# Patient Record
Sex: Male | Born: 1975 | Race: White | Hispanic: No | Marital: Single | Smoking: Current every day smoker
Health system: Southern US, Community
[De-identification: ages and names within clinical notes are randomized; demographics above are authoritative.]

## PROBLEM LIST (undated history)

## (undated) DIAGNOSIS — R55 Syncope and collapse: Secondary | ICD-10-CM

## (undated) DIAGNOSIS — I5022 Chronic systolic (congestive) heart failure: Secondary | ICD-10-CM

## (undated) DIAGNOSIS — D649 Anemia, unspecified: Secondary | ICD-10-CM

## (undated) DIAGNOSIS — I255 Ischemic cardiomyopathy: Secondary | ICD-10-CM

## (undated) DIAGNOSIS — Z91199 Patient's noncompliance with other medical treatment and regimen due to unspecified reason: Secondary | ICD-10-CM

## (undated) DIAGNOSIS — I2510 Atherosclerotic heart disease of native coronary artery without angina pectoris: Secondary | ICD-10-CM

## (undated) DIAGNOSIS — I219 Acute myocardial infarction, unspecified: Secondary | ICD-10-CM

## (undated) DIAGNOSIS — Z720 Tobacco use: Secondary | ICD-10-CM

## (undated) DIAGNOSIS — I513 Intracardiac thrombosis, not elsewhere classified: Secondary | ICD-10-CM

## (undated) DIAGNOSIS — Z9119 Patient's noncompliance with other medical treatment and regimen: Secondary | ICD-10-CM

## (undated) DIAGNOSIS — E785 Hyperlipidemia, unspecified: Secondary | ICD-10-CM

## (undated) DIAGNOSIS — I4891 Unspecified atrial fibrillation: Secondary | ICD-10-CM

## (undated) DIAGNOSIS — K219 Gastro-esophageal reflux disease without esophagitis: Secondary | ICD-10-CM

## (undated) DIAGNOSIS — I340 Nonrheumatic mitral (valve) insufficiency: Secondary | ICD-10-CM

## (undated) DIAGNOSIS — I209 Angina pectoris, unspecified: Secondary | ICD-10-CM

## (undated) DIAGNOSIS — I639 Cerebral infarction, unspecified: Secondary | ICD-10-CM

## (undated) HISTORY — DX: Cerebral infarction, unspecified: I63.9

## (undated) HISTORY — PX: FRACTURE SURGERY: SHX138

## (undated) HISTORY — PX: OTHER SURGICAL HISTORY: SHX169

## (undated) SURGERY — Surgical Case
Anesthesia: *Unknown

---
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
## 2004-07-04 HISTORY — PX: TIBIA FRACTURE SURGERY: SHX806

## 2012-01-22 ENCOUNTER — Inpatient Hospital Stay (HOSPITAL_COMMUNITY)
Admission: EM | Admit: 2012-01-22 | Discharge: 2012-01-25 | DRG: 247 | Disposition: A | Discharge status: Home / Self Care | Payer: Medicaid Other | Attending: Cardiovascular Disease | Admitting: Cardiovascular Disease

## 2012-01-22 ENCOUNTER — Encounter (HOSPITAL_COMMUNITY): Payer: Self-pay | Admitting: *Deleted

## 2012-01-22 ENCOUNTER — Encounter (HOSPITAL_COMMUNITY)
Admission: EM | Disposition: A | Discharge status: Home / Self Care | Payer: Self-pay | Source: Home / Self Care | Attending: Cardiovascular Disease

## 2012-01-22 DIAGNOSIS — R030 Elevated blood-pressure reading, without diagnosis of hypertension: Secondary | ICD-10-CM | POA: Diagnosis present

## 2012-01-22 DIAGNOSIS — Z955 Presence of coronary angioplasty implant and graft: Secondary | ICD-10-CM

## 2012-01-22 DIAGNOSIS — E785 Hyperlipidemia, unspecified: Secondary | ICD-10-CM

## 2012-01-22 DIAGNOSIS — I2109 ST elevation (STEMI) myocardial infarction involving other coronary artery of anterior wall: Principal | ICD-10-CM

## 2012-01-22 DIAGNOSIS — I2510 Atherosclerotic heart disease of native coronary artery without angina pectoris: Secondary | ICD-10-CM

## 2012-01-22 DIAGNOSIS — Z8249 Family history of ischemic heart disease and other diseases of the circulatory system: Secondary | ICD-10-CM

## 2012-01-22 DIAGNOSIS — IMO0001 Reserved for inherently not codable concepts without codable children: Secondary | ICD-10-CM

## 2012-01-22 DIAGNOSIS — F17200 Nicotine dependence, unspecified, uncomplicated: Secondary | ICD-10-CM | POA: Diagnosis present

## 2012-01-22 DIAGNOSIS — Z720 Tobacco use: Secondary | ICD-10-CM

## 2012-01-22 HISTORY — PX: CORONARY ANGIOPLASTY WITH STENT PLACEMENT: SHX49

## 2012-01-22 HISTORY — DX: Hyperlipidemia, unspecified: E78.5

## 2012-01-22 HISTORY — PX: LEFT HEART CATHETERIZATION WITH CORONARY ANGIOGRAM: SHX5451

## 2012-01-22 HISTORY — DX: Atherosclerotic heart disease of native coronary artery without angina pectoris: I25.10

## 2012-01-22 HISTORY — PX: PERCUTANEOUS CORONARY STENT INTERVENTION (PCI-S): SHX5485

## 2012-01-22 HISTORY — DX: Tobacco use: Z72.0

## 2012-01-22 HISTORY — DX: Gastro-esophageal reflux disease without esophagitis: K21.9

## 2012-01-22 LAB — CBC
Hemoglobin: 14.6 g/dL (ref 13.0–17.0)
MCH: 34.4 pg — ABNORMAL HIGH (ref 26.0–34.0)
MCV: 96.2 fL (ref 78.0–100.0)
Platelets: 290 10*3/uL (ref 150–400)
RBC: 4.24 MIL/uL (ref 4.22–5.81)
WBC: 8.7 10*3/uL (ref 4.0–10.5)

## 2012-01-22 SURGERY — LEFT HEART CATHETERIZATION WITH CORONARY ANGIOGRAM
Anesthesia: LOCAL

## 2012-01-22 MED ORDER — HEPARIN (PORCINE) IN NACL 2-0.9 UNIT/ML-% IJ SOLN
INTRAMUSCULAR | Status: AC
  Filled 2012-01-22: qty 2000

## 2012-01-22 MED ORDER — FENTANYL CITRATE 0.05 MG/ML IJ SOLN
INTRAMUSCULAR | Status: AC
  Filled 2012-01-22: qty 2

## 2012-01-22 MED ORDER — LIDOCAINE HCL (PF) 1 % IJ SOLN
INTRAMUSCULAR | Status: AC
  Filled 2012-01-22: qty 30

## 2012-01-22 MED ORDER — VERAPAMIL HCL 2.5 MG/ML IV SOLN
INTRAVENOUS | Status: AC
  Filled 2012-01-22: qty 2

## 2012-01-22 MED ORDER — HEPARIN SODIUM (PORCINE) 1000 UNIT/ML IJ SOLN
INTRAMUSCULAR | Status: AC
  Filled 2012-01-22: qty 1

## 2012-01-22 MED ORDER — BIVALIRUDIN 250 MG IV SOLR
INTRAVENOUS | Status: AC
  Filled 2012-01-22: qty 250

## 2012-01-22 MED ORDER — MIDAZOLAM HCL 2 MG/2ML IJ SOLN
INTRAMUSCULAR | Status: AC
  Filled 2012-01-22: qty 2

## 2012-01-22 MED ORDER — TICAGRELOR 90 MG PO TABS
ORAL_TABLET | ORAL | Status: AC
  Filled 2012-01-22: qty 2

## 2012-01-22 MED ORDER — NITROGLYCERIN 0.2 MG/ML ON CALL CATH LAB
INTRAVENOUS | Status: AC
  Filled 2012-01-22: qty 1

## 2012-01-22 NOTE — ED Notes (Signed)
Pt arrived by [employer]  for code stemi. Received 4 ASA, 2 NItro, fentanyl iv pta. Pt was working outside all day, mid chest pain started at 1430, radiates down left arm and into jaw.
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
## 2012-01-22 NOTE — ED Notes (Addendum)
Taken to cath lab with RN present.

## 2012-01-23 ENCOUNTER — Encounter (HOSPITAL_COMMUNITY): Payer: Self-pay

## 2012-01-23 DIAGNOSIS — I214 Non-ST elevation (NSTEMI) myocardial infarction: Secondary | ICD-10-CM

## 2012-01-23 LAB — CARDIAC PANEL(CRET KIN+CKTOT+MB+TROPI)
CK, MB: 458.6 ng/mL (ref 0.3–4.0)
CK, MB: 258.4 ng/mL (ref 0.3–4.0)
CK, MB: 134.8 ng/mL (ref 0.3–4.0)
Total CK: 4477 U/L — ABNORMAL HIGH (ref 7–232)
Troponin I: >20 ng/mL (ref <0.30)

## 2012-01-23 LAB — CBC
MCH: 34.4 pg — ABNORMAL HIGH (ref 26.0–34.0)
MCHC: 35.5 g/dL (ref 30.0–36.0)
Platelets: 260 10*3/uL (ref 150–400)

## 2012-01-23 LAB — POCT I-STAT, CHEM 8
Hemoglobin: 13.9 g/dL (ref 13.0–17.0)
Sodium: 126 mEq/L — ABNORMAL LOW (ref 135–145)
TCO2: 16 mmol/L (ref 0–100)

## 2012-01-23 LAB — LIPID PANEL
Cholesterol: 183 mg/dL (ref 0–200)
Triglycerides: 87 mg/dL (ref <150)
VLDL: 17 mg/dL (ref 0–40)

## 2012-01-23 LAB — BASIC METABOLIC PANEL
BUN: 12 mg/dL (ref 6–23)
Calcium: 8.4 mg/dL (ref 8.4–10.5)
GFR calc Af Amer: >90 mL/min (ref >90)
GFR calc non Af Amer: >90 mL/min (ref >90)
Glucose, Bld: 122 mg/dL — ABNORMAL HIGH (ref 70–99)
Sodium: 137 mEq/L (ref 135–145)

## 2012-01-23 LAB — POCT ACTIVATED CLOTTING TIME: Activated Clotting Time: 359 seconds

## 2012-01-23 LAB — MRSA PCR SCREENING: MRSA by PCR: NEGATIVE

## 2012-01-23 MED ORDER — MORPHINE SULFATE 2 MG/ML IJ SOLN: 1.0000 mg | INTRAMUSCULAR | Status: DC | PRN

## 2012-01-23 MED ORDER — NITROGLYCERIN 0.4 MG SL SUBL: 0.4000 mg | SUBLINGUAL_TABLET | SUBLINGUAL | Status: DC | PRN

## 2012-01-23 MED ORDER — SODIUM CHLORIDE 0.9 % IV SOLN: INTRAVENOUS | Status: DC

## 2012-01-23 MED ORDER — ASPIRIN 81 MG PO CHEW
81.0000 mg | CHEWABLE_TABLET | Freq: Every day | ORAL | Status: DC
  Administered 2012-01-23 – 2012-01-25 (×3): 81 mg via ORAL
  Filled 2012-01-23 (×3): qty 1

## 2012-01-23 MED ORDER — ALPRAZOLAM 0.25 MG PO TABS: 0.2500 mg | ORAL_TABLET | Freq: Two times a day (BID) | ORAL | Status: DC | PRN

## 2012-01-23 MED ORDER — ATORVASTATIN CALCIUM 80 MG PO TABS
80.0000 mg | ORAL_TABLET | Freq: Every day | ORAL | Status: DC
  Administered 2012-01-23 – 2012-01-24 (×2): 80 mg via ORAL
  Filled 2012-01-23 (×3): qty 1

## 2012-01-23 MED ORDER — OXYCODONE-ACETAMINOPHEN 5-325 MG PO TABS: 1.0000 | ORAL_TABLET | ORAL | Status: DC | PRN

## 2012-01-23 MED ORDER — TICAGRELOR 90 MG PO TABS
90.0000 mg | ORAL_TABLET | Freq: Two times a day (BID) | ORAL | Status: DC
  Administered 2012-01-23 – 2012-01-25 (×5): 90 mg via ORAL
  Filled 2012-01-23 (×6): qty 1

## 2012-01-23 MED FILL — Dextrose Inj 5%: INTRAVENOUS | Qty: 50 | Status: AC

## 2012-01-23 NOTE — Progress Notes (Signed)
Utilization Review Completed.  Logan [name] T  01/23/2012  

## 2012-01-23 NOTE — Progress Notes (Signed)
CRITICAL VALUE ALERT  Critical value received:  Ckmb, troponin  Date of notification:  01/23/12  Time of notification:  0420  Critical value read back:yes  Nurse who received alert:  Lonny Prude   MD notified (1st page):  Dr Abelardo Diesel  Time of first page:  0430  MD notified (2nd page):  Time of second page:  Responding MD:    Time MD responded:
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
## 2012-01-23 NOTE — Progress Notes (Signed)
CARDIAC REHAB PHASE I   PRE:  Rate/Rhythm: 92 SR    BP: sitting 99/69    SaO2: 98 2L, 97 RA  MODE:  Ambulation: 190 ft   POST:  Rate/Rhythm: 102 ST    BP: sitting 101/61     SaO2: 98 RA  Pt sts he is ready to walk/get OOB. No VT in last 2 hrs, pt feels good. Vss. No c/o walking short distance. To recliner. Began ed with pt. 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 [phone]  Harriet Masson CES, ACSM

## 2012-01-24 LAB — DRUGS OF ABUSE SCREEN W/O ALC, ROUTINE URINE
Amphetamine Screen, Ur: NEGATIVE
Barbiturate Quant, Ur: NEGATIVE
Benzodiazepines.: NEGATIVE
Marijuana Metabolite: NEGATIVE
Methadone: NEGATIVE
Opiate Screen, Urine: NEGATIVE

## 2012-01-24 LAB — CARDIAC PANEL(CRET KIN+CKTOT+MB+TROPI)
CK, MB: 67.6 ng/mL (ref 0.3–4.0)
Relative Index: 6 — ABNORMAL HIGH (ref 0.0–2.5)
Total CK: 1125 U/L — ABNORMAL HIGH (ref 7–232)
Troponin I: >20 ng/mL (ref <0.30)

## 2012-01-24 MED FILL — Sodium Chloride IV Soln 0.9%: INTRAVENOUS | Qty: 1000 | Status: AC

## 2012-01-24 NOTE — Progress Notes (Signed)
CARDIAC REHAB PHASE I   PRE:  Rate/Rhythm: 76SR  BP:  Supine: 96/60  Sitting:   Standing:    SaO2: 98%RA  MODE:  Ambulation: 550 ft   POST:  Rate/Rhythem: 92SR  BP:  Supine:   Sitting: 100/60  Standing:    SaO2: 100%RA 1430-1505 Pt walked 550 ft with steady gait. Denied chest pain or dizziness. Education completed. Permission given to refer to [facility] Phase 2. Reenforced smoking cessation. Discussed watching sodium with low EF.  Duanne Limerick

## 2012-01-24 NOTE — Care Management Note (Signed)
    Page 1 of 1   01/24/2012     3:39:47 PM   CARE MANAGEMENT NOTE 01/24/2012  Patient:  Logan [name], Logan [name]   Account Number:  [id number]  Date Initiated:  01/24/2012  Documentation initiated by:  Alvira Philips Assessment:   36 yr-old male adm with dx of STEMI; lives alone, independent PTA     Action/Plan:   Anticipated DC Date:  01/25/2012   Anticipated DC Plan:  HOME/SELF CARE      DC Planning Services  CM consult  Medication Assistance  PCP issues      Choice offered to / List presented to:             Status of service:  Completed, signed off Medicare Important Message given?   (If response is "NO", the following Medicare IM given date fields will be blank) Date Medicare IM given:   Date Additional Medicare IM given:    Discharge Disposition:  HOME/SELF CARE  Per UR Regulation:  Reviewed for med. necessity/level of care/duration of stay  If discussed at Long Length of Stay Meetings, dates discussed:    Comments:  No PCP  01-24-12 1534 Tomi Bamberger, RN,BSN [phone] CM did call [facility] and medication is available . CM placed [place] and [place] form in chart. MD please fill out. No further services from CM at this time.  01/24/12 0920 Henrietta Mayo RN MSN CCM Pt is self-pay and is to d/c on Brilinta.  Provided card for free 30-day supply of Brilinta and application for pharmaceutical patient assistance program.  Pt will establish @ [facility] Dept for primary care - provided address and telephone number as well as number for assistance with medications.
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
## 2012-01-25 ENCOUNTER — Encounter (HOSPITAL_COMMUNITY): Payer: Self-pay | Admitting: Physician Assistant

## 2012-01-25 DIAGNOSIS — I2510 Atherosclerotic heart disease of native coronary artery without angina pectoris: Secondary | ICD-10-CM

## 2012-01-25 DIAGNOSIS — E785 Hyperlipidemia, unspecified: Secondary | ICD-10-CM

## 2012-01-25 MED ORDER — CARVEDILOL 3.125 MG PO TABS: 3.1250 mg | ORAL_TABLET | Freq: Two times a day (BID) | ORAL | Status: DC

## 2012-01-25 MED ORDER — ASPIRIN 81 MG PO CHEW: 81.0000 mg | CHEWABLE_TABLET | Freq: Every day | ORAL | Status: DC

## 2012-01-25 MED ORDER — TICAGRELOR 90 MG PO TABS: 90.0000 mg | ORAL_TABLET | Freq: Two times a day (BID) | ORAL | Status: DC

## 2012-01-25 MED ORDER — CARVEDILOL 3.125 MG PO TABS
3.1250 mg | ORAL_TABLET | Freq: Two times a day (BID) | ORAL | Status: DC
  Filled 2012-01-25 (×2): qty 1

## 2012-01-25 MED ORDER — NITROGLYCERIN 0.4 MG SL SUBL: 0.4000 mg | SUBLINGUAL_TABLET | SUBLINGUAL | Status: DC | PRN

## 2012-01-25 MED ORDER — ATORVASTATIN CALCIUM 80 MG PO TABS: 80.0000 mg | ORAL_TABLET | Freq: Every day | ORAL | Status: DC
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
## 2012-02-03 ENCOUNTER — Encounter: Payer: Self-pay | Admitting: Nurse Practitioner

## 2012-02-06 ENCOUNTER — Telehealth: Payer: Self-pay | Admitting: Nurse Practitioner

## 2012-02-06 NOTE — Telephone Encounter (Signed)
I received a phone call from Dr. Verdell Carmine in [place], [place] (cardiology). He called to give an update on the referenced patient. He was in [place] visiting friends and had recurrent chest pain. He thrombosed his stent that was just placed. EF is now down to 10%. He said he was taking his Brilinta but this may be questionable. He is still smoking. BP still too soft for ACE. Will see him on Friday as planned this week. Will need to give consideration for ICD 3 months post MI.

## 2012-02-10 ENCOUNTER — Encounter: Payer: Self-pay | Admitting: Nurse Practitioner

## 2012-02-15 ENCOUNTER — Encounter: Payer: Self-pay | Admitting: Nurse Practitioner

## 2012-02-15 ENCOUNTER — Ambulatory Visit (INDEPENDENT_AMBULATORY_CARE_PROVIDER_SITE_OTHER): Payer: Self-pay | Admitting: Nurse Practitioner

## 2012-02-15 VITALS — BP 96/70 | HR 77 | Ht 68.0 in | Wt 155.8 lb

## 2012-02-15 DIAGNOSIS — E785 Hyperlipidemia, unspecified: Secondary | ICD-10-CM

## 2012-02-15 DIAGNOSIS — F17200 Nicotine dependence, unspecified, uncomplicated: Secondary | ICD-10-CM

## 2012-02-15 DIAGNOSIS — Z720 Tobacco use: Secondary | ICD-10-CM

## 2012-02-15 DIAGNOSIS — I2510 Atherosclerotic heart disease of native coronary artery without angina pectoris: Secondary | ICD-10-CM

## 2012-02-15 DIAGNOSIS — IMO0001 Reserved for inherently not codable concepts without codable children: Secondary | ICD-10-CM

## 2012-02-15 DIAGNOSIS — R030 Elevated blood-pressure reading, without diagnosis of hypertension: Secondary | ICD-10-CM

## 2012-02-15 MED ORDER — PRAVASTATIN SODIUM 40 MG PO TABS: 40.0000 mg | ORAL_TABLET | Freq: Every evening | ORAL | Status: DC

## 2012-02-15 NOTE — Assessment & Plan Note (Addendum)
His BP is still too soft. Will just continue the low dose beta blocker. Hope to add ACE in the future. Repeat BP by me was only 90/70.

## 2012-02-15 NOTE — Assessment & Plan Note (Signed)
Cessation is strongly encouraged 

## 2012-02-15 NOTE — Assessment & Plan Note (Signed)
Patient has had recent STEMI with reinfarction due to thrombosis of his stent. EF is now down to 10%. He is still smoking. He is strongly encouraged to stop. We have discussed the significance of this diagnosis. May need ICD once we are 3 months out from his event. Samples of Brilinta are given today. I have switched him over to Pravachol 40 mg. He should be able to afford, especially since he is able to buy cigarettes. We will see him back later this month as planned with Dr. Elease Hashimoto. Patient is agreeable to this plan and will call if any problems develop in the interim.

## 2012-02-15 NOTE — Assessment & Plan Note (Signed)
I have switched him to Pravachol.

## 2012-02-15 NOTE — Patient Instructions (Addendum)
Start Pravachol 40 mg each night for your cholesterol  Stay on your other medicines  We will see you back on the 26th of August with Dr. Elease Hashimoto  Minimize your salt  We will consider a sleep study  Don't smoke  Call the [facility] Care office at [phone] if you have any questions, problems or concerns.
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
## 2012-02-27 ENCOUNTER — Encounter: Payer: Self-pay | Admitting: Cardiovascular Disease

## 2012-04-05 ENCOUNTER — Encounter: Payer: Self-pay | Admitting: Cardiovascular Disease

## 2012-04-05 ENCOUNTER — Ambulatory Visit (INDEPENDENT_AMBULATORY_CARE_PROVIDER_SITE_OTHER): Payer: Self-pay | Admitting: Cardiovascular Disease

## 2012-04-05 VITALS — BP 102/72 | HR 60 | Ht 68.0 in | Wt 152.4 lb

## 2012-04-05 DIAGNOSIS — I2510 Atherosclerotic heart disease of native coronary artery without angina pectoris: Secondary | ICD-10-CM

## 2012-04-05 DIAGNOSIS — I509 Heart failure, unspecified: Secondary | ICD-10-CM

## 2012-04-05 DIAGNOSIS — I5022 Chronic systolic (congestive) heart failure: Secondary | ICD-10-CM

## 2012-04-05 DIAGNOSIS — I2109 ST elevation (STEMI) myocardial infarction involving other coronary artery of anterior wall: Secondary | ICD-10-CM

## 2012-04-05 NOTE — Assessment & Plan Note (Signed)
Episodes of followup. He presented in July with an acute anterior wall myocardial infarction and was found to also have had an old inferior wall myocardial infarction. He had a stent placed at that time. Unfortunately he had subacute thrombosis and a second myocardial infarction while he was visiting in [place]. He had reexpansion of the stent. His ejection fraction at that time was 10-15%.  He's had a lot of symptoms of congestive heart failure since that time. He has significant shortness breath when he is outlined above along.  He's not been taking his Pravachol because he has not been to the doctor.  Quaveon has significant congestive heart failure and will likely need the resources available in the Advanced Heart Failure clinic. We will refer him to Dr. Gala Romney.  We'll get an echocardiogram the next several days.
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
## 2012-04-05 NOTE — Patient Instructions (Signed)
Your physician has requested that you have an echocardiogram. Echocardiography is a painless test that uses sound waves to create images of your heart. It provides your doctor with information about the size and shape of your heart and how well your heart's chambers and valves are working. This procedure takes approximately one hour. There are no restrictions for this procedure.  You have been referred to Dr Teressa Lower for Heart Failure Clinic.

## 2012-04-06 DIAGNOSIS — I5022 Chronic systolic (congestive) heart failure: Secondary | ICD-10-CM | POA: Insufficient documentation

## 2012-04-06 NOTE — Assessment & Plan Note (Signed)
Logan [name] has developed chronic systolic congestive heart failure. This is due to ischemic cardiomyopathy. He has had an anterior wall myocardial infarction with stenting of his LAD. Unfortunately, he had a subacute thrombosis of his LAD stent requiring repeat PCI when he was on vacation in [place].  According to him, his ejection fraction is now 10-50%.  We'll get an echocardiogram for further evaluation of his LV function. We had a long discussion about his medications. I've stressed to him how important it is related take his medications. He has had some problems affording his medications but I did point out that he still smokes cigarettes. I specifically told him that I suspected that he could afford his medications that he stop smoking.  I think that he will need the assistance of our advanced heart failure clinic. We will refer him to the heart failure clinic.

## 2012-04-12 ENCOUNTER — Other Ambulatory Visit (HOSPITAL_COMMUNITY): Payer: Self-pay

## 2012-04-22 ENCOUNTER — Encounter (HOSPITAL_COMMUNITY): Payer: Self-pay | Admitting: Emergency Medicine

## 2012-04-22 ENCOUNTER — Emergency Department (HOSPITAL_COMMUNITY): Payer: Medicaid Other

## 2012-04-22 ENCOUNTER — Inpatient Hospital Stay (HOSPITAL_COMMUNITY)
Admission: EM | Admit: 2012-04-22 | Discharge: 2012-04-27 | DRG: 312 | Disposition: A | Discharge status: Home / Self Care | Payer: Medicaid Other | Attending: Cardiovascular Disease | Admitting: Cardiovascular Disease

## 2012-04-22 ENCOUNTER — Observation Stay (HOSPITAL_COMMUNITY)
Admission: AD | Admit: 2012-04-22 | Discharge: 2012-04-22 | Disposition: A | Discharge status: Home / Self Care | Payer: Self-pay | Source: Ambulatory Visit | Attending: Interventional Cardiology | Admitting: Interventional Cardiology

## 2012-04-22 DIAGNOSIS — I2589 Other forms of chronic ischemic heart disease: Secondary | ICD-10-CM | POA: Diagnosis present

## 2012-04-22 DIAGNOSIS — E785 Hyperlipidemia, unspecified: Secondary | ICD-10-CM | POA: Diagnosis present

## 2012-04-22 DIAGNOSIS — I255 Ischemic cardiomyopathy: Secondary | ICD-10-CM | POA: Diagnosis present

## 2012-04-22 DIAGNOSIS — I2582 Chronic total occlusion of coronary artery: Secondary | ICD-10-CM | POA: Diagnosis present

## 2012-04-22 DIAGNOSIS — Z79899 Other long term (current) drug therapy: Secondary | ICD-10-CM

## 2012-04-22 DIAGNOSIS — Z7982 Long term (current) use of aspirin: Secondary | ICD-10-CM

## 2012-04-22 DIAGNOSIS — I513 Intracardiac thrombosis, not elsewhere classified: Secondary | ICD-10-CM | POA: Diagnosis present

## 2012-04-22 DIAGNOSIS — I5022 Chronic systolic (congestive) heart failure: Secondary | ICD-10-CM

## 2012-04-22 DIAGNOSIS — Z9119 Patient's noncompliance with other medical treatment and regimen: Secondary | ICD-10-CM

## 2012-04-22 DIAGNOSIS — R55 Syncope and collapse: Secondary | ICD-10-CM

## 2012-04-22 DIAGNOSIS — I252 Old myocardial infarction: Secondary | ICD-10-CM

## 2012-04-22 DIAGNOSIS — I5189 Other ill-defined heart diseases: Secondary | ICD-10-CM | POA: Diagnosis present

## 2012-04-22 DIAGNOSIS — Z23 Encounter for immunization: Secondary | ICD-10-CM

## 2012-04-22 DIAGNOSIS — I509 Heart failure, unspecified: Secondary | ICD-10-CM | POA: Diagnosis present

## 2012-04-22 DIAGNOSIS — F17200 Nicotine dependence, unspecified, uncomplicated: Secondary | ICD-10-CM | POA: Diagnosis present

## 2012-04-22 DIAGNOSIS — I059 Rheumatic mitral valve disease, unspecified: Secondary | ICD-10-CM

## 2012-04-22 DIAGNOSIS — R079 Chest pain, unspecified: Secondary | ICD-10-CM

## 2012-04-22 DIAGNOSIS — I519 Heart disease, unspecified: Secondary | ICD-10-CM

## 2012-04-22 DIAGNOSIS — K219 Gastro-esophageal reflux disease without esophagitis: Secondary | ICD-10-CM | POA: Diagnosis present

## 2012-04-22 DIAGNOSIS — Z720 Tobacco use: Secondary | ICD-10-CM | POA: Diagnosis present

## 2012-04-22 DIAGNOSIS — Z91199 Patient's noncompliance with other medical treatment and regimen due to unspecified reason: Secondary | ICD-10-CM

## 2012-04-22 DIAGNOSIS — I2510 Atherosclerotic heart disease of native coronary artery without angina pectoris: Secondary | ICD-10-CM

## 2012-04-22 DIAGNOSIS — F1010 Alcohol abuse, uncomplicated: Secondary | ICD-10-CM | POA: Diagnosis present

## 2012-04-22 HISTORY — DX: Chronic systolic (congestive) heart failure: I50.22

## 2012-04-22 HISTORY — DX: Patient's noncompliance with other medical treatment and regimen: Z91.19

## 2012-04-22 HISTORY — DX: Syncope and collapse: R55

## 2012-04-22 HISTORY — DX: Ischemic cardiomyopathy: I25.5

## 2012-04-22 HISTORY — DX: Intracardiac thrombosis, not elsewhere classified: I51.3

## 2012-04-22 HISTORY — DX: Acute myocardial infarction, unspecified: I21.9

## 2012-04-22 HISTORY — DX: Patient's noncompliance with other medical treatment and regimen due to unspecified reason: Z91.199

## 2012-04-22 LAB — POCT I-STAT, CHEM 8
BUN: 4 mg/dL — ABNORMAL LOW (ref 6–23)
Calcium, Ion: 1.16 mmol/L (ref 1.12–1.23)
Chloride: 109 mEq/L (ref 96–112)
Creatinine, Ser: 1.1 mg/dL (ref 0.50–1.35)
Glucose, Bld: 80 mg/dL (ref 70–99)
TCO2: 18 mmol/L (ref 0–100)

## 2012-04-22 LAB — CBC WITH DIFFERENTIAL/PLATELET
HCT: 37.6 % — ABNORMAL LOW (ref 39.0–52.0)
Hemoglobin: 13.3 g/dL (ref 13.0–17.0)
Lymphs Abs: 1.9 10*3/uL (ref 0.7–4.0)
Monocytes Absolute: 0.6 10*3/uL (ref 0.1–1.0)
Monocytes Relative: 5 % (ref 3–12)
Neutro Abs: 8 10*3/uL — ABNORMAL HIGH (ref 1.7–7.7)
Neutrophils Relative %: 75 % (ref 43–77)
RBC: 4 MIL/uL — ABNORMAL LOW (ref 4.22–5.81)

## 2012-04-22 LAB — BASIC METABOLIC PANEL
BUN: 6 mg/dL (ref 6–23)
CO2: 19 mEq/L (ref 19–32)
Chloride: 106 mEq/L (ref 96–112)
Creatinine, Ser: 0.87 mg/dL (ref 0.50–1.35)
GFR calc Af Amer: >90 mL/min (ref >90)
Glucose, Bld: 81 mg/dL (ref 70–99)
Potassium: 3.7 mEq/L (ref 3.5–5.1)

## 2012-04-22 LAB — POCT I-STAT TROPONIN I: Troponin i, poc: 0.11 ng/mL (ref 0.00–0.08)

## 2012-04-22 LAB — TROPONIN I
Troponin I: <0.3 ng/mL (ref <0.30)
Troponin I: <0.3 ng/mL (ref <0.30)

## 2012-04-22 LAB — ETHANOL: Alcohol, Ethyl (B): 43 mg/dL — ABNORMAL HIGH (ref 0–11)

## 2012-04-22 MED ORDER — PNEUMOCOCCAL VAC POLYVALENT 25 MCG/0.5ML IJ INJ
0.5000 mL | INJECTION | INTRAMUSCULAR | Status: AC
  Administered 2012-04-23: 0.5 mL via INTRAMUSCULAR
  Filled 2012-04-22: qty 0.5

## 2012-04-22 MED ORDER — HEPARIN BOLUS VIA INFUSION
1500.0000 [IU] | Freq: Once | INTRAVENOUS | Status: AC
  Administered 2012-04-22: 1500 [IU] via INTRAVENOUS
  Filled 2012-04-22: qty 1500

## 2012-04-22 MED ORDER — ASPIRIN 81 MG PO CHEW: 324.0000 mg | CHEWABLE_TABLET | ORAL | Status: AC

## 2012-04-22 MED ORDER — TICAGRELOR 90 MG PO TABS
90.0000 mg | ORAL_TABLET | Freq: Two times a day (BID) | ORAL | Status: DC
  Administered 2012-04-22 – 2012-04-27 (×11): 90 mg via ORAL
  Filled 2012-04-22 (×12): qty 1

## 2012-04-22 MED ORDER — ONDANSETRON HCL 4 MG/2ML IJ SOLN: 4.0000 mg | Freq: Four times a day (QID) | INTRAMUSCULAR | Status: DC | PRN

## 2012-04-22 MED ORDER — ASPIRIN 300 MG RE SUPP
300.0000 mg | RECTAL | Status: AC
  Filled 2012-04-22: qty 1

## 2012-04-22 MED ORDER — ACETAMINOPHEN 325 MG PO TABS: 650.0000 mg | ORAL_TABLET | ORAL | Status: DC | PRN

## 2012-04-22 MED ORDER — CARVEDILOL 3.125 MG PO TABS
3.1250 mg | ORAL_TABLET | Freq: Two times a day (BID) | ORAL | Status: DC
  Administered 2012-04-22 – 2012-04-27 (×10): 3.125 mg via ORAL
  Filled 2012-04-22 (×13): qty 1

## 2012-04-22 MED ORDER — HEPARIN (PORCINE) IN NACL 100-0.45 UNIT/ML-% IJ SOLN
1550.0000 [IU]/h | INTRAMUSCULAR | Status: DC
  Administered 2012-04-22: 1050 [IU]/h via INTRAVENOUS
  Administered 2012-04-23: 1350 [IU]/h via INTRAVENOUS
  Filled 2012-04-22 (×4): qty 250

## 2012-04-22 MED ORDER — ENOXAPARIN SODIUM 40 MG/0.4ML ~~LOC~~ SOLN
40.0000 mg | SUBCUTANEOUS | Status: DC
  Administered 2012-04-22: 40 mg via SUBCUTANEOUS
  Filled 2012-04-22: qty 0.4

## 2012-04-22 MED ORDER — NITROGLYCERIN 0.4 MG SL SUBL: 0.4000 mg | SUBLINGUAL_TABLET | SUBLINGUAL | Status: DC | PRN

## 2012-04-22 MED ORDER — SIMVASTATIN 40 MG PO TABS
40.0000 mg | ORAL_TABLET | Freq: Every day | ORAL | Status: DC
  Administered 2012-04-22 – 2012-04-27 (×6): 40 mg via ORAL
  Filled 2012-04-22 (×6): qty 1

## 2012-04-22 MED ORDER — ASPIRIN EC 81 MG PO TBEC
81.0000 mg | DELAYED_RELEASE_TABLET | Freq: Every day | ORAL | Status: DC
  Filled 2012-04-22: qty 1

## 2012-04-22 NOTE — ED Notes (Signed)
PER EMS: Patient found by family in bathroom. Pt was initially unresponsive. Pt became responsive in route. Given 325 aspirin via EMS. Patient cool, pale, and diaphoretic. Pt alert but lethargic at this time. Patient rates chest pain 1/10. NAD

## 2012-04-22 NOTE — Progress Notes (Addendum)
Echo with severe LV dysfunction.  There appears to be a mural thrombus along distal inferior/posterior wall. Patient is currently on ASA and Brilinta Will begin IV heparin. Decision will need to be made for long term anticoag (patient with noncompliance history, also ETOH use) High risk. Note that EtOH level on admit was 43.  Discussed with EP  May explain event of last night.
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
## 2012-04-22 NOTE — Progress Notes (Signed)
Subjective: No CP  No SOB  No dizziness Objective: Filed Vitals:   04/22/12 0300 04/22/12 0430 04/22/12 0459 04/22/12 0541  BP: 86/56 87/54 91/60  96/60  Pulse: 73 67 69 75  Temp:   97.7 F (36.5 C) 97.5 F (36.4 C)  TempSrc:   Oral Oral  Resp: 16 17 18 18   Height:    5\' 8"  (1.727 m)  SpO2: 100% 100% 100% 100%   Weight change:  No intake or output data in the 24 hours ending 04/22/12 0755  General: Alert, awake, oriented x3, in no acute distress Neck:  JVP is normal Heart: Regular rate and rhythm, without murmurs, rubs, gallops.  Lungs: Clear to auscultation.  No rales or wheezes. Exemities:  No edema.   Neuro: Grossly intact, nonfocal.  Tele:  SR   Lab Results: Results for orders placed during the hospital encounter of 04/22/12 (from the past 24 hour(s))  ETHANOL     Status: Abnormal   Collection Time   04/22/12  3:16 AM      Component Value Range   Alcohol, Ethyl (B) 43 (*) 0 - 11 mg/dL  TROPONIN I     Status: Normal   Collection Time   04/22/12  3:16 AM      Component Value Range   Troponin I <0.30  <0.30 ng/mL  CBC WITH DIFFERENTIAL     Status: Abnormal   Collection Time   04/22/12  3:16 AM      Component Value Range   WBC 10.6 (*) 4.0 - 10.5 K/uL   RBC 4.00 (*) 4.22 - 5.81 MIL/uL   Hemoglobin 13.3  13.0 - 17.0 g/dL   HCT 16.1 (*) 09.6 - 04.5 %   MCV 94.0  78.0 - 100.0 fL   MCH 33.3  26.0 - 34.0 pg   MCHC 35.4  30.0 - 36.0 g/dL   RDW 40.9  81.1 - 91.4 %   Platelets 222  150 - 400 K/uL   Neutrophils Relative 75  43 - 77 %   Neutro Abs 8.0 (*) 1.7 - 7.7 K/uL   Lymphocytes Relative 18  12 - 46 %   Lymphs Abs 1.9  0.7 - 4.0 K/uL   Monocytes Relative 5  3 - 12 %   Monocytes Absolute 0.6  0.1 - 1.0 K/uL   Eosinophils Relative 1  0 - 5 %   Eosinophils Absolute 0.1  0.0 - 0.7 K/uL   Basophils Relative 0  0 - 1 %   Basophils Absolute 0.0  0.0 - 0.1 K/uL  BASIC METABOLIC PANEL     Status: Normal   Collection Time   04/22/12  3:16 AM      Component Value Range     Sodium 138  135 - 145 mEq/L   Potassium 3.7  3.5 - 5.1 mEq/L   Chloride 106  96 - 112 mEq/L   CO2 19  19 - 32 mEq/L   Glucose, Bld 81  70 - 99 mg/dL   BUN 6  6 - 23 mg/dL   Creatinine, Ser 7.82  0.50 - 1.35 mg/dL   Calcium 8.9  8.4 - 95.6 mg/dL   GFR calc non Af Amer >90  >90 mL/min   GFR calc Af Amer >90  >90 mL/min  POCT I-STAT TROPONIN I     Status: Abnormal   Collection Time   04/22/12  3:23 AM      Component Value Range   Troponin i, poc 0.11 (*) 0.00 -  0.08 ng/mL   Comment NOTIFIED PHYSICIAN     Comment 3           POCT I-STAT, CHEM 8     Status: Abnormal   Collection Time   04/22/12  3:25 AM      Component Value Range   Sodium 143  135 - 145 mEq/L   Potassium 3.8  3.5 - 5.1 mEq/L   Chloride 109  96 - 112 mEq/L   BUN 4 (*) 6 - 23 mg/dL   Creatinine, Ser 1.61  0.50 - 1.35 mg/dL   Glucose, Bld 80  70 - 99 mg/dL   Calcium, Ion 0.96  0.45 - 1.23 mmol/L   TCO2 18  0 - 100 mmol/L   Hemoglobin 12.9 (*) 13.0 - 17.0 g/dL   HCT 40.9 (*) 81.1 - 91.4 %  TROPONIN I     Status: Normal   Collection Time   04/22/12  6:40 AM      Component Value Range   Troponin I <0.30  <0.30 ng/mL    Studies/Results: @[id number] @  Medications: Reviewed   Patient Active Hospital Problem List: 1.  Ischemic cardiomyopathy.  Patient suffered an anterior MI on January 22, 2012 here at [facility] and undergoing PCI to the pLAD. At that time his RCA was found to also be chronically occluded with collaterals from the left circumflex. Shortly thereafter, on 02/05/2012 he suffered a subacute stent thrombosis while in [place] and underwent repeat PCI to the pLAD. His LVEF was reportedly 10-15% after this MI.  On coreg.  Not on ACE I  BP marginal.   2.  Question syncope.  Events of last evening were unwitnessed.  Patient reports feeling fine yesterday.  No dizziness.  No CP  No palpitations.  Was going to BR  No straining.  Next thing remembers was in ambulance. WIll review with EP  Keep on  tele.   3.  HL  Keep on statin.  LOS: 0 days   Dietrich Pates 04/22/2012, 7:55 AM

## 2012-04-22 NOTE — Progress Notes (Signed)
  Echocardiogram 2D Echocardiogram has been performed.  Logan [name] 04/22/2012, 2:55 PM

## 2012-04-23 DIAGNOSIS — I2510 Atherosclerotic heart disease of native coronary artery without angina pectoris: Secondary | ICD-10-CM

## 2012-04-23 DIAGNOSIS — R079 Chest pain, unspecified: Secondary | ICD-10-CM

## 2012-04-23 DIAGNOSIS — R55 Syncope and collapse: Principal | ICD-10-CM

## 2012-04-23 LAB — CBC
Hemoglobin: 12.1 g/dL — ABNORMAL LOW (ref 13.0–17.0)
MCH: 32.9 pg (ref 26.0–34.0)
MCV: 94.6 fL (ref 78.0–100.0)
RBC: 3.68 MIL/uL — ABNORMAL LOW (ref 4.22–5.81)

## 2012-04-23 LAB — HEPARIN LEVEL (UNFRACTIONATED): Heparin Unfractionated: 0.25 IU/mL — ABNORMAL LOW (ref 0.30–0.70)

## 2012-04-23 MED ORDER — PATIENT'S GUIDE TO USING COUMADIN BOOK
Freq: Once | Status: AC
  Administered 2012-04-23: 18:00:00
  Filled 2012-04-23: qty 1

## 2012-04-23 MED ORDER — HEPARIN BOLUS VIA INFUSION
2000.0000 [IU] | Freq: Once | INTRAVENOUS | Status: AC
  Administered 2012-04-23: 2000 [IU] via INTRAVENOUS
  Filled 2012-04-23: qty 2000

## 2012-04-23 MED ORDER — WARFARIN SODIUM 5 MG PO TABS
5.0000 mg | ORAL_TABLET | Freq: Every day | ORAL | Status: DC
  Administered 2012-04-23: 5 mg via ORAL
  Filled 2012-04-23 (×2): qty 1

## 2012-04-23 MED ORDER — HEPARIN (PORCINE) IN NACL 100-0.45 UNIT/ML-% IJ SOLN
2000.0000 [IU]/h | INTRAMUSCULAR | Status: DC
  Administered 2012-04-23: 1650 [IU]/h via INTRAVENOUS
  Administered 2012-04-24 – 2012-04-26 (×5): 1850 [IU]/h via INTRAVENOUS
  Administered 2012-04-27: 2000 [IU]/h via INTRAVENOUS
  Filled 2012-04-23 (×11): qty 250

## 2012-04-23 MED ORDER — WARFARIN - PHARMACIST DOSING INPATIENT
Freq: Every day | Status: DC
  Administered 2012-04-24 – 2012-04-26 (×2)

## 2012-04-23 NOTE — Progress Notes (Signed)
PROGRESS NOTE  Subjective:   Logan [name] is a 36 yo with hx of CAD, noncompliance, smoking who has had 2 Myocardial infactions this year resulting in an ischemic CM and EF of 10-15%.  He presented to the hospital after falling in the bathroom.  He had been drinking alcohol.  He has been noncompliant with his medications.  Objective:    Vital Signs:   Temp:  [97 F (36.1 C)-97.9 F (36.6 C)] 97 F (36.1 C) (10/21 0450) Pulse Rate:  [64-70] 64  (10/21 0450) Resp:  [18] 18  (10/21 0450) BP: (82-98)/(45-58) 98/58 mmHg (10/21 0450) SpO2:  [96 %-98 %] 97 % (10/21 0450) Weight:  [152 lb 6.4 oz (69.128 kg)-154 lb 1.6 oz (69.899 kg)] 154 lb 1.6 oz (69.899 kg) (10/21 0450)  Last BM Date: 04/22/12   24-hour weight change: Weight change:   Weight trends: Filed Weights   04/22/12 1430 04/23/12 0450  Weight: 152 lb 6.4 oz (69.128 kg) 154 lb 1.6 oz (69.899 kg)    Intake/Output:  10/20 0701 - 10/21 0700 In: 1231.9 [P.O.:1140; I.V.:91.9] Out: 301 [Urine:301]     Physical Exam: BP 98/58  Pulse 64  Temp 97 F (36.1 C) (Oral)  Resp 18  Ht 5\' 8"  (1.727 m)  Wt 154 lb 1.6 oz (69.899 kg)  BMI 23.43 kg/m2  SpO2 97%  General: Vital signs reviewed and noted.   Head: Normocephalic, atraumatic.  Eyes: conjunctivae/corneas clear.  EOM's intact.   Throat: normal  Neck: Supple. Normal carotids. No JVD  Lungs:  Wheezes and rhonchi that partially clear with deep breaths.  Heart: Regular rate,  With normal  S1 S2. No murmurs, gallops or rubs  Abdomen:  Soft, non-tender, non-distended with normoactive bowel sounds. No hepatomegaly. No rebound/guarding. No abdominal masses.  Extremities: Distal pedal pulses are 2+ .  No edema.    Neurologic: A&O X3, CN II - XII are grossly intact. Motor strength is 5/5 in the all 4 extremities.  Psych: Responds to questions appropriately with normal affect.    Labs: BMET:  Basename 04/22/12 0325 04/22/12 0316  NA 143 138  K 3.8 3.7  CL 109 106  CO2 -- 19    GLUCOSE 80 81  BUN 4* 6  CREATININE 1.10 0.87  CALCIUM -- 8.9  MG -- --  PHOS -- --    Liver function tests: No results found for this basename: AST:2,ALT:2,ALKPHOS:2,BILITOT:2,PROT:2,ALBUMIN:2 in the last 72 hours No results found for this basename: LIPASE:2,AMYLASE:2 in the last 72 hours  CBC:  Basename 04/23/12 0014 04/22/12 0325 04/22/12 0316  WBC 6.3 -- 10.6*  NEUTROABS -- -- 8.0*  HGB 12.1* 12.9* --  HCT 34.8* 38.0* --  MCV 94.6 -- 94.0  PLT 210 -- 222    Cardiac Enzymes:  Basename 04/22/12 1620 04/22/12 1040 04/22/12 0640 04/22/12 0316  CKTOTAL -- -- -- --  CKMB -- -- -- --  TROPONINI <0.30 <0.30 <0.30 <0.30     Tele:  Sinus brady, he had 1 asymptomatic pause.  Echo revealed a thrombus in the Lv.   Medications:    Infusions:    . heparin 1,350 Units/hr (04/23/12 0242)    Scheduled Medications:    . aspirin  324 mg Oral NOW   Or  . aspirin  300 mg Rectal NOW  . aspirin EC  81 mg Oral Daily  . carvedilol  3.125 mg Oral BID WC  . heparin  1,500 Units Intravenous Once  . heparin  2,000 Units Intravenous  Once  . pneumococcal 23 valent vaccine  0.5 mL Intramuscular Tomorrow-1000  . simvastatin  40 mg Oral q1800  . Ticagrelor  90 mg Oral BID  . DISCONTD: enoxaparin (LOVENOX) injection  40 mg Subcutaneous Q24H    Assessment/ Plan:   . 1. Ischemic cardiomyopathy:  Due to 2 MIs.  Unfortunately he does not always take his medications.  His prognosis is poor.  His BP is too low to start ACE-inhibitor at this point.   Will have him ambulate today.  I have discussed all of these issues.  I had stressed the importance of taking his medications and the risks of not taking them.    2. Possible syncope.   There has been no evidence that this was an arrythmia.  He was likely  Intoxicated  3. LV thrombus:  Currently on heparin drip.  We will need to try to maintain him on coumadin but his ETOH habits and his history of medical noncompliance are very  worrisome .   I explained all of these issues with him.  . Start coumadin today.  Disposition: ambulate, home in several days if stable  Length of Stay: 1  [name], [name]., MD, [facility] 04/23/2012, 8:27 AM Office [phone] Pager [phone]
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
## 2012-04-23 NOTE — Progress Notes (Addendum)
ANTICOAGULATION CONSULT NOTE   Pharmacy Consult for Heparin / Coumadin Indication: mural thrombus  Allergies  Allergen Reactions  . Bee Venom Anaphylaxis  . Penicillins Rash    Labs:  Basename 04/23/12 0014 04/22/12 1620 04/22/12 1040 04/22/12 0640 04/22/12 0325 04/22/12 0316  HGB 12.1* -- -- -- 12.9* --  HCT 34.8* -- -- -- 38.0* 37.6*  PLT 210 -- -- -- -- 222  APTT -- -- -- -- -- --  LABPROT -- -- -- -- -- --  INR -- -- -- -- -- --  HEPARINUNFRC 0.12* -- -- -- -- --  CREATININE -- -- -- -- 1.10 0.87  CKTOTAL -- -- -- -- -- --  CKMB -- -- -- -- -- --  TROPONINI -- <0.30 <0.30 <0.30 -- --    Estimated Creatinine Clearance: 89.8 ml/min (by C-G formula based on Cr of 1.1).  Assessment: 36 yo male with mural thrombus on ECHO for Heparin  Beginning Coumadin today.  Heparin level = 0.25  Goal of Therapy:  Heparin level 0.3-0.7 units/ml Monitor platelets by anticoagulation protocol: Yes INR = 2 to 3   Plan: 1) Increase heparin to 1550 units / hr 2) Coumadin 5 mg po daily at 1800 3) 6 hour heparin level 3) Daily INR  Thank you. Okey Regal, PharmD [phone]   04/23/2012 8:51 AM

## 2012-04-23 NOTE — Progress Notes (Signed)
Pt has 3.3 sec pause, pt asleep, asymptomatic.  Pt HR in 50's and Coreg 3.125 scheduled at 0800.  MD notified with call back.  MD states ok to give Coreg and MD aware of 3.3 sec pause.  Will con't to monitor and notify MD of any changes.

## 2012-04-23 NOTE — Progress Notes (Signed)
Utilization Review Completed.  

## 2012-04-23 NOTE — Progress Notes (Signed)
ANTICOAGULATION CONSULT NOTE   Pharmacy Consult for Heparin Indication: mural thrombus  Allergies  Allergen Reactions  . Bee Venom Anaphylaxis  . Penicillins Rash    Patient Measurements: Height: 5\' 8"  (172.7 cm) Weight: 152 lb 6.4 oz (69.128 kg) IBW/kg (Calculated) : 68.4   Vital Signs: Temp: 97.1 F (36.2 C) (10/20 2058) Temp src: Oral (10/20 2058) BP: 96/50 mmHg (10/20 2058) Pulse Rate: 70  (10/20 2058)  Labs:  Basename 04/23/12 0014 04/22/12 1620 04/22/12 1040 04/22/12 0640 04/22/12 0325 04/22/12 0316  HGB 12.1* -- -- -- 12.9* --  HCT 34.8* -- -- -- 38.0* 37.6*  PLT 210 -- -- -- -- 222  APTT -- -- -- -- -- --  LABPROT -- -- -- -- -- --  INR -- -- -- -- -- --  HEPARINUNFRC 0.12* -- -- -- -- --  CREATININE -- -- -- -- 1.10 0.87  CKTOTAL -- -- -- -- -- --  CKMB -- -- -- -- -- --  TROPONINI -- <0.30 <0.30 <0.30 -- --    Estimated Creatinine Clearance: 89.8 ml/min (by C-G formula based on Cr of 1.1).  Assessment: 36 yo male with mural thrombus on ECHO for Heparin  Goal of Therapy:  Heparin level 0.3-0.7 units/ml Monitor platelets by anticoagulation protocol: Yes   Plan:  Heparin 2000 units IV bolus, then increase heparin 1350 units.hr Follow-up am labs.  Geannie Risen, PharmD, BCPS  04/23/2012 1:29 AM

## 2012-04-23 NOTE — Progress Notes (Signed)
ANTICOAGULATION CONSULT NOTE: HEPARIN Indication: Mural thrombus  Heparin level = 0.29 on 1550 units/hr Goal heparin level = 0.3-0.7  Heparin level is slightly subtherapeutic. Will increase the rate of the heparin to 1650 units/hr to move heparin level into the therapeutic range. Continue daily AM heparin level and CBC.  Cardell Peach, PharmD

## 2012-04-24 DIAGNOSIS — I509 Heart failure, unspecified: Secondary | ICD-10-CM

## 2012-04-24 DIAGNOSIS — I5022 Chronic systolic (congestive) heart failure: Secondary | ICD-10-CM

## 2012-04-24 LAB — CBC
HCT: 38.6 % — ABNORMAL LOW (ref 39.0–52.0)
Hemoglobin: 13.6 g/dL (ref 13.0–17.0)
MCV: 94.8 fL (ref 78.0–100.0)
RDW: 12.6 % (ref 11.5–15.5)
WBC: 9.3 10*3/uL (ref 4.0–10.5)

## 2012-04-24 LAB — PROTIME-INR: INR: 0.98 (ref 0.00–1.49)

## 2012-04-24 LAB — HEPARIN LEVEL (UNFRACTIONATED): Heparin Unfractionated: 0.59 IU/mL (ref 0.30–0.70)

## 2012-04-24 MED ORDER — HEPARIN BOLUS VIA INFUSION
3000.0000 [IU] | Freq: Once | INTRAVENOUS | Status: AC
  Administered 2012-04-24: 3000 [IU] via INTRAVENOUS
  Filled 2012-04-24: qty 3000

## 2012-04-24 MED ORDER — WARFARIN SODIUM 7.5 MG PO TABS
7.5000 mg | ORAL_TABLET | Freq: Once | ORAL | Status: AC
  Administered 2012-04-24: 7.5 mg via ORAL
  Filled 2012-04-24: qty 1

## 2012-04-24 NOTE — Progress Notes (Signed)
ANTICOAGULATION CONSULT NOTE   Pharmacy Consult for Heparin / Coumadin Indication: mural thrombus  Allergies  Allergen Reactions  . Bee Venom Anaphylaxis  . Penicillins Rash    Labs:  Basename 04/24/12 0845 04/24/12 0610 04/23/12 1726 04/23/12 0835 04/23/12 0014 04/22/12 1620 04/22/12 1040 04/22/12 0640 04/22/12 0325 04/22/12 0316  HGB -- 13.6 -- -- 12.1* -- -- -- -- --  HCT -- 38.6* -- -- 34.8* -- -- -- 38.0* --  PLT -- 218 -- -- 210 -- -- -- -- 222  APTT -- -- -- -- -- -- -- -- -- --  LABPROT -- 12.9 -- 14.6 -- -- -- -- -- --  INR -- 0.98 -- 1.16 -- -- -- -- -- --  HEPARINUNFRC 0.17* <0.10* 0.29* -- -- -- -- -- -- --  CREATININE -- -- -- -- -- -- -- -- 1.10 0.87  CKTOTAL -- -- -- -- -- -- -- -- -- --  CKMB -- -- -- -- -- -- -- -- -- --  TROPONINI -- -- -- -- -- <0.30 <0.30 <0.30 -- --    Estimated Creatinine Clearance: 89.3 ml/min (by C-G formula based on Cr of 1.1).  Assessment: 36 yo male with mural thrombus on ECHO for Heparin  Day 2 of Heparin / Coumadin  Heparin level = 0.17  Goal of Therapy:  Heparin level 0.3-0.7 units/ml Monitor platelets by anticoagulation protocol: Yes INR = 2 to 3   Plan: 1) Heparin 3000 units iv bolus x 1, drip to 1850 units/hr 2) Coumadin 7.5 mg po x 1 today 3) 6 hour heparin level 3) Daily INR  Thank you. Okey Regal, PharmD [phone]   04/24/2012 9:42 AM

## 2012-04-24 NOTE — Progress Notes (Signed)
ANTICOAGULATION CONSULT NOTE Heparin Indication: mural thrombus  Heparin level = 0.59 on 1850 units/hr Goal heparin level = 0.3-0.7  Heparin level is within desired range. Continue same rate of 1850 units/hr. Resume daily AM heparin level and CBC.  Cardell Peach, PharmD

## 2012-04-24 NOTE — Progress Notes (Signed)
PROGRESS NOTE  Subjective:   Niraj is a 36 yo with hx of CAD, noncompliance, smoking who has had 2 Myocardial infactions this year resulting in an ischemic CM and EF of 10-15%.  He presented to the hospital after falling in the bathroom.  He had been drinking alcohol.  He has been noncompliant with his medications.  He has had no further episodes of syncope.  No CP or dyspnea.  Objective:    Vital Signs:   Temp:  [97 F (36.1 C)-99.2 F (37.3 C)] 98.1 F (36.7 C) (10/22 0447) Pulse Rate:  [62-79] 62  (10/22 0447) Resp:  [18-20] 20  (10/22 0447) BP: (96-115)/(58-74) 99/58 mmHg (10/22 0447) SpO2:  [97 %-99 %] 98 % (10/22 0447) Weight:  [149 lb 14.6 oz (68 kg)] 149 lb 14.6 oz (68 kg) (10/22 0447)  Last BM Date: 04/22/12   24-hour weight change: Weight change: -2 lb 7.8 oz (-1.128 kg)  Weight trends: Filed Weights   04/22/12 1430 04/23/12 0450 04/24/12 0447  Weight: 152 lb 6.4 oz (69.128 kg) 154 lb 1.6 oz (69.899 kg) 149 lb 14.6 oz (68 kg)    Intake/Output:  10/21 0701 - 10/22 0700 In: 1578.8 [P.O.:1140; I.V.:438.8] Out: 1050 [Urine:1050]     Physical Exam: BP 99/58  Pulse 62  Temp 98.1 F (36.7 C) (Oral)  Resp 20  Ht 5\' 8"  (1.727 m)  Wt 149 lb 14.6 oz (68 kg)  BMI 22.79 kg/m2  SpO2 98%  General: Vital signs reviewed and noted.   Head: Normocephalic, atraumatic.  Eyes: conjunctivae/corneas clear.  EOM's intact.   Throat: normal  Neck: Supple. Normal carotids. No JVD  Lungs:  clear  Heart: Regular rate,  With normal  S1 S2. No murmurs, gallops or rubs  Abdomen:  Soft, non-tender, non-distended with normoactive bowel sounds. No hepatomegaly. No rebound/guarding. No abdominal masses.  Extremities: Distal pedal pulses are 2+ .  No edema.    Neurologic: A&O X3, CN II - XII are grossly intact. Motor strength is 5/5 in the all 4 extremities.  Psych: Responds to questions appropriately with normal affect.    Labs: BMET:  Basename 04/22/12 0325 04/22/12 0316    NA 143 138  K 3.8 3.7  CL 109 106  CO2 -- 19  GLUCOSE 80 81  BUN 4* 6  CREATININE 1.10 0.87  CALCIUM -- 8.9  MG -- --  PHOS -- --    Liver function tests: No results found for this basename: AST:2,ALT:2,ALKPHOS:2,BILITOT:2,PROT:2,ALBUMIN:2 in the last 72 hours No results found for this basename: LIPASE:2,AMYLASE:2 in the last 72 hours  CBC:  Basename 04/23/12 0014 04/22/12 0325 04/22/12 0316  WBC 6.3 -- 10.6*  NEUTROABS -- -- 8.0*  HGB 12.1* 12.9* --  HCT 34.8* 38.0* --  MCV 94.6 -- 94.0  PLT 210 -- 222    Cardiac Enzymes:  Basename 04/22/12 1620 04/22/12 1040 04/22/12 0640 04/22/12 0316  CKTOTAL -- -- -- --  CKMB -- -- -- --  TROPONINI <0.30 <0.30 <0.30 <0.30     Tele:  Sinus brady, he had 1 asymptomatic pause.  Echo revealed a thrombus in the LV   Medications:    Infusions:    . heparin 1,650 Units/hr (04/23/12 2122)  . DISCONTD: heparin 1,550 Units/hr (04/23/12 0933)    Scheduled Medications:    . carvedilol  3.125 mg Oral BID WC  . patient's guide to using coumadin book   Does not apply Once  . pneumococcal 23 valent vaccine  0.5 mL Intramuscular Tomorrow-1000  . simvastatin  40 mg Oral q1800  . Ticagrelor  90 mg Oral BID  . warfarin  5 mg Oral q1800  . Warfarin - Pharmacist Dosing Inpatient   Does not apply q1800  . DISCONTD: aspirin EC  81 mg Oral Daily    Assessment/ Plan:   . 1. Ischemic cardiomyopathy:  Due to 2 MIs.  Unfortunately he does not always take his medications.  His prognosis is poor.  His BP is too low to start ACE-inhibitor at this point.   Will have him ambulate today.  2. Possible syncope.   There has been no evidence that this was an arrythmia.  He was likely  Intoxicated.  He has not had any PVCs or evidence of VT.  3. LV thrombus:  Currently on heparin drip.  We will need to try to maintain him on coumadin but his ETOH habits and his history of medical noncompliance are very worrisome .   I explained all of these  issues with him.  . On coumadin.  Disposition: ambulate, home in several days once INR is theraputic if stable  Length of Stay: 2  [name], [name]., MD, [facility] 04/24/2012, 7:36 AM Office [phone] Pager [phone]

## 2012-04-25 DIAGNOSIS — F17200 Nicotine dependence, unspecified, uncomplicated: Secondary | ICD-10-CM

## 2012-04-25 DIAGNOSIS — I519 Heart disease, unspecified: Secondary | ICD-10-CM

## 2012-04-25 LAB — CBC
MCV: 93.7 fL (ref 78.0–100.0)
Platelets: 205 10*3/uL (ref 150–400)
RBC: 3.98 MIL/uL — ABNORMAL LOW (ref 4.22–5.81)
RDW: 12.4 % (ref 11.5–15.5)
WBC: 8.7 10*3/uL (ref 4.0–10.5)

## 2012-04-25 LAB — PROTIME-INR
INR: 1.32 (ref 0.00–1.49)
Prothrombin Time: 16.1 seconds — ABNORMAL HIGH (ref 11.6–15.2)

## 2012-04-25 LAB — HEPARIN LEVEL (UNFRACTIONATED): Heparin Unfractionated: 0.43 IU/mL (ref 0.30–0.70)

## 2012-04-25 MED ORDER — WARFARIN SODIUM 7.5 MG PO TABS
7.5000 mg | ORAL_TABLET | Freq: Once | ORAL | Status: AC
  Administered 2012-04-25: 7.5 mg via ORAL
  Filled 2012-04-25: qty 1

## 2012-04-25 NOTE — Progress Notes (Signed)
4098 was notified by monitor tech that patient heart rate had decreased to 35 bpm non-sustained. Patient was resting in bed at the time. Patient is asymptomatic. VS were taken and documented. MD on call was notified. Orders given to  hold morning dose of coreg today.

## 2012-04-25 NOTE — Progress Notes (Signed)
PROGRESS NOTE  Subjective:   Logan [name] is a 36 yo with hx of CAD, noncompliance, smoking who has had 2 Myocardial infactions this year resulting in an ischemic CM and EF of 10-15%.  He presented to the hospital after falling in the bathroom.  He had been drinking alcohol.  He has been noncompliant with his medications.  He has had no further episodes of syncope.  No CP or dyspnea.  Echo shows an LV thrombus.  Objective:    Vital Signs:   Temp:  [97.7 F (36.5 C)-98.3 F (36.8 C)] 97.9 F (36.6 C) (10/23 0601) Pulse Rate:  [63-75] 65  (10/23 0601) Resp:  [16-19] 16  (10/23 0601) BP: (91-118)/(50-77) 92/50 mmHg (10/23 0601) SpO2:  [97 %-100 %] 97 % (10/23 0601) Weight:  [148 lb 5.9 oz (67.3 kg)] 148 lb 5.9 oz (67.3 kg) (10/23 0601)  Last BM Date: 04/23/12   24-hour weight change: Weight change: -1 lb 8.7 oz (-0.7 kg)  Weight trends: Filed Weights   04/23/12 0450 04/24/12 0447 04/25/12 0601  Weight: 154 lb 1.6 oz (69.899 kg) 149 lb 14.6 oz (68 kg) 148 lb 5.9 oz (67.3 kg)    Intake/Output:  10/22 0701 - 10/23 0700 In: 2362.8 [P.O.:1920; I.V.:442.8] Out: 2100 [Urine:2100]     Physical Exam: BP 92/50  Pulse 65  Temp 97.9 F (36.6 C) (Oral)  Resp 16  Ht 5\' 8"  (1.727 m)  Wt 148 lb 5.9 oz (67.3 kg)  BMI 22.56 kg/m2  SpO2 97%  General: Vital signs reviewed and noted.   Head: Normocephalic, atraumatic.  Eyes: conjunctivae/corneas clear.  EOM's intact.   Throat: normal  Neck: Supple. Normal carotids. No JVD  Lungs:  clear  Heart: Regular rate,  With normal  S1 S2. No murmurs, gallops or rubs  Abdomen:  Soft, non-tender, non-distended with normoactive bowel sounds. No hepatomegaly. No rebound/guarding. No abdominal masses.  Extremities: Distal pedal pulses are 2+ .  No edema.    Neurologic: A&O X3, CN II - XII are grossly intact. Motor strength is 5/5 in the all 4 extremities.  Psych: Responds to questions appropriately with normal affect.    Labs: BMET: No results  found for this basename: NA:2,K:2,CL:2,CO2:2,GLUCOSE:2,BUN:2,CREATININE:2,CALCIUM:2,MG:2,PHOS:2 in the last 72 hours  Liver function tests: No results found for this basename: AST:2,ALT:2,ALKPHOS:2,BILITOT:2,PROT:2,ALBUMIN:2 in the last 72 hours No results found for this basename: LIPASE:2,AMYLASE:2 in the last 72 hours  CBC:  Basename 04/25/12 0545 04/24/12 0610  WBC 8.7 9.3  NEUTROABS -- --  HGB 13.6 13.6  HCT 37.3* 38.6*  MCV 93.7 94.8  PLT 205 218    Cardiac Enzymes:  Basename 04/22/12 1620 04/22/12 1040  CKTOTAL -- --  CKMB -- --  TROPONINI <0.30 <0.30     Tele:  Sinus brady, he had 1 asymptomatic pause.  Echo revealed a thrombus in the LV   Medications:    Infusions:    . heparin 1,850 Units/hr (04/24/12 2311)    Scheduled Medications:    . carvedilol  3.125 mg Oral BID WC  . heparin  3,000 Units Intravenous Once  . simvastatin  40 mg Oral q1800  . Ticagrelor  90 mg Oral BID  . warfarin  7.5 mg Oral ONCE-1800  . Warfarin - Pharmacist Dosing Inpatient   Does not apply q1800  . DISCONTD: warfarin  5 mg Oral q1800    Assessment/ Plan:   . 1. Ischemic cardiomyopathy:  Due to 2 MIs.  Unfortunately he does not always take his  medications.  His prognosis is poor.  His BP is too low to start ACE-inhibitor at this point.   He has episodes of bradycardia but these occur while he is sleeping and he is asymptomatic.  We need to continue coreg.  Hold for sustained HR < 30.  Will have him ambulate today.  I have discussed AICD ( which I don not think he is a candidate for yet due to noncompiance) and also the Life Vest.  He would like to have a Life Vest placed at discharge.    2. Possible syncope.   There has been no evidence that this was an arrythmia.  He was likely  Intoxicated.  He has not had any PVCs or evidence of VT.  3. LV thrombus:  Currently on heparin drip.  We will need to try to maintain him on coumadin but his ETOH habits and his history of medical  noncompliance are very worrisome .   I explained all of these issues with him.  . On coumadin.  Disposition: ambulate, home in several days once INR is theraputic if stable  Length of Stay: 3  Logan [name], Logan [name]., MD, [facility] 04/25/2012, 8:11 AM Office [phone] Pager [phone]

## 2012-04-25 NOTE — Progress Notes (Signed)
ANTICOAGULATION CONSULT NOTE   Pharmacy Consult for Heparin / Coumadin Indication: LV / mural thrombus  Allergies  Allergen Reactions  . Bee Venom Anaphylaxis  . Penicillins Rash    Labs:  Alvira Philips 04/25/12 0545 04/24/12 1654 04/24/12 0845 04/24/12 0610 04/23/12 0835 04/23/12 0014 04/22/12 1620 04/22/12 1040  HGB 13.6 -- -- 13.6 -- -- -- --  HCT 37.3* -- -- 38.6* -- 34.8* -- --  PLT 205 -- -- 218 -- 210 -- --  APTT -- -- -- -- -- -- -- --  LABPROT 16.1* -- -- 12.9 14.6 -- -- --  INR 1.32 -- -- 0.98 1.16 -- -- --  HEPARINUNFRC 0.43 0.59 0.17* -- -- -- -- --  CREATININE -- -- -- -- -- -- -- --  CKTOTAL -- -- -- -- -- -- -- --  CKMB -- -- -- -- -- -- -- --  TROPONINI -- -- -- -- -- -- <0.30 <0.30    Estimated Creatinine Clearance: 88.4 ml/min (by C-G formula based on Cr of 1.1).  Assessment: 36 yo male with mural / LV thrombus on ECHO for Heparin  Day 3 of Heparin / Coumadin  Heparin level = 0.43 / INR increasing = 1.32  Goal of Therapy:  Heparin level 0.3-0.7 units/ml Monitor platelets by anticoagulation protocol: Yes INR = 2 to 3   Plan: 1) Continue heparin drip at 1850 units / hr 2) Coumadin 7.5 mg po x 1 today 3) Daily INR  Thank you. Okey Regal, PharmD [phone]   04/25/2012 8:44 AM

## 2012-04-26 ENCOUNTER — Encounter (HOSPITAL_COMMUNITY): Payer: Self-pay | Admitting: Nurse Practitioner

## 2012-04-26 DIAGNOSIS — I513 Intracardiac thrombosis, not elsewhere classified: Secondary | ICD-10-CM | POA: Diagnosis present

## 2012-04-26 DIAGNOSIS — Z9119 Patient's noncompliance with other medical treatment and regimen: Secondary | ICD-10-CM

## 2012-04-26 DIAGNOSIS — R55 Syncope and collapse: Secondary | ICD-10-CM | POA: Diagnosis present

## 2012-04-26 LAB — CBC
HCT: 37.4 % — ABNORMAL LOW (ref 39.0–52.0)
MCHC: 35.3 g/dL (ref 30.0–36.0)
Platelets: 223 10*3/uL (ref 150–400)
RDW: 12.4 % (ref 11.5–15.5)
WBC: 7.9 10*3/uL (ref 4.0–10.5)

## 2012-04-26 LAB — PROTIME-INR
INR: 1.68 — ABNORMAL HIGH (ref 0.00–1.49)
Prothrombin Time: 19.2 seconds — ABNORMAL HIGH (ref 11.6–15.2)

## 2012-04-26 LAB — HEPARIN LEVEL (UNFRACTIONATED): Heparin Unfractionated: 0.35 IU/mL (ref 0.30–0.70)

## 2012-04-26 MED ORDER — WARFARIN SODIUM 7.5 MG PO TABS
7.5000 mg | ORAL_TABLET | Freq: Once | ORAL | Status: AC
  Administered 2012-04-26: 7.5 mg via ORAL
  Filled 2012-04-26: qty 1

## 2012-04-26 NOTE — Progress Notes (Signed)
ANTICOAGULATION CONSULT NOTE - Follow-Up  Pharmacy Consult for Heparin / Coumadin Indication: LV / mural thrombus  Allergies  Allergen Reactions  . Bee Venom Anaphylaxis  . Penicillins Rash    Labs:  Basename 04/26/12 0530 04/25/12 0545 04/24/12 1654 04/24/12 0610  HGB 13.2 13.6 -- --  HCT 37.4* 37.3* -- 38.6*  PLT 223 205 -- 218  APTT -- -- -- --  LABPROT 19.2* 16.1* -- 12.9  INR 1.68* 1.32 -- 0.98  HEPARINUNFRC 0.35 0.43 0.59 --  CREATININE -- -- -- --  CKTOTAL -- -- -- --  CKMB -- -- -- --  TROPONINI -- -- -- --    Estimated Creatinine Clearance: 88.5 ml/min (by C-G formula based on Cr of 1.1).  Assessment: 36 yo male with mural / LV thrombus on ECHO for Heparin/Coumadin bridge.  Day 4/5 minimum overlap of Heparin / Coumadin  Heparin level therapeutic (0.35). INR 1.68 - moving nicely. No bleeding noted.  Goal of Therapy:  Heparin level 0.3-0.7 units/ml Monitor platelets by anticoagulation protocol: Yes INR = 2 to 3   Plan: 1) Continue heparin drip at 1850 units / hr 2) Coumadin 7.5 mg po x 1 today 3) Daily INR, heparin level, CBC  Christoper Fabian, PharmD, BCPS Clinical pharmacist, pager [phone] 04/26/2012 1:10 PM

## 2012-04-26 NOTE — Progress Notes (Signed)
Patient Name: Logan [name] Date of Encounter: 04/26/2012   Principal Problem:  *Syncope Active Problems:  LV (left ventricular) mural thrombus  Chronic systolic CHF (congestive heart failure)  CAD (coronary artery disease)  Tobacco abuse  Hyperlipidemia  Noncompliance   SUBJECTIVE  36 year old male with history of MI 01/2012 with PCI to LAD, Stent thrombosis 02/2012, and ischemic cardiomyopathy in addition to above problem list. Admitted on 10/20 after being found at home after syncopal episode. Today patient has no complaints of chest pain, palpitations, lightheaded. He states he is always SOB and is typically not worse with ambulation on flat surface, which has been consistent with recent inpatient ambulation. Currently has no other complaints. INR is trending upwards, but not yet therapeutic. Remains relatively hypotensive with SBP 85-110(85/45 this morning). He is asymptomatic.  CURRENT MEDS    . carvedilol  3.125 mg Oral BID WC  . simvastatin  40 mg Oral q1800  . Ticagrelor  90 mg Oral BID  . warfarin  7.5 mg Oral ONCE-1800  . Warfarin - Pharmacist Dosing Inpatient   Does not apply q1800    OBJECTIVE  Filed Vitals:   04/25/12 1003 04/25/12 1451 04/25/12 2050 04/26/12 0611  BP: 114/83 104/59 104/50 85/45  Pulse: 62 63 57 57  Temp:  97.5 F (36.4 C) 97.9 F (36.6 C) 97.7 F (36.5 C)  TempSrc:  Oral Oral Oral  Resp: 16 17 18 16   Height:      Weight:    148 lb 9.4 oz (67.4 kg)  SpO2: 100% 100% 100% 98%    Intake/Output Summary (Last 24 hours) at 04/26/12 0906 Last data filed at 04/26/12 0840  Gross per 24 hour  Intake   1640 ml  Output   2425 ml  Net   -785 ml   Filed Weights   04/24/12 0447 04/25/12 0601 04/26/12 0611  Weight: 149 lb 14.6 oz (68 kg) 148 lb 5.9 oz (67.3 kg) 148 lb 9.4 oz (67.4 kg)    PHYSICAL EXAM  General: Pleasant, NAD.   Neuro: Alert and oriented X 3. Moves all extremities spontaneously. Psych: Normal affect. HEENT:  Normal  Neck:  Supple without bruits. Flat neck veins. Lungs:  Resp regular and unlabored, CTA. Heart: RRR no s3, s4. 1/6 Systolic Murmur lusb. Abdomen: Soft, non-tender, non-distended, BS + x 4.  Extremities: No clubbing, cyanosis or edema. DP/PT/Radials 2+ and equal bilaterally.  Accessory Clinical Findings  CBC  Basename 04/26/12 0530 04/25/12 0545  WBC 7.9 8.7  NEUTROABS -- --  HGB 13.2 13.6  HCT 37.4* 37.3*  MCV 93.5 93.7  PLT 223 205   Basic Metabolic Panel     Component Value Date/Time   NA 143 04/22/2012 0325   K 3.8 04/22/2012 0325   CL 109 04/22/2012 0325   CO2 19 04/22/2012 0316   GLUCOSE 80 04/22/2012 0325   BUN 4* 04/22/2012 0325   CREATININE 1.10 04/22/2012 0325   CALCIUM 8.9 04/22/2012 0316   GFRNONAA >90 04/22/2012 0316   GFRAA >90 04/22/2012 0316     Lab Results  Component Value Date   INR 1.68* 04/26/2012   INR 1.32 04/25/2012   INR 0.98 04/24/2012   TELE  SR, rate 60. Rare PVC.   ECG  RSR rate 66. Diffuse T wave inversion, which is not a new finding.   Radiology/Studies  Dg Chest Port 1 View  04/22/2012  *RADIOLOGY REPORT*  Clinical Data: Shortness of breath and chest pain.  PORTABLE CHEST -  1 VIEW  Comparison: None.  Findings: The heart size and pulmonary vascularity are normal. The lungs appear clear and expanded without focal air space disease or consolidation. No blunting of the costophrenic angles.  No pneumothorax.  Mediastinal contours appear intact.  IMPRESSION: No evidence of active pulmonary disease.   Original Report Authenticated By: Marlon Pel, M.D.    ECHO 04/22/2012  - Left ventricle: LVEF is severely depressed at approximately 20% with akinesis of the mid/distal inferior, the inferoseptal, the mid/distal posterior and the apical walls; hypokinesis elsewhere. Echobright lesion along distal inferior/posterior wall consistent with mural thrombus. The cavity size was severely dilated. Wall thickness was normal. Features are  consistent with a pseudonormal left ventricular filling pattern, with concomitant abnormal relaxation and increased filling pressure (grade 2 diastolic dysfunction). - Mitral valve: Mild regurgitation.  ASSESSMENT AND PLAN  1. Ischemic cardiomyopathy/Chronic Systolic CHF: Patient has been asymptomatic with ambulation. Weight remain stable -euvolemic on exam. BP remains too low to start ACE-inhibitor at this point. Continue coreg. Hold for sustained HR < 50 (none noted). Patient desires be fitted with life vest prior to discharge already has an appointment with the heart failure clinic in one week.   2. LV thrombus: Currently on heparin drip and Coumadin, INR trending upward but not yet therapeutic. Will attempt to manage with coumadin, but is worrisome with ETOH and questionable compliance histories. Plan to discharge once INR is therapeutic and stable. He will need f/u in [facility], which we will arrange for early next week.  We discussed the importance of compliance w/ f/u and he verbalized understanding.  3. Possible syncope. There has been no evidence that this was an arrythmia. He has had 1 PVC in the past 24 hours and no evidence of VT. ? profound bradycardia related to possible valsalva maneuver while in bathroom. He was possibly intoxicated at time of syncope.  Life Vest to be placed @ d/c.  4.  CAD:  No chest pain.  Cont brilinta, bb, simva.  No asa 2/2 coumadin initiation.  5.  Tob/ETOH abuse:  Cessation advised.  Signed, Nicolasa Ducking NP  Attending Note:   The patient was seen and examined.  Agree with assessment and plan as noted above.  I have made changes where appropriate in the above history and physical. He seems to be doing fairly well.  We are limited by his hypotension.  Anticipate Dc tomorrow.   [name], [name]., MD, [facility] 04/26/2012, 4:46 PM

## 2012-04-27 ENCOUNTER — Encounter (HOSPITAL_COMMUNITY): Payer: Self-pay | Admitting: Physician Assistant

## 2012-04-27 ENCOUNTER — Other Ambulatory Visit (HOSPITAL_COMMUNITY): Payer: Self-pay

## 2012-04-27 DIAGNOSIS — I255 Ischemic cardiomyopathy: Secondary | ICD-10-CM | POA: Diagnosis present

## 2012-04-27 DIAGNOSIS — Z9119 Patient's noncompliance with other medical treatment and regimen: Secondary | ICD-10-CM

## 2012-04-27 DIAGNOSIS — I219 Acute myocardial infarction, unspecified: Secondary | ICD-10-CM

## 2012-04-27 LAB — HEPARIN LEVEL (UNFRACTIONATED): Heparin Unfractionated: 0.24 IU/mL — ABNORMAL LOW (ref 0.30–0.70)

## 2012-04-27 LAB — CBC
MCH: 33.5 pg (ref 26.0–34.0)
MCHC: 35.5 g/dL (ref 30.0–36.0)
RDW: 12.4 % (ref 11.5–15.5)

## 2012-04-27 LAB — PROTIME-INR: Prothrombin Time: 21.2 seconds — ABNORMAL HIGH (ref 11.6–15.2)

## 2012-04-27 MED ORDER — TICAGRELOR 90 MG PO TABS: 90.0000 mg | ORAL_TABLET | Freq: Two times a day (BID) | ORAL | Status: DC

## 2012-04-27 MED ORDER — WARFARIN SODIUM 7.5 MG PO TABS
7.5000 mg | ORAL_TABLET | Freq: Once | ORAL | Status: DC
  Filled 2012-04-27: qty 1

## 2012-04-27 MED ORDER — WARFARIN SODIUM 7.5 MG PO TABS
7.5000 mg | ORAL_TABLET | Freq: Once | ORAL | Status: AC
  Administered 2012-04-27: 7.5 mg via ORAL
  Filled 2012-04-27: qty 1

## 2012-04-27 MED ORDER — WARFARIN SODIUM 5 MG PO TABS: 7.5000 mg | ORAL_TABLET | Freq: Every day | ORAL | Status: DC

## 2012-04-27 MED ORDER — WARFARIN - PHARMACIST DOSING INPATIENT: Freq: Every day | Status: DC

## 2012-04-27 NOTE — Plan of Care (Signed)
Problem: Phase I Progression Outcomes Goal: EF % per last Echo/documented,Core Reminder form on chart Outcome: Completed/Met Date Met:  04/27/12 04/22/12 Echo done EF 20 %

## 2012-04-27 NOTE — Progress Notes (Addendum)
ANTICOAGULATION CONSULT NOTE - Follow-Up  Pharmacy Consult for Heparin / Coumadin Indication: LV / mural thrombus  Allergies  Allergen Reactions  . Bee Venom Anaphylaxis  . Penicillins Rash    Labs:  Basename 04/27/12 0510 04/26/12 0530 04/25/12 0545  HGB 13.2 13.2 --  HCT 37.2* 37.4* 37.3*  PLT 248 223 205  APTT -- -- --  LABPROT 21.2* 19.2* 16.1*  INR 1.92* 1.68* 1.32  HEPARINUNFRC 0.24* 0.35 0.43  CREATININE -- -- --  CKTOTAL -- -- --  CKMB -- -- --  TROPONINI -- -- --    Estimated Creatinine Clearance: 88.9 ml/min (by C-G formula based on Cr of 1.1).  Assessment: 36 yo male with mural / LV thrombus on ECHO for Heparin/Coumadin bridge.  Day 5/5 minimum overlap of Heparin / Coumadin  Heparin level subtherapeutic (0.24). INR 1.92 - moving nicely. Suspect will be therapeutic in a.m. No bleeding noted.  Goal of Therapy:  Heparin level 0.3-0.7 units/ml Monitor platelets by anticoagulation protocol: Yes INR = 2 to 3   Plan: 1) Increase heparin drip to 2000 units / hr 2) Coumadin 7.5 mg po x 1 today 3) Daily INR, heparin level, CBC 4) If pt to be d/c home today - consider giving coumadin 5 mg tablets - take 1.5 tablets (7.5 mg) daily x 2 days then 5 mg daily  - f/u Mon or Tues at [facility]  Christoper Fabian, PharmD, BCPS Clinical pharmacist, pager [phone] 04/27/2012 8:06 AM   **Noted pt going home today. Will give coumadin 7.5 mg this a.m. Before pt goes home - Noted f/u at coumadin clinic on Monday**

## 2012-04-27 NOTE — Progress Notes (Signed)
Patient Name: Logan [name] Date of Encounter: 04/27/2012   Principal Problem:  *Syncope Active Problems:  Tobacco abuse  Chronic systolic CHF (congestive heart failure)  CAD (coronary artery disease)  Hyperlipidemia  LV (left ventricular) mural thrombus  Noncompliance   SUBJECTIVE  36 year old male with history of MI 01/2012 with PCI to LAD, Stent thrombosis 02/2012, and ischemic cardiomyopathy in addition to above problem list. Admitted on 10/20 after being found at home after syncopal episode. Today patient has no complaints of chest pain, palpitations, lightheaded. He states he is always SOB and is typically not worse with ambulation on flat surface, which has been consistent with recent inpatient ambulation. Currently has no other complaints. INR 1.92.   CURRENT MEDS    . carvedilol  3.125 mg Oral BID WC  . simvastatin  40 mg Oral q1800  . Ticagrelor  90 mg Oral BID  . warfarin  7.5 mg Oral ONCE-1800  . Warfarin - Pharmacist Dosing Inpatient   Does not apply q1800    OBJECTIVE  Filed Vitals:   04/26/12 0611 04/26/12 1357 04/26/12 2156 04/27/12 0353  BP: 85/45 101/54 104/62 93/60  Pulse: 57 60 51 54  Temp: 97.7 F (36.5 C) 97.6 F (36.4 C) 97.4 F (36.3 C) 97.7 F (36.5 C)  TempSrc: Oral Oral Oral Oral  Resp: 16 20 18 18   Height:      Weight: 148 lb 9.4 oz (67.4 kg)   149 lb 4 oz (67.7 kg)  SpO2: 98% 99% 100% 98%    Intake/Output Summary (Last 24 hours) at 04/27/12 0800 Last data filed at 04/26/12 2157  Gross per 24 hour  Intake   2480 ml  Output   1725 ml  Net    755 ml   Filed Weights   04/25/12 0601 04/26/12 0611 04/27/12 0353  Weight: 148 lb 5.9 oz (67.3 kg) 148 lb 9.4 oz (67.4 kg) 149 lb 4 oz (67.7 kg)    PHYSICAL EXAM  General: Pleasant, NAD.   Neuro: Alert and oriented X 3. Moves all extremities spontaneously. Psych: Normal affect. HEENT:  Normal  Neck: Supple without bruits. Flat neck veins. Lungs:  Resp regular and unlabored, CTA. Heart:  RRR no s3, s4. 1/6 Systolic Murmur Abdomen: Soft, non-tender, non-distended, BS + x 4.  Extremities: No clubbing, cyanosis or edema. DP/PT/Radials 2+ and equal bilaterally.  Accessory Clinical Findings  CBC  Basename 04/27/12 0510 04/26/12 0530  WBC 7.4 7.9  NEUTROABS -- --  HGB 13.2 13.2  HCT 37.2* 37.4*  MCV 94.4 93.5  PLT 248 223   Basic Metabolic Panel     Component Value Date/Time   NA 143 04/22/2012 0325   K 3.8 04/22/2012 0325   CL 109 04/22/2012 0325   CO2 19 04/22/2012 0316   GLUCOSE 80 04/22/2012 0325   BUN 4* 04/22/2012 0325   CREATININE 1.10 04/22/2012 0325   CALCIUM 8.9 04/22/2012 0316   GFRNONAA >90 04/22/2012 0316   GFRAA >90 04/22/2012 0316     Lab Results  Component Value Date   INR 1.92* 04/27/2012   INR 1.68* 04/26/2012   INR 1.32 04/25/2012   TELE  SR, rate 60. Rare PVC.   ECG  RSR rate 66. Diffuse T wave inversion, which is not a new finding.   Radiology/Studies  Dg Chest Port 1 View  04/22/2012  *RADIOLOGY REPORT*  Clinical Data: Shortness of breath and chest pain.  PORTABLE CHEST - 1 VIEW  Comparison: None.  Findings: The  heart size and pulmonary vascularity are normal. The lungs appear clear and expanded without focal air space disease or consolidation. No blunting of the costophrenic angles.  No pneumothorax.  Mediastinal contours appear intact.  IMPRESSION: No evidence of active pulmonary disease.   Original Report Authenticated By: Marlon Pel, M.D.    ECHO 04/22/2012  - Left ventricle: LVEF is severely depressed at approximately 20% with akinesis of the mid/distal inferior, the inferoseptal, the mid/distal posterior and the apical walls; hypokinesis elsewhere. Echobright lesion along distal inferior/posterior wall consistent with mural thrombus. The cavity size was severely dilated. Wall thickness was normal. Features are consistent with a pseudonormal left ventricular filling pattern, with concomitant abnormal  relaxation and increased filling pressure (grade 2 diastolic dysfunction). - Mitral valve: Mild regurgitation.  ASSESSMENT AND PLAN  1. Ischemic cardiomyopathy/Chronic Systolic CHF: Patient has been asymptomatic with ambulation. Weight remain stable -euvolemic on exam. BP remains too low to start ACE-inhibitor at this point. Continue coreg. Hold for sustained HR < 50 (none noted). Patient desires be fitted with life vest prior to discharge. already has an appointment with the heart failure clinic on 05/02/12.    Home on : Coreg 3.123 bid simva 40 brilinta 90 bid Coumadin - per pharmacy recs  2. LV thrombus: Currently on heparin drip and Coumadin,.  INR is OK for DC.  Will have pharmacy give him his dose early.  Please also give recs for this weekend.   Have him follow up with coumadin clinic on Monday  3. Possible syncope. There has been no evidence that this was an arrythmia. He has had 1 PVC in the past 24 hours and no evidence of VT. ? profound bradycardia related to possible valsalva maneuver while in bathroom. He was possibly intoxicated at time of syncope.  Life Vest to be placed @ d/c.  4.  CAD:  No chest pain.  Cont brilinta, bb, simva.  No asa 2/2 coumadin initiation.  5.  Tob/ETOH abuse:  Cessation advised.  [name], [name]., MD, [facility] 04/27/2012, 8:02 AM Office - [phone] Pager [phone]

## 2012-04-27 NOTE — Discharge Summary (Signed)
CARDIOLOGY DISCHARGE SUMMARY   Patient ID: Logan [name] MRN: [id number] DOB/AGE: Jul 14, 1975 36 y.o.  Admit date: 04/22/2012 Discharge date: 04/27/2012  Primary Discharge Diagnosis:  Syncope   Secondary Discharge Diagnosis:  . GERD (gastroesophageal reflux disease)   . CAD (coronary artery disease) 01/22/12    Ischemic cardiomyopathy - Hx of anterolateral MI with DES to LAD EF 25%; reinfarction 02/05/12 while in [place], [place] with thrombosis of the stent. EF now down to 10%  . Hyperlipidemia   . Tobacco abuse   . Chronic systolic CHF (congestive heart failure)    EF from 25% to 10% following MI; reinfarction   . Noncompliance   . LV (left ventricular) mural thrombus    Procedures: 2-D echocardiogram  Hospital Course: Logan [name] is a 36 year old male with recently diagnosed coronary artery disease. He had a syncopal event and was found down by family. His initial ECG by EMS was abnormal and he was initially transported as a code STEMI. However, on arrival, he was having no chest pain and the code STEMI was canceled. He was admitted for further evaluation and treatment.  His cardiac enzymes were checked and his initial point-of-care troponin was minimally elevated but otherwise, his enzymes were negative for MI. A 2-D echocardiogram showed an EF of 20%. He was maintained on his beta blocker but his blood pressure does not currently tolerate the addition of an ACE inhibitor. He is on a statin and compliance with medications is encouraged.  The echocardiogram showed a mural thrombus in his left ventricle. He was started on heparin and Coumadin. His EtOH level was noted to be elevated on admission and EtOH cessation as well as smoking cessation was discussed and encouraged. Because his EF has remained low despite being 3 months out from his MI, and ICD was discussed. The patient agreed to a Lifevest and this will be placed prior to discharge. He had some bradycardia on his beta blocker but  was asleep at the time and was asymptomatic so the beta blocker was continued. He was continued on heparin until his Coumadin level was therapeutic. He was followed closely on telemetry but had no significant arrhythmia. His volume status was monitored and remained stable.  On 04/27/2012, Logan [name] was seen by Dr Elease Hashimoto. His INR was 1.92 and it was felt that the heparin could be discontinued once his Coumadin dose was given and this will be given early. There was concern for bradycardia related to possible bowel sounds off while straining at stool as a cause for syncope. He was also possibly intoxicated at the time. Lifevest is to be placed prior to discharge. His medications were reviewed and he is to continue on Brilinta as well as beta blocker and a statin. The aspirin is discontinued secondary to Coumadin initiation. Dr. Elease Hashimoto evaluated Logan [name] and considered him stable for discharge, to follow up as an outpatient.  Labs:  Lab Results  Component Value Date   WBC 7.4 04/27/2012   HGB 13.2 04/27/2012   HCT 37.2* 04/27/2012   MCV 94.4 04/27/2012   PLT 248 04/27/2012     Lab 04/22/12 0325 04/22/12 0316  NA 143 --  K 3.8 --  CL 109 --  CO2 -- 19  BUN 4* --  CREATININE 1.10 --  CALCIUM -- 8.9  PROT -- --  BILITOT -- --  ALKPHOS -- --  ALT -- --  AST -- --  GLUCOSE 80 --   Lab Results  Component Value Date  TROPONINI <0.30 04/22/2012    Basename 04/27/12 0510  INR 1.92*      Radiology: Dg Chest Port 1 View 04/22/2012  *RADIOLOGY REPORT*  Clinical Data: Shortness of breath and chest pain.  PORTABLE CHEST - 1 VIEW  Comparison: None.  Findings: The heart size and pulmonary vascularity are normal. The lungs appear clear and expanded without focal air space disease or consolidation. No blunting of the costophrenic angles.  No pneumothorax.  Mediastinal contours appear intact.  IMPRESSION: No evidence of active pulmonary disease.   Original Report Authenticated By: Marlon Pel, M.D.    EKG:23-Apr-2012 03:01:31 [facility] ROUTINE RECORD Normal sinus rhythm Possible Left atrial enlargement Inferior infarct , age undetermined Cannot rule out Anterior infarct , age undetermined T wave abnormality, consider lateral ischemia Abnormal ECG 41mm/s 68mm/mV 100Hz  8.0.1 12SL 239 CID: 1 Referred by: Tracyann Duffell J. Confirmed By: Verne Carrow MD Vent. rate 65 BPM PR interval 134 ms QRS duration 122 ms QT/QTc 404/420 ms P-R-T axes 64 81 -81    Echo: 04/22/2012 Study Conclusions - Left ventricle: LVEF is severely depressed at approximately 20% with akinesis of the mid/distal inferior, the inferoseptal, the mid/distal posterior and the apical walls; hypokinesis elsewhere. Echobright lesion along distal inferior/posterior wall consistent with mural thrombus. The cavity size was severely dilated. Wall thickness was normal. Features are consistent with a pseudonormal left ventricular filling pattern, with concomitant abnormal relaxation and increased filling pressure (grade 2 diastolic dysfunction). - Mitral valve: Mild regurgitation.    FOLLOW UP PLANS AND APPOINTMENTS Allergies  Allergen Reactions  . Bee Venom Anaphylaxis  . Penicillins Rash     Medication List     As of 04/27/2012  2:58 PM    TAKE these medications         aspirin 81 MG chewable tablet   Chew 1 tablet (81 mg total) by mouth daily.      carvedilol 3.125 MG tablet   Commonly known as: COREG   Take 1 tablet (3.125 mg total) by mouth 2 (two) times daily with a meal.      nitroGLYCERIN 0.4 MG SL tablet   Commonly known as: NITROSTAT   Place 1 tablet (0.4 mg total) under the tongue every 5 (five) minutes as needed for chest pain.      pravastatin 40 MG tablet   Commonly known as: PRAVACHOL   Take 1 tablet (40 mg total) by mouth every evening.      Ticagrelor 90 MG Tabs tablet   Commonly known as: BRILINTA   Take 1 tablet (90 mg total) by mouth  2 (two) times daily.      warfarin 5 MG tablet   Commonly known as: COUMADIN   Take 1.5 tablets (7.5 mg total) by mouth daily. Take 1-1/2 tabs Fri/Sat, 1 tab Sun/Mon, check Tues. Or as directed         Discharge Orders    Future Appointments: Provider: Department: Dept Phone: Center:   05/01/2012 10:00 AM [facility] [phone] [facility]   05/02/2012 10:00 AM [facility] [phone] None   05/10/2012 2:20 PM Prescott Parma, PA [facility] [phone] [facility]     Follow-up Information    Follow up with [facility]. On 05/01/2012. (Coumadin check at 10:00 am)    Contact information:   [address] [place] [address]       Follow up with Prescott Parma, PA. On 05/10/2012. (See for  Dr Diona Browner at 2:20 pm.)    Contact information:   [address], [address] [place] [address] [phone]       Follow up with [facility]. On 05/02/2012. (at 10:00 am)    Contact information:   [address] [place] [address]          BRING ALL MEDICATIONS WITH YOU TO FOLLOW UP APPOINTMENTS  Time spent with patient to include physician time: 35 min Signed: Theodore Demark 04/27/2012, 2:58 PM Co-Sign MD  See my note from earlier today.  [name], [name]., MD, [facility]

## 2012-04-27 NOTE — Progress Notes (Signed)
Pt being fitted with lifevest per cardiac RN . All other discharge papers have been gone over with pt and family. Pt aware of home meds, follow up appts, when to call DR's office, daily weights, CHF clinic appt, and INR lab draw appt.

## 2012-04-27 NOTE — Progress Notes (Signed)
Pt discharged per W/C accompanied by parents and nurse tech with all belongings and life vest on. Marisa Cyphers RN

## 2012-04-28 ENCOUNTER — Other Ambulatory Visit: Payer: Self-pay | Admitting: Physician Assistant

## 2012-05-02 ENCOUNTER — Ambulatory Visit (HOSPITAL_COMMUNITY): Payer: Self-pay | Attending: Internal Medicine

## 2012-05-08 ENCOUNTER — Ambulatory Visit (INDEPENDENT_AMBULATORY_CARE_PROVIDER_SITE_OTHER): Payer: Medicaid Other | Admitting: *Deleted

## 2012-05-08 DIAGNOSIS — I2589 Other forms of chronic ischemic heart disease: Secondary | ICD-10-CM

## 2012-05-08 DIAGNOSIS — I255 Ischemic cardiomyopathy: Secondary | ICD-10-CM

## 2012-05-08 DIAGNOSIS — I219 Acute myocardial infarction, unspecified: Secondary | ICD-10-CM

## 2012-05-08 DIAGNOSIS — I513 Intracardiac thrombosis, not elsewhere classified: Secondary | ICD-10-CM

## 2012-05-08 DIAGNOSIS — Z7901 Long term (current) use of anticoagulants: Secondary | ICD-10-CM | POA: Insufficient documentation

## 2012-05-10 ENCOUNTER — Ambulatory Visit (INDEPENDENT_AMBULATORY_CARE_PROVIDER_SITE_OTHER): Payer: Medicaid Other | Admitting: Physician Assistant

## 2012-05-10 ENCOUNTER — Encounter: Payer: Self-pay | Admitting: Physician Assistant

## 2012-05-10 VITALS — BP 106/77 | HR 80 | Ht 68.0 in | Wt 155.8 lb

## 2012-05-10 DIAGNOSIS — I2589 Other forms of chronic ischemic heart disease: Secondary | ICD-10-CM

## 2012-05-10 DIAGNOSIS — I255 Ischemic cardiomyopathy: Secondary | ICD-10-CM

## 2012-05-10 DIAGNOSIS — I513 Intracardiac thrombosis, not elsewhere classified: Secondary | ICD-10-CM

## 2012-05-10 DIAGNOSIS — E785 Hyperlipidemia, unspecified: Secondary | ICD-10-CM

## 2012-05-10 DIAGNOSIS — I219 Acute myocardial infarction, unspecified: Secondary | ICD-10-CM

## 2012-05-10 MED ORDER — CLOPIDOGREL BISULFATE 75 MG PO TABS: 75.0000 mg | ORAL_TABLET | Freq: Every day | ORAL | Status: DC

## 2012-05-10 MED ORDER — CARVEDILOL 6.25 MG PO TABS: 6.2500 mg | ORAL_TABLET | Freq: Two times a day (BID) | ORAL | Status: DC

## 2012-05-10 NOTE — Assessment & Plan Note (Signed)
Recently diagnosed by echocardiography. Patient to continue on Coumadin anticoagulation, followed here in our clinic, pending further recommendations.

## 2012-05-10 NOTE — Patient Instructions (Addendum)
   Begin Plavix 75mg  daily after finish current supply of Brilinta  Continue current cholesterol medication - Pravastatin 40mg  every evening  Echo - can be done prior to next office visit  Stop Aspirn  Increase Coreg to 6.25mg  twice a day  (may take 2 of your 3.125mg  tabs twice a day till finish current supply)  Continue all other current medications.   Nurse visit in 2 weeks for blood pressure & heart rate check Follow up in  3 months

## 2012-05-10 NOTE — Assessment & Plan Note (Signed)
Will increase carvedilol to 6.25 twice a day, with target 25 twice a day, if BP allows. RN visit in 2 weeks for BP/HR check. ASA to be discontinued, given that patient is now on Coumadin and Brilinta. Of note, patient is about to run out of the latter, which he reports compliance with since his acute MI, back in July. Will finish Brilinta, then start Plavix 75 daily, given his severe financial constraints. Will also order a followup 2-D echo in 3 months, for reassessment of LVF. If still less than 35%, then recommendation would be to refer him to our EP team for consideration of ICD implantation. In the meanwhile, he is to remain on the LifeVest.
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
## 2012-05-10 NOTE — Assessment & Plan Note (Addendum)
Patient to initiate statin therapy with Pravachol 40 daily (which he had not yet filled, citing financial constraints), to be obtained at [facility] for 4 hours/month. Will need followup lipid profile in 12 weeks.

## 2012-05-11 ENCOUNTER — Other Ambulatory Visit: Payer: Self-pay | Admitting: *Deleted

## 2012-05-11 DIAGNOSIS — I509 Heart failure, unspecified: Secondary | ICD-10-CM

## 2012-05-14 ENCOUNTER — Ambulatory Visit (HOSPITAL_COMMUNITY)
Admission: RE | Admit: 2012-05-14 | Discharge: 2012-05-14 | Disposition: A | Discharge status: Home / Self Care | Payer: Medicaid Other | Source: Ambulatory Visit | Attending: Internal Medicine | Admitting: Internal Medicine

## 2012-05-14 ENCOUNTER — Encounter (HOSPITAL_COMMUNITY): Payer: Self-pay

## 2012-05-14 VITALS — BP 110/80 | HR 73 | Ht 68.0 in | Wt 152.1 lb

## 2012-05-14 DIAGNOSIS — Z7901 Long term (current) use of anticoagulants: Secondary | ICD-10-CM | POA: Insufficient documentation

## 2012-05-14 DIAGNOSIS — E785 Hyperlipidemia, unspecified: Secondary | ICD-10-CM | POA: Insufficient documentation

## 2012-05-14 DIAGNOSIS — Z91038 Other insect allergy status: Secondary | ICD-10-CM | POA: Insufficient documentation

## 2012-05-14 DIAGNOSIS — I2510 Atherosclerotic heart disease of native coronary artery without angina pectoris: Secondary | ICD-10-CM | POA: Insufficient documentation

## 2012-05-14 DIAGNOSIS — I252 Old myocardial infarction: Secondary | ICD-10-CM | POA: Insufficient documentation

## 2012-05-14 DIAGNOSIS — K219 Gastro-esophageal reflux disease without esophagitis: Secondary | ICD-10-CM | POA: Insufficient documentation

## 2012-05-14 DIAGNOSIS — Z9119 Patient's noncompliance with other medical treatment and regimen: Secondary | ICD-10-CM | POA: Insufficient documentation

## 2012-05-14 DIAGNOSIS — Z880 Allergy status to penicillin: Secondary | ICD-10-CM | POA: Insufficient documentation

## 2012-05-14 DIAGNOSIS — I509 Heart failure, unspecified: Secondary | ICD-10-CM | POA: Insufficient documentation

## 2012-05-14 DIAGNOSIS — I255 Ischemic cardiomyopathy: Secondary | ICD-10-CM

## 2012-05-14 DIAGNOSIS — I2589 Other forms of chronic ischemic heart disease: Secondary | ICD-10-CM | POA: Insufficient documentation

## 2012-05-14 DIAGNOSIS — R55 Syncope and collapse: Secondary | ICD-10-CM

## 2012-05-14 DIAGNOSIS — Z91199 Patient's noncompliance with other medical treatment and regimen due to unspecified reason: Secondary | ICD-10-CM | POA: Insufficient documentation

## 2012-05-14 DIAGNOSIS — F17200 Nicotine dependence, unspecified, uncomplicated: Secondary | ICD-10-CM | POA: Insufficient documentation

## 2012-05-14 DIAGNOSIS — I5022 Chronic systolic (congestive) heart failure: Secondary | ICD-10-CM | POA: Insufficient documentation

## 2012-05-14 NOTE — Patient Instructions (Addendum)
Follow up in 2 weeks with Dr Gala Romney  We will schedule an appointment with Dr Graciela Husbands   Please schedule exercise test  Take lisinopril 5 mg at night  Take Digoxin 0.125 mg daily   Do the following things EVERYDAY: 1) Weigh yourself in the morning before breakfast. Write it down and keep it in a log. 2) Take your medicines as prescribed 3) Eat low salt foods-Limit salt (sodium) to 2000 mg per day.  4) Stay as active as you can everyday 5) Limit all fluids for the day to less than 2 liters

## 2012-05-14 NOTE — Assessment & Plan Note (Addendum)
Syncopal episode 2 days ago. Instructed to transmit to Lifevest today for review. Continue Lifevest. Reviewed EKG form 04/22/12 QRS 118. Refer to Dr Graciela Husbands for defibrillator.   Attending: Suspect syncope due to orthostasis. Have interrogated LifeVest and await results. Refer to EP for ICD.

## 2012-05-14 NOTE — Assessment & Plan Note (Addendum)
No evidence of ischemia. Continue current regimen. Will need cardiac rehab once Medicaid approved. He has no transportation to cardiac rehab.   Attending: Agree.

## 2012-05-14 NOTE — Assessment & Plan Note (Signed)
Discussed smoking cessation in the event he needs advanced therapies. Offered smoking cessation options however he would like to try and stop on his own.

## 2012-05-14 NOTE — Assessment & Plan Note (Addendum)
Explained purpose of HF clinic. NYHA III-IIIb. He reports dyspnea on inclined area. Volume status stable. Start lisinopril 5 mg at bed time and digoxin 0.125 mg daily. He is unable to pay for medications. Provided lisinopril and digoxin from [facility] indigent HF medication fund. Schedule CPX. Follow up in 2 weeks with Dr Gala Romney. Will repeat BMET at that time.   Patient seen and examined with Tonye Becket, NP. We discussed all aspects of the encounter. I agree with the assessment and plan as stated above. He has advanced HF due to a severe ischemic CM. NYHA IIIb with intolerance of ACE-I due to hypotension. Will schedule CPX to objectively evaluate. Attempt to restart low-dose lisinopril. Long talk about possibility of advanced therapies and absolute need to stop smoking. Will see back after CPX. Will refer to EP for ICD.
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
## 2012-05-14 NOTE — Assessment & Plan Note (Signed)
Continue coumadin.  

## 2012-05-18 ENCOUNTER — Ambulatory Visit (INDEPENDENT_AMBULATORY_CARE_PROVIDER_SITE_OTHER): Payer: Medicaid Other | Admitting: *Deleted

## 2012-05-18 DIAGNOSIS — Z7901 Long term (current) use of anticoagulants: Secondary | ICD-10-CM

## 2012-05-18 DIAGNOSIS — I2589 Other forms of chronic ischemic heart disease: Secondary | ICD-10-CM

## 2012-05-18 DIAGNOSIS — I513 Intracardiac thrombosis, not elsewhere classified: Secondary | ICD-10-CM

## 2012-05-18 DIAGNOSIS — I219 Acute myocardial infarction, unspecified: Secondary | ICD-10-CM

## 2012-05-18 DIAGNOSIS — I255 Ischemic cardiomyopathy: Secondary | ICD-10-CM

## 2012-05-23 ENCOUNTER — Encounter (HOSPITAL_COMMUNITY): Payer: Self-pay

## 2012-05-30 ENCOUNTER — Encounter (HOSPITAL_COMMUNITY): Payer: Self-pay

## 2012-06-11 ENCOUNTER — Ambulatory Visit (HOSPITAL_COMMUNITY): Payer: Medicaid Other | Attending: Internal Medicine

## 2012-06-11 DIAGNOSIS — I5022 Chronic systolic (congestive) heart failure: Secondary | ICD-10-CM

## 2012-06-11 DIAGNOSIS — I2589 Other forms of chronic ischemic heart disease: Secondary | ICD-10-CM

## 2012-06-11 DIAGNOSIS — I428 Other cardiomyopathies: Secondary | ICD-10-CM | POA: Insufficient documentation

## 2012-06-11 DIAGNOSIS — I255 Ischemic cardiomyopathy: Secondary | ICD-10-CM

## 2012-06-11 LAB — ICD DEVICE OBSERVATION

## 2012-06-14 ENCOUNTER — Ambulatory Visit (HOSPITAL_COMMUNITY)
Admission: RE | Admit: 2012-06-14 | Discharge: 2012-06-14 | Disposition: A | Discharge status: Home / Self Care | Payer: Medicaid Other | Source: Ambulatory Visit | Attending: Internal Medicine | Admitting: Internal Medicine

## 2012-06-14 ENCOUNTER — Ambulatory Visit: Payer: Self-pay | Admitting: Internal Medicine

## 2012-06-14 ENCOUNTER — Encounter (HOSPITAL_COMMUNITY): Payer: Self-pay

## 2012-06-14 VITALS — BP 112/78 | HR 81 | Wt 153.5 lb

## 2012-06-14 DIAGNOSIS — I4892 Unspecified atrial flutter: Secondary | ICD-10-CM | POA: Insufficient documentation

## 2012-06-14 DIAGNOSIS — I2510 Atherosclerotic heart disease of native coronary artery without angina pectoris: Secondary | ICD-10-CM | POA: Insufficient documentation

## 2012-06-14 DIAGNOSIS — Z720 Tobacco use: Secondary | ICD-10-CM

## 2012-06-14 DIAGNOSIS — I5022 Chronic systolic (congestive) heart failure: Secondary | ICD-10-CM | POA: Insufficient documentation

## 2012-06-14 DIAGNOSIS — F17200 Nicotine dependence, unspecified, uncomplicated: Secondary | ICD-10-CM | POA: Insufficient documentation

## 2012-06-14 DIAGNOSIS — I509 Heart failure, unspecified: Secondary | ICD-10-CM

## 2012-06-14 MED ORDER — CARVEDILOL 6.25 MG PO TABS: 9.3750 mg | ORAL_TABLET | Freq: Two times a day (BID) | ORAL | Status: DC

## 2012-06-14 NOTE — Assessment & Plan Note (Addendum)
NYHA II-III B. Volume status stable. Discussed CPX results which revealed mild-moderate circulatory limitations. Peak VO2 21.9. No role for advanced therapies at the present time will continue to titrate HF medications. Increase carvedilol 9.375 mg twice a day. Provided scale. Reinforced daily weights, low salt food intake, medication compliance, and limiting fluid intake to less than 2 liters. Follow up in 3 weeks and continue to titrate HF medications.
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
## 2012-06-14 NOTE — Assessment & Plan Note (Signed)
No evidence of ischemia. Continue current regimen. Will need cardiac rehab once Medicaid approved but he has transportation to cardiac rehab.

## 2012-06-14 NOTE — Patient Instructions (Addendum)
Follow up in 3 weeks  Take Carvedilol 9.375 mg twice a day  Quit smoking   Do the following things EVERYDAY: 1) Weigh yourself in the morning before breakfast. Write it down and keep it in a log. 2) Take your medicines as prescribed 3) Eat low salt foods-Limit salt (sodium) to 2000 mg per day.  4) Stay as active as you can everyday 5) Limit all fluids for the day to less than 2 liters

## 2012-06-14 NOTE — Assessment & Plan Note (Signed)
He currently declines smoking cessation.

## 2012-06-15 NOTE — Addendum Note (Signed)
Encounter addended by: Noralee Space, RN on: 06/15/2012 12:14 PM<BR>     Documentation filed: Orders

## 2012-06-18 ENCOUNTER — Telehealth: Payer: Self-pay | Admitting: *Deleted

## 2012-06-18 NOTE — Telephone Encounter (Signed)
Pt. Was enrolled for 14 day event monitor 06/18/12. [place] papers were rec'd  Per [facility]ewatch.

## 2012-06-19 ENCOUNTER — Encounter: Payer: Self-pay | Admitting: Internal Medicine

## 2012-06-19 ENCOUNTER — Encounter: Payer: Self-pay | Admitting: *Deleted

## 2012-06-19 ENCOUNTER — Ambulatory Visit (INDEPENDENT_AMBULATORY_CARE_PROVIDER_SITE_OTHER): Payer: Self-pay | Admitting: Internal Medicine

## 2012-06-19 VITALS — BP 100/70 | HR 64 | Ht 68.0 in | Wt 156.0 lb

## 2012-06-19 DIAGNOSIS — I255 Ischemic cardiomyopathy: Secondary | ICD-10-CM

## 2012-06-19 DIAGNOSIS — F17200 Nicotine dependence, unspecified, uncomplicated: Secondary | ICD-10-CM

## 2012-06-19 DIAGNOSIS — I5022 Chronic systolic (congestive) heart failure: Secondary | ICD-10-CM

## 2012-06-19 DIAGNOSIS — I2589 Other forms of chronic ischemic heart disease: Secondary | ICD-10-CM

## 2012-06-19 DIAGNOSIS — Z720 Tobacco use: Secondary | ICD-10-CM

## 2012-06-19 DIAGNOSIS — I509 Heart failure, unspecified: Secondary | ICD-10-CM

## 2012-06-19 LAB — CBC WITH DIFFERENTIAL/PLATELET
Basophils Absolute: 0 10*3/uL (ref 0.0–0.1)
Eosinophils Absolute: 0.1 10*3/uL (ref 0.0–0.7)
Hemoglobin: 14.8 g/dL (ref 13.0–17.0)
Lymphocytes Relative: 26.7 % (ref 12.0–46.0)
MCHC: 34.1 g/dL (ref 30.0–36.0)
Monocytes Relative: 7.6 % (ref 3.0–12.0)
Neutrophils Relative %: 64.8 % (ref 43.0–77.0)
Platelets: 231 10*3/uL (ref 150.0–400.0)
RDW: 13.8 % (ref 11.5–14.6)

## 2012-06-19 LAB — BASIC METABOLIC PANEL
CO2: 22 mEq/L (ref 19–32)
Chloride: 105 mEq/L (ref 96–112)
Creatinine, Ser: 0.8 mg/dL (ref 0.4–1.5)
Potassium: 4.1 mEq/L (ref 3.5–5.1)
Sodium: 136 mEq/L (ref 135–145)

## 2012-06-19 LAB — APTT: aPTT: 29.2 s — ABNORMAL HIGH (ref 21.7–28.8)

## 2012-06-19 LAB — PROTIME-INR
INR: 1 ratio (ref 0.8–1.0)
Prothrombin Time: 11 s (ref 10.2–12.4)
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
## 2012-06-19 NOTE — Assessment & Plan Note (Signed)
He continues is class III symptoms. He is followed by the heart failure clinic

## 2012-06-19 NOTE — Assessment & Plan Note (Signed)
We discussed tobacco discontinuation the role of patches of lozenges.

## 2012-06-19 NOTE — Assessment & Plan Note (Signed)
The patient has ischemic cardiomyopathy with persistent left ventricular dysfunction despite an interval now of 5 months and medical therapy limited by blood pressure. He has been wearing a LifeVest following an episode of syncope. He is appropriate at this point to proceed with ICD implantation. We have reviewed risks and benefits including but not limited to death perforation infection and lead dislodgment. He understands these risks and is willing to proceed. He is a narrow QRS. There have been issues of flutter and rapid rates detected on his LifeVest and so a dual-chamber device will be implanted.

## 2012-06-22 ENCOUNTER — Encounter (HOSPITAL_COMMUNITY): Payer: Self-pay | Admitting: Pharmacy Technician

## 2012-06-26 ENCOUNTER — Encounter (INDEPENDENT_AMBULATORY_CARE_PROVIDER_SITE_OTHER): Payer: Medicaid Other

## 2012-06-26 DIAGNOSIS — I4892 Unspecified atrial flutter: Secondary | ICD-10-CM

## 2012-06-26 NOTE — Progress Notes (Signed)
Patient ID: Logan [name], male   DOB: 12-Jul-1975, 36 y.o.   MRN: [id number] Placed a event monitor on patient for 14 days. Also went over how to use monitor and when to mailed back

## 2012-07-08 MED ORDER — VANCOMYCIN HCL IN DEXTROSE 1-5 GM/200ML-% IV SOLN
1000.0000 mg | INTRAVENOUS | Status: DC
  Filled 2012-07-08: qty 200

## 2012-07-08 MED ORDER — GENTAMICIN SULFATE 40 MG/ML IJ SOLN
80.0000 mg | INTRAMUSCULAR | Status: DC
  Filled 2012-07-08: qty 2

## 2012-07-09 ENCOUNTER — Telehealth: Payer: Self-pay

## 2012-07-09 ENCOUNTER — Ambulatory Visit (HOSPITAL_COMMUNITY): Payer: Medicaid Other

## 2012-07-09 ENCOUNTER — Encounter (HOSPITAL_COMMUNITY)
Admission: RE | Disposition: A | Discharge status: Home / Self Care | Payer: Self-pay | Source: Ambulatory Visit | Attending: Internal Medicine

## 2012-07-09 ENCOUNTER — Encounter (HOSPITAL_COMMUNITY): Payer: Self-pay | Admitting: General Practice

## 2012-07-09 ENCOUNTER — Inpatient Hospital Stay (HOSPITAL_COMMUNITY)
Admission: RE | Admit: 2012-07-09 | Discharge: 2012-07-21 | DRG: 234 | Disposition: A | Discharge status: Home / Self Care | Payer: Medicaid Other | Source: Ambulatory Visit | Attending: Cardiothoracic Surgery | Admitting: Cardiothoracic Surgery

## 2012-07-09 DIAGNOSIS — I2589 Other forms of chronic ischemic heart disease: Principal | ICD-10-CM | POA: Diagnosis present

## 2012-07-09 DIAGNOSIS — E785 Hyperlipidemia, unspecified: Secondary | ICD-10-CM

## 2012-07-09 DIAGNOSIS — J4489 Other specified chronic obstructive pulmonary disease: Secondary | ICD-10-CM | POA: Diagnosis present

## 2012-07-09 DIAGNOSIS — I2510 Atherosclerotic heart disease of native coronary artery without angina pectoris: Secondary | ICD-10-CM | POA: Diagnosis present

## 2012-07-09 DIAGNOSIS — E119 Type 2 diabetes mellitus without complications: Secondary | ICD-10-CM | POA: Diagnosis not present

## 2012-07-09 DIAGNOSIS — I513 Intracardiac thrombosis, not elsewhere classified: Secondary | ICD-10-CM

## 2012-07-09 DIAGNOSIS — I252 Old myocardial infarction: Secondary | ICD-10-CM

## 2012-07-09 DIAGNOSIS — F329 Major depressive disorder, single episode, unspecified: Secondary | ICD-10-CM | POA: Diagnosis present

## 2012-07-09 DIAGNOSIS — F17200 Nicotine dependence, unspecified, uncomplicated: Secondary | ICD-10-CM | POA: Diagnosis present

## 2012-07-09 DIAGNOSIS — I514 Myocarditis, unspecified: Secondary | ICD-10-CM | POA: Diagnosis present

## 2012-07-09 DIAGNOSIS — I5022 Chronic systolic (congestive) heart failure: Secondary | ICD-10-CM | POA: Diagnosis present

## 2012-07-09 DIAGNOSIS — I255 Ischemic cardiomyopathy: Secondary | ICD-10-CM

## 2012-07-09 DIAGNOSIS — I498 Other specified cardiac arrhythmias: Secondary | ICD-10-CM | POA: Diagnosis present

## 2012-07-09 DIAGNOSIS — I5189 Other ill-defined heart diseases: Secondary | ICD-10-CM | POA: Diagnosis present

## 2012-07-09 DIAGNOSIS — Z7901 Long term (current) use of anticoagulants: Secondary | ICD-10-CM

## 2012-07-09 DIAGNOSIS — I200 Unstable angina: Secondary | ICD-10-CM

## 2012-07-09 DIAGNOSIS — I509 Heart failure, unspecified: Secondary | ICD-10-CM | POA: Diagnosis present

## 2012-07-09 DIAGNOSIS — Z951 Presence of aortocoronary bypass graft: Secondary | ICD-10-CM

## 2012-07-09 DIAGNOSIS — D62 Acute posthemorrhagic anemia: Secondary | ICD-10-CM | POA: Diagnosis not present

## 2012-07-09 DIAGNOSIS — K219 Gastro-esophageal reflux disease without esophagitis: Secondary | ICD-10-CM | POA: Diagnosis present

## 2012-07-09 DIAGNOSIS — J449 Chronic obstructive pulmonary disease, unspecified: Secondary | ICD-10-CM | POA: Diagnosis present

## 2012-07-09 DIAGNOSIS — I059 Rheumatic mitral valve disease, unspecified: Secondary | ICD-10-CM | POA: Diagnosis present

## 2012-07-09 DIAGNOSIS — Z79899 Other long term (current) drug therapy: Secondary | ICD-10-CM

## 2012-07-09 DIAGNOSIS — F3289 Other specified depressive episodes: Secondary | ICD-10-CM | POA: Diagnosis present

## 2012-07-09 HISTORY — PX: LEFT HEART CATHETERIZATION WITH CORONARY ANGIOGRAM: SHX5451

## 2012-07-09 HISTORY — DX: Angina pectoris, unspecified: I20.9

## 2012-07-09 HISTORY — PX: CARDIAC CATHETERIZATION: SHX172

## 2012-07-09 LAB — CBC
HCT: 44 % (ref 39.0–52.0)
HCT: 41.2 % (ref 39.0–52.0)
Hemoglobin: 14.8 g/dL (ref 13.0–17.0)
MCHC: 36.4 g/dL — ABNORMAL HIGH (ref 30.0–36.0)
MCV: 95.4 fL (ref 78.0–100.0)
MCV: 95.2 fL (ref 78.0–100.0)
Platelets: 338 10*3/uL (ref 150–400)
RDW: 13.6 % (ref 11.5–15.5)
RDW: 13.4 % (ref 11.5–15.5)
WBC: 7.7 10*3/uL (ref 4.0–10.5)
WBC: 6.8 10*3/uL (ref 4.0–10.5)

## 2012-07-09 LAB — PROTIME-INR: Prothrombin Time: 12.7 seconds (ref 11.6–15.2)

## 2012-07-09 LAB — BASIC METABOLIC PANEL
BUN: 8 mg/dL (ref 6–23)
Calcium: 9.2 mg/dL (ref 8.4–10.5)
Creatinine, Ser: 0.87 mg/dL (ref 0.50–1.35)
GFR calc Af Amer: >90 mL/min (ref >90)
GFR calc non Af Amer: >90 mL/min (ref >90)

## 2012-07-09 LAB — CREATININE, SERUM
GFR calc Af Amer: >90 mL/min (ref >90)
GFR calc non Af Amer: >90 mL/min (ref >90)

## 2012-07-09 LAB — SURGICAL PCR SCREEN: Staphylococcus aureus: NEGATIVE

## 2012-07-09 SURGERY — LEFT HEART CATHETERIZATION WITH CORONARY ANGIOGRAM
Anesthesia: LOCAL

## 2012-07-09 MED ORDER — CARVEDILOL 6.25 MG PO TABS
9.3750 mg | ORAL_TABLET | Freq: Two times a day (BID) | ORAL | Status: DC
  Administered 2012-07-09 – 2012-07-14 (×6): 9.375 mg via ORAL
  Filled 2012-07-09 (×14): qty 1

## 2012-07-09 MED ORDER — SODIUM CHLORIDE 0.9 % IV SOLN: INTRAVENOUS | Status: DC

## 2012-07-09 MED ORDER — SODIUM CHLORIDE 0.9 % IJ SOLN: 3.0000 mL | Freq: Two times a day (BID) | INTRAMUSCULAR | Status: DC

## 2012-07-09 MED ORDER — VERAPAMIL HCL 2.5 MG/ML IV SOLN
INTRAVENOUS | Status: AC
  Filled 2012-07-09: qty 2

## 2012-07-09 MED ORDER — SODIUM CHLORIDE 0.9 % IJ SOLN: 3.0000 mL | INTRAMUSCULAR | Status: DC | PRN

## 2012-07-09 MED ORDER — MUPIROCIN 2 % EX OINT: TOPICAL_OINTMENT | Freq: Two times a day (BID) | CUTANEOUS | Status: DC

## 2012-07-09 MED ORDER — HEPARIN SODIUM (PORCINE) 1000 UNIT/ML IJ SOLN
INTRAMUSCULAR | Status: AC
  Filled 2012-07-09: qty 1

## 2012-07-09 MED ORDER — MUPIROCIN 2 % EX OINT
TOPICAL_OINTMENT | CUTANEOUS | Status: AC
  Filled 2012-07-09: qty 22

## 2012-07-09 MED ORDER — HEPARIN SODIUM (PORCINE) 5000 UNIT/ML IJ SOLN
5000.0000 [IU] | Freq: Three times a day (TID) | INTRAMUSCULAR | Status: DC
  Filled 2012-07-09 (×2): qty 1

## 2012-07-09 MED ORDER — SODIUM CHLORIDE 0.45 % IV SOLN: INTRAVENOUS | Status: DC

## 2012-07-09 MED ORDER — NITROGLYCERIN 0.2 MG/ML ON CALL CATH LAB
INTRAVENOUS | Status: AC
  Filled 2012-07-09: qty 1

## 2012-07-09 MED ORDER — SODIUM CHLORIDE 0.9 % IV SOLN
1.0000 mL/kg/h | INTRAVENOUS | Status: AC
  Administered 2012-07-09: 16:00:00 1 mL/kg/h via INTRAVENOUS

## 2012-07-09 MED ORDER — DIGOXIN 125 MCG PO TABS
0.1250 mg | ORAL_TABLET | Freq: Every morning | ORAL | Status: DC
  Administered 2012-07-10 – 2012-07-15 (×6): 0.125 mg via ORAL
  Filled 2012-07-09 (×7): qty 1

## 2012-07-09 MED ORDER — SODIUM CHLORIDE 0.9 % IV SOLN: 250.0000 mL | INTRAVENOUS | Status: DC

## 2012-07-09 MED ORDER — ASPIRIN 81 MG PO CHEW
81.0000 mg | CHEWABLE_TABLET | Freq: Every day | ORAL | Status: DC
  Administered 2012-07-10 – 2012-07-11 (×2): 81 mg via ORAL
  Filled 2012-07-09 (×2): qty 1

## 2012-07-09 MED ORDER — HEPARIN (PORCINE) IN NACL 100-0.45 UNIT/ML-% IJ SOLN
1150.0000 [IU]/h | INTRAMUSCULAR | Status: DC
Start: 2012-07-09 — End: 2012-07-12
  Administered 2012-07-09: 950 [IU]/h via INTRAVENOUS
  Administered 2012-07-10: 1100 [IU]/h via INTRAVENOUS
  Administered 2012-07-10 – 2012-07-11 (×2): 1150 [IU]/h via INTRAVENOUS
  Filled 2012-07-09 (×5): qty 250

## 2012-07-09 MED ORDER — ONDANSETRON HCL 4 MG/2ML IJ SOLN: 4.0000 mg | Freq: Four times a day (QID) | INTRAMUSCULAR | Status: DC | PRN

## 2012-07-09 MED ORDER — HEPARIN (PORCINE) IN NACL 2-0.9 UNIT/ML-% IJ SOLN
INTRAMUSCULAR | Status: AC
Start: 2012-07-09 — End: 2012-07-09
  Filled 2012-07-09: qty 1000

## 2012-07-09 MED ORDER — SODIUM CHLORIDE 0.9 % IV SOLN: 250.0000 mL | INTRAVENOUS | Status: DC | PRN

## 2012-07-09 MED ORDER — ASPIRIN 81 MG PO CHEW: 324.0000 mg | CHEWABLE_TABLET | ORAL | Status: DC

## 2012-07-09 MED ORDER — LISINOPRIL 5 MG PO TABS
5.0000 mg | ORAL_TABLET | Freq: Every day | ORAL | Status: DC
  Administered 2012-07-09 – 2012-07-15 (×7): 5 mg via ORAL
  Filled 2012-07-09 (×8): qty 1

## 2012-07-09 MED ORDER — VANCOMYCIN HCL IN DEXTROSE 1-5 GM/200ML-% IV SOLN
INTRAVENOUS | Status: AC
  Filled 2012-07-09: qty 200

## 2012-07-09 MED ORDER — MIDAZOLAM HCL 2 MG/2ML IJ SOLN
INTRAMUSCULAR | Status: AC
  Filled 2012-07-09: qty 2

## 2012-07-09 MED ORDER — NITROGLYCERIN 0.4 MG SL SUBL: 0.4000 mg | SUBLINGUAL_TABLET | SUBLINGUAL | Status: DC | PRN

## 2012-07-09 MED ORDER — LIDOCAINE HCL (PF) 1 % IJ SOLN
INTRAMUSCULAR | Status: AC
  Filled 2012-07-09: qty 30

## 2012-07-09 MED ORDER — CHLORHEXIDINE GLUCONATE 4 % EX LIQD: 60.0000 mL | Freq: Once | CUTANEOUS | Status: DC

## 2012-07-09 MED ORDER — ACETAMINOPHEN 325 MG PO TABS: 650.0000 mg | ORAL_TABLET | ORAL | Status: DC | PRN

## 2012-07-09 MED ORDER — FENTANYL CITRATE 0.05 MG/ML IJ SOLN
INTRAMUSCULAR | Status: AC
  Filled 2012-07-09: qty 2

## 2012-07-09 NOTE — Telephone Encounter (Signed)
Received call from [employer] stating patient was mailed a hard ship request form and patient has never responded.States they are going to cancel order for monitor.

## 2012-07-09 NOTE — Progress Notes (Signed)
Utilization Review Completed.   Saydee Zolman, RN, BSN Nurse Case Manager  [phone]  

## 2012-07-09 NOTE — Interval H&P Note (Signed)
History and Physical Interval Note:  07/09/2012 2:30 PM  Logan [name]  has presented today for surgery, with the diagnosis of hf/syncope  The various methods of treatment have been discussed with the patient and family. After consideration of risks, benefits and other options for treatment, the patient has consented to  Procedure(s) (LRB) with comments: LEFT HEART CATHETERIZATION WITH CORONARY ANGIOGRAM (N/A) as a surgical intervention .  The patient's history has been reviewed, patient examined, no change in status, stable for surgery.  I have reviewed the patient's chart and labs.  Questions were answered to the patient's satisfaction.    Pt complaining of exertional chest pain/pressure, new from Dr Odessa Fleming eval. Because of new symptoms, known CAD, cardiac cath possible PCI recommended by Dr Graciela Husbands prior to ICD implantation.  Tonny Bollman

## 2012-07-09 NOTE — CV Procedure (Signed)
   Cardiac Catheterization Procedure Note  Name: Sudeep Scheibel MRN: [id number] DOB: 02/10/1976  Procedure: Catheter placement, Selective Coronary Angiography  Indication: Unstable Angina   Procedural Details: The right wrist was prepped, draped, and anesthetized with 1% lidocaine. Using the modified Seldinger technique, a 5 French sheath was introduced into the right radial artery. 3 mg of verapamil was administered through the sheath, weight-based unfractionated heparin was administered intravenously. Standard Judkins catheters were used for selective coronary angiography. Catheter exchanges were performed over an exchange length guidewire. There were no immediate procedural complications. A TR band was used for radial hemostasis at the completion of the procedure.  The patient was transferred to the post catheterization recovery area for further monitoring.  Procedural Findings: Hemodynamics: AO 95/65  Coronary angiography: Coronary dominance: right  Left mainstem: Patent with minor irregularity  Left anterior descending (LAD): Severe proximal edge stenosis in the prox LAD of 80%. Diag 1 has 90% ostial stenosis. Remaining portions of mid and distal LAD are patent.  Left circumflex (LCx): Diffuse irregular 70% of the prox/mid-LCx. Large OM1 branch without significant stenosis.  Right coronary artery (RCA): Severe diffuse disease of the mid-vessel with late filling of the acute marginal branch and left-to-right collaterals filling the PDA and PLA branches  Left ventriculography:deferred  Final Conclusions:   1. Severe 3 vessel CAD with severe ISR of the prox LAD, diffuse LCx stenosis, and chronic occlusion of the RCA with left-right collaterals 2. Known severe ischemic cardiomyopathy  Recommendations: Considering prior hx of stent thrombosis, large area of myocardium in jeopardy, and multivessel CAD, I would favor surgical evaluation for consideration of CABG. Will discuss with Dr  Gala Romney. Will admit the patient and request inpatient TCTS consultation as the patient has progressive anginal symptoms.  Tonny Bollman 07/09/2012, 3:18 PM

## 2012-07-09 NOTE — Plan of Care (Signed)
Problem: Consults Goal: Cardiac Cath Patient Education (See Patient Education module for education specifics.) Outcome: Completed/Met Date Met:  07/09/12 Patient given radial site care instructions and information reviewed with good verbalized understanding  Problem: Phase I Progression Outcomes Goal: Pain controlled with appropriate interventions Outcome: Completed/Met Date Met:  07/09/12 No complaints of pain today at cath site or complaints of chest pain Goal: Initial discharge plan identified Outcome: Completed/Met Date Met:  07/09/12 Reviewed cath site care for discharge instructions Goal: Hemodynamically stable Outcome: Completed/Met Date Met:  07/09/12 Vital signs stable, blood pressure 100-110's/ 50-70. Sinus rhythm in 70's on monitor with no ectopy. O2 sats 98% on room air. Goal: Distal pulses equal to baseline Outcome: Completed/Met Date Met:  07/09/12 Peripheral pulses +2, palpable radial and pedal bilaterally Goal: Vascular site scale level 0 - I Vascular Site Scale Level 0: No bruising/bleeding/hematoma Level I (Mild): Bruising/Ecchymosis, minimal bleeding/ooozing, palpable hematoma < 3 cm Level II (Moderate): Bleeding not affecting hemodynamic parameters, pseudoaneurysm, palpable hematoma > 3 cm Outcome: Completed/Met Date Met:  07/09/12 Right radial site level 0 with no bruising, hematoma, swelling or bleeding noted. Positive reverse allens and CSM's wnls without change.

## 2012-07-09 NOTE — Interval H&P Note (Signed)
History and Physical Interval Note:  07/09/2012 10:40 AM  Logan [name]  has presented today for surgery, with the diagnosis of hf/syncope  The various methods of treatment have been discussed with the patient and family. After consideration of risks, benefits and other options for treatment, the patient has consented to  Procedure(s) (LRB) with comments: IMPLANTABLE CARDIOVERTER DEFIBRILLATOR IMPLANT (N/A) as a surgical intervention .  The patient's history has been reviewed, patient examined, no change in status, stable for surgery.  I have reviewed the patient's chart and labs.  Questions were answered to the patient's satisfaction.     Sherryl Manges  Pt with worsenign sob over the last week or two accompanied by new onset chest pressure consistent with angina.  Discomfort is qualitatively similar to but quantitatively much less than his MI pain Continues to smoke.  No edema no PND or orthopnea.  Have discussed wth pt and Dr Cooper>> will cancel ICD and proceed with cath

## 2012-07-09 NOTE — Progress Notes (Signed)
ANTICOAGULATION CONSULT NOTE - Initial Consult  Pharmacy Consult for heparin Indication: h/o LV thrombus  Allergies  Allergen Reactions  . Adhesive (Tape) Rash    "paper tape is ok" (07/09/2012)  . Bee Venom Anaphylaxis  . Penicillins Rash    Patient Measurements: Height: 5\' 8"  (172.7 cm) Weight: 148 lb (67.132 kg) IBW/kg (Calculated) : 68.4   Vital Signs: Temp: 97.7 F (36.5 C) (01/06 2020) Temp src: Oral (01/06 2020) BP: 101/52 mmHg (01/06 2020) Pulse Rate: 74  (01/06 2020)  Labs:  Basename 07/09/12 1547 07/09/12 1326 07/09/12 1038  HGB 14.8 -- 16.0  HCT 41.2 -- 44.0  PLT 258 -- 338  APTT -- -- --  LABPROT -- 12.7 --  INR -- 0.96 --  HEPARINUNFRC -- -- --  CREATININE 0.75 0.87 --  CKTOTAL -- -- --  CKMB -- -- --  TROPONINI -- -- --    Estimated Creatinine Clearance: 121.2 ml/min (by C-G formula based on Cr of 0.75).   Medical History: Past Medical History  Diagnosis Date  . GERD (gastroesophageal reflux disease)   . CAD (coronary artery disease) 01/22/12    anterolateral MI with DES to LAD EF 25%; reinfarction 02/05/12 while in [place], [place] with thrombosis of the stent. EF now down to 10%  . Hyperlipidemia   . Tobacco abuse   . Chronic systolic CHF (congestive heart failure)     EF from 25% to 10% following MI; reinfarction   . Noncompliance   . Syncope   . LV (left ventricular) mural thrombus     "just found it in Oct" (07/09/2012)  . Ischemic cardiomyopathy   . Anginal pain   . Myocardial infarction 01/2012; 02/2012; 04/2012    Medications:  Prescriptions prior to admission  Medication Sig Dispense Refill  . carvedilol (COREG) 6.25 MG tablet Take 1.5 tablets (9.375 mg total) by mouth 2 (two) times daily.  90 tablet  6  . digoxin (LANOXIN) 0.125 MG tablet Take 0.125 mg by mouth every morning.       Marland Kitchen lisinopril (PRINIVIL,ZESTRIL) 5 MG tablet Take 5 mg by mouth at bedtime.      . Ticagrelor (BRILINTA) 90 MG TABS tablet Take 90 mg by mouth 2 (two) times  daily.      Marland Kitchen warfarin (COUMADIN) 5 MG tablet Take 5 mg by mouth every evening.       . nitroGLYCERIN (NITROSTAT) 0.4 MG SL tablet Place 0.4 mg under the tongue every 5 (five) minutes as needed. For chest pain        Assessment: 37 yo male s/p cath with 3V CAD and noted with history of LV thrombus on coumadin PTA. Patient to begin heparin and also noted consideration for CABG. TR band removed at 5:15pm.  Goal of Therapy:  Heparin level 0.3-0.7 units/ml Monitor platelets by anticoagulation protocol: Yes   Plan:  -Start heparin at 950 units/hr 4 hours post TR band removal -Heparin level in 6 hours and daily wth CBC daily  Harland German, Pharm D 07/09/2012 8:34 PM
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
## 2012-07-09 NOTE — Progress Notes (Signed)
TR BAND REMOVAL  LOCATION:    right radial  DEFLATED PER PROTOCOL:    yes  TIME BAND OFF / DRESSING APPLIED:    1715   SITE UPON ARRIVAL:    Level 0  SITE AFTER BAND REMOVAL:    Level 0  REVERSE ALLEN'S TEST:     positive  CIRCULATION SENSATION AND MOVEMENT:    Within Normal Limits   yes  COMMENTS:   Dressing remains dry and intact with no change in assessment at 1745
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
## 2012-07-10 ENCOUNTER — Encounter (HOSPITAL_COMMUNITY): Payer: Medicaid Other

## 2012-07-10 ENCOUNTER — Inpatient Hospital Stay (HOSPITAL_COMMUNITY): Payer: Medicaid Other

## 2012-07-10 ENCOUNTER — Other Ambulatory Visit: Payer: Self-pay | Admitting: *Deleted

## 2012-07-10 DIAGNOSIS — I200 Unstable angina: Secondary | ICD-10-CM

## 2012-07-10 DIAGNOSIS — I2510 Atherosclerotic heart disease of native coronary artery without angina pectoris: Secondary | ICD-10-CM

## 2012-07-10 DIAGNOSIS — I5022 Chronic systolic (congestive) heart failure: Secondary | ICD-10-CM

## 2012-07-10 DIAGNOSIS — I428 Other cardiomyopathies: Secondary | ICD-10-CM

## 2012-07-10 DIAGNOSIS — I059 Rheumatic mitral valve disease, unspecified: Secondary | ICD-10-CM

## 2012-07-10 DIAGNOSIS — I509 Heart failure, unspecified: Secondary | ICD-10-CM

## 2012-07-10 LAB — BLOOD GAS, ARTERIAL
Acid-base deficit: 0.1 mmol/L (ref 0.0–2.0)
Bicarbonate: 23.4 mEq/L (ref 20.0–24.0)
Drawn by: 205171
FIO2: 0.21 %
O2 Saturation: 97.3 %
Patient temperature: 98.6
TCO2: 24.4 mmol/L (ref 0–100)
pCO2 arterial: 33.7 mmHg — ABNORMAL LOW (ref 35.0–45.0)
pH, Arterial: 7.456 — ABNORMAL HIGH (ref 7.350–7.450)
pO2, Arterial: 87.1 mmHg (ref 80.0–100.0)

## 2012-07-10 LAB — CBC
HCT: 41.1 % (ref 39.0–52.0)
Hemoglobin: 14.3 g/dL (ref 13.0–17.0)
MCH: 33.6 pg (ref 26.0–34.0)
MCHC: 34.8 g/dL (ref 30.0–36.0)
RBC: 4.25 MIL/uL (ref 4.22–5.81)

## 2012-07-10 LAB — HEPARIN LEVEL (UNFRACTIONATED)
Heparin Unfractionated: 0.22 IU/mL — ABNORMAL LOW (ref 0.30–0.70)
Heparin Unfractionated: 0.32 IU/mL (ref 0.30–0.70)

## 2012-07-10 MED ORDER — GADOBENATE DIMEGLUMINE 529 MG/ML IV SOLN
20.0000 mL | Freq: Once | INTRAVENOUS | Status: AC | PRN
  Administered 2012-07-10: 20 mL via INTRAVENOUS

## 2012-07-10 MED ORDER — BUDESONIDE-FORMOTEROL FUMARATE 160-4.5 MCG/ACT IN AERO
2.0000 | INHALATION_SPRAY | Freq: Two times a day (BID) | RESPIRATORY_TRACT | Status: DC
  Administered 2012-07-10 – 2012-07-21 (×15): 2 via RESPIRATORY_TRACT
  Filled 2012-07-10 (×2): qty 6

## 2012-07-10 MED ORDER — ATORVASTATIN CALCIUM 80 MG PO TABS
80.0000 mg | ORAL_TABLET | Freq: Every day | ORAL | Status: DC
  Administered 2012-07-10 – 2012-07-19 (×8): 80 mg via ORAL
  Filled 2012-07-10 (×11): qty 1

## 2012-07-10 MED ORDER — NICOTINE 21 MG/24HR TD PT24
21.0000 mg | MEDICATED_PATCH | Freq: Every day | TRANSDERMAL | Status: DC
  Administered 2012-07-10: 21 mg via TRANSDERMAL
  Filled 2012-07-10 (×3): qty 1

## 2012-07-10 MED ORDER — PERFLUTREN LIPID MICROSPHERE
1.0000 mL | INTRAVENOUS | Status: AC | PRN
  Administered 2012-07-10: 5 mL via INTRAVENOUS
  Filled 2012-07-10: qty 10

## 2012-07-10 NOTE — Progress Notes (Signed)
ANTICOAGULATION CONSULT NOTE - Follow Up Consult  Pharmacy Consult for heparin Indication: CAD and h/o LV thrombus  Labs:  Basename 07/10/12 0555 07/09/12 1547 07/09/12 1326 07/09/12 1038  HGB 14.3 14.8 -- --  HCT 41.1 41.2 -- 44.0  PLT 239 258 -- 338  APTT -- -- -- --  LABPROT -- -- 12.7 --  INR -- -- 0.96 --  HEPARINUNFRC 0.22* -- -- --  CREATININE -- 0.75 0.87 --  CKTOTAL -- -- -- --  CKMB -- -- -- --  TROPONINI -- -- -- --    Assessment: 37yo male subtherapeutic on heparin with initial dosing post cath for CAD with possible CVTS consult and h/o LV thrombus.  Goal of Therapy:  Heparin level 0.3-0.7 units/ml   Plan:  Will increase heparin gtt by ~2 units/kg/hr to 1100 units/hr and check level in 6hr.  Colleen Can PharmD BCPS 07/10/2012,7:32 AM

## 2012-07-10 NOTE — Progress Notes (Signed)
Pt swabbed for MRSA and Staph. Both PCR results were negative on 07/09/2012. Will not obtain Surgical PCR screen.

## 2012-07-10 NOTE — Progress Notes (Addendum)
Advanced Heart Failure Rounding Note   Subjective:    [name] is a 37 year old male with coronary artery disease 01/2012, ischemic cardiomyopathy- Hx of anterolateral MI with DES to LAD EF 25%; reinfarction 02/05/12 while in [place], [place] with thrombosis of the stent. EF now down to 15-20% smokes 1/2 pack per day, stopped drinking alcohol 01/2012 (drinking 12 pack beer per day). Father, brother, sister, and mother all have heart disease  November 2013 - CPX Peak VO2: 21.9 % predicted peak VO2: 52.9% (41% of predicted) VE/VCO2 slope: 33.7 OUES: 1.53 Peak RER: 1.31 Ventilatory Threshold: 17.4 % predicted peak VO2: 42%  Readmitted yesterday with unstable angina. Cath results reviewed personally and discussed with Dr. Excell Seltzer. 3v CAD with 80% restenosis at edge of LAD stent. Referred for TCTS consultation.  No CP over night. No orthopnea or PND. BP remains soft but asymptomatic.   Objective:   Weight Range:  Vital Signs:   Temp:  [97.6 F (36.4 C)-98.3 F (36.8 C)] 97.6 F (36.4 C) (01/07 0617) Pulse Rate:  [56-88] 56  (01/07 0617) Resp:  [16-32] 17  (01/07 0617) BP: (70-117)/(39-73) 87/56 mmHg (01/07 0621) SpO2:  [98 %-100 %] 98 % (01/07 0617) Weight:  [67.132 kg (148 lb)] 67.132 kg (148 lb) (01/07 0617) Last BM Date: 07/09/12  Weight change: Filed Weights   07/09/12 1054 07/10/12 0617  Weight: 67.132 kg (148 lb) 67.132 kg (148 lb)    Intake/Output:   Intake/Output Summary (Last 24 hours) at 07/10/12 0841 Last data filed at 07/10/12 0649  Gross per 24 hour  Intake     90 ml  Output    400 ml  Net   -310 ml     Physical Exam: General:  Well appearing. No resp difficulty HEENT: normal Neck: supple. JVP . Carotids 2+ bilat; no bruits. No lymphadenopathy or thryomegaly appreciated. Cor: PMI laterally displaced. Regular rate & rhythm. No rubs, gallops or murmurs. Lungs: clear Abdomen: soft, nontender, nondistended. No hepatosplenomegaly. No bruits or masses. Good  bowel sounds. Extremities: no cyanosis, clubbing, rash, edema Neuro: alert & orientedx3, cranial nerves grossly intact. moves all 4 extremities w/o difficulty. Affect pleasant  Telemetry: sinus rhythm  Labs: Basic Metabolic Panel:  Lab 07/09/12 1610 07/09/12 1326  NA -- 139  K -- 4.6  CL -- 108  CO2 -- 23  GLUCOSE -- 82  BUN -- 8  CREATININE 0.75 0.87  CALCIUM -- 9.2  MG -- --  PHOS -- --    Liver Function Tests: No results found for this basename: AST:5,ALT:5,ALKPHOS:5,BILITOT:5,PROT:5,ALBUMIN:5 in the last 168 hours No results found for this basename: LIPASE:5,AMYLASE:5 in the last 168 hours No results found for this basename: AMMONIA:3 in the last 168 hours  CBC:  Lab 07/10/12 0555 07/09/12 1547 07/09/12 1038  WBC 6.7 6.8 7.7  NEUTROABS -- -- --  HGB 14.3 14.8 16.0  HCT 41.1 41.2 44.0  MCV 96.7 95.2 95.4  PLT 239 258 338    Cardiac Enzymes: No results found for this basename: CKTOTAL:5,CKMB:5,CKMBINDEX:5,TROPONINI:5 in the last 168 hours  BNP: BNP (last 3 results) No results found for this basename: PROBNP:3 in the last 8760 hours    Imaging:  No results found.   Medications:     Scheduled Medications:    . aspirin  81 mg Oral Daily  . carvedilol  9.375 mg Oral BID WC  . digoxin  0.125 mg Oral q morning - 10a  . lisinopril  5 mg Oral QHS  Infusions:    . heparin 1,100 Units/hr (07/10/12 0757)     PRN Medications:  acetaminophen, nitroGLYCERIN, ondansetron (ZOFRAN) IV   Assessment:   1. Unstable angina 2. 3v CAD with recurrent stent restenosis at LAD site 3. 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 NSVT s/p Lifevest 5. H/o ETOH abuse quit 7/13 6. Tobacco abuse  Quit 07/04/12 7. H/o LV thrombus  Plan/Discussion:    Cath films reviewed. Agree with plan for CABG referral. Based on echo has had extensive previous MI. Recurrent angina clearly suggests viable myocardium in LAD territory and  thus I do think CABG is best option. That said, there remains a question as to the extent of improvement he might see from CABG. Will await surgical evaluation. May be worth considering a pre-op cardiac MRI.   From a HF persepective he is improved but still tenuous. Unable to titrate meds further at this point. pVO2 is > 48ml/kg/min however % predicted (42%) is quite low for a person his age and could meet transplant criteria at some point but his social situation precludes further evaluation at this time.  Medically he is stable enough for CABG.   Will continue heparin for LV thrombus. Add statin. Repeat echo to look for LV thrombus and EF recovery.   Length of Stay: 1   Darsi Tien 07/10/2012, 8:41 AM

## 2012-07-10 NOTE — Progress Notes (Signed)
ANTICOAGULATION CONSULT NOTE - Follow Up Consult  Pharmacy Consult for heparin Indication: CAD and h/o LV thrombus  Labs:  Basename 07/10/12 1554 07/10/12 0555 07/09/12 1547 07/09/12 1326 07/09/12 1038  HGB -- 14.3 14.8 -- --  HCT -- 41.1 41.2 -- 44.0  PLT -- 239 258 -- 338  APTT -- -- -- -- --  LABPROT -- -- -- 12.7 --  INR -- -- -- 0.96 --  HEPARINUNFRC 0.32 0.22* -- -- --  CREATININE -- -- 0.75 0.87 --  CKTOTAL -- -- -- -- --  CKMB -- -- -- -- --  TROPONINI -- -- -- -- --    Assessment: 37yo male on heparin infusion for hx of LV thrombus and CAD, plan for CABG on 1/13. Heparin level low-end therapeutic = 0.32 after rate increase this morning. CBC stable, no bleeding noted per chart..  Goal of Therapy:  Heparin level 0.3-0.7 units/ml   Plan:  Increase heparin rate slightly to 1150 units/hr to stay in therapeutic range F/u AM heparin level and cbc  Bayard Hugger, PharmD, BCPS  Clinical Pharmacist  Pager: [phone]  07/10/2012,4:49 PM

## 2012-07-10 NOTE — Progress Notes (Signed)
CARDIAC REHAB PHASE I   PRE:  Rate/Rhythm: 67 SR    BP: sitting 84/50    SaO2:   MODE:  Ambulation: 790 ft   POST:  Rate/Rhythm: 67 SR    BP: sitting 86/50     SaO2:   Tolerated well. No c/o. BP and HR did not change with walking (normal stroll pace). Will continue to follow. [id number]  Harriet Masson CES, ACSM
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
## 2012-07-11 ENCOUNTER — Inpatient Hospital Stay (HOSPITAL_COMMUNITY): Payer: Medicaid Other

## 2012-07-11 DIAGNOSIS — I2510 Atherosclerotic heart disease of native coronary artery without angina pectoris: Secondary | ICD-10-CM

## 2012-07-11 DIAGNOSIS — Z01810 Encounter for preprocedural cardiovascular examination: Secondary | ICD-10-CM

## 2012-07-11 LAB — URINALYSIS, ROUTINE W REFLEX MICROSCOPIC
Bilirubin Urine: NEGATIVE
Glucose, UA: NEGATIVE mg/dL
Ketones, ur: NEGATIVE mg/dL
Leukocytes, UA: NEGATIVE
Nitrite: NEGATIVE
Protein, ur: NEGATIVE mg/dL
Specific Gravity, Urine: 1.015 (ref 1.005–1.030)
Urobilinogen, UA: 2 mg/dL — ABNORMAL HIGH (ref 0.0–1.0)
pH: 7 (ref 5.0–8.0)

## 2012-07-11 LAB — CBC
HCT: 41.7 % (ref 39.0–52.0)
MCH: 33.6 pg (ref 26.0–34.0)
MCHC: 34.8 g/dL (ref 30.0–36.0)
MCV: 96.5 fL (ref 78.0–100.0)
Platelets: 251 10*3/uL (ref 150–400)
RDW: 13.4 % (ref 11.5–15.5)

## 2012-07-11 LAB — PLATELET INHIBITION P2Y12: Platelet Function  P2Y12: 231 [PRU] (ref 194–418)

## 2012-07-11 LAB — HEMOGLOBIN A1C
Hgb A1c MFr Bld: 5.2 % (ref <5.7)
Mean Plasma Glucose: 103 mg/dL (ref <117)

## 2012-07-11 LAB — TSH: TSH: 1.212 u[IU]/mL (ref 0.350–4.500)

## 2012-07-11 LAB — URINE MICROSCOPIC-ADD ON

## 2012-07-11 MED ORDER — ASPIRIN 81 MG PO CHEW
324.0000 mg | CHEWABLE_TABLET | Freq: Every day | ORAL | Status: DC
  Administered 2012-07-12 – 2012-07-13 (×2): 324 mg via ORAL
  Filled 2012-07-11 (×2): qty 4

## 2012-07-11 MED ORDER — SODIUM CHLORIDE 0.9 % IV SOLN: 250.0000 mL | INTRAVENOUS | Status: DC | PRN

## 2012-07-11 MED ORDER — SODIUM CHLORIDE 0.9 % IJ SOLN: 3.0000 mL | INTRAMUSCULAR | Status: DC | PRN

## 2012-07-11 MED ORDER — SODIUM CHLORIDE 0.9 % IJ SOLN
3.0000 mL | Freq: Two times a day (BID) | INTRAMUSCULAR | Status: DC
  Administered 2012-07-11: 3 mL via INTRAVENOUS

## 2012-07-11 MED ORDER — ALBUTEROL SULFATE (5 MG/ML) 0.5% IN NEBU
2.5000 mg | INHALATION_SOLUTION | Freq: Once | RESPIRATORY_TRACT | Status: AC
  Administered 2012-07-11: 2.5 mg via RESPIRATORY_TRACT

## 2012-07-11 NOTE — Progress Notes (Signed)
ANTICOAGULATION CONSULT NOTE - Follow Up Consult  Pharmacy Consult for heparin Indication: CAD and h/o LV thrombus  Allergies  Allergen Reactions  . Adhesive (Tape) Rash    "paper tape is ok" (07/09/2012)  . Bee Venom Anaphylaxis  . Penicillins Rash    Patient Measurements: Height: 5\' 8"  (172.7 cm) Weight: 148 lb (67.132 kg) IBW/kg (Calculated) : 68.4  Heparin Dosing Weight: 67 kg  Vital Signs: Temp: 97.4 F (36.3 C) (01/08 0446) Temp src: Oral (01/08 0446) BP: 91/61 mmHg (01/08 0446) Pulse Rate: 58  (01/08 0446)  Labs:  Basename 07/11/12 0520 07/10/12 1554 07/10/12 0555 07/09/12 1547 07/09/12 1326  HGB 14.5 -- 14.3 -- --  HCT 41.7 -- 41.1 41.2 --  PLT 251 -- 239 258 --  APTT -- -- -- -- --  LABPROT -- -- -- -- 12.7  INR -- -- -- -- 0.96  HEPARINUNFRC 0.40 0.32 0.22* -- --  CREATININE -- -- -- 0.75 0.87  CKTOTAL -- -- -- -- --  CKMB -- -- -- -- --  TROPONINI -- -- -- -- --    Estimated Creatinine Clearance: 121.2 ml/min (by C-G formula based on Cr of 0.75).  Assessment: Patient is 37 y.o. M on heparin for CAD and hx LV thrombus with plan for CABG on Monday.  Heparin level is at goal today.  No bleeding noted.  Goal of Therapy:  Heparin level 0.3-0.7 units/ml Monitor platelets by anticoagulation protocol: Yes   Plan:  1) continue current heparin regimen  Fatma Rutten P 07/11/2012,7:58 AM

## 2012-07-11 NOTE — Progress Notes (Signed)
Pre-op Cardiac Surgery  Carotid Findings:  There is no obvious evidence of hemodynamically significant internal carotid artery stenosis >40% bilaterally. Vertebral arteries are patent with antegrade flow.  Limbs not completed yet.   Upper Extremity Right Left  Brachial Pressures    Radial Waveforms    Ulnar Waveforms    Palmar Arch (Allen's Test)     Findings:      Lower  Extremity Right Left  Dorsalis Pedis    Anterior Tibial    Posterior Tibial    Ankle/Brachial Indices      Findings:     07/11/2012 11:10 AM Gertie Fey, RDMS, RDCS

## 2012-07-11 NOTE — Progress Notes (Signed)
CARDIAC REHAB PHASE I   PRE:  Rate/Rhythm: 63 SR  BP:  Supine: 96/60  Sitting:   Standing:    SaO2: 97 RA  MODE:  Ambulation: 890 ft   POST:  Rate/Rhythem: 67  BP:  Supine:   Sitting: 92/60  Standing:    SaO2: 100 RA 1450-1515 Tolerated ambulation well without c/o of cp or SOB. VS stable. Encouraged pt to watch Preparing for OHS and Defibraltor videos.  Beatrix Fetters

## 2012-07-11 NOTE — Progress Notes (Signed)
2 Days Post-Op Procedure(s) (LRB): LEFT HEART CATHETERIZATION WITH CORONARY ANGIOGRAM (N/A) Subjective: Ischemic cm getting worse, in stent stenosis Objective: Vital signs in last 24 hours: Temp:  [97.4 F (36.3 C)-98.1 F (36.7 C)] 97.4 F (36.3 C) (01/08 0446) Pulse Rate:  [58-68] 58  (01/08 0446) Cardiac Rhythm:  [-] Normal sinus rhythm;Sinus bradycardia (01/08 0834) Resp:  [18-20] 18  (01/08 0446) BP: (88-103)/(49-64) 91/61 mmHg (01/08 0446) SpO2:  [98 %] 98 % (01/08 0849)  Hemodynamic parameters for last 24 hours:  nsr Intake/Output from previous day: 01/07 0701 - 01/08 0700 In: 1320 [P.O.:1320] Out: -  Intake/Output this shift:      Lab Results:  Basename 07/11/12 0520 07/10/12 0555  WBC 7.2 6.7  HGB 14.5 14.3  HCT 41.7 41.1  PLT 251 239   BMET:  Basename 07/09/12 1547 07/09/12 1326  NA -- 139  K -- 4.6  CL -- 108  CO2 -- 23  GLUCOSE -- 82  BUN -- 8  CREATININE 0.75 0.87  CALCIUM -- 9.2    PT/INR:  Basename 07/09/12 1326  LABPROT 12.7  INR 0.96   ABG    Component Value Date/Time   PHART 7.456* 07/10/2012 1015   HCO3 23.4 07/10/2012 1015   TCO2 24.4 07/10/2012 1015   ACIDBASEDEF 0.1 07/10/2012 1015   O2SAT 97.3 07/10/2012 1015   CBG (last 3)  No results found for this basename: GLUCAP:3 in the last 72 hours  Assessment/Plan: S/P Procedure(s) (LRB): LEFT HEART CATHETERIZATION WITH CORONARY ANGIOGRAM (N/A) Cardiac MRI results noted Patient needs RHC preop CABG P2Y12 is OK for Mon OR   LOS: 2 days    VAN TRIGT III,Cinsere Mizrahi 07/11/2012

## 2012-07-11 NOTE — Progress Notes (Signed)
Pre-op Cardiac Surgery  Carotid Findings:  There is no obvious evidence of hemodynamically significant internal carotid artery stenosis >40% bilaterally. Vertebral arteries are patent with antegrade flow.  Limbs not completed yet.   Upper Extremity Right Left  Brachial Pressures 97 Triphasic 89 Tiphasic  Radial Waveforms Triphasic Triphasic  Ulnar Waveforms Triphasic Triphasic  Palmar Arch (Allen's Test) Normal Normal   Findings:  Doppler waveforms remained normal bilaterally with both radial and ulnar compressions    Lower  Extremity Right Left  Dorsalis Pedis    Anterior Tibial    Posterior Tibial    Ankle/Brachial Indices      Findings:  Palpable pedal pulses bilaterally at rest.   07/11/2012 11:09 A.M. Gertie Fey, RDMS, RDCS  [place] , RVS 07/11/2012 11:39 AM

## 2012-07-11 NOTE — Progress Notes (Signed)
Advanced Heart Failure Rounding Note   Subjective:    Mr. Obarr is a 37-year-old male with coronary artery disease 01/2012, ischemic cardiomyopathy- Hx of anterolateral MI with DES to LAD EF 25%; reinfarction 02/05/12 while in [place], [place] with thrombosis of the stent. EF now down to 15-20% smokes 1/2 pack per day, stopped drinking alcohol 01/2012 (drinking 12 pack beer per day). Father, brother, sister, and mother all have heart disease  November 2013 - CPX Peak VO2: 21.9 % predicted peak VO2: 52.9% (41% of predicted) VE/VCO2 slope: 33.7 OUES: 1.53 Peak RER: 1.31 Ventilatory Threshold: 17.4 % predicted peak VO2: 42%  Readmitted with unstable angina. Cath results reviewed personally and discussed with Dr. Cooper. 3v CAD with 80% restenosis at edge of LAD stent. Referred for TCTS consultation.  MRI reviewed: EF 18% Large scar. + LV clot  Ambulating hal. No CP, orthopnea or PND. On heparin   Objective:   Weight Range:  Vital Signs:   Temp:  [97.4 F (36.3 C)-98.1 F (36.7 C)] 97.5 F (36.4 C) (01/08 1357) Pulse Rate:  [58-68] 65  (01/08 1357) Resp:  [18-20] 18  (01/08 1357) BP: (88-103)/(49-64) 98/58 mmHg (01/08 1357) SpO2:  [98 %-100 %] 100 % (01/08 1357) Last BM Date: 07/10/12  Weight change: Filed Weights   07/09/12 1054 07/10/12 0617  Weight: 67.132 kg (148 lb) 67.132 kg (148 lb)    Intake/Output:   Intake/Output Summary (Last 24 hours) at 07/11/12 1550 Last data filed at 07/11/12 1100  Gross per 24 hour  Intake    960 ml  Output    300 ml  Net    660 ml     Physical Exam: General:  Well appearing. No resp difficulty HEENT: normal Neck: supple. JVP flat . Carotids 2+ bilat; no bruits. No lymphadenopathy or thryomegaly appreciated. Cor: PMI laterally displaced. Regular rate & rhythm. No rubs, gallops or murmurs. Lungs: clear Abdomen: soft, nontender, nondistended. No hepatosplenomegaly. No bruits or masses. Good bowel sounds. Extremities: no cyanosis,  clubbing, rash, edema Neuro: alert & orientedx3, cranial nerves grossly intact. moves all 4 extremities w/o difficulty. Affect pleasant  Telemetry: sinus rhythm  Labs: Basic Metabolic Panel:  Lab 07/09/12 1547 07/09/12 1326  NA -- 139  K -- 4.6  CL -- 108  CO2 -- 23  GLUCOSE -- 82  BUN -- 8  CREATININE 0.75 0.87  CALCIUM -- 9.2  MG -- --  PHOS -- --    Liver Function Tests: No results found for this basename: AST:5,ALT:5,ALKPHOS:5,BILITOT:5,PROT:5,ALBUMIN:5 in the last 168 hours No results found for this basename: LIPASE:5,AMYLASE:5 in the last 168 hours No results found for this basename: AMMONIA:3 in the last 168 hours  CBC:  Lab 07/11/12 0520 07/10/12 0555 07/09/12 1547 07/09/12 1038  WBC 7.2 6.7 6.8 7.7  NEUTROABS -- -- -- --  HGB 14.5 14.3 14.8 16.0  HCT 41.7 41.1 41.2 44.0  MCV 96.5 96.7 95.2 95.4  PLT 251 239 258 338    Cardiac Enzymes: No results found for this basename: CKTOTAL:5,CKMB:5,CKMBINDEX:5,TROPONINI:5 in the last 168 hours  BNP: BNP (last 3 results) No results found for this basename: PROBNP:3 in the last 8760 hours    Imaging: Dg Chest 2 View  07/11/2012  *RADIOLOGY REPORT*  Clinical Data: Chest pain.  Coronary artery disease.  CHEST - 2 VIEW  Comparison: Single view of the chest 04/22/2012.  Findings: Lungs clear.  Mild cardiomegaly.  No pneumothorax or pleural effusion.  IMPRESSION: Mild cardiomegaly without acute disease.     Original Report Authenticated By: Thomas D'Alessio, M.D.    Mr Card Morphology Wo/w Cm  07/10/2012  Cardiac MRI:  Indication: Viability  The patient was scanned on a 1.5 Tesla GE magnet.  Functional imaging was done using Fiesta sequences.  2,3 and 4 chamber views were done to assess for RWMA;s. Quantitative EF was calculated using Simpsons method from a short axis stack on a dedicated work station.  The patient received 20 cc of Multihance.  After 10 minutes inversion recovery sequences were done to assess for viability. Long  TI images were done to assess for thrombus  Findings:  There was severe LVE.  The mid and distal anterior wall, septum, apex and infero apex were akinetic.  The quantitative EF was 18% ( EDV 214cc, ESV 175cc and SV 39cc)  There was a moderate sized mural apical thrombus.  There was mild LAE with some MR.  RA and RV were normal.  There was no pericardial effusion  There was full thickness scar involving the mid and distal anterior wall, mid and distal septum apex and inferoapex.  There was subendocardial scar involving the mid and basal inferior wall.  Impression:     1)    Severe LVE EF 18% see RWMA;s above        2)    Full thickness scar involving the mid and distal anterior wall and septum, apex and inferoapex.  Subendocardial scar in the mid and basal inferior wall 3)    Moderate sized mural apical thrombus 4)    Mild LAE 5)    MR 6)    Normal RA and RV 7)    No pericardial effusion Peter Nishan MD [facility]   Original Report Authenticated By: Peter Nishan, M.D.      Medications:     Scheduled Medications:    . aspirin  81 mg Oral Daily  . atorvastatin  80 mg Oral q1800  . budesonide-formoterol  2 puff Inhalation BID  . carvedilol  9.375 mg Oral BID WC  . digoxin  0.125 mg Oral q morning - 10a  . lisinopril  5 mg Oral QHS  . nicotine  21 mg Transdermal Daily    Infusions:    . heparin 1,150 Units/hr (07/10/12 1933)    PRN Medications: acetaminophen, nitroGLYCERIN, ondansetron (ZOFRAN) IV   Assessment:   1. Unstable angina 2. 3v CAD with recurrent stent restenosis at LAD site 3. 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 NSVT s/p Lifevest 5. H/o ETOH abuse quit 7/13 6. Tobacco abuse  Quit 07/04/12 7. H/o LV thrombus  Plan/Discussion:    Clinically stable. MRI shows large scar with EF 18%.  I still feel strongly that CABG followed by aggressive medical therapy is best option for him but explained that he will likely need advanced therapies  down the road. Will perform RHC on Monday.  BP to soft to titrate meds. I discussed anti-coagulation with Dr. Cooper. Vis-a-vis hepsrin vs integrillin.  He is off Brilinta, His stent is nearly 7 months old. I think risk of acute stent thrombosis is likely low. Will stay with heparin and ASA. Increase ASA to 325.   Length of Stay: 2   Shawnta Zimbelman 07/11/2012, 3:50 PM    

## 2012-07-11 NOTE — Progress Notes (Signed)
PFT completed at bedside. Unconfirmed results placed in Progress Notes of [facility].

## 2012-07-12 ENCOUNTER — Encounter (HOSPITAL_COMMUNITY)
Admission: RE | Disposition: A | Discharge status: Home / Self Care | Payer: Self-pay | Source: Ambulatory Visit | Attending: Internal Medicine

## 2012-07-12 DIAGNOSIS — I509 Heart failure, unspecified: Secondary | ICD-10-CM

## 2012-07-12 HISTORY — PX: RIGHT HEART CATHETERIZATION: SHX5447

## 2012-07-12 LAB — POCT I-STAT 3, VENOUS BLOOD GAS (G3P V)
Acid-base deficit: 3 mmol/L — ABNORMAL HIGH (ref 0.0–2.0)
Bicarbonate: 23.2 mEq/L (ref 20.0–24.0)
O2 Saturation: 65 %
TCO2: 22 mmol/L (ref 0–100)
TCO2: 25 mmol/L (ref 0–100)
pCO2, Ven: 39.7 mmHg — ABNORMAL LOW (ref 45.0–50.0)
pH, Ven: 7.33 — ABNORMAL HIGH (ref 7.250–7.300)
pO2, Ven: 36 mmHg (ref 30.0–45.0)

## 2012-07-12 LAB — ABO/RH: ABO/RH(D): O POS

## 2012-07-12 LAB — POCT I-STAT 3, ART BLOOD GAS (G3+)
Bicarbonate: 24.1 mEq/L — ABNORMAL HIGH (ref 20.0–24.0)
O2 Saturation: 69 %
TCO2: 25 mmol/L (ref 0–100)
pCO2 arterial: 40.7 mmHg (ref 35.0–45.0)
pO2, Arterial: 37 mmHg — CL (ref 80.0–100.0)

## 2012-07-12 LAB — BASIC METABOLIC PANEL
BUN: 8 mg/dL (ref 6–23)
CO2: 26 mEq/L (ref 19–32)
Chloride: 105 mEq/L (ref 96–112)
Glucose, Bld: 87 mg/dL (ref 70–99)
Potassium: 4.2 mEq/L (ref 3.5–5.1)
Sodium: 138 mEq/L (ref 135–145)

## 2012-07-12 LAB — CBC
HCT: 41 % (ref 39.0–52.0)
MCH: 34.2 pg — ABNORMAL HIGH (ref 26.0–34.0)
MCV: 97.4 fL (ref 78.0–100.0)
RBC: 4.21 MIL/uL — ABNORMAL LOW (ref 4.22–5.81)
WBC: 7.6 10*3/uL (ref 4.0–10.5)

## 2012-07-12 LAB — PREPARE RBC (CROSSMATCH)

## 2012-07-12 SURGERY — RIGHT HEART CATH
Anesthesia: LOCAL

## 2012-07-12 MED ORDER — LORAZEPAM 0.5 MG PO TABS: 0.5000 mg | ORAL_TABLET | ORAL | Status: DC | PRN

## 2012-07-12 MED ORDER — ONDANSETRON HCL 4 MG/2ML IJ SOLN: 4.0000 mg | Freq: Four times a day (QID) | INTRAMUSCULAR | Status: DC | PRN

## 2012-07-12 MED ORDER — METOPROLOL TARTRATE 12.5 MG HALF TABLET
12.5000 mg | ORAL_TABLET | Freq: Once | ORAL | Status: DC
  Filled 2012-07-12: qty 1

## 2012-07-12 MED ORDER — MIDAZOLAM HCL 2 MG/2ML IJ SOLN
INTRAMUSCULAR | Status: AC
  Filled 2012-07-12: qty 2

## 2012-07-12 MED ORDER — SODIUM CHLORIDE 0.9 % IJ SOLN
3.0000 mL | Freq: Two times a day (BID) | INTRAMUSCULAR | Status: DC
  Administered 2012-07-12 – 2012-07-13 (×3): 3 mL via INTRAVENOUS

## 2012-07-12 MED ORDER — BISACODYL 5 MG PO TBEC: 5.0000 mg | DELAYED_RELEASE_TABLET | Freq: Once | ORAL | Status: DC

## 2012-07-12 MED ORDER — LIDOCAINE HCL (PF) 1 % IJ SOLN
INTRAMUSCULAR | Status: AC
  Filled 2012-07-12: qty 30

## 2012-07-12 MED ORDER — SODIUM CHLORIDE 0.9 % IV SOLN: 250.0000 mL | INTRAVENOUS | Status: DC | PRN

## 2012-07-12 MED ORDER — FENTANYL CITRATE 0.05 MG/ML IJ SOLN
INTRAMUSCULAR | Status: AC
  Filled 2012-07-12: qty 2

## 2012-07-12 MED ORDER — TEMAZEPAM 15 MG PO CAPS: 15.0000 mg | ORAL_CAPSULE | Freq: Once | ORAL | Status: AC | PRN

## 2012-07-12 MED ORDER — HEPARIN (PORCINE) IN NACL 2-0.9 UNIT/ML-% IJ SOLN
INTRAMUSCULAR | Status: AC
  Filled 2012-07-12: qty 500

## 2012-07-12 MED ORDER — CHLORHEXIDINE GLUCONATE 4 % EX LIQD
60.0000 mL | Freq: Once | CUTANEOUS | Status: DC
  Filled 2012-07-12 (×4): qty 60

## 2012-07-12 MED ORDER — SODIUM CHLORIDE 0.9 % IJ SOLN: 3.0000 mL | INTRAMUSCULAR | Status: DC | PRN

## 2012-07-12 MED ORDER — ACETAMINOPHEN 325 MG PO TABS: 650.0000 mg | ORAL_TABLET | ORAL | Status: DC | PRN

## 2012-07-12 MED ORDER — HEPARIN (PORCINE) IN NACL 100-0.45 UNIT/ML-% IJ SOLN
1500.0000 [IU]/h | INTRAMUSCULAR | Status: DC
  Administered 2012-07-13: 1500 [IU]/h via INTRAVENOUS
  Administered 2012-07-13: 1150 [IU]/h via INTRAVENOUS
  Administered 2012-07-13 – 2012-07-15 (×4): 1500 [IU]/h via INTRAVENOUS
  Filled 2012-07-12 (×8): qty 250

## 2012-07-12 NOTE — Progress Notes (Signed)
ANTICOAGULATION CONSULT NOTE - Follow Up Consult  Pharmacy Consult for Heparin Indication: CAD and h/o LV thrombus  Allergies  Allergen Reactions  . Adhesive (Tape) Rash    "paper tape is ok" (07/09/2012)  . Bee Venom Anaphylaxis  . Penicillins Rash   Patient Measurements: Height: 5\' 8"  (172.7 cm) Weight: 148 lb (67.132 kg) IBW/kg (Calculated) : 68.4  Heparin dosing weight: 67kg  Vital Signs: Temp: 98.7 F (37.1 C) (01/09 1300) Temp src: Oral (01/09 1300) BP: 86/54 mmHg (01/09 1300) Pulse Rate: 53  (01/09 1414)  Labs:  Basename 07/12/12 0510 07/11/12 0520 07/10/12 1554 07/10/12 0555  HGB 14.4 14.5 -- --  HCT 41.0 41.7 -- 41.1  PLT 246 251 -- 239  APTT -- -- -- --  LABPROT 13.5 -- -- --  INR 1.04 -- -- --  HEPARINUNFRC 0.35 0.40 0.32 --  CREATININE 0.78 -- -- --  CKTOTAL -- -- -- --  CKMB -- -- -- --  TROPONINI -- -- -- --    Estimated Creatinine Clearance: 121.2 ml/min (by C-G formula based on Cr of 0.78).  Assessment: 36yom previously on heparin awaiting CABG on Monday is now s/p right heart cath for HF and heparin to resume 8 hours post-sheath removal. Sheath removed at 1511 and no bleeding or hematoma noted in the right groin. He has been therapeutic on 1150 units/hr so will resume at this rate.  Goal of Therapy:  Heparin level 0.3-0.7 units/ml Monitor platelets by anticoagulation protocol: Yes   Plan:  1) At 2315 tonight, begin heparin drip at 1150 units/hr with no bolus 2) Follow up daily heparin level (which will be ~6 hour level)  Fredrik Rigger 07/12/2012,3:58 PM

## 2012-07-12 NOTE — CV Procedure (Signed)
Cardiac Cath Procedure Note:  Indication:  HF; pre-CABG  Procedures performed:  1) Right heart catheterization  Description of procedure:   The risks and indication of the procedure were explained. Consent was signed and placed on the chart. An appropriate timeout was taken prior to the procedure. The right groin was prepped and draped in the routine sterile fashion and anesthetized with 1% local lidocaine.   A 7 FR venous sheath was placed in the right femoral vein using a modified Seldinger technique. A standard Swan-Ganz catheter was used for the procedure.   Complications: None apparent.  Findings:  RA = 4 RV = 35/2/6 PA = 30/14 (20) PCW = 15 v = 25 Fick cardiac output/index = 3.7/2.1 PVR = 1.4 Woods FA sat = 97% PA sat = 62%, 65%  Assessment: 1. Severe ICM with well compansated hemodynamics 2. Prominent v-waves c/w mitral regurgitation  Plan/Discussion:  Continue current regimen. Proceed with CABG Monday.  Daniel Bensimhon 2:49 PM

## 2012-07-12 NOTE — Progress Notes (Signed)
CARDIAC REHAB PHASE I   PRE:  Rate/Rhythm: 55 SB    BP: sitting 94/60    SaO2:   MODE:  Ambulation: 1440 ft   POST:  Rate/Rhythm: 69 SR    BP: sitting 90/60     SaO2:   Tolerated well. No c/o. Doesn't have CP walking. Discussed mobility and IS use. Discussed mobility postop as well. Voiced understanding. Sts he walks at night. 1250 on IS. Will f/u. [id number]  Harriet Masson CES, ACSM

## 2012-07-12 NOTE — Interval H&P Note (Signed)
History and Physical Interval Note:  07/12/2012 2:35 PM  Logan [name]  has presented today for surgery, with the diagnosis of hf  The various methods of treatment have been discussed with the patient and family. After consideration of risks, benefits and other options for treatment, the patient has consented to  Procedure(s) (LRB) with comments: RIGHT HEART CATH (N/A) as a surgical intervention .  The patient's history has been reviewed, patient examined, no change in status, stable for surgery.  I have reviewed the patient's chart and labs.  Questions were answered to the patient's satisfaction.     Rahm Minix

## 2012-07-12 NOTE — H&P (View-Only) (Signed)
Advanced Heart Failure Rounding Note   Subjective:    Mr. Vanhoesen is a 37 year old male with coronary artery disease 01/2012, ischemic cardiomyopathy- Hx of anterolateral MI with DES to LAD EF 25%; reinfarction 02/05/12 while in [place], [place] with thrombosis of the stent. EF now down to 15-20% smokes 1/2 pack per day, stopped drinking alcohol 01/2012 (drinking 12 pack beer per day). Father, brother, sister, and mother all have heart disease  November 2013 - CPX Peak VO2: 21.9 % predicted peak VO2: 52.9% (41% of predicted) VE/VCO2 slope: 33.7 OUES: 1.53 Peak RER: 1.31 Ventilatory Threshold: 17.4 % predicted peak VO2: 42%  Readmitted with unstable angina. Cath results reviewed personally and discussed with Dr. Excell Seltzer. 3v CAD with 80% restenosis at edge of LAD stent. Referred for TCTS consultation.  MRI reviewed: EF 18% Large scar. + LV clot  Ambulating hal. No CP, orthopnea or PND. On heparin   Objective:   Weight Range:  Vital Signs:   Temp:  [97.4 F (36.3 C)-98.1 F (36.7 C)] 97.5 F (36.4 C) (01/08 1357) Pulse Rate:  [58-68] 65  (01/08 1357) Resp:  [18-20] 18  (01/08 1357) BP: (88-103)/(49-64) 98/58 mmHg (01/08 1357) SpO2:  [98 %-100 %] 100 % (01/08 1357) Last BM Date: 07/10/12  Weight change: Filed Weights   07/09/12 1054 07/10/12 0617  Weight: 67.132 kg (148 lb) 67.132 kg (148 lb)    Intake/Output:   Intake/Output Summary (Last 24 hours) at 07/11/12 1550 Last data filed at 07/11/12 1100  Gross per 24 hour  Intake    960 ml  Output    300 ml  Net    660 ml     Physical Exam: General:  Well appearing. No resp difficulty HEENT: normal Neck: supple. JVP flat . Carotids 2+ bilat; no bruits. No lymphadenopathy or thryomegaly appreciated. Cor: PMI laterally displaced. Regular rate & rhythm. No rubs, gallops or murmurs. Lungs: clear Abdomen: soft, nontender, nondistended. No hepatosplenomegaly. No bruits or masses. Good bowel sounds. Extremities: no cyanosis,  clubbing, rash, edema Neuro: alert & orientedx3, cranial nerves grossly intact. moves all 4 extremities w/o difficulty. Affect pleasant  Telemetry: sinus rhythm  Labs: Basic Metabolic Panel:  Lab 07/09/12 4098 07/09/12 1326  NA -- 139  K -- 4.6  CL -- 108  CO2 -- 23  GLUCOSE -- 82  BUN -- 8  CREATININE 0.75 0.87  CALCIUM -- 9.2  MG -- --  PHOS -- --    Liver Function Tests: No results found for this basename: AST:5,ALT:5,ALKPHOS:5,BILITOT:5,PROT:5,ALBUMIN:5 in the last 168 hours No results found for this basename: LIPASE:5,AMYLASE:5 in the last 168 hours No results found for this basename: AMMONIA:3 in the last 168 hours  CBC:  Lab 07/11/12 0520 07/10/12 0555 07/09/12 1547 07/09/12 1038  WBC 7.2 6.7 6.8 7.7  NEUTROABS -- -- -- --  HGB 14.5 14.3 14.8 16.0  HCT 41.7 41.1 41.2 44.0  MCV 96.5 96.7 95.2 95.4  PLT 251 239 258 338    Cardiac Enzymes: No results found for this basename: CKTOTAL:5,CKMB:5,CKMBINDEX:5,TROPONINI:5 in the last 168 hours  BNP: BNP (last 3 results) No results found for this basename: PROBNP:3 in the last 8760 hours    Imaging: Dg Chest 2 View  07/11/2012  *RADIOLOGY REPORT*  Clinical Data: Chest pain.  Coronary artery disease.  CHEST - 2 VIEW  Comparison: Single view of the chest 04/22/2012.  Findings: Lungs clear.  Mild cardiomegaly.  No pneumothorax or pleural effusion.  IMPRESSION: Mild cardiomegaly without acute disease.  Original Report Authenticated By: Holley Dexter, M.D.    Mr Card Morphology Wo/w Cm  07/10/2012  Cardiac MRI:  Indication: Viability  The patient was scanned on a 1.5 Tesla GE magnet.  Functional imaging was done using Fiesta sequences.  2,3 and 4 chamber views were done to assess for RWMA;s. Quantitative EF was calculated using Simpsons method from a short axis stack on a dedicated work station.  The patient received 20 cc of Multihance.  After 10 minutes inversion recovery sequences were done to assess for viability. Long  TI images were done to assess for thrombus  Findings:  There was severe LVE.  The mid and distal anterior wall, septum, apex and infero apex were akinetic.  The quantitative EF was 18% ( EDV 214cc, ESV 175cc and SV 39cc)  There was a moderate sized mural apical thrombus.  There was mild LAE with some MR.  RA and RV were normal.  There was no pericardial effusion  There was full thickness scar involving the mid and distal anterior wall, mid and distal septum apex and inferoapex.  There was subendocardial scar involving the mid and basal inferior wall.  Impression:     1)    Severe LVE EF 18% see RWMA;s above        2)    Full thickness scar involving the mid and distal anterior wall and septum, apex and inferoapex.  Subendocardial scar in the mid and basal inferior wall 3)    Moderate sized mural apical thrombus 4)    Mild LAE 5)    MR 6)    Normal RA and RV 7)    No pericardial effusion Charlton Haws MD [facility]   Original Report Authenticated By: Charlton Haws, M.D.      Medications:     Scheduled Medications:    . aspirin  81 mg Oral Daily  . atorvastatin  80 mg Oral q1800  . budesonide-formoterol  2 puff Inhalation BID  . carvedilol  9.375 mg Oral BID WC  . digoxin  0.125 mg Oral q morning - 10a  . lisinopril  5 mg Oral QHS  . nicotine  21 mg Transdermal Daily    Infusions:    . heparin 1,150 Units/hr (07/10/12 1933)    PRN Medications: acetaminophen, nitroGLYCERIN, ondansetron (ZOFRAN) IV   Assessment:   1. Unstable angina 2. 3v CAD with recurrent stent restenosis at LAD site 3. 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 NSVT s/p Lifevest 5. H/o ETOH abuse quit 7/13 6. Tobacco abuse  Quit 07/04/12 7. H/o LV thrombus  Plan/Discussion:    Clinically stable. MRI shows large scar with EF 18%.  I still feel strongly that CABG followed by aggressive medical therapy is best option for him but explained that he will likely need advanced therapies  down the road. Will perform RHC on Monday.  BP to soft to titrate meds. I discussed anti-coagulation with Dr. Excell Seltzer. Vis-a-vis hepsrin vs integrillin.  He is off Brilinta, His stent is nearly 53 months old. I think risk of acute stent thrombosis is likely low. Will stay with heparin and ASA. Increase ASA to 325.   Length of Stay: 2   Arvilla Meres 07/11/2012, 3:50 PM

## 2012-07-12 NOTE — Progress Notes (Signed)
ANTICOAGULATION CONSULT NOTE - Follow Up Consult  Pharmacy Consult for heparin Indication: CAD and h/o LV thrombus  Allergies  Allergen Reactions  . Adhesive (Tape) Rash    "paper tape is ok" (07/09/2012)  . Bee Venom Anaphylaxis  . Penicillins Rash    Patient Measurements: Height: 5\' 8"  (172.7 cm) Weight: 148 lb (67.132 kg) IBW/kg (Calculated) : 68.4  Heparin Dosing Weight: 67 kg  Vital Signs: Temp: 97.5 F (36.4 C) (01/09 0459) Temp src: Oral (01/09 0459) BP: 90/64 mmHg (01/09 0459) Pulse Rate: 54  (01/09 0459)  Labs:  Basename 07/12/12 0510 07/11/12 0520 07/10/12 1554 07/10/12 0555 07/09/12 1547 07/09/12 1326  HGB 14.4 14.5 -- -- -- --  HCT 41.0 41.7 -- 41.1 -- --  PLT 246 251 -- 239 -- --  APTT -- -- -- -- -- --  LABPROT 13.5 -- -- -- -- 12.7  INR 1.04 -- -- -- -- 0.96  HEPARINUNFRC 0.35 0.40 0.32 -- -- --  CREATININE 0.78 -- -- -- 0.75 0.87  CKTOTAL -- -- -- -- -- --  CKMB -- -- -- -- -- --  TROPONINI -- -- -- -- -- --    Estimated Creatinine Clearance: 121.2 ml/min (by C-G formula based on Cr of 0.78).  Assessment: 37 y.o. male on heparin for CAD and hx LV thrombus with plan for CABG on Monday.  Heparin level remains therapeutic today. CBC stable. No bleeding noted in chart.  Goal of Therapy:  Heparin level 0.3-0.7 units/ml Monitor platelets by anticoagulation protocol: Yes   Plan:  1) Continue IV heparin at current rate 1150 units/hr 2) Continue to monitor daily heparin level, CBC, signs/symptoms bleeding    Benjaman Pott, PharmD    07/12/2012   8:21 AM

## 2012-07-13 DIAGNOSIS — I2510 Atherosclerotic heart disease of native coronary artery without angina pectoris: Secondary | ICD-10-CM

## 2012-07-13 DIAGNOSIS — I219 Acute myocardial infarction, unspecified: Secondary | ICD-10-CM

## 2012-07-13 LAB — CBC
HCT: 43.1 % (ref 39.0–52.0)
MCV: 97.1 fL (ref 78.0–100.0)
Platelets: 244 10*3/uL (ref 150–400)
RBC: 4.44 MIL/uL (ref 4.22–5.81)
WBC: 10.6 10*3/uL — ABNORMAL HIGH (ref 4.0–10.5)

## 2012-07-13 LAB — TYPE AND SCREEN
ABO/RH(D): O POS
Antibody Screen: NEGATIVE

## 2012-07-13 LAB — BASIC METABOLIC PANEL
CO2: 25 mEq/L (ref 19–32)
Calcium: 9.6 mg/dL (ref 8.4–10.5)
Chloride: 94 mEq/L — ABNORMAL LOW (ref 96–112)
Potassium: 4.7 mEq/L (ref 3.5–5.1)
Sodium: 146 mEq/L — ABNORMAL HIGH (ref 135–145)

## 2012-07-13 MED ORDER — ASPIRIN 81 MG PO CHEW: 81.0000 mg | CHEWABLE_TABLET | Freq: Every day | ORAL | Status: DC

## 2012-07-13 MED ORDER — ASPIRIN 81 MG PO CHEW
324.0000 mg | CHEWABLE_TABLET | Freq: Every day | ORAL | Status: DC
  Administered 2012-07-14 – 2012-07-15 (×2): 324 mg via ORAL
  Filled 2012-07-13 (×2): qty 4

## 2012-07-13 NOTE — Progress Notes (Signed)
Advanced Heart Failure Rounding Note   Subjective:    Logan [name] is a 37 year old male with coronary artery disease 01/2012, ischemic cardiomyopathy- Hx of anterolateral MI with DES to LAD EF 25%; reinfarction 02/05/12 while in [place], [place] with thrombosis of the stent. EF now down to 15-20% smokes 1/2 pack per day, stopped drinking alcohol 01/2012 (drinking 12 pack beer per day). Father, brother, sister, and mother all have heart disease  November 2013 - CPX Peak VO2: 21.9 % predicted peak VO2: 52.9% (41% of predicted) VE/VCO2 slope: 33.7 OUES: 1.53 Peak RER: 1.31 Ventilatory Threshold: 17.4 % predicted peak VO2: 42%  Readmitted with unstable angina. Cath results reviewed personally and discussed with Dr. Excell Seltzer. 3v CAD with 80% restenosis at edge of LAD stent. Referred for TCTS consultation and CABG planned for Monday  MRI reviewed: EF 18% Large scar. + LV clot  RHC 07/13/11: RA = 4  RV = 35/2/6  PA = 30/14 (20)  PCW = 15 v = 25  Fick cardiac output/index = 3.7/2.1  PVR = 1.4 [place]  FA sat = 97%  PA sat = 62%, 65%   Doing well. Ambulating halls without symptoms.    Objective:   Weight Range:  Vital Signs:   Temp:  [97.5 F (36.4 C)-97.9 F (36.6 C)] 97.5 F (36.4 C) (01/10 0407) Pulse Rate:  [53-61] 57  (01/10 0407) Resp:  [18] 18  (01/10 0407) BP: (100-106)/(63-68) 106/68 mmHg (01/10 0407) SpO2:  [98 %-100 %] 99 % (01/10 0928) Last BM Date: 07/10/12  Weight change: Filed Weights   07/09/12 1054 07/10/12 0617  Weight: 148 lb (67.132 kg) 148 lb (67.132 kg)    Intake/Output:   Intake/Output Summary (Last 24 hours) at 07/13/12 1311 Last data filed at 07/13/12 0830  Gross per 24 hour  Intake    603 ml  Output    700 ml  Net    -97 ml     Physical Exam: General:  Well appearing. No resp difficulty HEENT: normal Neck: supple. JVP flat . Carotids 2+ bilat; no bruits. No lymphadenopathy or thryomegaly appreciated. Cor: PMI laterally displaced. Regular rate &  rhythm. No rubs, gallops or murmurs. Lungs: clear Abdomen: soft, nontender, nondistended. No hepatosplenomegaly. No bruits or masses. Good bowel sounds. Extremities: no cyanosis, clubbing, rash, edema. R groin site is fine. Neuro: alert & orientedx3, cranial nerves grossly intact. moves all 4 extremities w/o difficulty. Affect pleasant  Telemetry: sinus rhythm  Labs: Basic Metabolic Panel:  Lab 07/13/12 1308 07/12/12 0510 07/09/12 1547 07/09/12 1326  NA 146* 138 -- 139  K 4.7 4.2 -- 4.6  CL 94* 105 -- 108  CO2 25 26 -- 23  GLUCOSE 96 87 -- 82  BUN 9 8 -- 8  CREATININE 0.76 0.78 0.75 0.87  CALCIUM 9.6 9.3 -- 9.2  MG -- -- -- --  PHOS -- -- -- --    Liver Function Tests: No results found for this basename: AST:5,ALT:5,ALKPHOS:5,BILITOT:5,PROT:5,ALBUMIN:5 in the last 168 hours No results found for this basename: LIPASE:5,AMYLASE:5 in the last 168 hours No results found for this basename: AMMONIA:3 in the last 168 hours  CBC:  Lab 07/13/12 0845 07/12/12 0510 07/11/12 0520 07/10/12 0555 07/09/12 1547  WBC 10.6* 7.6 7.2 6.7 6.8  NEUTROABS -- -- -- -- --  HGB 15.0 14.4 14.5 14.3 14.8  HCT 43.1 41.0 41.7 41.1 41.2  MCV 97.1 97.4 96.5 96.7 95.2  PLT 244 246 251 239 258    Cardiac Enzymes: No results  found for this basename: CKTOTAL:5,CKMB:5,CKMBINDEX:5,TROPONINI:5 in the last 168 hours  BNP: BNP (last 3 results) No results found for this basename: PROBNP:3 in the last 8760 hours    Imaging: No results found.   Medications:     Scheduled Medications:    . aspirin  324 mg Oral Daily  . atorvastatin  80 mg Oral q1800  . bisacodyl  5 mg Oral Once  . budesonide-formoterol  2 puff Inhalation BID  . carvedilol  9.375 mg Oral BID WC  . chlorhexidine  60 mL Topical Once  . digoxin  0.125 mg Oral q morning - 10a  . lisinopril  5 mg Oral QHS  . metoprolol tartrate  12.5 mg Oral Once  . sodium chloride  3 mL Intravenous Q12H    Infusions:    . heparin 1,400  Units/hr (07/13/12 0941)    PRN Medications: sodium chloride, acetaminophen, acetaminophen, LORazepam, nitroGLYCERIN, ondansetron (ZOFRAN) IV, ondansetron (ZOFRAN) IV, sodium chloride   Assessment:   1. Unstable angina 2. 3v CAD with recurrent stent restenosis at LAD site 3. 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 NSVT s/p Lifevest 5. H/o ETOH abuse quit 7/13 6. Tobacco abuse  Quit 07/04/12 7. H/o LV thrombus  Plan/Discussion:    Remains stable.  RHC with well compensated hemodynamics.  Plan for CABG on Monday  Length of Stay: 4   Logan [name] 3:33 PM

## 2012-07-13 NOTE — Progress Notes (Signed)
1 Day Post-Op Procedure(s) (LRB): RIGHT HEART CATH (N/A) Subjective: Unstable angina, ischemic cardiomyopathy,3VCAD RHC shows good cardiac compensation to low EF Brilinta washout in progress CABG MON 1-13 Objective: Vital signs in last 24 hours: Temp:  [97.5 F (36.4 C)-98.1 F (36.7 C)] 98.1 F (36.7 C) (01/10 1345) Pulse Rate:  [57-61] 60  (01/10 1345) Cardiac Rhythm:  [-] Normal sinus rhythm (01/10 0740) Resp:  [18] 18  (01/10 1345) BP: (88-106)/(51-68) 88/51 mmHg (01/10 1345) SpO2:  [98 %-100 %] 100 % (01/10 1345)  Hemodynamic parameters for last 24 hours:  nsr  Intake/Output from previous day: 01/09 0701 - 01/10 0700 In: 373 [P.O.:240; I.V.:133] Out: 700 [Urine:700] Intake/Output this shift: Total I/O In: 600 [P.O.:600] Out: 501 [Urine:500; Stool:1]    Lab Results:  [facility] 07/13/12 0845 07/12/12 0510  WBC 10.6* 7.6  HGB 15.0 14.4  HCT 43.1 41.0  PLT 244 246   BMET:  Basename 07/13/12 0845 07/12/12 0510  NA 146* 138  K 4.7 4.2  CL 94* 105  CO2 25 26  GLUCOSE 96 87  BUN 9 8  CREATININE 0.76 0.78  CALCIUM 9.6 9.3    PT/INR:  Basename 07/12/12 0510  LABPROT 13.5  INR 1.04   ABG    Component Value Date/Time   PHART 7.380 07/12/2012 1446   HCO3 21.3 07/12/2012 1450   HCO3 23.2 07/12/2012 1450   TCO2 22 07/12/2012 1450   TCO2 25 07/12/2012 1450   ACIDBASEDEF 4.0* 07/12/2012 1450   ACIDBASEDEF 3.0* 07/12/2012 1450   O2SAT 65.0 07/12/2012 1450   O2SAT 62.0 07/12/2012 1450   CBG (last 3)  No results found for this basename: GLUCAP:3 in the last 72 hours  Assessment/Plan: S/P Procedure(s) (LRB): RIGHT HEART CATH (N/A) CABG MON W/ LIMA, L RA   LOS: 4 days    VAN TRIGT III,Sidney Silberman 07/13/2012

## 2012-07-13 NOTE — Progress Notes (Signed)
ANTICOAGULATION CONSULT NOTE - Follow Up Consult  Pharmacy Consult for Heparin Indication: CAD and h/o LV thrombus  Allergies  Allergen Reactions  . Adhesive (Tape) Rash    "paper tape is ok" (07/09/2012)  . Bee Venom Anaphylaxis  . Penicillins Rash   Patient Measurements: Height: 5\' 8"  (172.7 cm) Weight: 148 lb (67.132 kg) IBW/kg (Calculated) : 68.4  Heparin dosing weight: 67kg  Vital Signs: Temp: 98.1 F (36.7 C) (01/10 1345) Temp src: Oral (01/10 1345) BP: 88/51 mmHg (01/10 1345) Pulse Rate: 60  (01/10 1345)  Labs:  Basename 07/13/12 1613 07/13/12 0845 07/12/12 0510 07/11/12 0520  HGB -- 15.0 14.4 --  HCT -- 43.1 41.0 41.7  PLT -- 244 246 251  APTT -- -- -- --  LABPROT -- -- 13.5 --  INR -- -- 1.04 --  HEPARINUNFRC 0.26* 0.17* 0.35 --  CREATININE -- 0.76 0.78 --  CKTOTAL -- -- -- --  CKMB -- -- -- --  TROPONINI -- -- -- --    Estimated Creatinine Clearance: 121.2 ml/min (by C-G formula based on Cr of 0.76).  Assessment: 36yom previously on heparin awaiting CABG on Monday.  Heparin level remains slightly subtherapeutic. No problems in infusion per nurse.  Goal of Therapy:  Heparin level 0.3-0.7 units/ml Monitor platelets by anticoagulation protocol: Yes   Plan:  1) Increase heparin to 1500 units/hr (24ml/hr) 2) F/u am heparin level.  Thank you,  Talbert Cage, PharmD 07/13/2012 5:11 PM

## 2012-07-13 NOTE — Progress Notes (Signed)
ANTICOAGULATION CONSULT NOTE - Follow Up Consult  Pharmacy Consult for Heparin Indication: CAD and h/o LV thrombus  Allergies  Allergen Reactions  . Adhesive (Tape) Rash    "paper tape is ok" (07/09/2012)  . Bee Venom Anaphylaxis  . Penicillins Rash   Patient Measurements: Height: 5\' 8"  (172.7 cm) Weight: 148 lb (67.132 kg) IBW/kg (Calculated) : 68.4  Heparin dosing weight: 67kg  Vital Signs: Temp: 97.5 F (36.4 C) (01/10 0407) Temp src: Oral (01/10 0407) BP: 106/68 mmHg (01/10 0407) Pulse Rate: 57  (01/10 0407)  Labs:  Basename 07/13/12 0845 07/12/12 0510 07/11/12 0520  HGB 15.0 14.4 --  HCT 43.1 41.0 41.7  PLT 244 246 251  APTT -- -- --  LABPROT -- 13.5 --  INR -- 1.04 --  HEPARINUNFRC 0.17* 0.35 0.40  CREATININE -- 0.78 --  CKTOTAL -- -- --  CKMB -- -- --  TROPONINI -- -- --    Estimated Creatinine Clearance: 121.2 ml/min (by C-G formula based on Cr of 0.78).  Assessment: 36yom previously on heparin awaiting CABG on Monday is now s/p right heart cath for HF and heparin resumed post cath and level this AM is subtherapeutic. No problems in infusion per nurse. CBC is wnl.   Goal of Therapy:  Heparin level 0.3-0.7 units/ml Monitor platelets by anticoagulation protocol: Yes   Plan:  1) Increase heparin to 1400 units/hr (29ml/hr) 2) F/u with 6 hour heparin level, timed collect   Thank you,  Brett Fairy, PharmD, BCPS 07/13/2012 9:40 AM

## 2012-07-14 DIAGNOSIS — I2589 Other forms of chronic ischemic heart disease: Principal | ICD-10-CM

## 2012-07-14 LAB — CBC
Hemoglobin: 14.1 g/dL (ref 13.0–17.0)
MCH: 33.6 pg (ref 26.0–34.0)
MCV: 96.2 fL (ref 78.0–100.0)
RBC: 4.2 MIL/uL — ABNORMAL LOW (ref 4.22–5.81)
WBC: 7.6 10*3/uL (ref 4.0–10.5)

## 2012-07-14 LAB — HEPARIN LEVEL (UNFRACTIONATED): Heparin Unfractionated: 0.45 IU/mL (ref 0.30–0.70)

## 2012-07-14 NOTE — Progress Notes (Signed)
   Primary cardiologist: Dr. Nicholes Mango  Subjective:   No angina symptoms or breathlessness at rest.   Objective:   Temp:  [97.4 F (36.3 C)-98.1 F (36.7 C)] 97.6 F (36.4 C) (01/11 0410) Pulse Rate:  [54-63] 54  (01/11 0410) Resp:  [18-20] 20  (01/11 0410) BP: (88-98)/(51-61) 91/56 mmHg (01/11 0410) SpO2:  [99 %-100 %] 99 % (01/11 0812) Last BM Date: 07/13/12  Filed Weights   07/09/12 1054 07/10/12 0617  Weight: 148 lb (67.132 kg) 148 lb (67.132 kg)    Intake/Output Summary (Last 24 hours) at 07/14/12 0918 Last data filed at 07/14/12 0246  Gross per 24 hour  Intake   1560 ml  Output   2201 ml  Net   -641 ml   Telemetry: Sinus rhythm and sinus bradycardia.  Exam:  General: NAD.  Lungs: Clear, nonlabored.  Cardiac: RRR, no gallop or significant murmur.  Extremities: No pitting.  Lab Results:  Basic Metabolic Panel:  Lab 07/13/12 1610 07/12/12 0510 07/09/12 1547 07/09/12 1326  NA 146* 138 -- 139  K 4.7 4.2 -- 4.6  CL 94* 105 -- 108  CO2 25 26 -- 23  GLUCOSE 96 87 -- 82  BUN 9 8 -- 8  CREATININE 0.76 0.78 0.75 --  CALCIUM 9.6 9.3 -- 9.2  MG -- -- -- --    CBC:  Lab 07/14/12 0445 07/13/12 0845 07/12/12 0510  WBC 7.6 10.6* 7.6  HGB 14.1 15.0 14.4  HCT 40.4 43.1 41.0  MCV 96.2 97.1 97.4  PLT 222 244 246     Medications:   Scheduled Medications:    . aspirin  324 mg Oral Daily  . atorvastatin  80 mg Oral q1800  . bisacodyl  5 mg Oral Once  . budesonide-formoterol  2 puff Inhalation BID  . carvedilol  9.375 mg Oral BID WC  . chlorhexidine  60 mL Topical Once  . digoxin  0.125 mg Oral q morning - 10a  . lisinopril  5 mg Oral QHS  . metoprolol tartrate  12.5 mg Oral Once  . sodium chloride  3 mL Intravenous Q12H     Infusions:    . heparin 1,500 Units/hr (07/13/12 1933)     PRN Medications:  sodium chloride, acetaminophen, acetaminophen, LORazepam, nitroGLYCERIN, ondansetron (ZOFRAN) IV, ondansetron (ZOFRAN) IV, sodium  chloride   Assessment:   1. Unstable angina   2. 3v CAD with recurrent stent restenosis at LAD site   3. Chronic systolic HF due to iCM - EF 20% by echo 04/22/12 - NYHA Class II-III  Recent right heart hemodynamics showed reasonable compensation.  4. NSVT s/p Lifevest   5. H/o ETOH abuse quit 7/13   6. Tobacco abuse Quit 07/04/12  7. H/o LV thrombus.   Plan/Discussion:    Plan is for CABG on Monday. Continue current regimen including intravenous heparin. Lab work for AM.   Jonelle Sidle, M.D., F.A.C.C.

## 2012-07-14 NOTE — Progress Notes (Signed)
ANTICOAGULATION CONSULT NOTE - Follow Up Consult  Pharmacy Consult for heparin Indication: CAD and h/o LV thrombus  Allergies  Allergen Reactions  . Adhesive (Tape) Rash    "paper tape is ok" (07/09/2012)  . Bee Venom Anaphylaxis  . Penicillins Rash    Patient Measurements: Height: 5\' 8"  (172.7 cm) Weight: 148 lb (67.132 kg) IBW/kg (Calculated) : 68.4  Heparin Dosing Weight: 67kg  Vital Signs: Temp: 97.6 F (36.4 C) (01/11 0410) Temp src: Oral (01/11 0410) BP: 104/75 mmHg (01/11 0825) Pulse Rate: 62  (01/11 0825)  Labs:  Basename 07/14/12 0445 07/13/12 1613 07/13/12 0845 07/12/12 0510  HGB 14.1 -- 15.0 --  HCT 40.4 -- 43.1 41.0  PLT 222 -- 244 246  APTT -- -- -- --  LABPROT -- -- -- 13.5  INR -- -- -- 1.04  HEPARINUNFRC 0.45 0.26* 0.17* --  CREATININE -- -- 0.76 0.78  CKTOTAL -- -- -- --  CKMB -- -- -- --  TROPONINI -- -- -- --    Estimated Creatinine Clearance: 121.2 ml/min (by C-G formula based on Cr of 0.76).  Assessment: Patient is a 37 y.o M on heparin for CAD and hx LV thrombus with plan for CABG on Monday.  Heparin level is therapeutic at 0.45.   No bleeding noted.   Goal of Therapy:  Heparin level 0.3-0.7 units/ml Monitor platelets by anticoagulation protocol: Yes   Plan:  1) continue current heparin regimen  Weslie Pretlow P 07/14/2012,10:16 AM

## 2012-07-15 DIAGNOSIS — E785 Hyperlipidemia, unspecified: Secondary | ICD-10-CM

## 2012-07-15 LAB — APTT: aPTT: 103 seconds — ABNORMAL HIGH (ref 24–37)

## 2012-07-15 LAB — CBC
HCT: 42.8 % (ref 39.0–52.0)
Hemoglobin: 15 g/dL (ref 13.0–17.0)
MCH: 33.7 pg (ref 26.0–34.0)
MCHC: 35 g/dL (ref 30.0–36.0)
RBC: 4.45 MIL/uL (ref 4.22–5.81)

## 2012-07-15 LAB — HEPARIN LEVEL (UNFRACTIONATED): Heparin Unfractionated: 0.62 IU/mL (ref 0.30–0.70)

## 2012-07-15 LAB — BASIC METABOLIC PANEL
CO2: 21 mEq/L (ref 19–32)
Calcium: 9.5 mg/dL (ref 8.4–10.5)
Chloride: 102 mEq/L (ref 96–112)
Glucose, Bld: 83 mg/dL (ref 70–99)
Sodium: 137 mEq/L (ref 135–145)

## 2012-07-15 MED ORDER — MAGNESIUM SULFATE 50 % IJ SOLN
40.0000 meq | INTRAMUSCULAR | Status: DC
  Filled 2012-07-15: qty 10

## 2012-07-15 MED ORDER — DEXMEDETOMIDINE HCL IN NACL 400 MCG/100ML IV SOLN
0.1000 ug/kg/h | INTRAVENOUS | Status: DC
  Filled 2012-07-15: qty 100

## 2012-07-15 MED ORDER — LEVOFLOXACIN IN D5W 500 MG/100ML IV SOLN
500.0000 mg | INTRAVENOUS | Status: AC
  Administered 2012-07-16: 500 mg via INTRAVENOUS
  Filled 2012-07-15 (×2): qty 100

## 2012-07-15 MED ORDER — NITROGLYCERIN IN D5W 200-5 MCG/ML-% IV SOLN
2.0000 ug/min | INTRAVENOUS | Status: DC
  Filled 2012-07-15: qty 250

## 2012-07-15 MED ORDER — CARVEDILOL 6.25 MG PO TABS
6.2500 mg | ORAL_TABLET | Freq: Two times a day (BID) | ORAL | Status: DC
  Administered 2012-07-15 (×2): 6.25 mg via ORAL
  Filled 2012-07-15 (×5): qty 1

## 2012-07-15 MED ORDER — VERAPAMIL HCL 2.5 MG/ML IV SOLN
INTRAVENOUS | Status: AC
  Administered 2012-07-16: 09:00:00
  Filled 2012-07-15: qty 2.5

## 2012-07-15 MED ORDER — SODIUM CHLORIDE 0.9 % IV SOLN
INTRAVENOUS | Status: DC
  Filled 2012-07-15: qty 1

## 2012-07-15 MED ORDER — DEXTROSE 5 % IV SOLN
30.0000 ug/min | INTRAVENOUS | Status: DC
  Filled 2012-07-15: qty 2

## 2012-07-15 MED ORDER — POTASSIUM CHLORIDE 2 MEQ/ML IV SOLN
80.0000 meq | INTRAVENOUS | Status: DC
  Filled 2012-07-15: qty 40

## 2012-07-15 MED ORDER — EPINEPHRINE HCL 1 MG/ML IJ SOLN
0.5000 ug/min | INTRAVENOUS | Status: DC
  Filled 2012-07-15: qty 4

## 2012-07-15 MED ORDER — VANCOMYCIN HCL 10 G IV SOLR
1250.0000 mg | INTRAVENOUS | Status: AC
  Administered 2012-07-16: 1250 mg via INTRAVENOUS
  Filled 2012-07-15: qty 1250

## 2012-07-15 MED ORDER — SODIUM CHLORIDE 0.9 % IV SOLN
INTRAVENOUS | Status: DC
  Filled 2012-07-15: qty 40

## 2012-07-15 MED ORDER — DOPAMINE-DEXTROSE 3.2-5 MG/ML-% IV SOLN
2.0000 ug/kg/min | INTRAVENOUS | Status: DC
  Filled 2012-07-15: qty 250

## 2012-07-15 NOTE — Progress Notes (Signed)
Pt's heart rate briefly dropped to 26 then went back up to the 50s. Pt asymptomatic, lying in bed. Other vital signs stable. Rhythm strip in paper chart. Will continue to monitor.   Alfonso Ellis, RN

## 2012-07-15 NOTE — Progress Notes (Signed)
Pt ambulated 550 ft around unit with no assistance x5. Pt did not have any complaints. Will continue to monitor pt.

## 2012-07-15 NOTE — Progress Notes (Signed)
   Primary cardiologist: Dr. Nicholes Mango  Subjective:   States that he slept well, stable appetite, no angina.   Objective:   Temp:  [97.6 F (36.4 C)-98.3 F (36.8 C)] 97.8 F (36.6 C) (01/12 0446) Pulse Rate:  [54-66] 59  (01/12 0446) Resp:  [16-18] 18  (01/12 0446) BP: (93-110)/(59-75) 93/59 mmHg (01/12 0446) SpO2:  [97 %-99 %] 98 % (01/12 0446) Weight:  [146 lb 14.4 oz (66.633 kg)] 146 lb 14.4 oz (66.633 kg) (01/12 0446) Last BM Date: 07/14/12  Filed Weights   07/09/12 1054 07/10/12 0617 07/15/12 0446  Weight: 148 lb (67.132 kg) 148 lb (67.132 kg) 146 lb 14.4 oz (66.633 kg)    Intake/Output Summary (Last 24 hours) at 07/15/12 0755 Last data filed at 07/15/12 0041  Gross per 24 hour  Intake    600 ml  Output   1750 ml  Net  -1150 ml   Telemetry: Sinus rhythm and sinus bradycardia. Episode of marked bradycardia noted.  Exam:  General: NAD.  Lungs: Clear, nonlabored.  Cardiac: RRR, no gallop or significant murmur.  Extremities: No pitting.  Lab Results:  Basic Metabolic Panel:  Lab 07/13/12 9811 07/12/12 0510 07/09/12 1547 07/09/12 1326  NA 146* 138 -- 139  K 4.7 4.2 -- 4.6  CL 94* 105 -- 108  CO2 25 26 -- 23  GLUCOSE 96 87 -- 82  BUN 9 8 -- 8  CREATININE 0.76 0.78 0.75 --  CALCIUM 9.6 9.3 -- 9.2  MG -- -- -- --    CBC:  Lab 07/15/12 0600 07/14/12 0445 07/13/12 0845  WBC 9.3 7.6 10.6*  HGB 15.0 14.1 15.0  HCT 42.8 40.4 43.1  MCV 96.2 96.2 97.1  PLT 223 222 244     Medications:   Scheduled Medications:    . aspirin  324 mg Oral Daily  . atorvastatin  80 mg Oral q1800  . bisacodyl  5 mg Oral Once  . budesonide-formoterol  2 puff Inhalation BID  . carvedilol  9.375 mg Oral BID WC  . chlorhexidine  60 mL Topical Once  . digoxin  0.125 mg Oral q morning - 10a  . lisinopril  5 mg Oral QHS  . metoprolol tartrate  12.5 mg Oral Once  . sodium chloride  3 mL Intravenous Q12H    Infusions:    . heparin 1,500 Units/hr (07/15/12 0319)     PRN Medications: sodium chloride, acetaminophen, acetaminophen, LORazepam, nitroGLYCERIN, ondansetron (ZOFRAN) IV, ondansetron (ZOFRAN) IV, sodium chloride   Assessment:   1. Unstable angina   2. 3v CAD with recurrent stent restenosis at LAD site   3. Chronic systolic HF due to iCM - EF 20% by echo 04/22/12 - NYHA Class II-III  Recent right heart hemodynamics showed reasonable compensation.  4. NSVT s/p Lifevest   5. H/o ETOH abuse quit 7/13   6. Tobacco abuse Quit 07/04/12  7. H/o LV thrombus.  8. Sinus bradycardia, at times marked.   Plan/Discussion:    Plan is for CABG on Monday. Continue current regimen including intravenous heparin, however will decrease Coreg to 6.25 mg twice daily. Lab work for AM.   Jonelle Sidle, M.D., F.A.C.C.

## 2012-07-15 NOTE — Progress Notes (Signed)
ANTICOAGULATION CONSULT NOTE - Follow Up Consult  Pharmacy Consult for heparin Indication: CAD and h/o LV thrombus   Allergies  Allergen Reactions  . Adhesive (Tape) Rash    "paper tape is ok" (07/09/2012)  . Bee Venom Anaphylaxis  . Penicillins Rash    Patient Measurements: Height: 5\' 8"  (172.7 cm) Weight: 146 lb 14.4 oz (66.633 kg) IBW/kg (Calculated) : 68.4  Heparin Dosing Weight: 67 kg  Vital Signs: Temp: 97.8 F (36.6 C) (01/12 0446) Temp src: Oral (01/12 0446) BP: 93/59 mmHg (01/12 0446) Pulse Rate: 59  (01/12 0446)  Labs:  Basename 07/15/12 0600 07/14/12 0445 07/13/12 1613 07/13/12 0845  HGB 15.0 14.1 -- --  HCT 42.8 40.4 -- 43.1  PLT 223 222 -- 244  APTT 103* -- -- --  LABPROT -- -- -- --  INR -- -- -- --  HEPARINUNFRC 0.62 0.45 0.26* --  CREATININE 0.69 -- -- 0.76  CKTOTAL -- -- -- --  CKMB -- -- -- --  TROPONINI -- -- -- --    Estimated Creatinine Clearance: 120.3 ml/min (by C-G formula based on Cr of 0.69).  Assessment: Patient is a 37 y.o M on heparin for CAD and hx LV thrombus with plan for CABG tomorrow.  Heparin level is at goal.  No bleeding noted.  Goal of Therapy:  Heparin level 0.3-0.7 units/ml Monitor platelets by anticoagulation protocol: Yes   Plan:  1) no change for heparin 2) f/u with patient after procedure tomorrow  Kimoni Pagliarulo P 07/15/2012,10:31 AM

## 2012-07-15 NOTE — Progress Notes (Signed)
   CARDIOTHORACIC SURGERY PROGRESS NOTE  3 Days Post-Op  S/P Procedure(s) (LRB): RIGHT HEART CATH (N/A)  Subjective: No chest pain. No questions  Objective: Vital signs in last 24 hours: Temp:  [97.6 F (36.4 C)-98.3 F (36.8 C)] 97.8 F (36.6 C) (01/12 0446) Pulse Rate:  [54-66] 60  (01/12 1122) Cardiac Rhythm:  [-] Normal sinus rhythm (01/12 0815) Resp:  [16-18] 18  (01/12 0446) BP: (93-110)/(59-66) 93/59 mmHg (01/12 0446) SpO2:  [97 %-99 %] 98 % (01/12 0446) Weight:  [66.633 kg (146 lb 14.4 oz)] 66.633 kg (146 lb 14.4 oz) (01/12 0446)  Physical Exam:  Rhythm:   sinus  Breath sounds: clear  Heart sounds:  RRR  Incisions:  n/a  Abdomen:  soft  Extremities:  warm   Intake/Output from previous day: 01/11 0701 - 01/12 0700 In: 600 [P.O.:600] Out: 1750 [Urine:1750] Intake/Output this shift:    Lab Results:  Basename 07/15/12 0600 07/14/12 0445  WBC 9.3 7.6  HGB 15.0 14.1  HCT 42.8 40.4  PLT 223 222   BMET:  Basename 07/15/12 0600 07/13/12 0845  NA 137 146*  K 4.0 4.7  CL 102 94*  CO2 21 25  GLUCOSE 83 96  BUN 10 9  CREATININE 0.69 0.76  CALCIUM 9.5 9.6    CBG (last 3)  No results found for this basename: GLUCAP:3 in the last 72 hours PT/INR:  No results found for this basename: LABPROT,INR in the last 72 hours  CXR:  *RADIOLOGY REPORT*  Clinical Data: Chest pain. Coronary artery disease.  CHEST - 2 VIEW  Comparison: Single view of the chest 04/22/2012.  Findings: Lungs clear. Mild cardiomegaly. No pneumothorax or  pleural effusion.  IMPRESSION:  Mild cardiomegaly without acute disease.  Original Report Authenticated By: Holley Dexter, M.D.   Assessment/Plan: S/P Procedure(s) (LRB): RIGHT HEART CATH (N/A)  Stable For CABG in am per Dr Wynonia Musty H 07/15/2012 1:41 PM

## 2012-07-16 ENCOUNTER — Encounter (HOSPITAL_COMMUNITY): Payer: Self-pay | Admitting: Anesthesiology

## 2012-07-16 ENCOUNTER — Encounter (HOSPITAL_COMMUNITY)
Admission: RE | Disposition: A | Discharge status: Home / Self Care | Payer: Self-pay | Source: Ambulatory Visit | Attending: Internal Medicine

## 2012-07-16 ENCOUNTER — Observation Stay (HOSPITAL_COMMUNITY): Payer: Medicaid Other | Admitting: Anesthesiology

## 2012-07-16 ENCOUNTER — Inpatient Hospital Stay (HOSPITAL_COMMUNITY): Payer: Medicaid Other

## 2012-07-16 DIAGNOSIS — I2510 Atherosclerotic heart disease of native coronary artery without angina pectoris: Secondary | ICD-10-CM

## 2012-07-16 HISTORY — PX: INTRAOPERATIVE TRANSESOPHAGEAL ECHOCARDIOGRAM: SHX5062

## 2012-07-16 HISTORY — PX: CORONARY ARTERY BYPASS GRAFT: SHX141

## 2012-07-16 LAB — POCT I-STAT, CHEM 8
Calcium, Ion: 1.28 mmol/L — ABNORMAL HIGH (ref 1.12–1.23)
Chloride: 106 mEq/L (ref 96–112)
Glucose, Bld: 134 mg/dL — ABNORMAL HIGH (ref 70–99)
HCT: 33 % — ABNORMAL LOW (ref 39.0–52.0)
TCO2: 23 mmol/L (ref 0–100)

## 2012-07-16 LAB — POCT I-STAT 3, ART BLOOD GAS (G3+)
Acid-base deficit: 2 mmol/L (ref 0.0–2.0)
Acid-base deficit: 1 mmol/L (ref 0.0–2.0)
Acid-base deficit: 3 mmol/L — ABNORMAL HIGH (ref 0.0–2.0)
Bicarbonate: 22.2 mEq/L (ref 20.0–24.0)
Bicarbonate: 23.9 mEq/L (ref 20.0–24.0)
Bicarbonate: 23 mEq/L (ref 20.0–24.0)
O2 Saturation: 99 %
O2 Saturation: 99 %
O2 Saturation: 99 %
Patient temperature: 36.5
Patient temperature: 37.2
Patient temperature: 37.33
TCO2: 23 mmol/L (ref 0–100)
TCO2: 25 mmol/L (ref 0–100)
TCO2: 25 mmol/L (ref 0–100)
TCO2: 24 mmol/L (ref 0–100)
pCO2 arterial: 43.9 mmHg (ref 35.0–45.0)
pCO2 arterial: 50 mmHg — ABNORMAL HIGH (ref 35.0–45.0)
pCO2 arterial: 40.7 mmHg (ref 35.0–45.0)
pH, Arterial: 7.345 — ABNORMAL LOW (ref 7.350–7.450)
pH, Arterial: 7.37 (ref 7.350–7.450)
pH, Arterial: 7.409 (ref 7.350–7.450)
pH, Arterial: 7.335 — ABNORMAL LOW (ref 7.350–7.450)
pO2, Arterial: 288 mmHg — ABNORMAL HIGH (ref 80.0–100.0)
pO2, Arterial: 149 mmHg — ABNORMAL HIGH (ref 80.0–100.0)

## 2012-07-16 LAB — BASIC METABOLIC PANEL
BUN: 9 mg/dL (ref 6–23)
CO2: 21 mEq/L (ref 19–32)
Chloride: 100 mEq/L (ref 96–112)
Glucose, Bld: 87 mg/dL (ref 70–99)
Potassium: 4 mEq/L (ref 3.5–5.1)
Sodium: 134 mEq/L — ABNORMAL LOW (ref 135–145)

## 2012-07-16 LAB — POCT I-STAT 4, (NA,K, GLUC, HGB,HCT)
Glucose, Bld: 113 mg/dL — ABNORMAL HIGH (ref 70–99)
Glucose, Bld: 111 mg/dL — ABNORMAL HIGH (ref 70–99)
HCT: 26 % — ABNORMAL LOW (ref 39.0–52.0)
HCT: 35 % — ABNORMAL LOW (ref 39.0–52.0)
Hemoglobin: 12.6 g/dL — ABNORMAL LOW (ref 13.0–17.0)
Hemoglobin: 8.8 g/dL — ABNORMAL LOW (ref 13.0–17.0)
Hemoglobin: 8.8 g/dL — ABNORMAL LOW (ref 13.0–17.0)
Potassium: 4 mEq/L (ref 3.5–5.1)
Potassium: 6.9 mEq/L (ref 3.5–5.1)
Potassium: 3.9 mEq/L (ref 3.5–5.1)
Sodium: 138 mEq/L (ref 135–145)
Sodium: 139 mEq/L (ref 135–145)
Sodium: 130 mEq/L — ABNORMAL LOW (ref 135–145)

## 2012-07-16 LAB — CBC
HCT: 40.9 % (ref 39.0–52.0)
HCT: 32.2 % — ABNORMAL LOW (ref 39.0–52.0)
Hemoglobin: 14 g/dL (ref 13.0–17.0)
Hemoglobin: 11.4 g/dL — ABNORMAL LOW (ref 13.0–17.0)
MCH: 34 pg (ref 26.0–34.0)
MCH: 33.7 pg (ref 26.0–34.0)
MCHC: 35.7 g/dL (ref 30.0–36.0)
MCHC: 35.4 g/dL (ref 30.0–36.0)
MCV: 95.8 fL (ref 78.0–100.0)
MCV: 95.3 fL (ref 78.0–100.0)
MCV: 95.3 fL (ref 78.0–100.0)
Platelets: 124 10*3/uL — ABNORMAL LOW (ref 150–400)
Platelets: 147 10*3/uL — ABNORMAL LOW (ref 150–400)
RBC: 3.38 MIL/uL — ABNORMAL LOW (ref 4.22–5.81)
RDW: 12.8 % (ref 11.5–15.5)
RDW: 12.7 % (ref 11.5–15.5)
RDW: 12.9 % (ref 11.5–15.5)
WBC: 7.2 10*3/uL (ref 4.0–10.5)
WBC: 15.6 10*3/uL — ABNORMAL HIGH (ref 4.0–10.5)

## 2012-07-16 LAB — TYPE AND SCREEN
ABO/RH(D): O POS
Antibody Screen: NEGATIVE

## 2012-07-16 LAB — GLUCOSE, CAPILLARY: Glucose-Capillary: 97 mg/dL (ref 70–99)

## 2012-07-16 LAB — HEMOGLOBIN AND HEMATOCRIT, BLOOD
HCT: 26.1 % — ABNORMAL LOW (ref 39.0–52.0)
Hemoglobin: 9.4 g/dL — ABNORMAL LOW (ref 13.0–17.0)

## 2012-07-16 LAB — CREATININE, SERUM
Creatinine, Ser: 0.74 mg/dL (ref 0.50–1.35)
GFR calc Af Amer: >90 mL/min (ref >90)
GFR calc non Af Amer: >90 mL/min (ref >90)

## 2012-07-16 LAB — SURGICAL PCR SCREEN
MRSA, PCR: NEGATIVE
Staphylococcus aureus: NEGATIVE

## 2012-07-16 LAB — POCT I-STAT GLUCOSE
Glucose, Bld: 91 mg/dL (ref 70–99)
Glucose, Bld: 117 mg/dL — ABNORMAL HIGH (ref 70–99)
Operator id: 195151

## 2012-07-16 LAB — PLATELET COUNT: Platelets: 100 10*3/uL — ABNORMAL LOW (ref 150–400)

## 2012-07-16 LAB — MAGNESIUM: Magnesium: 2.7 mg/dL — ABNORMAL HIGH (ref 1.5–2.5)

## 2012-07-16 SURGERY — CORONARY ARTERY BYPASS GRAFTING (CABG)
Anesthesia: General | Site: Chest | Wound class: Clean

## 2012-07-16 MED ORDER — MORPHINE SULFATE 2 MG/ML IJ SOLN: 1.0000 mg | INTRAMUSCULAR | Status: AC | PRN

## 2012-07-16 MED ORDER — VANCOMYCIN HCL IN DEXTROSE 1-5 GM/200ML-% IV SOLN
1000.0000 mg | Freq: Once | INTRAVENOUS | Status: AC
  Administered 2012-07-16: 1000 mg via INTRAVENOUS
  Filled 2012-07-16: qty 200

## 2012-07-16 MED ORDER — 0.9 % SODIUM CHLORIDE (POUR BTL) OPTIME
TOPICAL | Status: DC | PRN
  Administered 2012-07-16: 2000 mL

## 2012-07-16 MED ORDER — FENTANYL CITRATE 0.05 MG/ML IJ SOLN
INTRAMUSCULAR | Status: DC | PRN
  Administered 2012-07-16: 100 ug via INTRAVENOUS
  Administered 2012-07-16: 150 ug via INTRAVENOUS
  Administered 2012-07-16: 100 ug via INTRAVENOUS
  Administered 2012-07-16 (×2): 50 ug via INTRAVENOUS
  Administered 2012-07-16: 850 ug via INTRAVENOUS
  Administered 2012-07-16 (×3): 50 ug via INTRAVENOUS
  Administered 2012-07-16 (×3): 100 ug via INTRAVENOUS

## 2012-07-16 MED ORDER — MIDAZOLAM HCL 2 MG/2ML IJ SOLN: 2.0000 mg | INTRAMUSCULAR | Status: DC | PRN

## 2012-07-16 MED ORDER — LACTATED RINGERS IV SOLN: 500.0000 mL | Freq: Once | INTRAVENOUS | Status: AC | PRN

## 2012-07-16 MED ORDER — OXYCODONE HCL 5 MG PO TABS
5.0000 mg | ORAL_TABLET | ORAL | Status: DC | PRN
  Administered 2012-07-16 – 2012-07-21 (×17): 10 mg via ORAL
  Filled 2012-07-16 (×3): qty 2
  Filled 2012-07-16: qty 1
  Filled 2012-07-16 (×13): qty 2
  Filled 2012-07-16: qty 1
  Filled 2012-07-16: qty 2

## 2012-07-16 MED ORDER — LACTATED RINGERS IV SOLN
INTRAVENOUS | Status: DC | PRN
  Administered 2012-07-16 (×2): via INTRAVENOUS

## 2012-07-16 MED ORDER — NITROGLYCERIN IN D5W 200-5 MCG/ML-% IV SOLN: 0.0000 ug/min | INTRAVENOUS | Status: DC

## 2012-07-16 MED ORDER — VECURONIUM BROMIDE 10 MG IV SOLR
INTRAVENOUS | Status: DC | PRN
  Administered 2012-07-16: 2 mg via INTRAVENOUS
  Administered 2012-07-16: 5 mg via INTRAVENOUS
  Administered 2012-07-16: 4 mg via INTRAVENOUS
  Administered 2012-07-16: 5 mg via INTRAVENOUS
  Administered 2012-07-16: 4 mg via INTRAVENOUS

## 2012-07-16 MED ORDER — METOPROLOL TARTRATE 12.5 MG HALF TABLET
12.5000 mg | ORAL_TABLET | Freq: Two times a day (BID) | ORAL | Status: DC
  Administered 2012-07-18: 12.5 mg via ORAL
  Filled 2012-07-16 (×5): qty 1

## 2012-07-16 MED ORDER — POTASSIUM CHLORIDE 10 MEQ/50ML IV SOLN
10.0000 meq | INTRAVENOUS | Status: AC
  Administered 2012-07-16 (×3): 10 meq via INTRAVENOUS

## 2012-07-16 MED ORDER — PHENYLEPHRINE HCL 10 MG/ML IJ SOLN
10.0000 mg | INTRAVENOUS | Status: DC | PRN
  Administered 2012-07-16: 50 ug/min via INTRAVENOUS

## 2012-07-16 MED ORDER — BISACODYL 10 MG RE SUPP: 10.0000 mg | Freq: Every day | RECTAL | Status: DC

## 2012-07-16 MED ORDER — MAGNESIUM SULFATE 40 MG/ML IJ SOLN
4.0000 g | Freq: Once | INTRAMUSCULAR | Status: AC
  Administered 2012-07-16: 4 g via INTRAVENOUS
  Filled 2012-07-16: qty 100

## 2012-07-16 MED ORDER — PROTAMINE SULFATE 10 MG/ML IV SOLN
INTRAVENOUS | Status: DC | PRN
  Administered 2012-07-16: 200 mg via INTRAVENOUS

## 2012-07-16 MED ORDER — METOCLOPRAMIDE HCL 5 MG/ML IJ SOLN
10.0000 mg | Freq: Four times a day (QID) | INTRAMUSCULAR | Status: DC
  Administered 2012-07-16 – 2012-07-18 (×7): 10 mg via INTRAVENOUS
  Filled 2012-07-16 (×11): qty 2

## 2012-07-16 MED ORDER — LIDOCAINE IN D5W 4-5 MG/ML-% IV SOLN
1.0000 mg/min | INTRAVENOUS | Status: AC
  Administered 2012-07-16: 1 mg/min via INTRAVENOUS
  Filled 2012-07-16: qty 250

## 2012-07-16 MED ORDER — DOPAMINE-DEXTROSE 3.2-5 MG/ML-% IV SOLN: 3.0000 ug/kg/min | INTRAVENOUS | Status: DC

## 2012-07-16 MED ORDER — METOCLOPRAMIDE HCL 5 MG/ML IJ SOLN: 10.0000 mg | Freq: Four times a day (QID) | INTRAMUSCULAR | Status: DC | PRN

## 2012-07-16 MED ORDER — PANTOPRAZOLE SODIUM 40 MG PO TBEC
40.0000 mg | DELAYED_RELEASE_TABLET | Freq: Every day | ORAL | Status: DC
  Administered 2012-07-18 – 2012-07-19 (×2): 40 mg via ORAL
  Filled 2012-07-16 (×2): qty 1

## 2012-07-16 MED ORDER — ARTIFICIAL TEARS OP OINT
TOPICAL_OINTMENT | OPHTHALMIC | Status: DC | PRN
  Administered 2012-07-16: 1 via OPHTHALMIC

## 2012-07-16 MED ORDER — DEXMEDETOMIDINE HCL IN NACL 200 MCG/50ML IV SOLN
0.1000 ug/kg/h | INTRAVENOUS | Status: DC
  Filled 2012-07-16: qty 50

## 2012-07-16 MED ORDER — HEPARIN SODIUM (PORCINE) 1000 UNIT/ML IJ SOLN
INTRAMUSCULAR | Status: DC | PRN
  Administered 2012-07-16: 3000 [IU] via INTRAVENOUS
  Administered 2012-07-16: 16000 [IU] via INTRAVENOUS
  Administered 2012-07-16: 3000 [IU] via INTRAVENOUS
  Administered 2012-07-16: 2000 [IU] via INTRAVENOUS

## 2012-07-16 MED ORDER — DEXMEDETOMIDINE HCL IN NACL 200 MCG/50ML IV SOLN
INTRAVENOUS | Status: DC | PRN
  Administered 2012-07-16: 0.2 ug/kg/h via INTRAVENOUS

## 2012-07-16 MED ORDER — SODIUM CHLORIDE 0.45 % IV SOLN
INTRAVENOUS | Status: DC
  Administered 2012-07-16: 20 mL via INTRAVENOUS

## 2012-07-16 MED ORDER — SODIUM CHLORIDE 0.9 % IV SOLN
10.0000 g | INTRAVENOUS | Status: DC | PRN
  Administered 2012-07-16: 1 g/h via INTRAVENOUS

## 2012-07-16 MED ORDER — ASPIRIN 81 MG PO CHEW: 324.0000 mg | CHEWABLE_TABLET | Freq: Every day | ORAL | Status: DC

## 2012-07-16 MED ORDER — SODIUM CHLORIDE 0.9 % IV SOLN: INTRAVENOUS | Status: DC

## 2012-07-16 MED ORDER — DOPAMINE-DEXTROSE 1.6-5 MG/ML-% IV SOLN
INTRAVENOUS | Status: DC | PRN
  Administered 2012-07-16: 3 ug/kg/min via INTRAVENOUS

## 2012-07-16 MED ORDER — SODIUM CHLORIDE 0.9 % IV SOLN
100.0000 [IU] | INTRAVENOUS | Status: DC | PRN
  Administered 2012-07-16: .7 [IU] via INTRAVENOUS

## 2012-07-16 MED ORDER — PROMETHAZINE HCL 25 MG/ML IJ SOLN: 12.5000 mg | Freq: Four times a day (QID) | INTRAMUSCULAR | Status: DC | PRN

## 2012-07-16 MED ORDER — LIDOCAINE IN D5W 4-5 MG/ML-% IV SOLN
1.0000 mg/min | INTRAVENOUS | Status: DC
  Filled 2012-07-16: qty 250

## 2012-07-16 MED ORDER — NITROGLYCERIN IN D5W 200-5 MCG/ML-% IV SOLN
INTRAVENOUS | Status: DC | PRN
  Administered 2012-07-16: 5 ug/min via INTRAVENOUS

## 2012-07-16 MED ORDER — PHENYLEPHRINE HCL 10 MG/ML IJ SOLN
0.0000 ug/min | INTRAVENOUS | Status: DC
  Administered 2012-07-17: 25 ug/min via INTRAVENOUS
  Filled 2012-07-16: qty 2

## 2012-07-16 MED ORDER — LEVOFLOXACIN IN D5W 750 MG/150ML IV SOLN
750.0000 mg | INTRAVENOUS | Status: AC
  Administered 2012-07-17: 750 mg via INTRAVENOUS
  Filled 2012-07-16: qty 150

## 2012-07-16 MED ORDER — ROCURONIUM BROMIDE 100 MG/10ML IV SOLN
INTRAVENOUS | Status: DC | PRN
  Administered 2012-07-16: 50 mg via INTRAVENOUS

## 2012-07-16 MED ORDER — ALBUMIN HUMAN 5 % IV SOLN
250.0000 mL | INTRAVENOUS | Status: AC | PRN
  Administered 2012-07-16: 250 mL via INTRAVENOUS

## 2012-07-16 MED ORDER — BISACODYL 5 MG PO TBEC
10.0000 mg | DELAYED_RELEASE_TABLET | Freq: Every day | ORAL | Status: DC
  Administered 2012-07-17 – 2012-07-19 (×3): 10 mg via ORAL
  Filled 2012-07-16 (×3): qty 2

## 2012-07-16 MED ORDER — MILRINONE IN DEXTROSE 20 MG/100ML IV SOLN
INTRAVENOUS | Status: DC | PRN
  Administered 2012-07-16: .3 ug/kg/min via INTRAVENOUS

## 2012-07-16 MED ORDER — PROPOFOL 10 MG/ML IV BOLUS
INTRAVENOUS | Status: DC | PRN
  Administered 2012-07-16: 80 mg via INTRAVENOUS

## 2012-07-16 MED ORDER — ASPIRIN EC 325 MG PO TBEC
325.0000 mg | DELAYED_RELEASE_TABLET | Freq: Every day | ORAL | Status: DC
  Administered 2012-07-17 – 2012-07-21 (×5): 325 mg via ORAL
  Filled 2012-07-16 (×5): qty 1

## 2012-07-16 MED ORDER — LACTATED RINGERS IV SOLN
INTRAVENOUS | Status: DC | PRN
  Administered 2012-07-16 (×2): via INTRAVENOUS

## 2012-07-16 MED ORDER — FAMOTIDINE IN NACL 20-0.9 MG/50ML-% IV SOLN
20.0000 mg | Freq: Two times a day (BID) | INTRAVENOUS | Status: DC
  Administered 2012-07-16: 20 mg via INTRAVENOUS

## 2012-07-16 MED ORDER — ACETAMINOPHEN 160 MG/5ML PO SOLN
975.0000 mg | Freq: Four times a day (QID) | ORAL | Status: DC
  Filled 2012-07-16: qty 40.6

## 2012-07-16 MED ORDER — LEVALBUTEROL HCL 1.25 MG/0.5ML IN NEBU
1.2500 mg | INHALATION_SOLUTION | Freq: Three times a day (TID) | RESPIRATORY_TRACT | Status: DC
  Administered 2012-07-16 – 2012-07-17 (×2): 1.25 mg via RESPIRATORY_TRACT
  Filled 2012-07-16 (×6): qty 0.5

## 2012-07-16 MED ORDER — LACTATED RINGERS IV SOLN: INTRAVENOUS | Status: DC

## 2012-07-16 MED ORDER — GLYCOPYRROLATE 0.2 MG/ML IJ SOLN
INTRAMUSCULAR | Status: DC | PRN
  Administered 2012-07-16 (×2): 0.2 mg via INTRAVENOUS

## 2012-07-16 MED ORDER — ONDANSETRON HCL 4 MG/2ML IJ SOLN
4.0000 mg | Freq: Four times a day (QID) | INTRAMUSCULAR | Status: DC | PRN
  Filled 2012-07-16: qty 2

## 2012-07-16 MED ORDER — SODIUM CHLORIDE 0.9 % IV SOLN: 250.0000 mL | INTRAVENOUS | Status: DC | PRN

## 2012-07-16 MED ORDER — METOPROLOL TARTRATE 25 MG/10 ML ORAL SUSPENSION
12.5000 mg | Freq: Two times a day (BID) | ORAL | Status: DC
  Filled 2012-07-16 (×5): qty 5

## 2012-07-16 MED ORDER — ACETAMINOPHEN 500 MG PO TABS
1000.0000 mg | ORAL_TABLET | Freq: Four times a day (QID) | ORAL | Status: DC
  Administered 2012-07-16 – 2012-07-21 (×15): 1000 mg via ORAL
  Filled 2012-07-16 (×24): qty 2

## 2012-07-16 MED ORDER — SODIUM CHLORIDE 0.9 % IJ SOLN: 3.0000 mL | INTRAMUSCULAR | Status: DC | PRN

## 2012-07-16 MED ORDER — MORPHINE SULFATE 2 MG/ML IJ SOLN
2.0000 mg | INTRAMUSCULAR | Status: DC | PRN
  Administered 2012-07-16 – 2012-07-17 (×4): 4 mg via INTRAVENOUS
  Administered 2012-07-17: 2 mg via INTRAVENOUS
  Administered 2012-07-17 – 2012-07-18 (×2): 4 mg via INTRAVENOUS
  Filled 2012-07-16 (×5): qty 2
  Filled 2012-07-16: qty 1
  Filled 2012-07-16: qty 2

## 2012-07-16 MED ORDER — DOCUSATE SODIUM 100 MG PO CAPS
200.0000 mg | ORAL_CAPSULE | Freq: Every day | ORAL | Status: DC
  Administered 2012-07-17 – 2012-07-19 (×3): 200 mg via ORAL
  Filled 2012-07-16 (×4): qty 2

## 2012-07-16 MED ORDER — LIDOCAINE HCL (CARDIAC) 20 MG/ML IV SOLN
INTRAVENOUS | Status: DC | PRN
  Administered 2012-07-16: 20 mg via INTRAVENOUS

## 2012-07-16 MED ORDER — MILRINONE IN DEXTROSE 20 MG/100ML IV SOLN
0.3000 ug/kg/min | INTRAVENOUS | Status: DC
  Administered 2012-07-17: 0.3 ug/kg/min via INTRAVENOUS
  Filled 2012-07-16: qty 100

## 2012-07-16 MED ORDER — CALCIUM CHLORIDE 10 % IV SOLN
INTRAVENOUS | Status: DC | PRN
  Administered 2012-07-16: 0.5 g via INTRAVENOUS

## 2012-07-16 MED ORDER — AMINOCAPROIC ACID 250 MG/ML IV SOLN
INTRAVENOUS | Status: DC | PRN
  Administered 2012-07-16: 5 g via INTRAVENOUS

## 2012-07-16 MED ORDER — SODIUM CHLORIDE 0.9 % IV SOLN
INTRAVENOUS | Status: DC
  Filled 2012-07-16: qty 1

## 2012-07-16 MED ORDER — ACETAMINOPHEN 10 MG/ML IV SOLN
1000.0000 mg | Freq: Once | INTRAVENOUS | Status: AC
  Administered 2012-07-16: 1000 mg via INTRAVENOUS
  Filled 2012-07-16: qty 100

## 2012-07-16 MED ORDER — LACTATED RINGERS IV SOLN
INTRAVENOUS | Status: DC | PRN
  Administered 2012-07-16: 07:00:00 via INTRAVENOUS

## 2012-07-16 MED ORDER — SODIUM CHLORIDE 0.9 % IJ SOLN
3.0000 mL | Freq: Two times a day (BID) | INTRAMUSCULAR | Status: DC
  Administered 2012-07-17 – 2012-07-19 (×6): 3 mL via INTRAVENOUS

## 2012-07-16 MED ORDER — ALBUMIN HUMAN 5 % IV SOLN
INTRAVENOUS | Status: DC | PRN
  Administered 2012-07-16: 14:00:00 via INTRAVENOUS

## 2012-07-16 MED ORDER — METOPROLOL TARTRATE 1 MG/ML IV SOLN: 2.5000 mg | INTRAVENOUS | Status: DC | PRN

## 2012-07-16 MED ORDER — INSULIN REGULAR BOLUS VIA INFUSION
0.0000 [IU] | Freq: Three times a day (TID) | INTRAVENOUS | Status: DC
  Filled 2012-07-16: qty 10

## 2012-07-16 MED ORDER — MIDAZOLAM HCL 5 MG/5ML IJ SOLN
INTRAMUSCULAR | Status: DC | PRN
  Administered 2012-07-16: 2 mg via INTRAVENOUS
  Administered 2012-07-16: 5 mg via INTRAVENOUS
  Administered 2012-07-16: 3 mg via INTRAVENOUS

## 2012-07-16 SURGICAL SUPPLY — 126 items
ADAPTER CARDIO PERF ANTE/RETRO (ADAPTER) ×5 IMPLANT
APPLIER CLIP 9.375 SM OPEN (CLIP) ×5
ATTRACTOMAT 16X20 MAGNETIC DRP (DRAPES) ×5 IMPLANT
BAG DECANTER FOR FLEXI CONT (MISCELLANEOUS) ×5 IMPLANT
BANDAGE ELASTIC 4 VELCRO ST LF (GAUZE/BANDAGES/DRESSINGS) ×15 IMPLANT
BANDAGE ELASTIC 6 VELCRO ST LF (GAUZE/BANDAGES/DRESSINGS) ×10 IMPLANT
BANDAGE GAUZE ELAST BULKY 4 IN (GAUZE/BANDAGES/DRESSINGS) ×5 IMPLANT
BASKET HEART  (ORDER IN 25'S) (MISCELLANEOUS) ×1
BASKET HEART (ORDER IN 25'S) (MISCELLANEOUS) ×1
BASKET HEART (ORDER IN 25S) (MISCELLANEOUS) ×3 IMPLANT
BLADE STERNUM SYSTEM 6 (BLADE) ×5 IMPLANT
BLADE SURG 11 STRL SS (BLADE) ×5 IMPLANT
BLADE SURG 12 STRL SS (BLADE) ×5 IMPLANT
BLADE SURG 15 STRL LF DISP TIS (BLADE) ×3 IMPLANT
BLADE SURG 15 STRL SS (BLADE) ×2
BLADE SURG ROTATE 9660 (MISCELLANEOUS) IMPLANT
CABLE PACING FASLOC BIEGE (MISCELLANEOUS) ×5 IMPLANT
CANISTER SUCTION 2500CC (MISCELLANEOUS) ×5 IMPLANT
CANNULA AORTIC HI-FLOW 6.5M20F (CANNULA) ×5 IMPLANT
CANNULA GUNDRY RCSP 15FR (MISCELLANEOUS) ×5 IMPLANT
CANNULA VENOUS MAL SGL STG 40 (MISCELLANEOUS) IMPLANT
CANNULAE VENOUS MAL SGL STG 40 (MISCELLANEOUS)
CATH CPB KIT VANTRIGT (MISCELLANEOUS) ×5 IMPLANT
CATH ROBINSON RED A/P 18FR (CATHETERS) ×15 IMPLANT
CATH THORACIC 28FR (CATHETERS) IMPLANT
CATH THORACIC 28FR RT ANG (CATHETERS) IMPLANT
CATH THORACIC 36FR (CATHETERS) IMPLANT
CATH THORACIC 36FR RT ANG (CATHETERS) ×10 IMPLANT
CLIP APPLIE 9.375 SM OPEN (CLIP) ×3 IMPLANT
CLIP TI MEDIUM 24 (CLIP) ×5 IMPLANT
CLIP TI WIDE RED SMALL 24 (CLIP) ×10 IMPLANT
CLOTH BEACON ORANGE TIMEOUT ST (SAFETY) ×10 IMPLANT
COVER MAYO STAND STRL (DRAPES) ×5 IMPLANT
COVER SURGICAL LIGHT HANDLE (MISCELLANEOUS) ×5 IMPLANT
CRADLE DONUT ADULT HEAD (MISCELLANEOUS) ×5 IMPLANT
DERMABOND ADVANCED (GAUZE/BANDAGES/DRESSINGS) ×2
DERMABOND ADVANCED .7 DNX12 (GAUZE/BANDAGES/DRESSINGS) ×3 IMPLANT
DRAIN CHANNEL 32F RND 10.7 FF (WOUND CARE) ×5 IMPLANT
DRAPE CARDIOVASCULAR INCISE (DRAPES) ×2
DRAPE EXTREMITY T 121X128X90 (DRAPE) ×5 IMPLANT
DRAPE PROXIMA HALF (DRAPES) IMPLANT
DRAPE SLUSH MACHINE 52X66 (DRAPES) IMPLANT
DRAPE SLUSH/WARMER DISC (DRAPES) IMPLANT
DRAPE SRG 135X102X78XABS (DRAPES) ×3 IMPLANT
DRSG COVADERM 4X10 (GAUZE/BANDAGES/DRESSINGS) ×5 IMPLANT
DRSG COVADERM 4X14 (GAUZE/BANDAGES/DRESSINGS) ×5 IMPLANT
ELECT BLADE 4.0 EZ CLEAN MEGAD (MISCELLANEOUS) ×5
ELECT BLADE 6.5 EXT (BLADE) ×5 IMPLANT
ELECT CAUTERY BLADE 6.4 (BLADE) ×5 IMPLANT
ELECT REM PT RETURN 9FT ADLT (ELECTROSURGICAL) ×10
ELECTRODE BLDE 4.0 EZ CLN MEGD (MISCELLANEOUS) ×3 IMPLANT
ELECTRODE REM PT RTRN 9FT ADLT (ELECTROSURGICAL) ×6 IMPLANT
GEL ULTRASOUND 20GR AQUASONIC (MISCELLANEOUS) ×5 IMPLANT
GLOVE BIO SURGEON STRL SZ 6.5 (GLOVE) ×12 IMPLANT
GLOVE BIO SURGEON STRL SZ7.5 (GLOVE) ×15 IMPLANT
GLOVE BIO SURGEON STRL SZ8 (GLOVE) ×5 IMPLANT
GLOVE BIO SURGEONS STRL SZ 6.5 (GLOVE) ×3
GLOVE BIOGEL PI IND STRL 7.0 (GLOVE) ×15 IMPLANT
GLOVE BIOGEL PI IND STRL 8 (GLOVE) ×3 IMPLANT
GLOVE BIOGEL PI INDICATOR 7.0 (GLOVE) ×10
GLOVE BIOGEL PI INDICATOR 8 (GLOVE) ×2
GOWN STRL NON-REIN LRG LVL3 (GOWN DISPOSABLE) ×20 IMPLANT
HARMONIC SHEARS 14CM COAG (MISCELLANEOUS) ×5 IMPLANT
HEMOSTAT POWDER SURGIFOAM 1G (HEMOSTASIS) ×15 IMPLANT
HEMOSTAT SURGICEL 2X14 (HEMOSTASIS) ×5 IMPLANT
INSERT FOGARTY XLG (MISCELLANEOUS) IMPLANT
KIT BASIN OR (CUSTOM PROCEDURE TRAY) ×5 IMPLANT
KIT ROOM TURNOVER OR (KITS) ×10 IMPLANT
KIT SUCTION CATH 14FR (SUCTIONS) ×5 IMPLANT
KIT VASOVIEW W/TROCAR VH 2000 (KITS) ×5 IMPLANT
LEAD PACING MYOCARDI (MISCELLANEOUS) ×15 IMPLANT
MARKER GRAFT CORONARY BYPASS (MISCELLANEOUS) ×15 IMPLANT
MARKER SKIN DUAL TIP RULER LAB (MISCELLANEOUS) ×5 IMPLANT
NS IRRIG 1000ML POUR BTL (IV SOLUTION) ×25 IMPLANT
PACK OPEN HEART (CUSTOM PROCEDURE TRAY) ×5 IMPLANT
PAD ARMBOARD 7.5X6 YLW CONV (MISCELLANEOUS) ×20 IMPLANT
PENCIL BUTTON HOLSTER BLD 10FT (ELECTRODE) ×5 IMPLANT
PUNCH AORTIC ROTATE 4.0MM (MISCELLANEOUS) IMPLANT
PUNCH AORTIC ROTATE 4.5MM 8IN (MISCELLANEOUS) IMPLANT
PUNCH AORTIC ROTATE 5MM 8IN (MISCELLANEOUS) IMPLANT
SET CARDIOPLEGIA MPS 5001102 (MISCELLANEOUS) ×5 IMPLANT
SPONGE GAUZE 4X4 12PLY (GAUZE/BANDAGES/DRESSINGS) ×20 IMPLANT
SPONGE LAP 18X18 X RAY DECT (DISPOSABLE) ×10 IMPLANT
STAPLER VISISTAT 35W (STAPLE) ×5 IMPLANT
STOPCOCK MORSE 400PSI 3WAY (MISCELLANEOUS) ×5 IMPLANT
SUT BONE WAX W31G (SUTURE) ×5 IMPLANT
SUT MNCRL AB 4-0 PS2 18 (SUTURE) ×10 IMPLANT
SUT PROLENE 3 0 SH DA (SUTURE) IMPLANT
SUT PROLENE 3 0 SH1 36 (SUTURE) IMPLANT
SUT PROLENE 4 0 RB 1 (SUTURE) ×2
SUT PROLENE 4 0 SH DA (SUTURE) ×5 IMPLANT
SUT PROLENE 4-0 RB1 .5 CRCL 36 (SUTURE) ×3 IMPLANT
SUT PROLENE 5 0 C 1 36 (SUTURE) IMPLANT
SUT PROLENE 6 0 C 1 30 (SUTURE) IMPLANT
SUT PROLENE 7 0 DA (SUTURE) IMPLANT
SUT PROLENE 7.0 RB 3 (SUTURE) ×15 IMPLANT
SUT PROLENE 8 0 BV175 6 (SUTURE) ×5 IMPLANT
SUT PROLENE BLUE 7 0 (SUTURE) ×5 IMPLANT
SUT SILK  1 MH (SUTURE)
SUT SILK 1 MH (SUTURE) IMPLANT
SUT SILK 2 0 SH CR/8 (SUTURE) IMPLANT
SUT SILK 3 0 SH CR/8 (SUTURE) IMPLANT
SUT STEEL 6MS V (SUTURE) ×10 IMPLANT
SUT STEEL STERNAL CCS#1 18IN (SUTURE) IMPLANT
SUT STEEL SZ 6 DBL 3X14 BALL (SUTURE) ×5 IMPLANT
SUT VIC AB 1 CTX 36 (SUTURE) ×4
SUT VIC AB 1 CTX36XBRD ANBCTR (SUTURE) ×6 IMPLANT
SUT VIC AB 2-0 CT1 27 (SUTURE) ×2
SUT VIC AB 2-0 CT1 TAPERPNT 27 (SUTURE) ×3 IMPLANT
SUT VIC AB 2-0 CTX 27 (SUTURE) IMPLANT
SUT VIC AB 3-0 SH 27 (SUTURE) ×4
SUT VIC AB 3-0 SH 27X BRD (SUTURE) ×6 IMPLANT
SUT VIC AB 3-0 X1 27 (SUTURE) ×5 IMPLANT
SUT VICRYL 4-0 PS2 18IN ABS (SUTURE) IMPLANT
SUTURE E-PAK OPEN HEART (SUTURE) ×5 IMPLANT
SYR 50ML SLIP (SYRINGE) IMPLANT
SYSTEM SAHARA CHEST DRAIN ATS (WOUND CARE) ×5 IMPLANT
TAPE CLOTH SURG 4X10 WHT LF (GAUZE/BANDAGES/DRESSINGS) ×5 IMPLANT
TOWEL OR 17X24 6PK STRL BLUE (TOWEL DISPOSABLE) ×10 IMPLANT
TOWEL OR 17X26 10 PK STRL BLUE (TOWEL DISPOSABLE) ×10 IMPLANT
TRAY CATH LUMEN 1 20CM STRL (SET/KITS/TRAYS/PACK) ×5 IMPLANT
TRAY FOLEY IC TEMP SENS 14FR (CATHETERS) ×5 IMPLANT
TUBING ART PRESS 48 MALE/FEM (TUBING) ×10 IMPLANT
TUBING INSUFFLATION 10FT LAP (TUBING) ×5 IMPLANT
UNDERPAD 30X30 INCONTINENT (UNDERPADS AND DIAPERS) ×10 IMPLANT
WATER STERILE IRR 1000ML POUR (IV SOLUTION) ×10 IMPLANT

## 2012-07-16 NOTE — Preoperative (Signed)
Beta Blockers   Reason not to administer Beta Blockers:Not Applicable 

## 2012-07-16 NOTE — Brief Op Note (Signed)
07/09/2012 - 07/16/2012  12:24 PM  PATIENT:  Logan [name]  37 y.o. male  PRE-OPERATIVE DIAGNOSIS:  CAD  POST-OPERATIVE DIAGNOSIS:  CAD  PROCEDURE:  I NTRAOPERATIVE TRANSESOPHAGEAL ECHOCARDIOGRAM ICORONARY ARTERY BYPASS GRAFTING (CABG) x 3 (LIMA to LAD, SVG to Circumflex, SVG to RCA) with EVH from BILATERAL THIGHS, RIGHT FEMORAL A LINE   SURGEON:  Surgeon(s) and Role:    * Kerin Perna, MD - Primary  PHYSICIAN ASSISTANT: Doree Fudge PA-c   ANESTHESIA:   general  EBL:  Total I/O In: 1000 [I.V.:1000] Out: 800 [Urine:800]   DRAINS:  Chest Tube(s) in the Mediastinal and pleural spaces   COUNTS CORRECT:  YES  DICTATION: .Dragon Dictation  PLAN OF CARE: Admit to inpatient   PATIENT DISPOSITION:  ICU - intubated and hemodynamically stable.   Delay start of Pharmacological VTE agent (>24hrs) due to surgical blood loss or risk of bleeding: yes  PRE OP WEIGHT: 66 kg

## 2012-07-16 NOTE — Anesthesia Preprocedure Evaluation (Addendum)
Anesthesia Evaluation  Patient identified by MRN, date of birth, ID band Patient awake    Reviewed: Allergy & Precautions, H&P , NPO status , Patient's Chart, lab work & pertinent test results, reviewed documented beta blocker date and time   History of Anesthesia Complications Negative for: history of anesthetic complications  Airway       Dental   Pulmonary COPD COPD inhaler, Current Smoker,  breath sounds clear to auscultation        Cardiovascular + angina + CAD, + Past MI and +CHF Rhythm:Regular Rate:Normal     Neuro/Psych negative neurological ROS  negative psych ROS   GI/Hepatic Neg liver ROS, GERD-  Medicated and Controlled,  Endo/Other  negative endocrine ROS  Renal/GU negative Renal ROS     Musculoskeletal negative musculoskeletal ROS (+)   Abdominal   Peds  Hematology negative hematology ROS (+)   Anesthesia Other Findings   Reproductive/Obstetrics negative OB ROS                          Anesthesia Physical Anesthesia Plan  ASA: IV  Anesthesia Plan: General   Post-op Pain Management:    Induction: Intravenous  Airway Management Planned: Oral ETT  Additional Equipment: Arterial line, PA Cath, TEE and Ultrasound Guidance Line Placement  Intra-op Plan:   Post-operative Plan: Post-operative intubation/ventilation  Informed Consent: I have reviewed the patients History and Physical, chart, labs and discussed the procedure including the risks, benefits and alternatives for the proposed anesthesia with the patient or authorized representative who has indicated his/her understanding and acceptance.     Plan Discussed with: CRNA and Surgeon  Anesthesia Plan Comments:        Anesthesia Quick Evaluation

## 2012-07-16 NOTE — Progress Notes (Signed)
  Echocardiogram Echocardiogram Transesophageal has been performed.  Logan [name] 07/16/2012, 10:42 AM

## 2012-07-16 NOTE — Procedures (Signed)
Extubation Procedure Note  Patient Details:   Name: Devansh Riese DOB: [birth year] MRN: [id number]   Airway Documentation:     Evaluation  O2 sats: stable throughout Complications: No apparent complications Patient did tolerate procedure well. Bilateral Breath Sounds: Rhonchi;Diminished   Yes  Pt achieved 1.7L VC, -40 NIF and was positive for cuff leak. Pt extubated to 3lpm [place], no strider heard. Pt resting comfortably at this time, vitals are within normal limits. RT will continue to monitor.   Beatris Si 07/16/2012, 6:37 PM

## 2012-07-16 NOTE — Progress Notes (Signed)
Day of Surgery Procedure(s) (LRB): CORONARY ARTERY BYPASS GRAFTING (CABG) (N/A) INTRAOPERATIVE TRANSESOPHAGEAL ECHOCARDIOGRAM (N/A) Subjective:  CABG for ischemic cardiomyopathy Extubated- stable hemodynamics Objective: Vital signs in last 24 hours: Temp:  [97.5 F (36.4 C)-99.5 F (37.5 C)] 99.3 F (37.4 C) (01/13 1915) Pulse Rate:  [58-91] 80  (01/13 1915) Cardiac Rhythm:  [-] Atrial paced (01/13 1700) Resp:  [12-23] 17  (01/13 1915) BP: (99-121)/(53-73) 107/53 mmHg (01/13 1810) SpO2:  [98 %-100 %] 100 % (01/13 1915) Arterial Line BP: (74-131)/(47-74) 75/47 mmHg (01/13 1915) FiO2 (%):  [40 %-50 %] 40 % (01/13 1730) Weight:  [144 lb 13.5 oz (65.7 kg)-145 lb (65.772 kg)] 144 lb 13.5 oz (65.7 kg) (01/13 1700)  Hemodynamic parameters for last 24 hours: PAP: (20-39)/(10-22) 20/11 mmHg CO:  [4.6 L/min] 4.6 L/min CI:  [2.3 L/min/m2-2.6 L/min/m2] 2.3 L/min/m2  Intake/Output from previous day: 01/12 0701 - 01/13 0700 In: 360 [P.O.:360] Out: 2000 [Urine:2000] Intake/Output this shift: Total I/O In: 50 [IV Piggyback:50] Out: -     Lab Results:  Basename 07/16/12 1545 07/16/12 1446 07/16/12 1225 07/16/12 0600  WBC 12.6* -- -- 7.2  HGB 11.5* 11.9* -- --  HCT 32.2* 35.0* -- --  PLT 124* -- 100* --   BMET:  Basename 07/16/12 1446 07/16/12 1320 07/16/12 0600 07/15/12 0600  NA 137 134* -- --  K 3.9 5.3* -- --  CL -- -- 100 102  CO2 -- -- 21 21  GLUCOSE 111* 103* -- --  BUN -- -- 9 10  CREATININE -- -- 0.77 0.69  CALCIUM -- -- 9.9 9.5    PT/INR:  Basename 07/16/12 1545  LABPROT 16.1*  INR 1.32   ABG    Component Value Date/Time   PHART 7.409 07/16/2012 1750   HCO3 23.0 07/16/2012 1750   TCO2 24 07/16/2012 1750   ACIDBASEDEF 1.0 07/16/2012 1750   O2SAT 99.0 07/16/2012 1750   CBG (last 3)   Basename 07/16/12 1850 07/16/12 1738  GLUCAP 129* 97    Assessment/Plan: S/P Procedure(s) (LRB): CORONARY ARTERY BYPASS GRAFTING (CABG) (N/A) INTRAOPERATIVE TRANSESOPHAGEAL  ECHOCARDIOGRAM (N/A) Cont postop care   LOS: 7 days    VAN TRIGT [name],Logan Selvy 07/16/2012

## 2012-07-16 NOTE — Anesthesia Postprocedure Evaluation (Signed)
  Anesthesia Post-op Note  Patient: Logan [name]  Procedure(s) Performed: Procedure(s) (LRB) with comments: CORONARY ARTERY BYPASS GRAFTING (CABG) (N/A) - Coronary Bypass Graft times three using left internal mammary artery and bilateral leg spahenous vein. Left arm radial artery exploration. INTRAOPERATIVE TRANSESOPHAGEAL ECHOCARDIOGRAM (N/A)  Patient Location: PACU  Anesthesia Type:General  Level of Consciousness: sedated and unresponsive  Airway and Oxygen Therapy: Patient remains intubated per anesthesia plan  Post-op Pain: none  Post-op Assessment: Post-op Vital signs reviewed, Patient's Cardiovascular Status Stable and Respiratory Function Stable  Post-op Vital Signs: Reviewed and stable  Complications: No apparent anesthesia complications

## 2012-07-16 NOTE — Progress Notes (Signed)
The patient was examined and preop studies reviewed. There has been no change from the prior exam and the patient is ready for surgery.  Plan CABG on Logan [name] today

## 2012-07-16 NOTE — Transfer of Care (Signed)
Immediate Anesthesia Transfer of Care Note  Patient: Logan [name]  Procedure(s) Performed: Procedure(s) (LRB) with comments: CORONARY ARTERY BYPASS GRAFTING (CABG) (N/A) - Coronary Bypass Graft times three using left internal mammary artery and bilateral leg spahenous vein. Left arm radial artery exploration. INTRAOPERATIVE TRANSESOPHAGEAL ECHOCARDIOGRAM (N/A)  Patient Location: SICU  Anesthesia Type:General  Level of Consciousness: sedated and Patient remains intubated per anesthesia plan  Airway & Oxygen Therapy: Patient remains intubated per anesthesia plan and Patient placed on Ventilator (see vital sign flow sheet for setting)  Post-op Assessment: Report given to PACU RN and Post -op Vital signs reviewed and stable  Post vital signs: Reviewed and stable  Complications: No apparent anesthesia complications

## 2012-07-17 ENCOUNTER — Encounter (HOSPITAL_COMMUNITY): Payer: Self-pay | Admitting: Cardiothoracic Surgery

## 2012-07-17 ENCOUNTER — Inpatient Hospital Stay (HOSPITAL_COMMUNITY): Payer: Medicaid Other

## 2012-07-17 LAB — CREATININE, SERUM: Creatinine, Ser: 0.64 mg/dL (ref 0.50–1.35)

## 2012-07-17 LAB — POCT I-STAT, CHEM 8
BUN: 11 mg/dL (ref 6–23)
Calcium, Ion: 1.17 mmol/L (ref 1.12–1.23)
Chloride: 100 mEq/L (ref 96–112)
Glucose, Bld: 107 mg/dL — ABNORMAL HIGH (ref 70–99)
TCO2: 25 mmol/L (ref 0–100)

## 2012-07-17 LAB — CBC
Hemoglobin: 9.8 g/dL — ABNORMAL LOW (ref 13.0–17.0)
MCH: 33.8 pg (ref 26.0–34.0)
MCH: 34 pg (ref 26.0–34.0)
MCHC: 35.9 g/dL (ref 30.0–36.0)
MCV: 95.3 fL (ref 78.0–100.0)
Platelets: 141 10*3/uL — ABNORMAL LOW (ref 150–400)
RBC: 2.96 MIL/uL — ABNORMAL LOW (ref 4.22–5.81)
RDW: 12.7 % (ref 11.5–15.5)
RDW: 12.7 % (ref 11.5–15.5)
WBC: 12 10*3/uL — ABNORMAL HIGH (ref 4.0–10.5)

## 2012-07-17 LAB — BASIC METABOLIC PANEL
CO2: 24 mEq/L (ref 19–32)
Calcium: 8.7 mg/dL (ref 8.4–10.5)
Creatinine, Ser: 0.74 mg/dL (ref 0.50–1.35)
GFR calc Af Amer: >90 mL/min (ref >90)
GFR calc non Af Amer: >90 mL/min (ref >90)
Sodium: 136 mEq/L (ref 135–145)

## 2012-07-17 LAB — GLUCOSE, CAPILLARY
Glucose-Capillary: 116 mg/dL — ABNORMAL HIGH (ref 70–99)
Glucose-Capillary: 100 mg/dL — ABNORMAL HIGH (ref 70–99)
Glucose-Capillary: 105 mg/dL — ABNORMAL HIGH (ref 70–99)
Glucose-Capillary: 109 mg/dL — ABNORMAL HIGH (ref 70–99)
Glucose-Capillary: 93 mg/dL (ref 70–99)
Glucose-Capillary: 111 mg/dL — ABNORMAL HIGH (ref 70–99)

## 2012-07-17 LAB — MAGNESIUM: Magnesium: 2.1 mg/dL (ref 1.5–2.5)

## 2012-07-17 MED ORDER — KETOROLAC TROMETHAMINE 15 MG/ML IJ SOLN
15.0000 mg | Freq: Four times a day (QID) | INTRAMUSCULAR | Status: AC
  Administered 2012-07-17 (×2): 15 mg via INTRAVENOUS
  Filled 2012-07-17: qty 1

## 2012-07-17 MED ORDER — INSULIN DETEMIR 100 UNIT/ML ~~LOC~~ SOLN
18.0000 [IU] | Freq: Every day | SUBCUTANEOUS | Status: DC
  Administered 2012-07-17: 18 [IU] via SUBCUTANEOUS
  Filled 2012-07-17: qty 10

## 2012-07-17 MED ORDER — FUROSEMIDE 10 MG/ML IJ SOLN
20.0000 mg | Freq: Two times a day (BID) | INTRAMUSCULAR | Status: DC
  Administered 2012-07-17: 20 mg via INTRAVENOUS
  Filled 2012-07-17 (×2): qty 2

## 2012-07-17 MED ORDER — INSULIN ASPART 100 UNIT/ML ~~LOC~~ SOLN: 0.0000 [IU] | SUBCUTANEOUS | Status: DC

## 2012-07-17 MED ORDER — TRAMADOL HCL 50 MG PO TABS
100.0000 mg | ORAL_TABLET | Freq: Four times a day (QID) | ORAL | Status: DC | PRN
  Administered 2012-07-17 – 2012-07-19 (×4): 100 mg via ORAL
  Filled 2012-07-17 (×4): qty 2

## 2012-07-17 MED ORDER — KETOROLAC TROMETHAMINE 15 MG/ML IJ SOLN
INTRAMUSCULAR | Status: AC
  Filled 2012-07-17: qty 1

## 2012-07-17 MED ORDER — LEVALBUTEROL HCL 1.25 MG/0.5ML IN NEBU
1.2500 mg | INHALATION_SOLUTION | Freq: Four times a day (QID) | RESPIRATORY_TRACT | Status: DC | PRN
  Filled 2012-07-17: qty 0.5

## 2012-07-17 MED ORDER — VANCOMYCIN HCL IN DEXTROSE 1-5 GM/200ML-% IV SOLN
1000.0000 mg | Freq: Two times a day (BID) | INTRAVENOUS | Status: AC
  Administered 2012-07-17 (×2): 1000 mg via INTRAVENOUS
  Filled 2012-07-17 (×2): qty 200

## 2012-07-17 MED ORDER — INSULIN ASPART 100 UNIT/ML ~~LOC~~ SOLN: 0.0000 [IU] | SUBCUTANEOUS | Status: AC

## 2012-07-17 MED ORDER — INSULIN ASPART 100 UNIT/ML ~~LOC~~ SOLN: 3.0000 [IU] | Freq: Three times a day (TID) | SUBCUTANEOUS | Status: DC

## 2012-07-17 MED FILL — Dexmedetomidine HCl IV Soln 200 MCG/2ML: INTRAVENOUS | Qty: 2 | Status: AC

## 2012-07-17 MED FILL — Potassium Chloride Inj 2 mEq/ML: INTRAVENOUS | Qty: 40 | Status: AC

## 2012-07-17 MED FILL — Magnesium Sulfate Inj 50%: INTRAMUSCULAR | Qty: 10 | Status: AC

## 2012-07-17 NOTE — Progress Notes (Signed)
Patient ID: Logan [name], male   DOB: [birth year], 37 y.o.   MRN: [id number]                   [address]            [place] [address]          [phone]     1 Day Post-Op Procedure(s) (LRB): CORONARY ARTERY BYPASS GRAFTING (CABG) (N/A) INTRAOPERATIVE TRANSESOPHAGEAL ECHOCARDIOGRAM (N/A)  Total Length of Stay:  LOS: 8 days  BP 101/53  Pulse 79  Temp 98.1 F (36.7 C) (Oral)  Resp 13  Ht 5\' 8"  (1.727 m)  Wt 154 lb 12.2 oz (70.2 kg)  BMI 23.53 kg/m2  SpO2 97%  .Intake/Output      01/13 0701 - 01/14 0700 01/14 0701 - 01/15 0700   P.O.     I.V. (mL/kg) 4789.1 (68.2) 373.8 (5.3)   Blood 510    IV Piggyback 1006 352   Total Intake(mL/kg) 6305.1 (89.8) 725.8 (10.3)   Urine (mL/kg/hr) 5245 (3.1) 290 (0.4)   Blood 1785    Chest Tube 520 80   Total Output 7550 370   Net -1244.9 +355.8             . sodium chloride 20 mL (07/16/12 1500)  . sodium chloride    . DOPamine 3 mcg/kg/min (07/17/12 1200)  . insulin (NOVOLIN-R) infusion Stopped (07/17/12 0800)  . phenylephrine (NEO-SYNEPHRINE) Adult infusion 10 mcg/min (07/17/12 1500)     Lab Results  Component Value Date   WBC 10.8* 07/17/2012   HGB 9.2* 07/17/2012   HCT 27.0* 07/17/2012   PLT 133* 07/17/2012   GLUCOSE 107* 07/17/2012   CHOL 183 01/23/2012   TRIG 87 01/23/2012   HDL 29* 01/23/2012   LDLCALC 137* 01/23/2012   NA 136 07/17/2012   K 3.7 07/17/2012   CL 100 07/17/2012   CREATININE 0.70 07/17/2012   BUN 11 07/17/2012   CO2 24 07/17/2012   TSH 1.212 07/11/2012   INR 1.32 07/16/2012   HGBA1C 5.2 07/11/2012  stable day    Delight Ovens MD  Beeper [phone] Office [phone] 07/17/2012 6:18 PM

## 2012-07-17 NOTE — Progress Notes (Signed)
1 Day Post-Op Procedure(s) (LRB): CORONARY ARTERY BYPASS GRAFTING (CABG) (N/A) INTRAOPERATIVE TRANSESOPHAGEAL ECHOCARDIOGRAM (N/A) Subjective: CABGx3 for ACS, ischemic cardiomyopathy, DM Doing well on low dose dopamine   Objective: Vital signs in last 24 hours: Temp:  [97.5 F (36.4 C)-99.5 F (37.5 C)] 98.2 F (36.8 C) (01/14 0800) Pulse Rate:  [79-91] 82  (01/14 1000) Cardiac Rhythm:  [-] Atrial paced (01/14 0800) Resp:  [8-24] 13  (01/14 1000) BP: (81-121)/(44-63) 115/46 mmHg (01/14 1000) SpO2:  [98 %-100 %] 98 % (01/14 1000) Arterial Line BP: (74-131)/(45-74) 122/52 mmHg (01/14 1000) FiO2 (%):  [40 %-50 %] 40 % (01/13 1730) Weight:  [144 lb 13.5 oz (65.7 kg)-154 lb 12.2 oz (70.2 kg)] 154 lb 12.2 oz (70.2 kg) (01/14 0500)  Hemodynamic parameters for last 24 hours: PAP: (20-39)/(10-22) 31/10 mmHg CO:  [4.3 L/min-6.8 L/min] 6.8 L/min CI:  [2.3 L/min/m2-3.8 L/min/m2] 3.8 L/min/m2  Intake/Output from previous day: 01/13 0701 - 01/14 0700 In: 6305.1 [I.V.:4789.1; Blood:510; IV Piggyback:1006] Out: 7550 [Urine:5245; Blood:1785; Chest Tube:520] Intake/Output this shift: Total I/O In: 459.3 [I.V.:109.3; IV Piggyback:350] Out: 195 [Urine:115; Chest Tube:80]  EXAM Lungs clear extrem warm  :  Basename 07/17/12 0339 07/16/12 2025  WBC 12.0* 15.6*  HGB 10.0* 11.4*11.2*  HCT 28.2* 32.2*33.0*  PLT 141* 147*   BMET:  Basename 07/17/12 0339 07/16/12 2025 07/16/12 0600  NA 136 140 --  K 4.2 5.2* --  CL 104 106 --  CO2 24 -- 21  GLUCOSE 105* 134* --  BUN 10 8 --  CREATININE 0.74 0.740.80 --  CALCIUM 8.7 -- 9.9    PT/INR:  Basename 07/16/12 1545  LABPROT 16.1*  INR 1.32   ABG    Component Value Date/Time   PHART 7.335* 07/16/2012 2021   HCO3 23.0 07/16/2012 2021   TCO2 23 07/16/2012 2025   ACIDBASEDEF 3.0* 07/16/2012 2021   O2SAT 99.0 07/16/2012 2021   CBG (last 3)   Basename 07/17/12 0750 07/17/12 0650 07/17/12 0527  GLUCAP 96 93 105*     Assessment/Plan: S/P Procedure(s) (LRB): CORONARY ARTERY BYPASS GRAFTING (CABG) (N/A) INTRAOPERATIVE TRANSESOPHAGEAL ECHOCARDIOGRAM (N/A) See progression orders Cont renal dopamine 24hr due to low ef  LOS: 8 days    VAN TRIGT III,PETER 07/17/2012

## 2012-07-17 NOTE — Progress Notes (Signed)
1 Day Post-Op Procedure(s) (LRB): CORONARY ARTERY BYPASS GRAFTING (CABG) (N/A) INTRAOPERATIVE TRANSESOPHAGEAL ECHOCARDIOGRAM (N/A) Subjective:  Mr. Scioneaux complains of pain this morning, but states its controlled with pain medication.   Objective: Vital signs in last 24 hours: Temp:  [97.5 F (36.4 C)-99.5 F (37.5 C)] 98.4 F (36.9 C) (01/14 0700) Pulse Rate:  [79-91] 80  (01/14 0700) Cardiac Rhythm:  [-] Atrial paced (01/14 0600) Resp:  [8-24] 9  (01/14 0700) BP: (81-121)/(45-63) 81/45 mmHg (01/14 0700) SpO2:  [98 %-100 %] 99 % (01/14 0700) Arterial Line BP: (74-131)/(45-74) 89/45 mmHg (01/14 0700) FiO2 (%):  [40 %-50 %] 40 % (01/13 1730) Weight:  [144 lb 13.5 oz (65.7 kg)-154 lb 12.2 oz (70.2 kg)] 154 lb 12.2 oz (70.2 kg) (01/14 0500)  Hemodynamic parameters for last 24 hours: PAP: (20-39)/(10-22) 26/11 mmHg CO:  [4.3 L/min-6.8 L/min] 6.8 L/min CI:  [2.3 L/min/m2-3.8 L/min/m2] 3.8 L/min/m2  Intake/Output from previous day: 01/13 0701 - 01/14 0700 In: 6305.1 [I.V.:4789.1; Blood:510; IV Piggyback:1006] Out: 7550 [Urine:5245; Blood:1785; Chest Tube:520]  General appearance: alert, cooperative and no distress Heart: regular rate and rhythm Lungs: clear to auscultation bilaterally Abdomen: soft, non-tender; bowel sounds normal; no masses,  no organomegaly Extremities: edema none Wound: clean and dry  Lab Results:  [facility] 07/17/12 0339 07/16/12 2025  WBC 12.0* 15.6*  HGB 10.0* 11.4*11.2*  HCT 28.2* 32.2*33.0*  PLT 141* 147*   BMET:  Basename 07/17/12 0339 07/16/12 2025 07/16/12 0600  NA 136 140 --  K 4.2 5.2* --  CL 104 106 --  CO2 24 -- 21  GLUCOSE 105* 134* --  BUN 10 8 --  CREATININE 0.74 0.740.80 --  CALCIUM 8.7 -- 9.9    PT/INR:  Basename 07/16/12 1545  LABPROT 16.1*  INR 1.32   ABG    Component Value Date/Time   PHART 7.335* 07/16/2012 2021   HCO3 23.0 07/16/2012 2021   TCO2 23 07/16/2012 2025   ACIDBASEDEF 3.0* 07/16/2012 2021   O2SAT 99.0  07/16/2012 2021   CBG (last 3)   Basename 07/17/12 0650 07/17/12 0527 07/17/12 0432  GLUCAP 93 105* 100*    Assessment/Plan: S/P Procedure(s) (LRB): CORONARY ARTERY BYPASS GRAFTING (CABG) (N/A) INTRAOPERATIVE TRANSESOPHAGEAL ECHOCARDIOGRAM (N/A)  1. CV- NSR remains on Milrinone, Lidocaine, Neo, Dopamine- will wean drips as tolerated 2. Resp- CXR looks good some atelectasis, no effusions- will d/c CT's encourage IS 3. Renal- creatinine stable- good urinary output, leave foley for accurate I/O monitoring 4. Volume Overload- not currently on lasix 5. CBGs- well controlled, patient not a diabetic 6. Dispo- wean drips as tolerated, d/c chest tubes, will remove a-line once weaned off drips, d/c foley in AM   LOS: 8 days    BARRETT, ERIN 07/17/2012

## 2012-07-17 NOTE — Progress Notes (Signed)
Patient complaining of constant, unrelieved chest pain on the left side.  Pain does not radiate and is more pressure than sharp.. Patient states "feels like it did when I had a heart attack".  Dr. Donata Clay called and orders received. EKG obtained and IV toradol given... Patient feels like pain is starting to ease up after toradol.

## 2012-07-17 NOTE — Op Note (Signed)
NAME:  Logan [name], Logan [name] NO.:  [id number]  MEDICAL RECORD NO.:  [id number]  LOCATION:  2308                         FACILITY:  [facility]  PHYSICIAN:  Kerin Perna, M.D.  DATE OF BIRTH:  1976/06/20  DATE OF PROCEDURE:  07/16/2012 DATE OF DISCHARGE:                              OPERATIVE REPORT   OPERATIONS: 1. Coronary artery bypass grafting x3 (left internal mammary artery to     LAD, saphenous vein graft to obtuse marginal, saphenous vein graft     to right coronary artery). 2. Exposure, but not harvest of left radial artery. 3. Endoscopic harvest of bilateral thigh greater saphenous vein. 4. Placement of right femoral A-line for blood pressure monitoring.  PREOPERATIVE DIAGNOSES: 1. Ischemic cardiomyopathy. 2. Unstable angina with three-vessel coronary artery disease. 3. Previous myocardial infarction, and previous percutaneous coronary     intervention of the circumflex in 2011.  POSTOPERATIVE DIAGNOSES: 1. Ischemic cardiomyopathy. 2. Unstable angina with three-vessel coronary artery disease. 3. Previous myocardial infarction, and previous percutaneous coronary     intervention of the circumflex in 2011.  SURGEON:  Kerin Perna, M.D.  ASSISTANT:  Doree Fudge, PA  ANESTHESIA:  General by Dr. Autumn Patty.  CLINICAL NOTE:  The patient is a 37 year old diabetic smoker with prior history of coronary artery disease, prior history of myocardial infarction,prior history of PCI to the circumflex in 2011 at [facility].  He presented to the hospital with symptoms of unstable angina.  A 2-D echo showed severe LV dysfunction with EF of 15- 20%.  Coronary arteriograms were performed demonstrating severe three- vessel disease, chronic occlusion of the right coronary, high-grade stenosis of the proximal LAD, and high-grade stenosis of the circumflex proximal to the previously placed stent.  The patient underwent a subsequent right  heart cath showing fairly normal pulmonary arteries and cardiac output when he was felt to be a surgical candidate.  On 2-D echo, he had mild mitral insufficiency.  I discussed multivessel coronary bypass grafting with the patient and the family including the indications, benefits, alternatives, and risks. I discussed the major aspects of surgery including location of the surgical incisions, use of general anesthesia and cardiopulmonary bypass, the location of the surgical incisions, and the planned harvested conduit including saphenous vein using the endo vein harvest technique, the left radial artery as a free graft in the left internal mammary artery as a pedicle graft.  I discussed with the patient risks of death, bleeding, stroke, MI, respiratory failure, renal failure, and death and he demonstrated his understanding and agreed to proceed with surgery under what I felt was an informed consent.  OPERATIVE FINDINGS: 1. Radial artery was felt to be a adequate conduit based on     preoperative Doppler showing an intact palm arch circulation when     the vessel was dissected, heparinized, and then clamped with a     vascular clamp of the left hand palmar pulse obliterated.  For that     reason, the vessel was left intact.  Did not use as a free radial     artery graft. 2. 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 3. No blood products required for the surgery. 4. Improved global LV function on echo following separation of     cardiopulmonary bypass after placement of grafts. 5. Mild mitral regurgitation by transesophageal echo, not treated     surgically.  OPERATIVE PROCEDURE:  The patient was brought to the operating room and placed supine on the operating table where general anesthesia was induced under invasive hemodynamic monitoring.  The chest, abdomen, and legs, and left arm were prepped and draped as  a sterile field.  A proper time-out was performed.  First, the vein was harvested endoscopically from both legs as the left radial artery was exposed, but not harvested due to inadequate circulation in the hand after occlusion of the vessel with a soft vascular bulldog.  The left arm incision was closed, and the arms tucked to the side.  A sternal incision was made.  The left internal mammary artery was harvested as a pedicle graft.  The area was somewhat small, but was infused with a verapamil nitroglycerin solution for vasospasm and this improved flow significantly.  The sternal retractor was placed and the pericardium was opened and suspended.  The aorta was inspected and found to have minimal calcification.  Pursestrings were placed in the ascending aorta and right atrium and heparin was administered.  After the ACT was documented as being therapeutic, the patient was cannulated and placed on cardiopulmonary bypass.  Cardioplegic catheters were placed for both antegrade aortic and retrograde coronary sinus cardioplegia.  The mammary artery and vein grafts were prepared for the distal anastomoses.  The coronary arteries were identified for grafting. The LAD, OM, and right coronary artery were felt to be good targets. The right coronary proximally was very calcified.  The RV marginal branch, perfusing the inferior wall was chosen as the target.  The distal posterolateral posterior descending vessels were small diffusely diseased and calcified.  The patient was cooled to 32 degrees.  The aortic crossclamp was applied and cardioplegic arrest was achieved using infusion of 800 mL of cold blood cardioplegia in split doses between the antegrade aortic and retrograde coronary sinus catheters. There was good cardioplegic arrest and septal temperature dropped less than 12 degrees.  Cardioplegia was delivered every 20 minutes.  The distal coronary anastomoses were then performed.  The first  distal anastomosis was the right coronary artery.  It was 1.5-mm vessel, and the 1.5-mm probe passed distally easily.  A reverse saphenous vein was sewn end-to-side with running 7-0 Prolene.  There was good flow through the graft.  The second distal anastomosis was through the OM branch of the left circumflex.  This had proximal 80% stenosis.  A reverse saphenous vein was sewn end-to-side with running 7-0 Prolene with good flow through graft.  Cardioplegia was redosed.  The third distal anastomosis was the distal third of the LAD.  It had severe calcifications proximally.  The left IMA pedicle was brought through an opening created in the left lateral pericardium, was brought down onto the LAD and sewn end-to-side with running 8-0 Prolene.  There was good flow through the anastomosis after briefly releasing the pedicle bulldog on the mammary artery.  The bulldog was reapplied and the metal and the pedicle secured epicardium with 6-0 Prolene.  Cardioplegia was redosed.  While the crossclamp was still in place, 2 proximal vein anastomoses were performed on the ascending aorta using a 4.5-mm punch running 7-0 Prolene.  Prior to tying down the final  proximal anastomosis, air was vented from the coronaries with a dose of retrograde warm blood cardioplegia.  The crossclamp was then removed.  The heart was cardioverted back for a regular rhythm.  The vein grafts were opened and de-aired, initiated good flow.  Hemostasis was documented at the proximal distal anastomoses.  The cardioplegia cannula was removed.  The patient was rewarmed and reperfused to 37 degrees. Temporary pacing wires were applied.  Biventricular wires were used due to the patient's severe LV dysfunction.  Low-dose dopamine and milrinone were started.  The lungs re-expanded.  The ventilator was resumed.  The patient was weaned off bypass without difficulty.  Echo showed improved global LV function, and mild mitral  regurgitation.  Protamine was administered without adverse reaction and the cannulas removed.  Hemostasis was achieved.  The superior pericardium was closed over the aorta.  Then, anterior mediastinal and left pleural chest tube were placed and brought out through separate incisions.  The sternum was closed with interrupted steel wire.  The incision was irrigated with warm saline.  The leg incisions were closed in a standard manner.  The pectoral fascia was closed in running #1 Vicryl.  The subcutaneous and skin layers were closed in running Vicryl.  Sterile dressings were applied.  Total heart lung bypass time was 120 minutes.     Kerin Perna, M.D.     PV/MEDQ  D:  07/16/2012  T:  07/17/2012  Job:  [id number]  cc:   Logan Buckles. Bensimhon, MD

## 2012-07-18 ENCOUNTER — Inpatient Hospital Stay (HOSPITAL_COMMUNITY): Payer: Medicaid Other

## 2012-07-18 LAB — BASIC METABOLIC PANEL
BUN: 11 mg/dL (ref 6–23)
CO2: 23 mEq/L (ref 19–32)
Calcium: 8.8 mg/dL (ref 8.4–10.5)
Chloride: 98 mEq/L (ref 96–112)
Creatinine, Ser: 0.7 mg/dL (ref 0.50–1.35)
GFR calc Af Amer: >90 mL/min (ref >90)
GFR calc non Af Amer: >90 mL/min (ref >90)
Glucose, Bld: 104 mg/dL — ABNORMAL HIGH (ref 70–99)
Potassium: 3.5 mEq/L (ref 3.5–5.1)
Sodium: 132 mEq/L — ABNORMAL LOW (ref 135–145)

## 2012-07-18 LAB — GLUCOSE, CAPILLARY
Glucose-Capillary: 103 mg/dL — ABNORMAL HIGH (ref 70–99)
Glucose-Capillary: 77 mg/dL (ref 70–99)

## 2012-07-18 LAB — CBC
HCT: 26.7 % — ABNORMAL LOW (ref 39.0–52.0)
Hemoglobin: 9.6 g/dL — ABNORMAL LOW (ref 13.0–17.0)
MCH: 34.2 pg — ABNORMAL HIGH (ref 26.0–34.0)
MCHC: 36 g/dL (ref 30.0–36.0)
MCV: 95 fL (ref 78.0–100.0)
Platelets: 128 10*3/uL — ABNORMAL LOW (ref 150–400)
RBC: 2.81 MIL/uL — ABNORMAL LOW (ref 4.22–5.81)
RDW: 12.6 % (ref 11.5–15.5)
WBC: 11.7 10*3/uL — ABNORMAL HIGH (ref 4.0–10.5)

## 2012-07-18 MED ORDER — SODIUM CHLORIDE 0.9 % IJ SOLN: 3.0000 mL | INTRAMUSCULAR | Status: DC | PRN

## 2012-07-18 MED ORDER — SODIUM CHLORIDE 0.9 % IV SOLN: 250.0000 mL | INTRAVENOUS | Status: DC | PRN

## 2012-07-18 MED ORDER — MOVING RIGHT ALONG BOOK
Freq: Once | Status: AC
  Administered 2012-07-18: 17:00:00
  Filled 2012-07-18: qty 1

## 2012-07-18 MED ORDER — FE FUMARATE-B12-VIT C-FA-IFC PO CAPS
1.0000 | ORAL_CAPSULE | Freq: Three times a day (TID) | ORAL | Status: DC
  Administered 2012-07-18 – 2012-07-21 (×9): 1 via ORAL
  Filled 2012-07-18 (×13): qty 1

## 2012-07-18 MED ORDER — INSULIN ASPART 100 UNIT/ML ~~LOC~~ SOLN: 0.0000 [IU] | Freq: Three times a day (TID) | SUBCUTANEOUS | Status: DC

## 2012-07-18 MED ORDER — FUROSEMIDE 40 MG PO TABS
40.0000 mg | ORAL_TABLET | Freq: Every day | ORAL | Status: DC
  Administered 2012-07-18 – 2012-07-21 (×4): 40 mg via ORAL
  Filled 2012-07-18 (×4): qty 1

## 2012-07-18 MED ORDER — POTASSIUM CHLORIDE 10 MEQ/50ML IV SOLN
INTRAVENOUS | Status: AC
  Administered 2012-07-18: 10 meq via INTRAVENOUS
  Filled 2012-07-18: qty 50

## 2012-07-18 MED ORDER — SODIUM CHLORIDE 0.9 % IJ SOLN
3.0000 mL | Freq: Two times a day (BID) | INTRAMUSCULAR | Status: DC
  Administered 2012-07-19 – 2012-07-20 (×3): 3 mL via INTRAVENOUS

## 2012-07-18 MED ORDER — BISACODYL 5 MG PO TBEC: 10.0000 mg | DELAYED_RELEASE_TABLET | Freq: Every day | ORAL | Status: DC | PRN

## 2012-07-18 MED ORDER — LISINOPRIL 5 MG PO TABS
5.0000 mg | ORAL_TABLET | Freq: Every day | ORAL | Status: DC
  Filled 2012-07-18: qty 1

## 2012-07-18 MED ORDER — POTASSIUM CHLORIDE CRYS ER 20 MEQ PO TBCR
20.0000 meq | EXTENDED_RELEASE_TABLET | Freq: Two times a day (BID) | ORAL | Status: DC
  Administered 2012-07-18 – 2012-07-21 (×6): 20 meq via ORAL
  Filled 2012-07-18 (×8): qty 1

## 2012-07-18 MED ORDER — BUDESONIDE-FORMOTEROL FUMARATE 160-4.5 MCG/ACT IN AERO: 2.0000 | INHALATION_SPRAY | Freq: Two times a day (BID) | RESPIRATORY_TRACT | Status: DC

## 2012-07-18 MED ORDER — POTASSIUM CHLORIDE 10 MEQ/50ML IV SOLN
10.0000 meq | INTRAVENOUS | Status: AC | PRN
  Administered 2012-07-18 (×3): 10 meq via INTRAVENOUS

## 2012-07-18 MED ORDER — DIGOXIN 125 MCG PO TABS
0.1250 mg | ORAL_TABLET | Freq: Every day | ORAL | Status: DC
  Administered 2012-07-18 – 2012-07-20 (×3): 0.125 mg via ORAL
  Filled 2012-07-18 (×4): qty 1

## 2012-07-18 MED ORDER — BISACODYL 10 MG RE SUPP: 10.0000 mg | Freq: Every day | RECTAL | Status: DC | PRN

## 2012-07-18 MED ORDER — CARVEDILOL 6.25 MG PO TABS
6.2500 mg | ORAL_TABLET | Freq: Two times a day (BID) | ORAL | Status: DC
  Administered 2012-07-18: 6.25 mg via ORAL
  Filled 2012-07-18 (×4): qty 1

## 2012-07-18 MED ORDER — MAGNESIUM HYDROXIDE 400 MG/5ML PO SUSP: 30.0000 mL | Freq: Every day | ORAL | Status: DC | PRN

## 2012-07-18 MED FILL — Heparin Sodium (Porcine) Inj 1000 Unit/ML: INTRAMUSCULAR | Qty: 10 | Status: AC

## 2012-07-18 MED FILL — Heparin Sodium (Porcine) Inj 1000 Unit/ML: INTRAMUSCULAR | Qty: 30 | Status: AC

## 2012-07-18 MED FILL — Lidocaine HCl IV Inj 20 MG/ML: INTRAVENOUS | Qty: 10 | Status: AC

## 2012-07-18 MED FILL — Sodium Chloride IV Soln 0.9%: INTRAVENOUS | Qty: 1000 | Status: AC

## 2012-07-18 MED FILL — Mannitol IV Soln 20%: INTRAVENOUS | Qty: 500 | Status: AC

## 2012-07-18 MED FILL — Sodium Chloride Irrigation Soln 0.9%: Qty: 500 | Status: AC

## 2012-07-18 MED FILL — Electrolyte-R (PH 7.4) Solution: INTRAVENOUS | Qty: 5000 | Status: AC

## 2012-07-18 MED FILL — Sodium Bicarbonate IV Soln 8.4%: INTRAVENOUS | Qty: 50 | Status: AC

## 2012-07-18 MED FILL — Calcium Chloride Inj 10%: INTRAVENOUS | Qty: 10 | Status: AC

## 2012-07-18 NOTE — Progress Notes (Signed)
Advanced Heart Failure Rounding Note   Subjective:    POD #2 CABG. Doing well. Sitting in chair. Has ambulated several times. Weaning dopamine. In NSR. Being diuresed  Objective:   Weight Range:  Vital Signs:   Temp:  [98.1 F (36.7 C)-98.3 F (36.8 C)] 98.3 F (36.8 C) (01/15 1617) Pulse Rate:  [72-82] 75  (01/15 0900) BP: (94-117)/(38-69) 117/68 mmHg (01/15 1615) SpO2:  [91 %-97 %] 92 % (01/15 1615) Arterial Line BP: (108-134)/(42-56) 134/50 mmHg (01/15 0700) Weight:  [70.6 kg (155 lb 10.3 oz)] 70.6 kg (155 lb 10.3 oz) (01/15 0500) Last BM Date: 07/15/12  Weight change: Filed Weights   07/16/12 1700 07/17/12 0500 07/18/12 0500  Weight: 65.7 kg (144 lb 13.5 oz) 70.2 kg (154 lb 12.2 oz) 70.6 kg (155 lb 10.3 oz)    Intake/Output:   Intake/Output Summary (Last 24 hours) at 07/18/12 1902 Last data filed at 07/18/12 0900  Gross per 24 hour  Intake 1044.1 ml  Output    715 ml  Net  329.1 ml     Physical Exam: General:  Sitting in chair No resp difficulty HEENT: normal Neck: supple. JVP up Cor: Bandages in place. Regular rate & rhythm. Lungs: clear anteriorly. Abdomen: soft, nontender, nondistended. No hepatosplenomegaly. No bruits or masses. Good bowel sounds. Extremities: no cyanosis, clubbing, rash, 1+ edema. Neuro: alert & orientedx3, cranial nerves grossly intact. moves all 4 extremities w/o difficulty. Affect pleasant  Telemetry: sinus rhythm  Labs: Basic Metabolic Panel:  Lab 07/18/12 4098 07/17/12 1710 07/17/12 1700 07/17/12 0339 07/16/12 2025 07/16/12 1446 07/16/12 0600 07/15/12 0600 07/13/12 0845  NA 132* 136 -- 136 140 137 -- -- --  K 3.5 3.7 -- 4.2 5.2* 3.9 -- -- --  CL 98 100 -- 104 106 -- 100 -- --  CO2 23 -- -- 24 -- -- 21 21 25   GLUCOSE 104* 107* -- 105* 134* 111* -- -- --  BUN 11 11 -- 10 8 -- 9 -- --  CREATININE 0.70 0.70 0.64 0.74 0.740.80 -- -- -- --  CALCIUM 8.8 -- -- 8.7 -- -- 9.9 -- --  MG -- -- 1.7 2.1 2.7* -- -- -- --  PHOS -- -- --  -- -- -- -- -- --    Liver Function Tests: No results found for this basename: AST:5,ALT:5,ALKPHOS:5,BILITOT:5,PROT:5,ALBUMIN:5 in the last 168 hours No results found for this basename: LIPASE:5,AMYLASE:5 in the last 168 hours No results found for this basename: AMMONIA:3 in the last 168 hours  CBC:  Lab 07/18/12 0327 07/17/12 1710 07/17/12 1700 07/17/12 0339 07/16/12 2025 07/16/12 1545  WBC 11.7* -- 10.8* 12.0* 15.6* 12.6*  NEUTROABS -- -- -- -- -- --  HGB 9.6* 9.2* 9.8* 10.0* 11.4*11.2* --  HCT 26.7* 27.0* 27.3* 28.2* 32.2*33.0* --  MCV 95.0 -- 94.8 95.3 95.3 95.3  PLT 128* -- 133* 141* 147* 124*    Cardiac Enzymes: No results found for this basename: CKTOTAL:5,CKMB:5,CKMBINDEX:5,TROPONINI:5 in the last 168 hours  BNP: BNP (last 3 results) No results found for this basename: PROBNP:3 in the last 8760 hours    Imaging: Dg Chest Portable 1 View In Am  07/18/2012  *RADIOLOGY REPORT*  Clinical Data: Status post CABG surgery  PORTABLE CHEST - 1 VIEW  Comparison: 07/17/2012  Findings: Opacity at the left lung base obscures the left hemidiaphragm.  This is likely postoperative atelectasis.  The lungs are otherwise clear.  No pleural effusion.  Changes from the recent CABG surgery are stable.  No mediastinal  widening.  No pneumothorax.  Right internal jugular central venous line has its tip in the lower superior vena cava, unchanged.  All other lines and tubes have been removed.  IMPRESSION: Left lung base opacity, likely atelectasis.  No pulmonary edema, pneumothorax or evidence of an operative complication.   Original Report Authenticated By: Amie Portland, M.D.    Dg Chest Portable 1 View In Am  07/17/2012  *RADIOLOGY REPORT*  Clinical Data: CABG.  PORTABLE CHEST - 1 VIEW  Comparison: 07/16/2012  Findings: Interval extubation and removal of NG tube.  Left chest tube and Swan-Ganz catheter remain in place, unchanged.  No pneumothorax.  Cardiomegaly with vascular congestion.  Left base  atelectasis.  Right lung is clear.  No visible effusions.  IMPRESSION: Interval extubation.  Increasing left base atelectasis.  No pneumothorax.   Original Report Authenticated By: Charlett Nose, M.D.      Medications:     Scheduled Medications:    . acetaminophen  1,000 mg Oral Q6H   Or  . acetaminophen (TYLENOL) oral liquid 160 mg/5 mL  975 mg Per Tube Q6H  . aspirin EC  325 mg Oral Daily  . atorvastatin  80 mg Oral q1800  . bisacodyl  10 mg Oral Daily   Or  . bisacodyl  10 mg Rectal Daily  . budesonide-formoterol  2 puff Inhalation BID  . carvedilol  6.25 mg Oral BID WC  . digoxin  0.125 mg Oral Daily  . docusate sodium  200 mg Oral Daily  . ferrous fumarate-b12-vitamic C-folic acid  1 capsule Oral TID PC  . furosemide  40 mg Oral Daily  . insulin aspart  0-24 Units Subcutaneous TID AC & HS  . insulin aspart  3 Units Subcutaneous TID WC  . insulin detemir  18 Units Subcutaneous Q1200  . lisinopril  5 mg Oral Daily  . moving right along book   Does not apply Once  . pantoprazole  40 mg Oral Daily  . potassium chloride  20 mEq Oral BID  . sodium chloride  3 mL Intravenous Q12H  . sodium chloride  3 mL Intravenous Q12H    Infusions:    . sodium chloride    . insulin (NOVOLIN-R) infusion Stopped (07/17/12 0800)    PRN Medications: sodium chloride, sodium chloride, bisacodyl, bisacodyl, levalbuterol, magnesium hydroxide, metoprolol, morphine injection, ondansetron (ZOFRAN) IV, oxyCODONE, promethazine, sodium chloride, sodium chloride, traMADol   Assessment:   1. Unstable angina 2. 3v CAD with recurrent stent restenosis at LAD site     S/p CABG 07/18/11 3. 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 NSVT s/p Lifevest 5. H/o ETOH abuse quit 7/13 6. Tobacco abuse  Quit 07/04/12 7. H/o LV thrombus  Plan/Discussion:    Doing very well post CABG. Agree with diuresis.   Appreciate Dr. Zenaida Niece Trigt's care.   Length of Stay:  9   Estera Ozier,MD 7:02 PM

## 2012-07-18 NOTE — Progress Notes (Signed)
2 Days Post-Op Procedure(s) (LRB): CORONARY ARTERY BYPASS GRAFTING (CABG) (N/A) INTRAOPERATIVE TRANSESOPHAGEAL ECHOCARDIOGRAM (N/A) Subjective: Doing well postop CABG for ischemic cm nsr Weaning renal dopamine CBG's well controlled  Objective: Vital signs in last 24 hours: Temp:  [98.1 F (36.7 C)-98.2 F (36.8 C)] 98.2 F (36.8 C) (01/15 0757) Pulse Rate:  [70-85] 82  (01/15 0700) Cardiac Rhythm:  [-] Normal sinus rhythm (01/15 0400) Resp:  [13-21] 13  (01/14 1000) BP: (87-133)/(38-69) 115/38 mmHg (01/15 0700) SpO2:  [91 %-100 %] 94 % (01/15 0700) Arterial Line BP: (94-134)/(42-61) 134/50 mmHg (01/15 0700) Weight:  [155 lb 10.3 oz (70.6 kg)] 155 lb 10.3 oz (70.6 kg) (01/15 0500)  Hemodynamic parameters for last 24 hours: PAP: (27-36)/(9-16) 27/9 mmHg  Intake/Output from previous day: 01/14 0701 - 01/15 0700 In: 2001 [P.O.:540; I.V.:753; IV Piggyback:708] Out: 1375 [Urine:1295; Chest Tube:80] Intake/Output this shift:    Lungs clear extrem warm abd soft  Lab Results:  Basename 07/18/12 0327 07/17/12 1710 07/17/12 1700  WBC 11.7* -- 10.8*  HGB 9.6* 9.2* --  HCT 26.7* 27.0* --  PLT 128* -- 133*   BMET:  Basename 07/18/12 0327 07/17/12 1710 07/17/12 0339  NA 132* 136 --  K 3.5 3.7 --  CL 98 100 --  CO2 23 -- 24  GLUCOSE 104* 107* --  BUN 11 11 --  CREATININE 0.70 0.70 --  CALCIUM 8.8 -- 8.7    PT/INR:  Basename 07/16/12 1545  LABPROT 16.1*  INR 1.32   ABG    Component Value Date/Time   PHART 7.335* 07/16/2012 2021   HCO3 23.0 07/16/2012 2021   TCO2 25 07/17/2012 1710   ACIDBASEDEF 3.0* 07/16/2012 2021   O2SAT 99.0 07/16/2012 2021   CBG (last 3)   Basename 07/18/12 0755 07/18/12 0323 07/17/12 2347  GLUCAP 76 96 103*    Assessment/Plan: S/P Procedure(s) (LRB): CORONARY ARTERY BYPASS GRAFTING (CABG) (N/A) INTRAOPERATIVE TRANSESOPHAGEAL ECHOCARDIOGRAM (N/A) Transfer to stepdown No coumadin planned with nsr Smoking cessation   LOS: 9 days     VAN TRIGT III,PETER 07/18/2012

## 2012-07-18 NOTE — Progress Notes (Signed)
Pt had a run of SVT, asymptomatic, no complaints of pain, VS stable, pt resting in bed. Will continue to monitor.

## 2012-07-19 ENCOUNTER — Inpatient Hospital Stay (HOSPITAL_COMMUNITY): Payer: Medicaid Other

## 2012-07-19 LAB — CBC
HCT: 25.6 % — ABNORMAL LOW (ref 39.0–52.0)
Hemoglobin: 9.3 g/dL — ABNORMAL LOW (ref 13.0–17.0)
MCH: 34.4 pg — ABNORMAL HIGH (ref 26.0–34.0)
MCHC: 36.3 g/dL — ABNORMAL HIGH (ref 30.0–36.0)
MCV: 94.8 fL (ref 78.0–100.0)
Platelets: 158 10*3/uL (ref 150–400)
RBC: 2.7 MIL/uL — ABNORMAL LOW (ref 4.22–5.81)
RDW: 12.9 % (ref 11.5–15.5)
WBC: 12 10*3/uL — ABNORMAL HIGH (ref 4.0–10.5)

## 2012-07-19 LAB — BASIC METABOLIC PANEL
BUN: 14 mg/dL (ref 6–23)
CO2: 25 mEq/L (ref 19–32)
Calcium: 9.4 mg/dL (ref 8.4–10.5)
Chloride: 97 mEq/L (ref 96–112)
Creatinine, Ser: 0.82 mg/dL (ref 0.50–1.35)
GFR calc Af Amer: >90 mL/min (ref >90)
GFR calc non Af Amer: >90 mL/min (ref >90)
Glucose, Bld: 97 mg/dL (ref 70–99)
Potassium: 4 mEq/L (ref 3.5–5.1)
Sodium: 134 mEq/L — ABNORMAL LOW (ref 135–145)

## 2012-07-19 LAB — GLUCOSE, CAPILLARY: Glucose-Capillary: 94 mg/dL (ref 70–99)

## 2012-07-19 MED ORDER — CARVEDILOL 3.125 MG PO TABS
3.1250 mg | ORAL_TABLET | Freq: Two times a day (BID) | ORAL | Status: DC
  Administered 2012-07-19 – 2012-07-20 (×2): 3.125 mg via ORAL
  Filled 2012-07-19 (×5): qty 1

## 2012-07-19 MED ORDER — LISINOPRIL 2.5 MG PO TABS
2.5000 mg | ORAL_TABLET | Freq: Every day | ORAL | Status: DC
  Filled 2012-07-19 (×2): qty 1

## 2012-07-19 NOTE — Progress Notes (Signed)
Patient transferred to 2020. Call bell near. Logan [name]

## 2012-07-19 NOTE — Progress Notes (Signed)
CARDIAC REHAB PHASE I   PRE:  Rate/Rhythm: 89 SR  BP:  Supine: 100/60  Sitting:   Standing:    SaO2:   MODE:  Ambulation: 550 ft   POST:  Rate/Rhythem: 102  BP:  Supine:   Sitting: 120/54  Standing:    SaO2: 94 RA 1445-1510 Assisted X 1 to ambulate with hand held assist. Gait steady VS stable. Pt denies any c/o with walking. Encouraged use of IS. Pt back to side of bed after walk with call light in reach.  Logan [name]

## 2012-07-19 NOTE — Progress Notes (Signed)
Patient ambulated 550 ft in hallway with minimal assist. Returned to room without incidence. Call bell near. Mamie Levers

## 2012-07-19 NOTE — Care Management Note (Unsigned)
    Page 1 of 1   07/19/2012     3:53:38 PM   CARE MANAGEMENT NOTE 07/19/2012  Patient:  Logan [name], Logan [name]   Account Number:  [id number]  Date Initiated:  07/19/2012  Documentation initiated by:  Shakita Keir  Subjective/Objective Assessment:   PT S/P CABG X 3 ON 07/16/12.  PTA, PT INDEPENDENT OF ADLS; LIVES WITH PARENTS.     Action/Plan:   PARENTS TO PROVIDE CARE AT DC.  WILL FOLLOW FOR HOME NEEDS AS PT PROGRESSES.   Anticipated DC Date:  07/21/2012   Anticipated DC Plan:  HOME W HOME HEALTH SERVICES      DC Planning Services  CM consult      Choice offered to / List presented to:             Status of service:  In process, will continue to follow Medicare Important Message given?   (If response is "NO", the following Medicare IM given date fields will be blank) Date Medicare IM given:   Date Additional Medicare IM given:    Discharge Disposition:    Per UR Regulation:  Reviewed for med. necessity/level of care/duration of stay  If discussed at Long Length of Stay Meetings, dates discussed:    Comments:

## 2012-07-19 NOTE — Progress Notes (Signed)
Right IJ central line d/c'd per order/protocol. Manual pressure held x 5 min. No bleeding or hematoma present. Patient instructed to remain in bed x1 hour post removal; verbalized understanding. Call bell near. Mamie Levers

## 2012-07-19 NOTE — Progress Notes (Addendum)
                   [address]            [name] [address]          [phone]      3 Days Post-Op Procedure(s) (LRB): CORONARY ARTERY BYPASS GRAFTING (CABG) (N/A) INTRAOPERATIVE TRANSESOPHAGEAL ECHOCARDIOGRAM (N/A)  Subjective: Patient without complaints this am. Passing flatus and no bowel movement yet.  Objective: Vital signs in last 24 hours: Temp:  [98.2 F (36.8 C)-99.3 F (37.4 C)] 99.3 F (37.4 C) (01/16 0527) Pulse Rate:  [75-84] 84  (01/16 0527) Cardiac Rhythm:  [-] Normal sinus rhythm (01/15 2100) Resp:  [18] 18  (01/16 0527) BP: (96-117)/(52-68) 99/57 mmHg (01/16 0527) SpO2:  [91 %-95 %] 92 % (01/16 0527) Weight:  [70.4 kg (155 lb 3.3 oz)] 70.4 kg (155 lb 3.3 oz) (01/16 0527)  Pre op weight  66 kg Current Weight  07/19/12 70.4 kg (155 lb 3.3 oz)      Intake/Output from previous day: 01/15 0701 - 01/16 0700 In: 283.7 [P.O.:240; I.V.:43.7] Out: 615 [Urine:615]   Physical Exam:  Cardiovascular: RRR, no murmurs, gallops, or rubs. Pulmonary: Clear to auscultation on right and slightly diminished at left base; no rales, wheezes, or rhonchi. Abdomen: Soft, non tender, bowel sounds present. Extremities: Mild bilateral lower extremity edema. Wounds: Clean and dry.  No erythema or signs of infection.  Lab Results: CBC: Basename 07/19/12 0531 07/18/12 0327  WBC 12.0* 11.7*  HGB 9.3* 9.6*  HCT 25.6* 26.7*  PLT 158 128*   BMET:  Basename 07/19/12 0531 07/18/12 0327  NA 134* 132*  K 4.0 3.5  CL 97 98  CO2 25 23  GLUCOSE 97 104*  BUN 14 11  CREATININE 0.82 0.70  CALCIUM 9.4 8.8    PT/INR:  Lab Results  Component Value Date   INR 1.32 07/16/2012   INR 1.04 07/12/2012   INR 0.96 07/09/2012   ABG:  INR: Will add last result for INR, ABG once components are confirmed Will add last 4 CBG results once components are confirmed  Assessment/Plan:  1. CV - SR. On Coreg 6.25 bid, Lisinopril 5 daily, and Digoxin 0.125 daily. SBP has  been in the 90's on average. Will decrease Coreg to 3.125 bid. Will  decrease Lisinopril as well 2.  Pulmonary - Encourage incentive spirometer. CXR this am appears to show no pneumothorax, small left pleural effusion, stable cardiomegaly, and left base atelectasis. 3. Volume Overload - Continue with Lasix 4.  Acute blood loss anemia - H and H this 9.3 and 25.6 5.DM-CBGs 83/90/94. Pre op HGA1C 5.2. Stop glucose checks and scheduled insulin 6.Remove EPW in am 7.Likely discharge on Saturday  ZIMMERMAN,DONIELLE MPA-C 07/19/2012,7:32 AM   patient examined and medical record reviewed,agree with above note. May move to 2000 bed No coumadin at DC if NSR maintained- LV thrombus resolved by TEE Remove arm staples in office in a week VAN TRIGT III,Carollyn Etcheverry 07/19/2012

## 2012-07-20 ENCOUNTER — Telehealth (HOSPITAL_COMMUNITY): Payer: Self-pay | Admitting: Cardiology

## 2012-07-20 ENCOUNTER — Other Ambulatory Visit: Payer: Self-pay | Admitting: *Deleted

## 2012-07-20 DIAGNOSIS — Z951 Presence of aortocoronary bypass graft: Secondary | ICD-10-CM

## 2012-07-20 DIAGNOSIS — I2510 Atherosclerotic heart disease of native coronary artery without angina pectoris: Secondary | ICD-10-CM

## 2012-07-20 MED ORDER — ATORVASTATIN CALCIUM 80 MG PO TABS: 80.0000 mg | ORAL_TABLET | Freq: Every day | ORAL | Status: DC

## 2012-07-20 MED ORDER — ASPIRIN 325 MG PO TBEC: 325.0000 mg | DELAYED_RELEASE_TABLET | Freq: Every day | ORAL | Status: DC

## 2012-07-20 MED ORDER — CARVEDILOL 6.25 MG PO TABS
6.2500 mg | ORAL_TABLET | Freq: Two times a day (BID) | ORAL | Status: DC
  Administered 2012-07-20: 6.25 mg via ORAL
  Filled 2012-07-20 (×4): qty 1

## 2012-07-20 MED ORDER — SIMVASTATIN 40 MG PO TABS
40.0000 mg | ORAL_TABLET | Freq: Every day | ORAL | Status: DC
  Filled 2012-07-20 (×2): qty 1

## 2012-07-20 MED ORDER — OXYCODONE HCL 5 MG PO TABS: 5.0000 mg | ORAL_TABLET | ORAL | Status: DC | PRN

## 2012-07-20 MED ORDER — FE FUMARATE-B12-VIT C-FA-IFC PO CAPS: 1.0000 | ORAL_CAPSULE | Freq: Three times a day (TID) | ORAL | Status: DC

## 2012-07-20 MED ORDER — FUROSEMIDE 40 MG PO TABS: 40.0000 mg | ORAL_TABLET | Freq: Every day | ORAL | Status: DC

## 2012-07-20 MED ORDER — BUDESONIDE-FORMOTEROL FUMARATE 160-4.5 MCG/ACT IN AERO: 2.0000 | INHALATION_SPRAY | Freq: Two times a day (BID) | RESPIRATORY_TRACT | Status: DC

## 2012-07-20 MED ORDER — CARVEDILOL 3.125 MG PO TABS: 3.1250 mg | ORAL_TABLET | Freq: Two times a day (BID) | ORAL | Status: DC

## 2012-07-20 MED ORDER — POTASSIUM CHLORIDE CRYS ER 20 MEQ PO TBCR: 20.0000 meq | EXTENDED_RELEASE_TABLET | Freq: Every day | ORAL | Status: DC

## 2012-07-20 NOTE — Telephone Encounter (Signed)
Will send to Dr Gala Romney to complete

## 2012-07-20 NOTE — Progress Notes (Signed)
Pt ambulated with NT approx. 600 ft.  Tolerated very well, steady gait and pace, back to room with call bell in reach.  Instructed and encouraged to ambulate at least 2 more times today.  Okay'd to ambulated by hisself.  Will continue to monitor and encourage. Ave Filter

## 2012-07-20 NOTE — Telephone Encounter (Signed)
pts father called as pts parole officer had just left home. Pt has an upcoming court date on 08/19/2012, pt will need a letter to explain his hospitalization, current condition, duration of treatment etc.  Should you need to speak with the parole officer MR.Barstow his # is [phone] (father was a little unsure if this was the correct number)

## 2012-07-20 NOTE — Progress Notes (Addendum)
                   [address]            [place] [address]          [phone]      4 Days Post-Op Procedure(s) (LRB): CORONARY ARTERY BYPASS GRAFTING (CABG) (N/A) INTRAOPERATIVE TRANSESOPHAGEAL ECHOCARDIOGRAM (N/A)  Subjective: Patient without complaints this am. Had a bowel movement this morning  Objective: Vital signs in last 24 hours: Temp:  [98 F (36.7 C)-99.5 F (37.5 C)] 98.2 F (36.8 C) (01/17 0535) Pulse Rate:  [74-89] 79  (01/17 0535) Cardiac Rhythm:  [-] Normal sinus rhythm (01/16 2010) Resp:  [18] 18  (01/17 0535) BP: (89-118)/(52-74) 92/59 mmHg (01/17 0535) SpO2:  [95 %-97 %] 95 % (01/17 0535) Weight:  [69.4 kg (153 lb)] 69.4 kg (153 lb) (01/17 0535)  Pre op weight  66 kg Current Weight  07/20/12 69.4 kg (153 lb)      Intake/Output from previous day: 01/16 0701 - 01/17 0700 In: 480 [P.O.:480] Out: 1550 [Urine:1550]   Physical Exam:  Cardiovascular: RRR, no murmurs, gallops, or rubs. Pulmonary: Clear to auscultation on right and slightly diminished at left base; no rales, wheezes, or rhonchi. Abdomen: Soft, non tender, bowel sounds present. Extremities: Mild bilateral lower extremity edema. Wounds: Clean and dry.  No erythema or signs of infection.  Lab Results: CBC:  Basename 07/19/12 0531 07/18/12 0327  WBC 12.0* 11.7*  HGB 9.3* 9.6*  HCT 25.6* 26.7*  PLT 158 128*   BMET:   Basename 07/19/12 0531 07/18/12 0327  NA 134* 132*  K 4.0 3.5  CL 97 98  CO2 25 23  GLUCOSE 97 104*  BUN 14 11  CREATININE 0.82 0.70  CALCIUM 9.4 8.8    PT/INR:  Lab Results  Component Value Date   INR 1.32 07/16/2012   INR 1.04 07/12/2012   INR 0.96 07/09/2012   ABG:  INR: Will add last result for INR, ABG once components are confirmed Will add last 4 CBG results once components are confirmed  Assessment/Plan:  1. CV - SR. On Coreg 3.125 bid, Lisinopril 2.5 daily, and Digoxin 0.125 daily. SBP still in the 90's to low 100's. Stop  Lisinopril for now. Will resume as outpatient when bp allows 2.  Pulmonary - Encourage incentive spirometer. 3. Volume Overload - Continue with Lasix 4.  Acute blood loss anemia - Last H and H this 9.3 and 25.6. Continue Trinsicon 5.Remove EPW  6.Left forearm staples to be removed in one week at office 7.Likely discharge in am  ZIMMERMAN,DONIELLE MPA-C 07/20/2012,7:47 AM

## 2012-07-20 NOTE — Discharge Summary (Signed)
Physician Discharge Summary  Patient ID: Logan [name] MRN: [id number] DOB/AGE: 1975/12/07 37 y.o.  Admit date: 07/09/2012 Discharge date: 07/21/2012  Admission Diagnoses: 1.History of CAD (s/p anterolateral MI with DES to LAD EF 25%; reinfarction 02/05/12 while in [place], [place] with thrombosis of the stent. EF now down to 10%) 2.Ischemic cardiomyopathy 3.History of hyperlipidemia 4.History of chronic systolic heart failure 5.History of tobacco abuse 6.History of LV (left ventricular) mural thrombus  "just found it in Oct" (07/09/2012)  7.History of NSVT (s/p Lifevest) 8.History of GERD 9.History of alcohol abuse (quit July 2013)  Discharge Diagnoses:  1.History of CAD (s/p anterolateral MI with DES to LAD EF 25%; reinfarction 02/05/12 while in [place], [place] with thrombosis of the stent. EF now down to 10%) 2.Ischemic cardiomyopathy 3.History of hyperlipidemia 4.History of chronic systolic heart failure 5.History of tobacco abuse 6.History of LV (left ventricular) mural thrombus  "just found it in Oct" (07/09/2012)  7.History of NSVT (s/p Lifevest) 8.History of GERD 9.ABL anemia 10.History of alcohol abuse (quit July 2013)  Procedure (s):  1.Coronary angiography by Dr. Excell Seltzer on 07/09/2012: Coronary dominance: right  Left mainstem: Patent with minor irregularity  Left anterior descending (LAD): Severe proximal edge stenosis in the prox LAD of 80%. Diag 1 has 90% ostial stenosis. Remaining portions of mid and distal LAD are patent.  Left circumflex (LCx): Diffuse irregular 70% of the prox/mid-LCx. Large OM1 branch without significant stenosis.  Right coronary artery (RCA): Severe diffuse disease of the mid-vessel with late filling of the acute marginal branch and left-to-right collaterals filling the PDA and PLA branches  Left ventriculography:deferred  2. Coronary artery bypass grafting x3 (left internal mammary artery to LAD, saphenous vein graft to obtuse marginal, saphenous vein graft  to right coronary artery).  Exposure, but not harvest of left radial artery.  Endoscopic harvest of bilateral thigh greater saphenous vein. Placement of right femoral A-line for blood pressure monitoring by Dr. Donata Clay on 07/16/2012.  History of Presenting Illness: This is a 37 year old Caucasian male smoker who presented with recurrent angina and in-stent stenosis following LAD drug-eluting stent placed June 2012 because of acute anterior myocardial infarction. The patient has been on Brilinta, which was been stopped since his admission. His ejection fraction is less than 20% and he has no significant valvular disease. He was originally scheduled to undergo an AICD placement by Dr. Alden Benjamin, the morning of admission, 07/09/2012, he described worsening of shortness of breath with new onset of chest pressure. As a result, the AICD placement was cancelled and he underwent a cardiac catheterization The patient has chronic occlusion of the RCA, high-grade proximal LAD stenosis and 80% stenosis of the circumflex marginal. .In addition, a cardiac MRI was ordered for myocardial viability. Of note, the patient has had significant trauma to his left leg with a tibial rod in place. No trauma to the right leg. No history of venous insufficiency of the right leg. A cardiothoracic consultation was obtained with Dr. Donata Clay for the consideration of coronary artery bypass grafting surgery. Pre operative carotid duplex US showed no significant internal carotid artery stenosis.He then underwent a right heart catheterization on 1/9.Potential risks, complications, and benefits were discussed with the patient and he agreed to proceed. We had to wait for a "Brilinta washout period". He then underwent a CABG x 3 on 1/13.  Brief Hospital Course:  He was extubated the evening of surgery. His Theone Murdoch, a line, chest tubes, and foley were all removed early in his post operative course.  He remained afebrile and hemodynamically  stable.He was weaned off Dopamine. He was started on Coreg. He was volume overloaded and diuresed accordingly. Per Dr. Donata Clay, Coumadin was not restarted as LV thrombus was resolved on TEE. He has maintained SR as well. He was felt surgically stable for transfer from the ICU to [facility]U on 07/18/2012. He has been tolerating a diet and has had a bowel movement. He is ambulating well on room air. His epicardial pacing wires will be removed in the morning. Chest tube sutures will be removed prior to discharge. His systolic blood pressure has been in the 90's to occasional low 100. As a result, he is on low dose Coreg and Lisinopril has been stopped. Hopefully, this will be resumed as an outpatient. Provided he remains afebrile, hemodynamically stable, and pending morning round evaluation, he will be surgically stable for discharge on 07/21/2012.  Latest Vital Signs: Blood pressure 92/59, pulse 79, temperature 98.2 F (36.8 C), temperature source Oral, resp. rate 18, height 5\' 8"  (1.727 m), weight 69.4 kg (153 lb), SpO2 95.00%.  Physical Exam: Cardiovascular: RRR, no murmurs, gallops, or rubs.  Pulmonary: Clear to auscultation on right and slightly diminished at left base; no rales, wheezes, or rhonchi.  Abdomen: Soft, non tender, bowel sounds present.  Extremities: Mild bilateral lower extremity edema.  Wounds: Clean and dry. No erythema or signs of infection.   Discharge Condition:Stable  Recent laboratory studies:  Lab Results  Component Value Date   WBC 12.0* 07/19/2012   HGB 9.3* 07/19/2012   HCT 25.6* 07/19/2012   MCV 94.8 07/19/2012   PLT 158 07/19/2012   Lab Results  Component Value Date   NA 134* 07/19/2012   K 4.0 07/19/2012   CL 97 07/19/2012   CO2 25 07/19/2012   CREATININE 0.82 07/19/2012   GLUCOSE 97 07/19/2012      Diagnostic Studies: Dg Chest 2 View  07/19/2012  *RADIOLOGY REPORT*  Clinical Data: CABG  CHEST - 2 VIEW  Comparison: Chest radiograph 07/18/2012  Findings: Sternotomy  wires overlie stable enlarged heart silhouette.  Right central venous line is noted.  There is a left pleural effusion not changed in volume compared to prior.  No pneumothorax.  No pulmonary edema.  IMPRESSION:  1.  Stable support apparatus. 2.  No interval change. 3.  Stable left effusion and basilar atelectasis.   Original Report Authenticated By: Genevive Bi, M.D.     Mr Card Morphology Wo/w Cm  07/10/2012  Cardiac MRI:  Indication: Viability  The patient was scanned on a 1.5 Tesla GE magnet.  Functional imaging was done using Fiesta sequences.  2,3 and 4 chamber views were done to assess for RWMA;s. Quantitative EF was calculated using Simpsons method from a short axis stack on a dedicated work station.  The patient received 20 cc of Multihance.  After 10 minutes inversion recovery sequences were done to assess for viability. Long TI images were done to assess for thrombus  Findings:  There was severe LVE.  The mid and distal anterior wall, septum, apex and infero apex were akinetic.  The quantitative EF was 18% ( EDV 214cc, ESV 175cc and SV 39cc)  There was a moderate sized mural apical thrombus.  There was mild LAE with some MR.  RA and RV were normal.  There was no pericardial effusion  There was full thickness scar involving the mid and distal anterior wall, mid and distal septum apex and inferoapex.  There was subendocardial scar involving the mid  and basal inferior wall.  Impression:     1)    Severe LVE EF 18% see RWMA;s above        2)    Full thickness scar involving the mid and distal anterior wall and septum, apex and inferoapex.  Subendocardial scar in the mid and basal inferior wall 3)    Moderate sized mural apical thrombus 4)    Mild LAE 5)    MR 6)    Normal RA and RV 7)    No pericardial effusion Charlton Haws MD [facility]   Original Report Authenticated By: Charlton Haws, M.D.        Discharge Orders    Future Appointments: Provider: Department: Dept Phone: Center:   08/08/2012 4:30 PM  Kerin Perna, MD [facility] [phone] [facility]     Future Orders Please Complete By Expires   Amb Referral to Cardiac Rehabilitation      Comments:   Pt agrees to Outpt. CRP in [place], will send referral.      Discharge Medications:   Medication List     As of 07/20/2012  1:17 PM    STOP taking these medications         BRILINTA 90 MG Tabs tablet   Generic drug: Ticagrelor      lisinopril 5 MG tablet   Commonly known as: PRINIVIL,ZESTRIL      nitroGLYCERIN 0.4 MG SL tablet   Commonly known as: NITROSTAT      warfarin 5 MG tablet   Commonly known as: COUMADIN      TAKE these medications         aspirin 325 MG EC tablet   Take 1 tablet (325 mg total) by mouth daily.      atorvastatin 80 MG tablet   Commonly known as: LIPITOR   Take 1 tablet (80 mg total) by mouth daily at 6 PM.      budesonide-formoterol 160-4.5 MCG/ACT inhaler   Commonly known as: SYMBICORT   Inhale 2 puffs into the lungs 2 (two) times daily.      carvedilol 3.125 MG tablet   Commonly known as: COREG   Take 1 tablet (3.125 mg total) by mouth 2 (two) times daily.      digoxin 0.125 MG tablet   Commonly known as: LANOXIN   Take 0.125 mg by mouth every morning.      ferrous fumarate-b12-vitamic C-folic acid capsule   Commonly known as: TRINSICON / FOLTRIN   Take 1 capsule by mouth 3 (three) times daily after meals. Take for one month then stop.      furosemide 40 MG tablet   Commonly known as: LASIX   Take 1 tablet (40 mg total) by mouth daily. For 4 days then stop.      oxyCODONE 5 MG immediate release tablet   Commonly known as: Oxy IR/ROXICODONE   Take 1-2 tablets (5-10 mg total) by mouth every 4 (four) hours as needed for pain.      potassium chloride SA 20 MEQ tablet   Commonly known as: K-DUR,KLOR-CON   Take 1 tablet (20 mEq total) by mouth daily.       The patient has been discharged on:   1.Beta Blocker:  Yes [  x ]                               No   [   ]  If No, reason:  2.Ace Inhibitor/ARB: Yes [   ]                                     No  [  x  ]                                     If No, reason:BP labile. Will start as outpatient  3.Statin:   Yes [  x ]                  No  [   ]                  If No, reason:  4.Ecasa:  Yes  [ x  ]                  No   [   ]                  If No, reason:  Follow Up Appointments:     Follow-up Information    Follow up with VAN Dinah Beers, MD. (PA/LAT CXR to be taken on 08/08/2012 at 3:30 pm;Appointment with Dr. Donata Clay is on 08/08/2012 at 4:30 pm)    Contact information:   [address] [place] [address] [phone]       Follow up with Sherryl Manges, MD. (Call for a follow up regarding whther or not AICD to be placed in future)    Contact information:   [address] [place] [address] [phone]       Follow up with Nona Dell, MD. (Call for a follow up for 2 weeks)    Contact information:   [address] [place] [address] [phone]          Signed: Doree Fudge MPA-C 07/20/2012, 1:17 PM

## 2012-07-20 NOTE — Progress Notes (Signed)
EPW x 2 discontinued per MD order and protocol.  Pt tolerated well, no ecotpy or bleeding noted.  Wires intact.  Instructed to remain in bed x 1 hr.  Call bell and urinal in reach, will continue to monitor closely. Ave Filter

## 2012-07-20 NOTE — Progress Notes (Addendum)
Advanced Heart Failure Rounding Note   Subjective:    POD #4 CABG. Doing well. Ambulating. No CP or dyspnea. In NSR. Going home tomorrow. Weight nearing baseline.   Objective:   Weight Range:  Vital Signs:   Temp:  [98.2 F (36.8 C)-99.5 F (37.5 C)] 99.1 F (37.3 C) (01/17 1402) Pulse Rate:  [79-86] 86  (01/17 1402) Resp:  [18-19] 19  (01/17 1402) BP: (92-108)/(52-85) 105/85 mmHg (01/17 1402) SpO2:  [95 %-99 %] 99 % (01/17 1402) Weight:  [69.4 kg (153 lb)] 69.4 kg (153 lb) (01/17 0535) Last BM Date: 07/19/12  Weight change: Filed Weights   07/18/12 0500 07/19/12 0527 07/20/12 0535  Weight: 70.6 kg (155 lb 10.3 oz) 70.4 kg (155 lb 3.3 oz) 69.4 kg (153 lb)    Intake/Output:   Intake/Output Summary (Last 24 hours) at 07/20/12 1505 Last data filed at 07/20/12 1230  Gross per 24 hour  Intake    480 ml  Output   1952 ml  Net  -1472 ml     Physical Exam: General:  Sitting in chair No resp difficulty HEENT: normal Neck: supple.  Cor: Bandages in place. Regular rate & rhythm. Lungs: clear anteriorly. Abdomen: soft, nontender, nondistended. No hepatosplenomegaly. No bruits or masses. Good bowel sounds. Extremities: no cyanosis, clubbing, rash, tr edema. Neuro: alert & orientedx3, cranial nerves grossly intact. moves all 4 extremities w/o difficulty. Affect pleasant  Telemetry: sinus rhythm  Labs: Basic Metabolic Panel:  Lab 07/19/12 1610 07/18/12 0327 07/17/12 1710 07/17/12 1700 07/17/12 0339 07/16/12 2025 07/16/12 0600 07/15/12 0600  NA 134* 132* 136 -- 136 140 -- --  K 4.0 3.5 3.7 -- 4.2 5.2* -- --  CL 97 98 100 -- 104 106 -- --  CO2 25 23 -- -- 24 -- 21 21  GLUCOSE 97 104* 107* -- 105* 134* -- --  BUN 14 11 11  -- 10 8 -- --  CREATININE 0.82 0.70 0.70 0.64 0.74 -- -- --  CALCIUM 9.4 8.8 -- -- 8.7 -- -- --  MG -- -- -- 1.7 2.1 2.7* -- --  PHOS -- -- -- -- -- -- -- --    Liver Function Tests: No results found for this basename:  AST:5,ALT:5,ALKPHOS:5,BILITOT:5,PROT:5,ALBUMIN:5 in the last 168 hours No results found for this basename: LIPASE:5,AMYLASE:5 in the last 168 hours No results found for this basename: AMMONIA:3 in the last 168 hours  CBC:  Lab 07/19/12 0531 07/18/12 0327 07/17/12 1710 07/17/12 1700 07/17/12 0339 07/16/12 2025  WBC 12.0* 11.7* -- 10.8* 12.0* 15.6*  NEUTROABS -- -- -- -- -- --  HGB 9.3* 9.6* 9.2* 9.8* 10.0* --  HCT 25.6* 26.7* 27.0* 27.3* 28.2* --  MCV 94.8 95.0 -- 94.8 95.3 95.3  PLT 158 128* -- 133* 141* 147*    Cardiac Enzymes: No results found for this basename: CKTOTAL:5,CKMB:5,CKMBINDEX:5,TROPONINI:5 in the last 168 hours  BNP: BNP (last 3 results) No results found for this basename: PROBNP:3 in the last 8760 hours    Imaging: Dg Chest 2 View  07/19/2012  *RADIOLOGY REPORT*  Clinical Data: CABG  CHEST - 2 VIEW  Comparison: Chest radiograph 07/18/2012  Findings: Sternotomy wires overlie stable enlarged heart silhouette.  Right central venous line is noted.  There is a left pleural effusion not changed in volume compared to prior.  No pneumothorax.  No pulmonary edema.  IMPRESSION:  1.  Stable support apparatus. 2.  No interval change. 3.  Stable left effusion and basilar atelectasis.   Original Report  Authenticated By: Genevive Bi, M.D.      Medications:     Scheduled Medications:    . acetaminophen  1,000 mg Oral Q6H   Or  . acetaminophen (TYLENOL) oral liquid 160 mg/5 mL  975 mg Per Tube Q6H  . aspirin EC  325 mg Oral Daily  . atorvastatin  80 mg Oral q1800  . bisacodyl  10 mg Oral Daily   Or  . bisacodyl  10 mg Rectal Daily  . budesonide-formoterol  2 puff Inhalation BID  . carvedilol  3.125 mg Oral BID WC  . digoxin  0.125 mg Oral Daily  . docusate sodium  200 mg Oral Daily  . ferrous fumarate-b12-vitamic C-folic acid  1 capsule Oral TID PC  . furosemide  40 mg Oral Daily  . pantoprazole  40 mg Oral Daily  . potassium chloride  20 mEq Oral BID  . sodium  chloride  3 mL Intravenous Q12H  . sodium chloride  3 mL Intravenous Q12H    Infusions:    . sodium chloride      PRN Medications: sodium chloride, bisacodyl, bisacodyl, levalbuterol, magnesium hydroxide, metoprolol, morphine injection, ondansetron (ZOFRAN) IV, oxyCODONE, promethazine, sodium chloride, sodium chloride, traMADol   Assessment:   1. Unstable angina 2. 3v CAD with recurrent stent restenosis at LAD site     S/p CABG 07/18/11 3. 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 NSVT s/p Lifevest 5. H/o ETOH abuse quit 7/13 6. Tobacco abuse  Quit 07/04/12 7. H/o LV thrombus  Plan/Discussion:    Doing very well post CABG.  Agree with d/c tomorrow.  Home meds:  ASA 81 Carvedilol 6.25 bid Lasix 40mg  daily Digoxin 0.125 daily Kcl 20 daily Simva 40 daily  Will see back in HF clinic on 07/31/12 at 11am. Attempt to resume lisinopril at that time.  Appreciate Dr. Zenaida Niece Trigt's care.   Length of Stay: 11   Baruc Tugwell,MD 3:05 PM

## 2012-07-20 NOTE — Progress Notes (Signed)
[phone] Cardiac Rehab Pt walking in hall independently without any problems. Completed discharge education with pt He voices understanding.Discussed smoking cessation. Gave pt tips for quitting and contact number for coaching line.Pt agrees to Outpt. CRP in [place], will send referral.Also reviewed with him signs and symptoms of CHF, zones, when to call MD, daily weights and calling 911.

## 2012-07-21 MED ORDER — CARVEDILOL 6.25 MG PO TABS: 6.2500 mg | ORAL_TABLET | Freq: Two times a day (BID) | ORAL | Status: DC

## 2012-07-21 MED ORDER — CARVEDILOL 3.125 MG PO TABS: 3.1250 mg | ORAL_TABLET | Freq: Two times a day (BID) | ORAL | Status: DC

## 2012-07-21 MED ORDER — SIMVASTATIN 40 MG PO TABS: 40.0000 mg | ORAL_TABLET | Freq: Every day | ORAL | Status: DC

## 2012-07-21 MED ORDER — CARVEDILOL 3.125 MG PO TABS
3.1250 mg | ORAL_TABLET | Freq: Two times a day (BID) | ORAL | Status: DC
  Filled 2012-07-21 (×2): qty 1

## 2012-07-21 NOTE — Progress Notes (Signed)
Advanced Heart Failure Rounding Note   Subjective:    POD #4 CABG. Doing well. Ambulating. No CP or dyspnea. In NSR.   Objective:   Weight Range:  Vital Signs:   Temp:  [98.5 F (36.9 C)-99.1 F (37.3 C)] 98.5 F (36.9 C) (01/18 0453) Pulse Rate:  [74-86] 76  (01/18 0453) Resp:  [18-20] 20  (01/18 0453) BP: (94-105)/(55-85) 104/55 mmHg (01/18 0453) SpO2:  [93 %-99 %] 93 % (01/18 0911) Weight:  [149 lb (67.586 kg)] 149 lb (67.586 kg) (01/18 0453) Last BM Date: 07/19/12  Weight change: Filed Weights   07/19/12 0527 07/20/12 0535 07/21/12 0453  Weight: 155 lb 3.3 oz (70.4 kg) 153 lb (69.4 kg) 149 lb (67.586 kg)    Intake/Output:   Intake/Output Summary (Last 24 hours) at 07/21/12 0916 Last data filed at 07/21/12 0453  Gross per 24 hour  Intake    480 ml  Output   1802 ml  Net  -1322 ml     Physical Exam: General:  WD/WN, NAD HEENT: normal Neck: supple.  Chest: CTA, sternotomy Cor: RRR Abdomen: soft, nontender, nondistended.  Extremities: no edema. Neuro: grossly intact  Telemetry: sinus rhythm  Labs: Basic Metabolic Panel:  Lab 07/19/12 9604 07/18/12 0327 07/17/12 1710 07/17/12 1700 07/17/12 0339 07/16/12 2025 07/16/12 0600 07/15/12 0600  NA 134* 132* 136 -- 136 140 -- --  K 4.0 3.5 3.7 -- 4.2 5.2* -- --  CL 97 98 100 -- 104 106 -- --  CO2 25 23 -- -- 24 -- 21 21  GLUCOSE 97 104* 107* -- 105* 134* -- --  BUN 14 11 11  -- 10 8 -- --  CREATININE 0.82 0.70 0.70 0.64 0.74 -- -- --  CALCIUM 9.4 8.8 -- -- 8.7 -- -- --  MG -- -- -- 1.7 2.1 2.7* -- --  PHOS -- -- -- -- -- -- -- --    CBC:  Lab 07/19/12 0531 07/18/12 0327 07/17/12 1710 07/17/12 1700 07/17/12 0339 07/16/12 2025  WBC 12.0* 11.7* -- 10.8* 12.0* 15.6*  NEUTROABS -- -- -- -- -- --  HGB 9.3* 9.6* 9.2* 9.8* 10.0* --  HCT 25.6* 26.7* 27.0* 27.3* 28.2* --  MCV 94.8 95.0 -- 94.8 95.3 95.3  PLT 158 128* -- 133* 141* 147*    BNP: BNP (last 3 results) No results found for this basename: PROBNP:3  in the last 8760 hours    Imaging: No results found.   Medications:     Scheduled Medications:    . acetaminophen  1,000 mg Oral Q6H   Or  . acetaminophen (TYLENOL) oral liquid 160 mg/5 mL  975 mg Per Tube Q6H  . aspirin EC  325 mg Oral Daily  . bisacodyl  10 mg Oral Daily   Or  . bisacodyl  10 mg Rectal Daily  . budesonide-formoterol  2 puff Inhalation BID  . carvedilol  6.25 mg Oral BID WC  . digoxin  0.125 mg Oral Daily  . docusate sodium  200 mg Oral Daily  . ferrous fumarate-b12-vitamic C-folic acid  1 capsule Oral TID PC  . furosemide  40 mg Oral Daily  . pantoprazole  40 mg Oral Daily  . potassium chloride  20 mEq Oral BID  . simvastatin  40 mg Oral q1800  . sodium chloride  3 mL Intravenous Q12H  . sodium chloride  3 mL Intravenous Q12H    Infusions:    . sodium chloride      PRN Medications: sodium  chloride, bisacodyl, bisacodyl, levalbuterol, magnesium hydroxide, metoprolol, morphine injection, ondansetron (ZOFRAN) IV, oxyCODONE, promethazine, sodium chloride, sodium chloride, traMADol   Assessment:   1. Unstable angina 2. 3v CAD with recurrent stent restenosis at LAD site     S/p CABG 07/18/11 3. 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 NSVT s/p Lifevest 5. H/o ETOH abuse quit 7/13 6. Tobacco abuse  Quit 07/04/12 7. H/o LV thrombus  Plan/Discussion:    Doing very well post CABG. Patient will need life vest prior to DC (he has it at home; h/o ischemic CM and syncope); reassess LV function 3 months following CABG; if EF < 35 will need ICD. Decrease coreg to 3.125 BID and DC digoxin due to bradycardia.  Will see back in HF clinic on 07/31/12 at 11am.    Length of Stay: 12   Milinda Sweeney,MD 9:16 AM

## 2012-07-21 NOTE — Progress Notes (Addendum)
[address]            [place] [address]          [phone]     5 Days Post-Op  Procedure(s) (LRB): CORONARY ARTERY BYPASS GRAFTING (CABG) (N/A) INTRAOPERATIVE TRANSESOPHAGEAL ECHOCARDIOGRAM (N/A) Subjective: Feels good, ready for discharge  Objective  Telemetry SR, Sbrady, pvc's  Temp:  [98.5 F (36.9 C)-99.1 F (37.3 C)] 98.5 F (36.9 C) (01/18 0453) Pulse Rate:  [74-86] 76  (01/18 0453) Resp:  [18-20] 20  (01/18 0453) BP: (94-105)/(55-85) 104/55 mmHg (01/18 0453) SpO2:  [98 %-99 %] 98 % (01/18 0453) Weight:  [149 lb (67.586 kg)] 149 lb (67.586 kg) (01/18 0453)   Intake/Output Summary (Last 24 hours) at 07/21/12 0807 Last data filed at 07/21/12 0453  Gross per 24 hour  Intake    480 ml  Output   1802 ml  Net  -1322 ml       General appearance: alert, cooperative and no distress Heart: regular rate and rhythm Lungs: mildly dim in bases Abdomen: benign Extremities: no edema Wound: incisions healing well, some mild numbness left hand in region of thumb  Lab Results:  Basename 07/19/12 0531  NA 134*  K 4.0  CL 97  CO2 25  GLUCOSE 97  BUN 14  CREATININE 0.82  CALCIUM 9.4  MG --  PHOS --   No results found for this basename: AST:2,ALT:2,ALKPHOS:2,BILITOT:2,PROT:2,ALBUMIN:2 in the last 72 hours No results found for this basename: LIPASE:2,AMYLASE:2 in the last 72 hours  Basename 07/19/12 0531  WBC 12.0*  NEUTROABS --  HGB 9.3*  HCT 25.6*  MCV 94.8  PLT 158   No results found for this basename: CKTOTAL:4,CKMB:4,TROPONINI:4 in the last 72 hours No components found with this basename: POCBNP:3 No results found for this basename: DDIMER in the last 72 hours No results found for this basename: HGBA1C in the last 72 hours No results found for this basename: CHOL,HDL,LDLCALC,TRIG,CHOLHDL in the last 72 hours No results found for this basename: TSH,T4TOTAL,FREET3,T3FREE,THYROIDAB in the last 72 hours No results found  for this basename: VITAMINB12,FOLATE,FERRITIN,TIBC,IRON,RETICCTPCT in the last 72 hours  Medications: Scheduled    . acetaminophen  1,000 mg Oral Q6H   Or  . acetaminophen (TYLENOL) oral liquid 160 mg/5 mL  975 mg Per Tube Q6H  . aspirin EC  325 mg Oral Daily  . bisacodyl  10 mg Oral Daily   Or  . bisacodyl  10 mg Rectal Daily  . budesonide-formoterol  2 puff Inhalation BID  . carvedilol  6.25 mg Oral BID WC  . digoxin  0.125 mg Oral Daily  . docusate sodium  200 mg Oral Daily  . ferrous fumarate-b12-vitamic C-folic acid  1 capsule Oral TID PC  . furosemide  40 mg Oral Daily  . pantoprazole  40 mg Oral Daily  . potassium chloride  20 mEq Oral BID  . simvastatin  40 mg Oral q1800  . sodium chloride  3 mL Intravenous Q12H  . sodium chloride  3 mL Intravenous Q12H     Radiology/Studies:  No results found.  INR: Will add last result for INR, ABG once components are confirmed Will add last 4 CBG results once components are confirmed  Assessment/Plan: S/P Procedure(s) (LRB): CORONARY ARTERY BYPASS GRAFTING (CABG) (N/A) INTRAOPERATIVE TRANSESOPHAGEAL ECHOCARDIOGRAM (N/A)  Doing well, may have to decrease beta blocker dose, asked cardiology to review.  Otherwise stable for D/C   LOS: 12 days    GOLD,WAYNE E 1/18/20148:07 AM    Home today when he gets life vest from home to go with. I have seen and examined Neila Gear and agree with the above assessment  and plan.  Delight Ovens MD [facility] [phone] Office [phone] 07/21/2012 11:24 AM

## 2012-07-21 NOTE — Progress Notes (Signed)
CTS x 2 removed per MD order and protocol.  Steris and betadine applied, sites approximated, pt tolerated well.  Will continue to monitor closely. Ave Filter

## 2012-07-24 ENCOUNTER — Other Ambulatory Visit: Payer: Self-pay | Admitting: *Deleted

## 2012-07-24 DIAGNOSIS — G8918 Other acute postprocedural pain: Secondary | ICD-10-CM

## 2012-07-24 MED ORDER — HYDROCODONE-ACETAMINOPHEN 7.5-325 MG PO TABS: 1.0000 | ORAL_TABLET | ORAL | Status: DC | PRN

## 2012-07-26 ENCOUNTER — Encounter (HOSPITAL_COMMUNITY): Payer: Self-pay | Admitting: Internal Medicine

## 2012-07-27 ENCOUNTER — Other Ambulatory Visit: Payer: Self-pay | Admitting: *Deleted

## 2012-07-27 ENCOUNTER — Ambulatory Visit (INDEPENDENT_AMBULATORY_CARE_PROVIDER_SITE_OTHER): Payer: Self-pay | Admitting: *Deleted

## 2012-07-27 VITALS — Ht 68.0 in | Wt 146.2 lb

## 2012-07-27 DIAGNOSIS — G8918 Other acute postprocedural pain: Secondary | ICD-10-CM

## 2012-07-27 DIAGNOSIS — I2510 Atherosclerotic heart disease of native coronary artery without angina pectoris: Secondary | ICD-10-CM

## 2012-07-27 DIAGNOSIS — Z4802 Encounter for removal of sutures: Secondary | ICD-10-CM

## 2012-07-27 DIAGNOSIS — Z951 Presence of aortocoronary bypass graft: Secondary | ICD-10-CM

## 2012-07-27 MED ORDER — HYDROCODONE-ACETAMINOPHEN 10-325 MG PO TABS: 1.0000 | ORAL_TABLET | ORAL | Status: DC | PRN

## 2012-07-27 NOTE — Telephone Encounter (Signed)
Pt aware letter complete, he will come by today and pick it up

## 2012-07-27 NOTE — Progress Notes (Signed)
Returns for staple removal of the left lower arm long incision.  It was going to be an evh radial site but was aborted.  It is very well healed. His sternal incision, previous chest tube sites and left and right leg evh sites are also healing well.  No lower extremity edema. Appetite and bowels o.k.  He is using pain med on a regular basis.  Pain med was refilled.  He is seeing Dr. Gala Romney next week and will see Dr. Donata Clay as scheduled with a cxr.

## 2012-07-28 NOTE — Discharge Summary (Signed)
patient examined and medical record reviewed,agree with above note. VAN TRIGT III,Erin Obando 07/28/2012

## 2012-07-31 ENCOUNTER — Ambulatory Visit (HOSPITAL_COMMUNITY): Payer: Medicaid Other

## 2012-08-01 ENCOUNTER — Other Ambulatory Visit: Payer: Self-pay | Admitting: *Deleted

## 2012-08-01 DIAGNOSIS — G8918 Other acute postprocedural pain: Secondary | ICD-10-CM

## 2012-08-01 MED ORDER — HYDROCODONE-ACETAMINOPHEN 10-325 MG PO TABS: 1.0000 | ORAL_TABLET | ORAL | Status: DC | PRN

## 2012-08-07 ENCOUNTER — Other Ambulatory Visit: Payer: Self-pay

## 2012-08-07 DIAGNOSIS — G8918 Other acute postprocedural pain: Secondary | ICD-10-CM

## 2012-08-07 MED ORDER — HYDROCODONE-ACETAMINOPHEN 10-325 MG PO TABS: 1.0000 | ORAL_TABLET | ORAL | Status: DC | PRN

## 2012-08-07 NOTE — Telephone Encounter (Signed)
Rx refill for Norco 10/325 mg #40/no refills called to pt's pharm.

## 2012-08-08 ENCOUNTER — Ambulatory Visit: Payer: Self-pay | Admitting: Cardiothoracic Surgery

## 2012-08-08 ENCOUNTER — Other Ambulatory Visit: Payer: Self-pay

## 2012-08-15 ENCOUNTER — Ambulatory Visit: Payer: Self-pay | Admitting: Physician Assistant

## 2012-08-21 ENCOUNTER — Other Ambulatory Visit: Payer: Self-pay | Admitting: *Deleted

## 2012-08-21 DIAGNOSIS — G8918 Other acute postprocedural pain: Secondary | ICD-10-CM

## 2012-08-21 MED ORDER — HYDROCODONE-ACETAMINOPHEN 5-325 MG PO TABS: 1.0000 | ORAL_TABLET | Freq: Four times a day (QID) | ORAL | Status: DC | PRN

## 2012-08-22 ENCOUNTER — Ambulatory Visit
Admission: RE | Admit: 2012-08-22 | Discharge: 2012-08-22 | Disposition: A | Discharge status: Home / Self Care | Payer: Medicaid Other | Source: Ambulatory Visit | Attending: Cardiothoracic Surgery | Admitting: Cardiothoracic Surgery

## 2012-08-22 ENCOUNTER — Encounter: Payer: Self-pay | Admitting: Cardiothoracic Surgery

## 2012-08-22 VITALS — BP 106/69 | HR 84 | Resp 20 | Ht 68.0 in | Wt 146.0 lb

## 2012-08-22 DIAGNOSIS — Z951 Presence of aortocoronary bypass graft: Secondary | ICD-10-CM

## 2012-08-22 DIAGNOSIS — I2510 Atherosclerotic heart disease of native coronary artery without angina pectoris: Secondary | ICD-10-CM

## 2012-08-23 NOTE — Progress Notes (Signed)
This encounter was created in error - please disregard.

## 2012-08-24 ENCOUNTER — Telehealth: Payer: Self-pay

## 2012-08-24 DIAGNOSIS — G8918 Other acute postprocedural pain: Secondary | ICD-10-CM

## 2012-08-24 MED ORDER — HYDROCODONE-ACETAMINOPHEN 10-325 MG PO TABS: 1.0000 | ORAL_TABLET | Freq: Four times a day (QID) | ORAL | Status: DC | PRN

## 2012-08-24 NOTE — Telephone Encounter (Signed)
Pt called stating that he "flushed down the drain"  the RX for Norco 5/325 mg given to him on 08/21/2012 because they were not helping his pain. He call back requesting stronger RX. He was given Norco 10/325 mg after hospital discharge and it worked well. Logan [name] had a post op appt with Logan [name] on 08/22/2012 that he No Showed (walked Out without seeing MD). He was reschedule for 09/05/2012 and instructed to keep that post op appt.  He was also reminded to schedule appt to see his cardiologist soon for post op follow up and mediation management.  Pt aware and understands

## 2012-08-29 ENCOUNTER — Other Ambulatory Visit: Payer: Self-pay | Admitting: *Deleted

## 2012-08-29 DIAGNOSIS — G8918 Other acute postprocedural pain: Secondary | ICD-10-CM

## 2012-08-29 MED ORDER — HYDROCODONE-ACETAMINOPHEN 10-325 MG PO TABS: 1.0000 | ORAL_TABLET | Freq: Four times a day (QID) | ORAL | Status: DC | PRN

## 2012-09-05 ENCOUNTER — Encounter: Payer: Self-pay | Admitting: Cardiothoracic Surgery

## 2012-09-05 ENCOUNTER — Ambulatory Visit (INDEPENDENT_AMBULATORY_CARE_PROVIDER_SITE_OTHER): Payer: Self-pay | Admitting: Cardiothoracic Surgery

## 2012-09-05 ENCOUNTER — Ambulatory Visit: Payer: Medicaid Other | Admitting: Cardiothoracic Surgery

## 2012-09-05 VITALS — BP 103/66 | HR 85 | Resp 16 | Ht 68.0 in | Wt 145.0 lb

## 2012-09-05 DIAGNOSIS — I2510 Atherosclerotic heart disease of native coronary artery without angina pectoris: Secondary | ICD-10-CM

## 2012-09-05 DIAGNOSIS — Z951 Presence of aortocoronary bypass graft: Secondary | ICD-10-CM
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
## 2012-09-10 ENCOUNTER — Encounter: Payer: Self-pay | Admitting: *Deleted

## 2012-09-10 ENCOUNTER — Other Ambulatory Visit: Payer: Self-pay | Admitting: *Deleted

## 2012-09-10 DIAGNOSIS — G8918 Other acute postprocedural pain: Secondary | ICD-10-CM

## 2012-09-10 MED ORDER — HYDROCODONE-ACETAMINOPHEN 5-325 MG PO TABS: 1.0000 | ORAL_TABLET | Freq: Four times a day (QID) | ORAL | Status: DC | PRN

## 2012-09-20 ENCOUNTER — Other Ambulatory Visit: Payer: Self-pay | Admitting: *Deleted

## 2012-09-20 DIAGNOSIS — G8918 Other acute postprocedural pain: Secondary | ICD-10-CM

## 2012-09-20 MED ORDER — HYDROCODONE-ACETAMINOPHEN 5-325 MG PO TABS: 1.0000 | ORAL_TABLET | Freq: Four times a day (QID) | ORAL | Status: DC | PRN

## 2012-09-20 MED ORDER — GABAPENTIN 300 MG PO CAPS: 300.0000 mg | ORAL_CAPSULE | Freq: Two times a day (BID) | ORAL | Status: DC

## 2012-09-26 ENCOUNTER — Other Ambulatory Visit: Payer: Self-pay | Admitting: *Deleted

## 2012-09-26 DIAGNOSIS — G8918 Other acute postprocedural pain: Secondary | ICD-10-CM

## 2012-09-26 MED ORDER — HYDROCODONE-ACETAMINOPHEN 10-325 MG PO TABS: 1.0000 | ORAL_TABLET | Freq: Four times a day (QID) | ORAL | Status: DC | PRN

## 2012-10-01 ENCOUNTER — Ambulatory Visit (HOSPITAL_COMMUNITY): Payer: Medicaid Other | Attending: Internal Medicine

## 2012-10-03 ENCOUNTER — Other Ambulatory Visit: Payer: Self-pay | Admitting: *Deleted

## 2012-10-03 DIAGNOSIS — G8918 Other acute postprocedural pain: Secondary | ICD-10-CM

## 2012-10-03 MED ORDER — HYDROCODONE-ACETAMINOPHEN 10-325 MG PO TABS: 1.0000 | ORAL_TABLET | Freq: Four times a day (QID) | ORAL | Status: DC | PRN

## 2012-10-09 ENCOUNTER — Telehealth: Payer: Self-pay

## 2012-10-09 NOTE — Telephone Encounter (Signed)
Logan [name] is currently in a [place] in [place]. Pending a court date, release unknown. He is stating to the staff at the [facility] that he is suppose to be wearing a defibrillator vest. I referred her to Logan [name]'s Cardiologist for the answer to that question.

## 2012-10-10 ENCOUNTER — Other Ambulatory Visit: Payer: Self-pay | Admitting: *Deleted

## 2012-10-10 DIAGNOSIS — G8918 Other acute postprocedural pain: Secondary | ICD-10-CM

## 2012-10-10 MED ORDER — HYDROCODONE-ACETAMINOPHEN 10-325 MG PO TABS: 1.0000 | ORAL_TABLET | Freq: Four times a day (QID) | ORAL | Status: DC | PRN

## 2012-10-10 MED ORDER — GABAPENTIN 300 MG PO CAPS: 300.0000 mg | ORAL_CAPSULE | Freq: Two times a day (BID) | ORAL | Status: DC

## 2012-10-10 NOTE — Telephone Encounter (Signed)
Logan [name] states " he got out of jail and they kept my hydrocodone and neurontin." He is requesting a refill for both.  He also says he is finally going to see Dr. Gala Romney on 4/22.

## 2012-10-22 ENCOUNTER — Other Ambulatory Visit: Payer: Self-pay | Admitting: *Deleted

## 2012-10-22 ENCOUNTER — Encounter: Payer: Self-pay | Admitting: *Deleted

## 2012-10-22 DIAGNOSIS — G8918 Other acute postprocedural pain: Secondary | ICD-10-CM

## 2012-10-22 MED ORDER — HYDROCODONE-ACETAMINOPHEN 10-325 MG PO TABS: 1.0000 | ORAL_TABLET | Freq: Four times a day (QID) | ORAL | Status: DC | PRN

## 2012-10-23 ENCOUNTER — Ambulatory Visit (HOSPITAL_COMMUNITY): Payer: Medicaid Other | Attending: Internal Medicine

## 2012-10-24 ENCOUNTER — Ambulatory Visit: Payer: Medicaid Other | Admitting: Cardiothoracic Surgery

## 2012-11-05 ENCOUNTER — Other Ambulatory Visit: Payer: Self-pay | Admitting: *Deleted

## 2012-11-05 DIAGNOSIS — G8918 Other acute postprocedural pain: Secondary | ICD-10-CM

## 2012-11-05 MED ORDER — HYDROCODONE-ACETAMINOPHEN 7.5-325 MG PO TABS: 1.0000 | ORAL_TABLET | Freq: Four times a day (QID) | ORAL | Status: DC | PRN

## 2012-11-07 ENCOUNTER — Ambulatory Visit: Payer: Medicaid Other | Admitting: Cardiothoracic Surgery

## 2012-11-20 ENCOUNTER — Other Ambulatory Visit: Payer: Self-pay | Admitting: *Deleted

## 2012-11-20 DIAGNOSIS — G8918 Other acute postprocedural pain: Secondary | ICD-10-CM

## 2012-11-20 MED ORDER — HYDROCODONE-ACETAMINOPHEN 7.5-325 MG PO TABS: 1.0000 | ORAL_TABLET | Freq: Four times a day (QID) | ORAL | Status: DC | PRN

## 2012-12-03 ENCOUNTER — Other Ambulatory Visit: Payer: Self-pay | Admitting: *Deleted

## 2012-12-05 ENCOUNTER — Ambulatory Visit: Payer: Self-pay | Admitting: Cardiothoracic Surgery

## 2013-02-13 ENCOUNTER — Ambulatory Visit (HOSPITAL_COMMUNITY)
Admission: RE | Admit: 2013-02-13 | Discharge: 2013-02-13 | Disposition: A | Discharge status: Home / Self Care | Payer: Medicaid Other | Source: Ambulatory Visit | Attending: Internal Medicine | Admitting: Internal Medicine

## 2013-02-13 ENCOUNTER — Encounter (HOSPITAL_COMMUNITY): Payer: Self-pay

## 2013-02-13 VITALS — BP 96/68 | HR 62 | Wt 152.1 lb

## 2013-02-13 DIAGNOSIS — Z7982 Long term (current) use of aspirin: Secondary | ICD-10-CM | POA: Insufficient documentation

## 2013-02-13 DIAGNOSIS — I5189 Other ill-defined heart diseases: Secondary | ICD-10-CM | POA: Insufficient documentation

## 2013-02-13 DIAGNOSIS — I5022 Chronic systolic (congestive) heart failure: Secondary | ICD-10-CM | POA: Insufficient documentation

## 2013-02-13 DIAGNOSIS — K219 Gastro-esophageal reflux disease without esophagitis: Secondary | ICD-10-CM | POA: Insufficient documentation

## 2013-02-13 DIAGNOSIS — Z951 Presence of aortocoronary bypass graft: Secondary | ICD-10-CM | POA: Insufficient documentation

## 2013-02-13 DIAGNOSIS — E785 Hyperlipidemia, unspecified: Secondary | ICD-10-CM | POA: Insufficient documentation

## 2013-02-13 DIAGNOSIS — Z79899 Other long term (current) drug therapy: Secondary | ICD-10-CM | POA: Insufficient documentation

## 2013-02-13 DIAGNOSIS — I2510 Atherosclerotic heart disease of native coronary artery without angina pectoris: Secondary | ICD-10-CM

## 2013-02-13 DIAGNOSIS — I509 Heart failure, unspecified: Secondary | ICD-10-CM

## 2013-02-13 DIAGNOSIS — Z91199 Patient's noncompliance with other medical treatment and regimen due to unspecified reason: Secondary | ICD-10-CM | POA: Insufficient documentation

## 2013-02-13 DIAGNOSIS — F17200 Nicotine dependence, unspecified, uncomplicated: Secondary | ICD-10-CM

## 2013-02-13 DIAGNOSIS — I252 Old myocardial infarction: Secondary | ICD-10-CM | POA: Insufficient documentation

## 2013-02-13 DIAGNOSIS — Z720 Tobacco use: Secondary | ICD-10-CM

## 2013-02-13 DIAGNOSIS — Z9119 Patient's noncompliance with other medical treatment and regimen: Secondary | ICD-10-CM | POA: Insufficient documentation

## 2013-02-13 DIAGNOSIS — Z9861 Coronary angioplasty status: Secondary | ICD-10-CM | POA: Insufficient documentation

## 2013-02-13 LAB — BASIC METABOLIC PANEL
CO2: 23 mEq/L (ref 19–32)
Chloride: 105 mEq/L (ref 96–112)
Glucose, Bld: 83 mg/dL (ref 70–99)
Sodium: 139 mEq/L (ref 135–145)

## 2013-02-13 MED ORDER — CARVEDILOL 3.125 MG PO TABS: 3.1250 mg | ORAL_TABLET | Freq: Two times a day (BID) | ORAL | Status: DC

## 2013-02-13 MED ORDER — ATORVASTATIN CALCIUM 80 MG PO TABS: 80.0000 mg | ORAL_TABLET | Freq: Every day | ORAL | Status: DC

## 2013-02-13 MED ORDER — ASPIRIN 325 MG PO TBEC: 325.0000 mg | DELAYED_RELEASE_TABLET | Freq: Every day | ORAL | Status: DC

## 2013-02-13 NOTE — Progress Notes (Signed)
Cardiologist: Dr Elease Hashimoto  PCP: None   HPI:  Mr. Mcclure is a 37 year old male with CAD s/p CABG x 3; LIMA-LAD, SVG -obtuse marginal, SVG-RCA (07/2012), ischemic cardiomyopathy- Hx of anterolateral MI with DES to LAD; reinfarction 02/05/12 while in [place], [place] with thrombosis of the stent. He also has a history of nicotine dependence, previous ETOH abuse, and non-compliance.  CPX Results 06/2012 Peak VO2: 21.9 % predicted peak VO2: 52.9% VE/VCO2 slope: 33.7 OUES: 1.53 Peak RER: 1.31 Ventilatory Threshold: 17.4 % predicted peak VO2: 42%   [facility] 07/2012 RA = 4  RV = 35/2/6  PA = 30/14 (20)  PCW = 15 v = 25  Fick cardiac output/index = 3.7/2.1  PVR = 1.4 Woods  FA sat = 97%  PA sat = 62%, 65%  Assessment:  1. Severe ICM with well compansated hemodynamics  2. Prominent v-waves c/w mitral regurgitation  Follow up: Since last visit has had CABG x 3 in January and has missed apts with [place] and TCTS. Still complaining of SOB with walking about 100 yds. Denies orthopnea, CP or PND. + LE edema occasionally. Has not been taking his medications since March. Not wearing LifeVest. Weight at home 150-155 lbs.   ROS: All systems negative except as listed in HPI, PMH and Problem List.  Past Medical History  Diagnosis Date  . GERD (gastroesophageal reflux disease)   . CAD (coronary artery disease) 01/22/12    a.anterolateral MI with DES to LAD EF 25% b. reinfarction 8/13 with thrombosis of stent c.LIMA-LAD, SVG-OM, SVG-RCA (07/2012)   . Hyperlipidemia   . Tobacco abuse   . Chronic systolic CHF (congestive heart failure)     a. (07/2012) EF 20-25% diff HK with reg wall abnl, HK anterolat wall, akinesis inferosep, mod MR,   . Noncompliance   . Syncope   . LV (left ventricular) mural thrombus     "just found it in Oct" (07/09/2012)  . Ischemic cardiomyopathy   . Anginal pain   . Myocardial infarction 01/2012; 02/2012; 04/2012   NOT currently taking medications!!! Current Outpatient Prescriptions   Medication Sig Dispense Refill  . aspirin 325 MG EC tablet Take 1 tablet (325 mg total) by mouth daily.  30 tablet    . atorvastatin (LIPITOR) 80 MG tablet Take 1 tablet (80 mg total) by mouth daily at 6 PM.  30 tablet  6  . carvedilol (COREG) 3.125 MG tablet Take 1 tablet (3.125 mg total) by mouth 2 (two) times daily with a meal.  60 tablet  6   No current facility-administered medications for this encounter.   PHYSICAL EXAM: Filed Vitals:   02/13/13 1334  BP: 96/68  Pulse: 62  Weight: 152 lb 1 oz (68.975 kg)  SpO2: 99%   General:  Ill appearing. No resp difficulty HEENT: normal Neck: supple. JVP flat. Carotids 2+ bilaterally; no bruits. No lymphadenopathy or thryomegaly appreciated. Cor: PMI normal. Regular rate & rhythm. No rubs, gallops or murmurs. Lungs: clear Abdomen: soft, nontender, nondistended. No hepatosplenomegaly. No bruits or masses. Good bowel sounds. Extremities: no cyanosis, clubbing, rash, edema Neuro: alert & orientedx3, cranial nerves grossly intact. Moves all 4 extremities w/o difficulty. Affect pleasant.  ASSESSMENT & PLAN: General:   Fatigued appearing. No respiratory difficulty; GF present HEENT: normal Neck: supple. no JVD. Carotids 2+ bilat; no bruits. No lymphadenopathy or thryomegaly appreciated. Cor: PMI nondisplaced. Regular rate & rhythm. No rubs, gallops or murmurs. Midline incision approximated and healed Lungs: clear Abdomen: soft, nontender, nondistended. No hepatosplenomegaly. No  bruits or masses. Good bowel sounds. Extremities: no cyanosis, clubbing, rash, trace edema Neuro: alert & oriented x 3, cranial nerves grossly intact. moves all 4 extremities w/o difficulty. Affect pleasant.  ASSESSMENT & PLAN:  1) Chronic systolic HF, EF 25% -NYHA III symptoms. Volume status stable. -Patient is non-compliant and has been off all medications except Tylenol since at least March. He was previously on coreg 3.125 mg BID, lipitor 80 mg, digoxin 0.125,  and ASA 325 mg. He also states that before his surgery he was on brilinta, potassium 20 meq, lasix 40 mg, and lisinopril 5 mg. - Lengthy discussion with patient about his prognosis and his condition. Discussed the need to be compliant with medications and follow up. Discussed that at this time he would not be considered a candidate for tx or advanced therapies d/t non-compliance and smoking. He states he understands. - EF < 35% and he has not been on OMM. Says he still has LifeVest at home and recommended that he wear if 24/7 except for showers. Discussed the increased risk of SCD. - BP soft, but will try and re-start coreg 3.125 mg BID. Instructed to call if he is dizzy or can't tolerate. - Not on an ACE-I. Can try to add digoxin back next visit.  2) CAD, s/p CABG x 3 - no s/s of ischemia - Restart lipitor 80 mg daily and ASA  Follow up 1 month  Ulla Potash B NP-C 8:00 PM

## 2013-02-13 NOTE — Patient Instructions (Addendum)
Start taking coreg 3.125 mg BID.  Lipitor 80 mg every day.  ASA 325 mg day  Follow up 1 month with ECHO  Call any issues [phone]

## 2013-02-27 ENCOUNTER — Ambulatory Visit: Payer: Self-pay | Admitting: Pharmacist

## 2013-02-27 DIAGNOSIS — I255 Ischemic cardiomyopathy: Secondary | ICD-10-CM

## 2013-02-27 DIAGNOSIS — I513 Intracardiac thrombosis, not elsewhere classified: Secondary | ICD-10-CM

## 2013-02-27 DIAGNOSIS — Z7901 Long term (current) use of anticoagulants: Secondary | ICD-10-CM

## 2013-03-19 ENCOUNTER — Other Ambulatory Visit (HOSPITAL_COMMUNITY): Payer: Self-pay | Admitting: Cardiology

## 2013-03-19 DIAGNOSIS — I5022 Chronic systolic (congestive) heart failure: Secondary | ICD-10-CM

## 2013-03-19 MED ORDER — ATORVASTATIN CALCIUM 80 MG PO TABS: 80.0000 mg | ORAL_TABLET | Freq: Every day | ORAL | Status: DC

## 2013-03-19 MED ORDER — CARVEDILOL 3.125 MG PO TABS: 3.1250 mg | ORAL_TABLET | Freq: Two times a day (BID) | ORAL | Status: DC

## 2013-03-19 NOTE — Telephone Encounter (Signed)
Pt called to request refills °

## 2013-03-21 ENCOUNTER — Encounter (HOSPITAL_COMMUNITY): Payer: Medicaid Other

## 2013-03-21 ENCOUNTER — Ambulatory Visit (HOSPITAL_COMMUNITY): Payer: Medicaid Other

## 2013-04-08 ENCOUNTER — Ambulatory Visit (HOSPITAL_COMMUNITY)
Admission: RE | Admit: 2013-04-08 | Discharge: 2013-04-08 | Disposition: A | Discharge status: Home / Self Care | Payer: Medicaid Other | Source: Ambulatory Visit | Attending: Internal Medicine | Admitting: Internal Medicine

## 2013-04-08 ENCOUNTER — Ambulatory Visit (HOSPITAL_BASED_OUTPATIENT_CLINIC_OR_DEPARTMENT_OTHER)
Admission: RE | Admit: 2013-04-08 | Discharge: 2013-04-08 | Disposition: A | Discharge status: Home / Self Care | Payer: Medicaid Other | Source: Ambulatory Visit | Attending: Cardiology | Admitting: Cardiology

## 2013-04-08 VITALS — BP 102/64 | HR 62 | Wt 152.5 lb

## 2013-04-08 DIAGNOSIS — I255 Ischemic cardiomyopathy: Secondary | ICD-10-CM

## 2013-04-08 DIAGNOSIS — I2510 Atherosclerotic heart disease of native coronary artery without angina pectoris: Secondary | ICD-10-CM | POA: Insufficient documentation

## 2013-04-08 DIAGNOSIS — Z951 Presence of aortocoronary bypass graft: Secondary | ICD-10-CM | POA: Insufficient documentation

## 2013-04-08 DIAGNOSIS — I5189 Other ill-defined heart diseases: Secondary | ICD-10-CM | POA: Insufficient documentation

## 2013-04-08 DIAGNOSIS — R0609 Other forms of dyspnea: Secondary | ICD-10-CM

## 2013-04-08 DIAGNOSIS — F17200 Nicotine dependence, unspecified, uncomplicated: Secondary | ICD-10-CM | POA: Insufficient documentation

## 2013-04-08 DIAGNOSIS — E785 Hyperlipidemia, unspecified: Secondary | ICD-10-CM | POA: Insufficient documentation

## 2013-04-08 DIAGNOSIS — I252 Old myocardial infarction: Secondary | ICD-10-CM | POA: Insufficient documentation

## 2013-04-08 DIAGNOSIS — I2589 Other forms of chronic ischemic heart disease: Secondary | ICD-10-CM

## 2013-04-08 DIAGNOSIS — I219 Acute myocardial infarction, unspecified: Secondary | ICD-10-CM

## 2013-04-08 DIAGNOSIS — I059 Rheumatic mitral valve disease, unspecified: Secondary | ICD-10-CM

## 2013-04-08 DIAGNOSIS — I5022 Chronic systolic (congestive) heart failure: Secondary | ICD-10-CM | POA: Insufficient documentation

## 2013-04-08 DIAGNOSIS — K219 Gastro-esophageal reflux disease without esophagitis: Secondary | ICD-10-CM | POA: Insufficient documentation

## 2013-04-08 DIAGNOSIS — Y838 Other surgical procedures as the cause of abnormal reaction of the patient, or of later complication, without mention of misadventure at the time of the procedure: Secondary | ICD-10-CM | POA: Insufficient documentation

## 2013-04-08 DIAGNOSIS — R0683 Snoring: Secondary | ICD-10-CM

## 2013-04-08 DIAGNOSIS — I513 Intracardiac thrombosis, not elsewhere classified: Secondary | ICD-10-CM

## 2013-04-08 DIAGNOSIS — T82897A Other specified complication of cardiac prosthetic devices, implants and grafts, initial encounter: Secondary | ICD-10-CM | POA: Insufficient documentation

## 2013-04-08 DIAGNOSIS — Z7982 Long term (current) use of aspirin: Secondary | ICD-10-CM | POA: Insufficient documentation

## 2013-04-08 MED ORDER — ATORVASTATIN CALCIUM 80 MG PO TABS: 80.0000 mg | ORAL_TABLET | Freq: Every day | ORAL | Status: DC

## 2013-04-08 MED ORDER — ASPIRIN EC 81 MG PO TBEC: 81.0000 mg | DELAYED_RELEASE_TABLET | Freq: Every day | ORAL | Status: DC

## 2013-04-08 MED ORDER — CARVEDILOL 3.125 MG PO TABS: 3.1250 mg | ORAL_TABLET | Freq: Two times a day (BID) | ORAL | Status: DC

## 2013-04-08 MED ORDER — WARFARIN SODIUM 5 MG PO TABS: 5.0000 mg | ORAL_TABLET | ORAL | Status: DC

## 2013-04-08 MED ORDER — LISINOPRIL 2.5 MG PO TABS: 2.5000 mg | ORAL_TABLET | Freq: Every day | ORAL | Status: DC

## 2013-04-08 NOTE — Patient Instructions (Addendum)
Start taking lisinopril 2.5 mg daily. Will need to get labs checked in 7-10 days at [facility].   Continue taking atorvastatin 80 mg daily, coreg 3.125 mg twice a day and aspirin 81 mg daily.  Start Coumadin 5 mg daily, Will refer to [facility] office to start Coumadin. Need to follow up very important with coumadin dosing.  Will refer back to Dr. Graciela Husbands for ICD.  You have been referred to Pulmonary  F/U 1 month  Do the following things EVERYDAY: 1) Weigh yourself in the morning before breakfast. Write it down and keep it in a log. 2) Take your medicines as prescribed 3) Eat low salt foods-Limit salt (sodium) to 2000 mg per day.  4) Stay as active as you can everyday 5) Limit all fluids for the day to less than 2 liters 6)

## 2013-04-08 NOTE — Progress Notes (Signed)
  Echocardiogram 2D Echocardiogram has been performed.  Georgian Co 04/08/2013, 11:34 AM

## 2013-04-08 NOTE — Progress Notes (Signed)
Patient ID: Logan [name], male   DOB: [birth year], 37 y.o.   MRN: [id number] Cardiologist: Dr Elease Hashimoto  PCP: None   HPI:  Logan [name] is a 37 year old male with CAD s/p CABG x 3; LIMA-LAD, SVG -obtuse marginal, SVG-RCA (07/2012), ischemic cardiomyopathy- Hx of anterolateral MI with DES to LAD; reinfarction 02/05/12 while in [place], [place] with thrombosis of the stent and repeat stenting. Logan [name] also has a history of nicotine dependence, previous ETOH abuse, and non-compliance.  CPX Results 06/2012 Peak VO2: 21.9 % predicted peak VO2: 52.9% VE/VCO2 slope: 33.7 OUES: 1.53 Peak RER: 1.31 Ventilatory Threshold: 17.4 % predicted peak VO2: 42%   [facility] 07/2012 RA = 4  RV = 35/2/6  PA = 30/14 (20)  PCW = 15 v = 25  Fick cardiac output/index = 3.7/2.1  PVR = 1.4 Woods  FA sat = 97%  PA sat = 62%, 65%  Assessment:  1. 3 vessel disease with totally occluded RCA, severe disease in LCx, and 80% in-stent restenosis in LAD.  2. Low cardiac output, elevated filling pressures.   ECHO 04/2013: EF 20% with mural thrombus, grade I diastolic dysfunction, moderate RV dilation, moderate MR.   Follow up: Last visit started back coreg 3.125 mg BID, atorvastatin 80 mg daily and ASA 325 mg. Logan [name] is, however, not taking atorvastatin.  Logan [name] is taking medications as prescribed and wearing LifeVest except at night. Wife worried that Logan [name] may have some OSA, Logan [name] stops breathing at times. Has cut back on smoking to 1/2 ppd. Denies SOB, edema or CP. + DOE if Logan [name] walks up steps.  Logan [name] is ok on flat ground. Weight at home 150-155 lbs.  Logan [name] is not on coumadin (not sure why) and not on Brilinta.   ECG: NSR, old inferior MI, old ASMI, inferior and anterolateral TWIs, QRS 118 sec  SH: Married, lives in [place], on disability, smokes 1/2 ppd  FH: Brother with MI at 51, grandmother MI at 38, mother SCD  ROS: All systems negative except as listed in HPI, PMH and Problem List.  Past Medical History  Diagnosis Date  . GERD (gastroesophageal  reflux disease)   . CAD (coronary artery disease) 01/22/12    a.anterolateral MI with DES to LAD EF 25% b. reinfarction 8/13 with thrombosis of stent c.LIMA-LAD, SVG-OM, SVG-RCA (07/2012)   . Hyperlipidemia   . Tobacco abuse   . Chronic systolic CHF (congestive heart failure)     a. (07/2012) EF 20-25% diff HK with reg wall abnl, HK anterolat wall, akinesis inferosep, mod MR,   . Noncompliance   . Syncope   . LV (left ventricular) mural thrombus     "just found it in Oct" (07/09/2012)  . Ischemic cardiomyopathy   . Anginal pain   . Myocardial infarction 01/2012; 02/2012; 04/2012    Current Outpatient Prescriptions  Medication Sig Dispense Refill  . aspirin 325 MG EC tablet Take 1 tablet (325 mg total) by mouth daily.  30 tablet    . atorvastatin (LIPITOR) 80 MG tablet Take 1 tablet (80 mg total) by mouth daily at 6 PM.  30 tablet  6  . carvedilol (COREG) 3.125 MG tablet Take 1 tablet (3.125 mg total) by mouth 2 (two) times daily with a meal.  60 tablet  6   No current facility-administered medications for this encounter.    Filed Vitals:   04/08/13 1142  BP: 102/64  Pulse: 62  Weight: 152 lb 8 oz (69.174 kg)  SpO2: 100%    PHYSICAL EXAM:  General:   Fatigued appearing. No respiratory difficulty; GF present HEENT: normal Neck: supple. no JVD. Carotids 2+ bilat; no bruits. No lymphadenopathy or thryomegaly appreciated. Cor: PMI nondisplaced. Regular rate & rhythm. No rubs, gallops or murmurs. Midline incision approximated and healed Lungs: clear Abdomen: soft, nontender, nondistended. No hepatosplenomegaly. No bruits or masses. Good bowel sounds. Extremities: no cyanosis, clubbing, rash, trace edema Neuro: alert & oriented x 3, cranial nerves grossly intact. moves all 4 extremities w/o difficulty. Affect pleasant.  EKG: NSR 68 bpm  ASSESSMENT & PLAN:  1) Chronic systolic HF, EF 25%: Ischemic cardiomyopathy, not much improvement in LV function post-CABG. NYHA II-III symptoms.  Volume status stable. Logan [name] taking Coreg currently.  BP is on the lower side, no lightheadedness.  - Will start lisinopril at low dose, 2.5 mg daily.  BMET at followup in 1 month.  - Lengthy discussion with patient about his prognosis and his condition. Discussed the need to be compliant with medications and follow up. Discussed that at this time Logan [name] would not be considered a candidate for tx or advanced therapies d/t non-compliance and smoking. Logan [name] states Logan [name] understands. - EF 20% today post-CABG. Will refer back to Dr. Graciela Husbands for ICD implantation.  Continue LifeVest at this time 24/7.  Logan [name] does not qualify for CRT.  - Reinforced the need and importance of daily weights, a low sodium diet, and fluid restriction (less than 2 L a day). Instructed to call the HF clinic if weight increases more than 3 lbs overnight or 5 lbs in a week.   2) CAD, s/p CABG x 3: No ischemic symptoms. - Restart lipitor 80 mg daily and continue ASA 81 mg. I am not going to have him on Plavix as Logan [name] will be restarting coumadin (has history of ISR but now protected with LIMA-LAD).  - check lipids in 2 months.   3) Mural Thrombus - ECHO today showed mural thrombus, which Logan [name] has had a history of before. Will restart coumadin and refer to [facility] office to dose. I am not sure why Logan [name] has been off coumadin for so long.   4) OSA? - Patients wife reports Logan [name] stops breathing at night, will set up for sleep study.  F/U 1 month   Ulla Potash B NP-C 12:10 PM  Patient seen with NP, agree with the above note.   Logan [name] does not appear volume overloaded, no chest pain.  BP soft.   Echo reviewed, EF 20% with LV mural thrombus.   Long discussion about prognosis and need for followup/med compliance.   Continue Coreg, start lisinopril 2.5 mg daily.  Will restart atorvastatin.   Restart coumadin, to be followed at [facility] office.   Refer back to Dr. Graciela Husbands for ICD.  Will continue Lifevest until then.   Sleep study.   Marca Ancona 04/08/2013 3:23 PM

## 2013-04-09 NOTE — Addendum Note (Signed)
Encounter addended by: Deitra Mayo, CCT on: 04/09/2013 12:48 PM<BR>     Documentation filed: Charges VN

## 2013-04-11 ENCOUNTER — Other Ambulatory Visit (HOSPITAL_COMMUNITY): Payer: Self-pay | Admitting: *Deleted

## 2013-04-12 ENCOUNTER — Encounter: Payer: Self-pay | Admitting: *Deleted

## 2013-04-17 ENCOUNTER — Institutional Professional Consult (permissible substitution): Payer: Medicaid Other | Admitting: Internal Medicine

## 2013-04-25 ENCOUNTER — Encounter: Payer: Self-pay | Admitting: Internal Medicine

## 2013-05-09 ENCOUNTER — Inpatient Hospital Stay (HOSPITAL_COMMUNITY): Admission: RE | Admit: 2013-05-09 | Payer: Medicaid Other | Source: Ambulatory Visit

## 2013-05-16 ENCOUNTER — Institutional Professional Consult (permissible substitution): Payer: Medicaid Other | Admitting: Pulmonary Disease

## 2013-05-21 ENCOUNTER — Encounter: Payer: Self-pay | Admitting: Pulmonary Disease

## 2013-05-23 ENCOUNTER — Telehealth: Payer: Self-pay | Admitting: Internal Medicine

## 2013-05-23 NOTE — Telephone Encounter (Signed)
Logan [name] rep with [employer] called to say they still have not been abel to get in touch with pt and it appears that pt has not been wearing the Vest.

## 2013-05-28 NOTE — Telephone Encounter (Signed)
Late entry - Dr Graciela Husbands made aware yesterday.

## 2014-01-08 ENCOUNTER — Ambulatory Visit (HOSPITAL_COMMUNITY)
Admission: RE | Admit: 2014-01-08 | Discharge: 2014-01-08 | Disposition: A | Discharge status: Home / Self Care | Payer: Medicaid Other | Source: Ambulatory Visit | Attending: Internal Medicine | Admitting: Internal Medicine

## 2014-01-08 VITALS — BP 108/70 | HR 65 | Wt 157.5 lb

## 2014-01-08 DIAGNOSIS — I513 Intracardiac thrombosis, not elsewhere classified: Secondary | ICD-10-CM

## 2014-01-08 DIAGNOSIS — K219 Gastro-esophageal reflux disease without esophagitis: Secondary | ICD-10-CM | POA: Insufficient documentation

## 2014-01-08 DIAGNOSIS — I252 Old myocardial infarction: Secondary | ICD-10-CM | POA: Insufficient documentation

## 2014-01-08 DIAGNOSIS — Z951 Presence of aortocoronary bypass graft: Secondary | ICD-10-CM | POA: Diagnosis not present

## 2014-01-08 DIAGNOSIS — I509 Heart failure, unspecified: Secondary | ICD-10-CM | POA: Insufficient documentation

## 2014-01-08 DIAGNOSIS — E785 Hyperlipidemia, unspecified: Secondary | ICD-10-CM | POA: Diagnosis not present

## 2014-01-08 DIAGNOSIS — I219 Acute myocardial infarction, unspecified: Secondary | ICD-10-CM | POA: Diagnosis not present

## 2014-01-08 DIAGNOSIS — Z91199 Patient's noncompliance with other medical treatment and regimen due to unspecified reason: Secondary | ICD-10-CM | POA: Diagnosis not present

## 2014-01-08 DIAGNOSIS — I209 Angina pectoris, unspecified: Secondary | ICD-10-CM | POA: Diagnosis not present

## 2014-01-08 DIAGNOSIS — F1010 Alcohol abuse, uncomplicated: Secondary | ICD-10-CM | POA: Diagnosis not present

## 2014-01-08 DIAGNOSIS — F17200 Nicotine dependence, unspecified, uncomplicated: Secondary | ICD-10-CM | POA: Diagnosis not present

## 2014-01-08 DIAGNOSIS — Z9119 Patient's noncompliance with other medical treatment and regimen: Secondary | ICD-10-CM | POA: Diagnosis not present

## 2014-01-08 DIAGNOSIS — I2510 Atherosclerotic heart disease of native coronary artery without angina pectoris: Secondary | ICD-10-CM | POA: Insufficient documentation

## 2014-01-08 DIAGNOSIS — I5022 Chronic systolic (congestive) heart failure: Secondary | ICD-10-CM | POA: Diagnosis not present

## 2014-01-08 DIAGNOSIS — I428 Other cardiomyopathies: Secondary | ICD-10-CM | POA: Insufficient documentation

## 2014-01-08 DIAGNOSIS — Z720 Tobacco use: Secondary | ICD-10-CM

## 2014-01-08 DIAGNOSIS — Z8249 Family history of ischemic heart disease and other diseases of the circulatory system: Secondary | ICD-10-CM | POA: Diagnosis not present

## 2014-01-08 LAB — CBC WITH DIFFERENTIAL/PLATELET
Basophils Absolute: 0 10*3/uL (ref 0.0–0.1)
Basophils Relative: 0 % (ref 0–1)
Eosinophils Absolute: 0.1 10*3/uL (ref 0.0–0.7)
Eosinophils Relative: 2 % (ref 0–5)
HCT: 42.6 % (ref 39.0–52.0)
HEMOGLOBIN: 14.9 g/dL (ref 13.0–17.0)
LYMPHS ABS: 3 10*3/uL (ref 0.7–4.0)
LYMPHS PCT: 40 % (ref 12–46)
MCH: 34.1 pg — ABNORMAL HIGH (ref 26.0–34.0)
MCHC: 35 g/dL (ref 30.0–36.0)
MCV: 97.5 fL (ref 78.0–100.0)
MONO ABS: 0.5 10*3/uL (ref 0.1–1.0)
MONOS PCT: 7 % (ref 3–12)
NEUTROS ABS: 3.9 10*3/uL (ref 1.7–7.7)
NEUTROS PCT: 51 % (ref 43–77)
Platelets: 255 10*3/uL (ref 150–400)
RBC: 4.37 MIL/uL (ref 4.22–5.81)
RDW: 12.7 % (ref 11.5–15.5)
WBC: 7.6 10*3/uL (ref 4.0–10.5)

## 2014-01-08 LAB — BASIC METABOLIC PANEL
Anion gap: 14 (ref 5–15)
BUN: 18 mg/dL (ref 6–23)
CHLORIDE: 101 meq/L (ref 96–112)
CO2: 24 meq/L (ref 19–32)
CREATININE: 0.87 mg/dL (ref 0.50–1.35)
Calcium: 9.4 mg/dL (ref 8.4–10.5)
GFR calc Af Amer: >90 mL/min (ref >90)
GFR calc non Af Amer: >90 mL/min (ref >90)
GLUCOSE: 83 mg/dL (ref 70–99)
POTASSIUM: 4.4 meq/L (ref 3.7–5.3)
Sodium: 139 mEq/L (ref 137–147)

## 2014-01-08 MED ORDER — CARVEDILOL 6.25 MG PO TABS
6.2500 mg | ORAL_TABLET | Freq: Two times a day (BID) | ORAL | Status: DC
Start: 2014-01-08 — End: 2014-03-11

## 2014-01-08 MED ORDER — LISINOPRIL 5 MG PO TABS: 5.0000 mg | ORAL_TABLET | Freq: Every day | ORAL | Status: DC

## 2014-01-08 NOTE — Patient Instructions (Signed)
STOP Coumadin INCREASE Carvedilol to 6.25 mg twice a day INCREASE Lisinopril to 5 mg daily  Labs today  Your physician has requested that you have an echocardiogram. Echocardiography is a painless test that uses sound waves to create images of your heart. It provides your doctor with information about the size and shape of your heart and how well your heart's chambers and valves are working. This procedure takes approximately one hour. There are no restrictions for this procedure.  Your physician recommends that you schedule a follow-up appointment in: 4 weeks  Do the following things EVERYDAY: 1) Weigh yourself in the morning before breakfast. Write it down and keep it in a log. 2) Take your medicines as prescribed 3) Eat low salt foods-Limit salt (sodium) to 2000 mg per day.  4) Stay as active as you can everyday 5) Limit all fluids for the day to less than 2 liters 6)

## 2014-01-08 NOTE — Progress Notes (Signed)
Patient ID: Logan [name], male   DOB: 05/25/1976, 38 y.o.   MRN: [id number] Cardiologist: Dr Elease HashimotoNahser  PCP: None   HPI:  Logan [name] is a 38 year old male with CAD s/p anterolateral MI in 7/13 with DES to LAD; reinfar[place] 02/05/12 [place] due to stent thrombosis underwent repeat stenting. Then s/p CABG x 3; LIMA-LAD, SVG -obtuse marginal, SVG-RCA (07/2012), ischemic cardiomyopathy (EF 15% echo 10/14) - He also has a history of nicotine dependence, previous ETOH abuse, and non-compliance.  CPX Results 06/2012 Peak VO2: 21.9 % predicted peak VO2: 52.9% VE/VCO2 slope: 33.7 OUES: 1.53 Peak RER: 1.31 Ventilatory Threshold: 17.4 % predicted peak VO2: 42%   L/RHC 07/2012 RA = 4  RV = 35/2/6  PA = 30/14 (20)  PCW = 15 v = 25  Fick cardiac output/index = 3.7/2.1  PVR = 1.4 Woods  FA sat = 97%  PA sat = 62%, 65%  Assessment:  1. 3 vessel disease with totally occluded RCA, severe disease in LCx, and 80% in-stent restenosis in LAD.  2. Low cardiac output, elevated filling pressures.   ECHO 04/2013: EF 15% with mural thrombus, grade I diastolic dysfunction, moderate RV dilation, moderate MR.   Follow up for Heart Failure: Patient has no showed to many appointments and has not been seen since 04/2013. Says he is feeling ok. Sluggish at times. Feels ok on flat ground but SOB walking up hills or steps. No edema. Has not taken a diuretic. Weighs every day 155-160. Feels dizzy sometimes if he looks up quickly.  Reports taking all his meds including coumadin but hasn't had INR checked in over a year. Turned LifeVest back in. Smoking 1ppd. Usually drinking a 40 oz beer a few times a week and an occasional 12-pack.    ECG: NSR, old inferior MI, old ASMI, inferior and anterolateral TWIs, QRS 118 sec  SH: Married, lives in [place], on disability, smokes 1/2 ppd  FH: Brother with MI at 2232, grandmother MI at 255, mother SCD  ROS: All systems negative except as listed in HPI, PMH and Problem List.  Past  Medical History  Diagnosis Date  . GERD (gastroesophageal reflux disease)   . CAD (coronary artery disease) 01/22/12    a.anterolateral MI with DES to LAD EF 25% b. reinfarction 8/13 with thrombosis of stent c.LIMA-LAD, SVG-OM, SVG-RCA (07/2012)   . Hyperlipidemia   . Tobacco abuse   . Chronic systolic CHF (congestive heart failure)     a. (07/2012) EF 20-25% diff HK with reg wall abnl, HK anterolat wall, akinesis inferosep, mod MR,   . Noncompliance   . Syncope   . LV (left ventricular) mural thrombus     "just found it in Oct" (07/09/2012)  . Ischemic cardiomyopathy   . Anginal pain   . Myocardial infarction 01/2012; 02/2012; 04/2012    Current Outpatient Prescriptions  Medication Sig Dispense Refill  . aspirin EC 81 MG tablet Take 1 tablet (81 mg total) by mouth daily.  30 tablet  6  . atorvastatin (LIPITOR) 80 MG tablet Take 1 tablet (80 mg total) by mouth daily at 6 PM.  30 tablet  6  . carvedilol (COREG) 6.25 MG tablet Take 1 tablet (6.25 mg total) by mouth 2 (two) times daily with a meal.  60 tablet  6  . lisinopril (ZESTRIL) 5 MG tablet Take 1 tablet (5 mg total) by mouth daily.  30 tablet  6   No current facility-administered medications for this encounter.  Filed Vitals:   01/08/14 1416  BP: 108/70  Pulse: 65  Weight: 157 lb 8 oz (71.442 kg)  SpO2: 97%    PHYSICAL EXAM: General:   Well appearing. No respiratory difficulty; GF present HEENT: normal Neck: supple. no JVD. Carotids 2+ bilat; no bruits. No lymphadenopathy or thryomegaly appreciated. Cor: PMI nondisplaced. Regular rate & rhythm. No rubs, gallops or murmurs. Midline incision approximated and healed Lungs: clear Abdomen: soft, nontender, nondistended. No hepatosplenomegaly. No bruits or masses. Good bowel sounds. Extremities: no cyanosis, clubbing, rash, no edema. L radial scar Neuro: alert & oriented x 3, cranial nerves grossly intact. moves all 4 extremities w/o difficulty. Affect pleasant.   ASSESSMENT  & PLAN:  1) Chronic systolic HF, EF 15%: - Chronic NYHA III. Volume status ok off all diuretics  - Increase carvedilol to 6.25 bid and lisinopril to 5 daily - BMET and CBC today - Lengthy discussion with patient about his prognosis and his condition. Discussed the need to be compliant with medications and follow up. Discussed that at this time he would not be considered a candidate for tx or advanced therapies d/t non-compliance and smoking. He states he understands. - Refuses LifeVest.  - Check echo at [facility] if EF < 35% refer for ICD  2) CAD, s/p CABG x 3: No ischemic symptoms. - Continue lipitor 80 mg daily and continue ASA 81 mg.  - PCP to follow lipids and LFTs.  3) Mural Thrombus - He is taking coumadin but not getting INR checks. He has been on greater than 6 months. Will stop  4) Tobacco and ETOH abuse - Long discussion about need to stop   F/U 1 month  Carmelo Reidel,MD 3:08 PM

## 2014-01-10 ENCOUNTER — Emergency Department (HOSPITAL_COMMUNITY)
Admission: EM | Admit: 2014-01-10 | Discharge: 2014-01-10 | Disposition: A | Discharge status: Home / Self Care | Payer: Medicaid Other | Attending: Emergency Medicine | Admitting: Emergency Medicine

## 2014-01-10 ENCOUNTER — Encounter (HOSPITAL_COMMUNITY): Payer: Self-pay | Admitting: Emergency Medicine

## 2014-01-10 DIAGNOSIS — F17200 Nicotine dependence, unspecified, uncomplicated: Secondary | ICD-10-CM | POA: Diagnosis not present

## 2014-01-10 DIAGNOSIS — Z880 Allergy status to penicillin: Secondary | ICD-10-CM | POA: Diagnosis not present

## 2014-01-10 DIAGNOSIS — Z8719 Personal history of other diseases of the digestive system: Secondary | ICD-10-CM | POA: Insufficient documentation

## 2014-01-10 DIAGNOSIS — K029 Dental caries, unspecified: Secondary | ICD-10-CM | POA: Diagnosis not present

## 2014-01-10 DIAGNOSIS — Z86718 Personal history of other venous thrombosis and embolism: Secondary | ICD-10-CM | POA: Insufficient documentation

## 2014-01-10 DIAGNOSIS — K006 Disturbances in tooth eruption: Secondary | ICD-10-CM | POA: Insufficient documentation

## 2014-01-10 DIAGNOSIS — Z951 Presence of aortocoronary bypass graft: Secondary | ICD-10-CM | POA: Insufficient documentation

## 2014-01-10 DIAGNOSIS — Z9119 Patient's noncompliance with other medical treatment and regimen: Secondary | ICD-10-CM | POA: Diagnosis not present

## 2014-01-10 DIAGNOSIS — Z9861 Coronary angioplasty status: Secondary | ICD-10-CM | POA: Insufficient documentation

## 2014-01-10 DIAGNOSIS — K044 Acute apical periodontitis of pulpal origin: Secondary | ICD-10-CM | POA: Diagnosis not present

## 2014-01-10 DIAGNOSIS — I209 Angina pectoris, unspecified: Secondary | ICD-10-CM | POA: Insufficient documentation

## 2014-01-10 DIAGNOSIS — I252 Old myocardial infarction: Secondary | ICD-10-CM | POA: Insufficient documentation

## 2014-01-10 DIAGNOSIS — Z7982 Long term (current) use of aspirin: Secondary | ICD-10-CM | POA: Insufficient documentation

## 2014-01-10 DIAGNOSIS — K047 Periapical abscess without sinus: Secondary | ICD-10-CM

## 2014-01-10 DIAGNOSIS — I2510 Atherosclerotic heart disease of native coronary artery without angina pectoris: Secondary | ICD-10-CM | POA: Diagnosis not present

## 2014-01-10 DIAGNOSIS — I5022 Chronic systolic (congestive) heart failure: Secondary | ICD-10-CM | POA: Diagnosis not present

## 2014-01-10 DIAGNOSIS — Z91199 Patient's noncompliance with other medical treatment and regimen due to unspecified reason: Secondary | ICD-10-CM | POA: Insufficient documentation

## 2014-01-10 DIAGNOSIS — E785 Hyperlipidemia, unspecified: Secondary | ICD-10-CM | POA: Diagnosis not present

## 2014-01-10 DIAGNOSIS — K089 Disorder of teeth and supporting structures, unspecified: Secondary | ICD-10-CM | POA: Diagnosis present

## 2014-01-10 DIAGNOSIS — Z79899 Other long term (current) drug therapy: Secondary | ICD-10-CM | POA: Diagnosis not present

## 2014-01-10 MED ORDER — HYDROCODONE-ACETAMINOPHEN 5-325 MG PO TABS
1.0000 | ORAL_TABLET | Freq: Once | ORAL | Status: AC
  Administered 2014-01-10: 1 via ORAL
  Filled 2014-01-10: qty 1

## 2014-01-10 MED ORDER — CLINDAMYCIN HCL 150 MG PO CAPS: 150.0000 mg | ORAL_CAPSULE | Freq: Four times a day (QID) | ORAL | Status: DC

## 2014-01-10 MED ORDER — HYDROCODONE-ACETAMINOPHEN 5-325 MG PO TABS: 1.0000 | ORAL_TABLET | ORAL | Status: DC | PRN

## 2014-01-10 NOTE — ED Notes (Signed)
Dental pain for months. States he can't find a Education officer, community that takes [employer]

## 2014-01-10 NOTE — ED Notes (Signed)
Alert,  Dental pain rt molar.  For "weeks"

## 2014-01-10 NOTE — Discharge Instructions (Signed)
Dental Pain Toothache is pain in or around a tooth. It may get worse with chewing or with cold or heat.  HOME CARE  Your dentist may use a numbing medicine during treatment. If so, you may need to avoid eating until the medicine wears off. Ask your dentist about this.  Only take medicine as told by your dentist or doctor.  Avoid chewing food near the painful tooth until after all treatment is done. Ask your dentist about this. GET HELP RIGHT AWAY IF:   The problem gets worse or new problems appear.  You have a fever.  There is redness and puffiness (swelling) of the face, jaw, or neck.  You cannot open your mouth.  There is pain in the jaw.  There is very bad pain that is not helped by medicine. MAKE SURE YOU:   Understand these instructions.  Will watch your condition.  Will get help right away if you are not doing well or get worse. Document Released: 12/07/2007 Document Revised: 09/12/2011 Document Reviewed: 12/07/2007 [facility] Patient Information 2015 [place], [place]. This information is not intended to replace advice given to you by your health care provider. Make sure you discuss any questions you have with your health care provider.   Complete your entire course of antibiotics as prescribed.  You  may use the hydrocodone for pain relief but do not drive within 4 hours of taking as this will make you drowsy.  Avoid applying heat or ice to this abscess area which can worsen your symptoms.  You may use warm salt water swish and spit treatment or half peroxide and water swish and spit after meals to keep this area clean as discussed.  Call the dentist listed above for further management of your symptoms.

## 2014-01-13 ENCOUNTER — Ambulatory Visit (HOSPITAL_COMMUNITY)
Admission: RE | Admit: 2014-01-13 | Discharge: 2014-01-13 | Disposition: A | Discharge status: Home / Self Care | Payer: Medicaid Other | Source: Ambulatory Visit | Attending: Internal Medicine | Admitting: Internal Medicine

## 2014-01-13 ENCOUNTER — Telehealth: Payer: Self-pay | Admitting: Licensed Clinical Social Worker

## 2014-01-13 DIAGNOSIS — F17200 Nicotine dependence, unspecified, uncomplicated: Secondary | ICD-10-CM | POA: Insufficient documentation

## 2014-01-13 DIAGNOSIS — I2589 Other forms of chronic ischemic heart disease: Secondary | ICD-10-CM | POA: Diagnosis not present

## 2014-01-13 DIAGNOSIS — I2510 Atherosclerotic heart disease of native coronary artery without angina pectoris: Secondary | ICD-10-CM | POA: Diagnosis not present

## 2014-01-13 DIAGNOSIS — I509 Heart failure, unspecified: Secondary | ICD-10-CM | POA: Insufficient documentation

## 2014-01-13 DIAGNOSIS — Z951 Presence of aortocoronary bypass graft: Secondary | ICD-10-CM | POA: Insufficient documentation

## 2014-01-13 DIAGNOSIS — I059 Rheumatic mitral valve disease, unspecified: Secondary | ICD-10-CM

## 2014-01-13 DIAGNOSIS — E785 Hyperlipidemia, unspecified: Secondary | ICD-10-CM | POA: Diagnosis not present

## 2014-01-13 DIAGNOSIS — I5022 Chronic systolic (congestive) heart failure: Secondary | ICD-10-CM

## 2014-01-13 DIAGNOSIS — I079 Rheumatic tricuspid valve disease, unspecified: Secondary | ICD-10-CM | POA: Diagnosis not present

## 2014-01-13 NOTE — Telephone Encounter (Signed)
CSW referred to assist with questions regarding medicaid coverage of medications. CSW spoke with wife as patient was unavailable at the time. Wife states that initially they had some challenges with coverage but seem to have resolved the issue with the pharmacy. She confirmed patient has all needed medications at the moment and denies any concerns. CSW offered support and available if further needs arise. Lasandra Beech. LCSW [phone]

## 2014-01-13 NOTE — Progress Notes (Signed)
  Echocardiogram 2D Echocardiogram has been performed.  Logan [name] 01/13/2014, 10:27 AM
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
## 2014-01-16 NOTE — ED Provider Notes (Signed)
Medical screening examination/treatment/procedure(s) were performed by non-physician practitioner and as supervising physician I was immediately available for consultation/collaboration.   EKG Interpretation None       Donnetta Hutching, MD 01/16/14 [phone]

## 2014-02-06 ENCOUNTER — Ambulatory Visit (HOSPITAL_COMMUNITY)
Admission: RE | Admit: 2014-02-06 | Discharge: 2014-02-06 | Disposition: A | Discharge status: Home / Self Care | Payer: Medicaid Other | Source: Ambulatory Visit | Attending: Cardiology | Admitting: Cardiology

## 2014-02-06 VITALS — BP 104/70 | HR 64 | Wt 160.5 lb

## 2014-02-06 DIAGNOSIS — I2589 Other forms of chronic ischemic heart disease: Secondary | ICD-10-CM | POA: Insufficient documentation

## 2014-02-06 DIAGNOSIS — Z7982 Long term (current) use of aspirin: Secondary | ICD-10-CM | POA: Diagnosis not present

## 2014-02-06 DIAGNOSIS — F17200 Nicotine dependence, unspecified, uncomplicated: Secondary | ICD-10-CM | POA: Diagnosis not present

## 2014-02-06 DIAGNOSIS — Z91199 Patient's noncompliance with other medical treatment and regimen due to unspecified reason: Secondary | ICD-10-CM | POA: Diagnosis not present

## 2014-02-06 DIAGNOSIS — I2584 Coronary atherosclerosis due to calcified coronary lesion: Secondary | ICD-10-CM | POA: Diagnosis not present

## 2014-02-06 DIAGNOSIS — R0609 Other forms of dyspnea: Secondary | ICD-10-CM

## 2014-02-06 DIAGNOSIS — I252 Old myocardial infarction: Secondary | ICD-10-CM | POA: Diagnosis not present

## 2014-02-06 DIAGNOSIS — R0683 Snoring: Secondary | ICD-10-CM

## 2014-02-06 DIAGNOSIS — I219 Acute myocardial infarction, unspecified: Secondary | ICD-10-CM | POA: Diagnosis not present

## 2014-02-06 DIAGNOSIS — Z9119 Patient's noncompliance with other medical treatment and regimen: Secondary | ICD-10-CM | POA: Insufficient documentation

## 2014-02-06 DIAGNOSIS — I5022 Chronic systolic (congestive) heart failure: Secondary | ICD-10-CM | POA: Diagnosis not present

## 2014-02-06 DIAGNOSIS — Z951 Presence of aortocoronary bypass graft: Secondary | ICD-10-CM | POA: Insufficient documentation

## 2014-02-06 DIAGNOSIS — I513 Intracardiac thrombosis, not elsewhere classified: Secondary | ICD-10-CM

## 2014-02-06 DIAGNOSIS — K219 Gastro-esophageal reflux disease without esophagitis: Secondary | ICD-10-CM | POA: Insufficient documentation

## 2014-02-06 DIAGNOSIS — F1011 Alcohol abuse, in remission: Secondary | ICD-10-CM | POA: Diagnosis not present

## 2014-02-06 DIAGNOSIS — G4733 Obstructive sleep apnea (adult) (pediatric): Secondary | ICD-10-CM | POA: Diagnosis not present

## 2014-02-06 DIAGNOSIS — I2510 Atherosclerotic heart disease of native coronary artery without angina pectoris: Secondary | ICD-10-CM

## 2014-02-06 DIAGNOSIS — E785 Hyperlipidemia, unspecified: Secondary | ICD-10-CM | POA: Diagnosis not present

## 2014-02-06 DIAGNOSIS — R0989 Other specified symptoms and signs involving the circulatory and respiratory systems: Secondary | ICD-10-CM

## 2014-02-06 DIAGNOSIS — I509 Heart failure, unspecified: Secondary | ICD-10-CM

## 2014-02-06 DIAGNOSIS — Z720 Tobacco use: Secondary | ICD-10-CM

## 2014-02-06 LAB — LIPID PANEL
CHOL/HDL RATIO: 4.6 ratio
Cholesterol: 232 mg/dL — ABNORMAL HIGH (ref 0–200)
HDL: 50 mg/dL (ref >39)
LDL CALC: 158 mg/dL — AB (ref 0–99)
TRIGLYCERIDES: 119 mg/dL (ref <150)
VLDL: 24 mg/dL (ref 0–40)

## 2014-02-06 MED ORDER — WARFARIN SODIUM 5 MG PO TABS: 5.0000 mg | ORAL_TABLET | ORAL | Status: DC

## 2014-02-06 MED ORDER — FUROSEMIDE 20 MG PO TABS: 20.0000 mg | ORAL_TABLET | Freq: Every day | ORAL | Status: DC

## 2014-02-06 MED ORDER — SPIRONOLACTONE 25 MG PO TABS
12.5000 mg | ORAL_TABLET | Freq: Every day | ORAL | Status: DC
Start: 2014-02-06 — End: 2014-02-06

## 2014-02-06 MED ORDER — SPIRONOLACTONE 25 MG PO TABS: 12.5000 mg | ORAL_TABLET | Freq: Every day | ORAL | Status: DC

## 2014-02-06 NOTE — Patient Instructions (Signed)
Start Furosemide (Lasix) 20 mg daily  Start Spironolactone 12.5 mg (1/2 tab) daily  Start Coumadin 5 mg daily, follow up with the [facility] in the [place] Office Monday 8/10 at 10 am  Labs today and in 10 days (02/17/14) in [place]   Your physician has recommended that you have a sleep study. This test records several body functions during sleep, including: brain activity, eye movement, oxygen and carbon dioxide blood levels, heart rate and rhythm, breathing rate and rhythm, the flow of air through your mouth and nose, snoring, body muscle movements, and chest and belly movement.  You have been referred to EP for defibrillator  Your physician recommends that you schedule a follow-up appointment in: 1 month

## 2014-02-06 NOTE — Progress Notes (Signed)
Patient ID: Logan [name], male   DOB: 04/29/1976, 38 y.o.   MRN: [id number] Cardiologist: Dr Elease HashimotoNahser  PCP: None   HPI:  Logan [name] is a 38 year old male with CAD s/p CABG x 3; LIMA-LAD, SVG -obtuse marginal, SVG-RCA (07/2012), ischemic cardiomyopathy- Hx of anterolateral MI with DES to LAD; reinfarction 02/05/12 [place] in [place] with thrombosis of the stent and repeat stenting. He also has a history of nicotine dependence, previous ETOH abuse, and non-compliance.  CPX Results 06/2012 Peak VO2: 21.9 % predicted peak VO2: 52.9% VE/VCO2 slope: 33.7 OUES: 1.53 Peak RER: 1.31 Ventilatory Threshold: 17.4 % predicted peak VO2: 42%   L/RHC 07/2012 (Followed by CABG) RA = 4  RV = 35/2/6  PA = 30/14 (20)  PCW = 15 v = 25  Fick cardiac output/index = 3.7/2.1  PVR = 1.4 Woods  FA sat = 97%  PA sat = 62%, 65%  Assessment:  1. 3 vessel disease with totally occluded RCA, severe disease in LCx, and 80% in-stent restenosis in LAD.  2. Low cardiac output, elevated filling pressures.   ECHO 04/2013: EF 20% with mural thrombus, grade I diastolic dysfunction, moderate RV dilation, moderate MR.   Limited ECHO (7/15): moderate LV dilation, EF 10-15%, restrictive diastolic function, there was an apical thrombus noted, moderate functional MR, moderately decreased RV systolic function.   Follow up: Patient is taking his medications.  He has stable to mildly progressive symptoms.  He is short of breath walking up a hill but is ok walking on flat ground.  No orthopnea or PND.  No chest pain. Dyspnea with moderately exertional activity (yardwork, etc).  No stroke-like symptoms.  Wife says that he stops breathing at night at times.  He is not taking coumadin currently.  He continues to smoke 1 ppd and continues to drink ETOH, heavily at times.  Weight is up 3 lbs.   ECG: NSR, old inferior MI, old anterior MI, inferior and anterolateral TWIs, QRS 124 msec  Labs (7/15): K 4.4, creatinine 0.87  SH: Married,  lives in [place], 2 kids, on disability, smokes 1 ppd, +ETOH  FH: Brother with MI at 4832, grandmother MI at 4355, mother SCD  ROS: All systems negative except as listed in HPI, PMH and Problem List.  Past Medical History  Diagnosis Date  . GERD (gastroesophageal reflux disease)   . CAD (coronary artery disease) 01/22/12    a.anterolateral MI with DES to LAD EF 25% b. reinfarction 8/13 with thrombosis of stent c.LIMA-LAD, SVG-OM, SVG-RCA (07/2012)   . Hyperlipidemia   . Tobacco abuse   . Chronic systolic CHF (congestive heart failure)     a. (07/2012) EF 20-25% diff HK with reg wall abnl, HK anterolat wall, akinesis inferosep, mod MR,   . Noncompliance   . Syncope   . LV (left ventricular) mural thrombus     "just found it in Oct" (07/09/2012)  . Ischemic cardiomyopathy   . Anginal pain   . Myocardial infarction 01/2012; 02/2012; 04/2012    Current Outpatient Prescriptions  Medication Sig Dispense Refill  . aspirin EC 81 MG tablet Take 1 tablet (81 mg total) by mouth daily.  30 tablet  6  . carvedilol (COREG) 6.25 MG tablet Take 1 tablet (6.25 mg total) by mouth 2 (two) times daily with a meal.  60 tablet  6  . clindamycin (CLEOCIN) 150 MG capsule Take 1 capsule (150 mg total) by mouth every 6 (six) hours.  28 capsule  0  . ibuprofen (  ADVIL,MOTRIN) 200 MG tablet Take 1,200-1,600 mg by mouth every 8 (eight) hours as needed for moderate pain.      Marland Kitchen lisinopril (ZESTRIL) 5 MG tablet Take 1 tablet (5 mg total) by mouth daily.  30 tablet  6  . simvastatin (ZOCOR) 40 MG tablet Take 40 mg by mouth daily.      . furosemide (LASIX) 20 MG tablet Take 1 tablet (20 mg total) by mouth daily.  30 tablet  3  . spironolactone (ALDACTONE) 25 MG tablet Take 0.5 tablets (12.5 mg total) by mouth daily.  15 tablet  3  . warfarin (COUMADIN) 5 MG tablet Take 1 tablet (5 mg total) by mouth as directed.  30 tablet  0   No current facility-administered medications for this encounter.    Filed Vitals:    02/06/14 0958  BP: 104/70  Pulse: 64  Weight: 160 lb 8 oz (72.802 kg)  SpO2: 100%    PHYSICAL EXAM: General:   Fatigued appearing. No respiratory difficulty; GF present HEENT: normal Neck: supple. JVP 8 cm. Carotids 2+ bilat; no bruits. No lymphadenopathy or thryomegaly appreciated. Cor: PMI nondisplaced. Regular rate & rhythm. No rubs, gallops or murmurs. Midline incision approximated and healed Lungs: prolonged expiratory phase.  Abdomen: soft, nontender, nondistended. No hepatosplenomegaly. No bruits or masses. Good bowel sounds. Extremities: no cyanosis, clubbing, rash, no edema Neuro: alert & oriented x 3, cranial nerves grossly intact. moves all 4 extremities w/o difficulty. Affect pleasant.  ASSESSMENT & PLAN:  1) Chronic systolic HF: Echo (7/15) with EF 10-15%. Ischemic cardiomyopathy, not much improvement in LV function post-CABG. NYHA II-III symptoms. He may be mildly volume overloaded with some weight gain.  Compliance seems to have improved.  - Start Lasix 20 mg daily.  - Continue current Coreg and lisinopril.  - Add spironolactone 12.5 mg daily. - BMET/BNP in 10 days.  - Refer to EP for ICD (narrow QRS, not CRT candidate).  - Long-term, needs to stop smoking and should cut back on ETOH to be candidate for advanced therapies.  We discussed this today.  He seems motivated.  2) CAD: s/p CABG x 3 in 1/4. No chest pain.  He is on simvastatin and ASA 81.  With LV thrombus still present, will restart coumadin.  Check lipids today, goal LDL < 70.  3) Mural Thrombus: Noted again on most recent echo.  Long talk about compliance with coumadin INR checks.  He is willing to do this.  I will have him follow at the [facility] office for INRs and will start coumadin today.   4) OSA:  Patient's wife reports he stops breathing at night, will set up for sleep study.  Marca Ancona 02/06/2014 4:52 PM

## 2014-02-08 NOTE — Addendum Note (Signed)
Encounter addended by: Simon Rhein, CCT on: 02/08/2014 12:35 PM<BR>     Documentation filed: Charges VN

## 2014-02-10 ENCOUNTER — Ambulatory Visit (INDEPENDENT_AMBULATORY_CARE_PROVIDER_SITE_OTHER): Payer: Medicaid Other | Admitting: *Deleted

## 2014-02-10 DIAGNOSIS — I513 Intracardiac thrombosis, not elsewhere classified: Secondary | ICD-10-CM

## 2014-02-10 DIAGNOSIS — I219 Acute myocardial infarction, unspecified: Secondary | ICD-10-CM

## 2014-02-10 LAB — POCT INR: INR: 1.5

## 2014-02-11 ENCOUNTER — Telehealth (HOSPITAL_COMMUNITY): Payer: Self-pay | Admitting: *Deleted

## 2014-02-11 MED ORDER — ATORVASTATIN CALCIUM 40 MG PO TABS: 40.0000 mg | ORAL_TABLET | Freq: Every day | ORAL | Status: DC

## 2014-02-11 NOTE — Telephone Encounter (Signed)
Pt aware new rx sent in. 

## 2014-02-11 NOTE — Telephone Encounter (Signed)
Message copied by Noralee Space on Tue Feb 11, 2014 11:12 AM ------      Message from: Laurey Morale      Created: Mon Feb 10, 2014 12:11 AM       Very high LDL, switch to atorvastatin 40 mg daily with lipids/LFTs in 2 months ------

## 2014-02-12 ENCOUNTER — Telehealth (HOSPITAL_COMMUNITY): Payer: Self-pay | Admitting: Cardiology

## 2014-02-12 NOTE — Telephone Encounter (Signed)
pts wife called and requested another rx for cholesterol States they are having a hard time with medicaid and atorvastation copay is $30 Med is no longer $4.oo Please change to something cheaper

## 2014-02-13 NOTE — Telephone Encounter (Signed)
I thought he was on simvastatin.   Can he get Crestor via Medicaid? I thought that this was on Medicaid formulary.  If so, start 20 mg daily.  If not, can take simvastatin 40 mg daily

## 2014-02-17 ENCOUNTER — Ambulatory Visit (INDEPENDENT_AMBULATORY_CARE_PROVIDER_SITE_OTHER): Payer: Medicaid Other | Admitting: *Deleted

## 2014-02-17 ENCOUNTER — Ambulatory Visit: Payer: Medicaid Other | Attending: Neurology | Admitting: Sleep Medicine

## 2014-02-17 VITALS — Ht 68.0 in | Wt 160.0 lb

## 2014-02-17 DIAGNOSIS — G4752 REM sleep behavior disorder: Secondary | ICD-10-CM | POA: Diagnosis not present

## 2014-02-17 DIAGNOSIS — R0609 Other forms of dyspnea: Secondary | ICD-10-CM | POA: Diagnosis present

## 2014-02-17 DIAGNOSIS — R0683 Snoring: Secondary | ICD-10-CM

## 2014-02-17 DIAGNOSIS — I513 Intracardiac thrombosis, not elsewhere classified: Secondary | ICD-10-CM

## 2014-02-17 DIAGNOSIS — I219 Acute myocardial infarction, unspecified: Secondary | ICD-10-CM

## 2014-02-17 DIAGNOSIS — Z5181 Encounter for therapeutic drug level monitoring: Secondary | ICD-10-CM | POA: Insufficient documentation

## 2014-02-17 LAB — POCT INR: INR: 2

## 2014-02-24 NOTE — Sleep Study (Signed)
  [facility] Leodis Alcocer A. Gerilyn Pilgrim, MD     [email or web address]        NOCTURNAL POLYSOMNOGRAM    LOCATION: SLEEP LAB FACILITY: [facility]   PHYSICIAN: Shigeo Baugh A. Gerilyn Pilgrim, M.D.   DATE OF STUDY: 02/17/2014.   REFERRING PHYSICIAN: Parke Simmers.  INDICATIONS: This is a 38 year old who presents with snoring, hypersomnia, difficulty sleeping and fatigue.  MEDICATIONS:  Prior to Admission medications   Medication Sig Start Date End Date Taking? Authorizing Provider  aspirin EC 81 MG tablet Take 1 tablet (81 mg total) by mouth daily. 04/08/13   Aundria Rud, NP  atorvastatin (LIPITOR) 40 MG tablet Take 1 tablet (40 mg total) by mouth daily. 02/11/14   Laurey Morale, MD  carvedilol (COREG) 6.25 MG tablet Take 1 tablet (6.25 mg total) by mouth 2 (two) times daily with a meal. 01/08/14   Dolores Patty, MD  clindamycin (CLEOCIN) 150 MG capsule Take 1 capsule (150 mg total) by mouth every 6 (six) hours. 01/10/14   Burgess Amor, PA-C  furosemide (LASIX) 20 MG tablet Take 1 tablet (20 mg total) by mouth daily. 02/06/14   Laurey Morale, MD  ibuprofen (ADVIL,MOTRIN) 200 MG tablet Take 1,200-1,600 mg by mouth every 8 (eight) hours as needed for moderate pain.    Historical Provider, MD  lisinopril (ZESTRIL) 5 MG tablet Take 1 tablet (5 mg total) by mouth daily. 01/08/14   Dolores Patty, MD  spironolactone (ALDACTONE) 25 MG tablet Take 0.5 tablets (12.5 mg total) by mouth daily. 02/06/14   Laurey Morale, MD  warfarin (COUMADIN) 5 MG tablet Take 1 tablet (5 mg total) by mouth as directed. 02/06/14   Laurey Morale, MD      EPWORTH SLEEPINESS SCALE: 7.   BMI: 24.   ARCHITECTURAL SUMMARY: Total recording time was 448 minutes. Sleep efficiency 84 %. Sleep latency 47 minutes. REM latency 49 minutes. Stage NI 4 %, N2 52 % and N3 10 % and REM sleep 33 %.    RESPIRATORY DATA:  Baseline oxygen saturation is 95 %. The lowest saturation is 91 %. The diagnostic AHI is 1. The RDI is 1. The REM AHI  is 2.  LIMB MOVEMENT SUMMARY: PLM index 1. The patient is noted to have frequent phasic EMG activity during both REM and non-REM sleep.   ELECTROCARDIOGRAM SUMMARY: Average heart rate is 59 with no significant dysrhythmias observed.   IMPRESSION:  1. Phasic EMG activity which can be associated with REM sleep behavior disorder.  2. Early REM latency. This can be seen with REM rebound phenomena due to withdrawal from stimulants and depressants. It also can be seen with narcolepsy although this is less likely.  Thanks for this referral.  Vonzell Lindblad A. Gerilyn Pilgrim, M.D. Diplomat, Biomedical engineer of Sleep Medicine.

## 2014-02-26 MED ORDER — SIMVASTATIN 40 MG PO TABS: 40.0000 mg | ORAL_TABLET | Freq: Every day | ORAL | Status: DC

## 2014-02-26 NOTE — Telephone Encounter (Signed)
Pt was on simvastatin however after lab results on 8/6 elevated LDL and switched to atorvastatin 40  Pt no longer has medicaid and has had to re apply. Pt has continued simvastatin as this is $4 at [facility] Pt is not able to get Crestor with $4 list/ Medicaid formulary  Will continue simvastatin 40

## 2014-03-03 ENCOUNTER — Ambulatory Visit (INDEPENDENT_AMBULATORY_CARE_PROVIDER_SITE_OTHER): Payer: Medicaid Other | Admitting: *Deleted

## 2014-03-03 DIAGNOSIS — I219 Acute myocardial infarction, unspecified: Secondary | ICD-10-CM

## 2014-03-03 DIAGNOSIS — I513 Intracardiac thrombosis, not elsewhere classified: Secondary | ICD-10-CM

## 2014-03-03 DIAGNOSIS — Z5181 Encounter for therapeutic drug level monitoring: Secondary | ICD-10-CM

## 2014-03-03 LAB — POCT INR: INR: 1.2

## 2014-03-11 ENCOUNTER — Encounter (HOSPITAL_COMMUNITY): Payer: Self-pay

## 2014-03-11 ENCOUNTER — Ambulatory Visit (HOSPITAL_COMMUNITY)
Admission: RE | Admit: 2014-03-11 | Discharge: 2014-03-11 | Disposition: A | Discharge status: Home / Self Care | Payer: Medicaid Other | Source: Ambulatory Visit | Attending: Internal Medicine | Admitting: Internal Medicine

## 2014-03-11 ENCOUNTER — Ambulatory Visit (INDEPENDENT_AMBULATORY_CARE_PROVIDER_SITE_OTHER): Payer: Medicaid Other | Admitting: *Deleted

## 2014-03-11 VITALS — BP 92/56 | HR 72 | Wt 161.4 lb

## 2014-03-11 DIAGNOSIS — Z7982 Long term (current) use of aspirin: Secondary | ICD-10-CM | POA: Diagnosis not present

## 2014-03-11 DIAGNOSIS — I2510 Atherosclerotic heart disease of native coronary artery without angina pectoris: Secondary | ICD-10-CM | POA: Diagnosis not present

## 2014-03-11 DIAGNOSIS — I2584 Coronary atherosclerosis due to calcified coronary lesion: Secondary | ICD-10-CM | POA: Insufficient documentation

## 2014-03-11 DIAGNOSIS — F1010 Alcohol abuse, uncomplicated: Secondary | ICD-10-CM | POA: Insufficient documentation

## 2014-03-11 DIAGNOSIS — F17200 Nicotine dependence, unspecified, uncomplicated: Secondary | ICD-10-CM | POA: Diagnosis not present

## 2014-03-11 DIAGNOSIS — I513 Intracardiac thrombosis, not elsewhere classified: Secondary | ICD-10-CM

## 2014-03-11 DIAGNOSIS — Z951 Presence of aortocoronary bypass graft: Secondary | ICD-10-CM | POA: Insufficient documentation

## 2014-03-11 DIAGNOSIS — I5022 Chronic systolic (congestive) heart failure: Secondary | ICD-10-CM

## 2014-03-11 DIAGNOSIS — Z720 Tobacco use: Secondary | ICD-10-CM

## 2014-03-11 DIAGNOSIS — E785 Hyperlipidemia, unspecified: Secondary | ICD-10-CM | POA: Diagnosis not present

## 2014-03-11 DIAGNOSIS — I219 Acute myocardial infarction, unspecified: Secondary | ICD-10-CM | POA: Insufficient documentation

## 2014-03-11 DIAGNOSIS — Z7901 Long term (current) use of anticoagulants: Secondary | ICD-10-CM | POA: Insufficient documentation

## 2014-03-11 DIAGNOSIS — G4733 Obstructive sleep apnea (adult) (pediatric): Secondary | ICD-10-CM | POA: Diagnosis not present

## 2014-03-11 DIAGNOSIS — I509 Heart failure, unspecified: Secondary | ICD-10-CM | POA: Insufficient documentation

## 2014-03-11 DIAGNOSIS — Z5181 Encounter for therapeutic drug level monitoring: Secondary | ICD-10-CM

## 2014-03-11 LAB — BASIC METABOLIC PANEL
ANION GAP: 14 (ref 5–15)
BUN: 12 mg/dL (ref 6–23)
CALCIUM: 9.2 mg/dL (ref 8.4–10.5)
CHLORIDE: 103 meq/L (ref 96–112)
CO2: 24 meq/L (ref 19–32)
CREATININE: 0.9 mg/dL (ref 0.50–1.35)
GFR calc Af Amer: >90 mL/min (ref >90)
GFR calc non Af Amer: >90 mL/min (ref >90)
Glucose, Bld: 81 mg/dL (ref 70–99)
Potassium: 4.5 mEq/L (ref 3.7–5.3)
Sodium: 141 mEq/L (ref 137–147)

## 2014-03-11 LAB — POCT INR: INR: 2.2

## 2014-03-11 MED ORDER — CARVEDILOL 6.25 MG PO TABS: 6.2500 mg | ORAL_TABLET | Freq: Two times a day (BID) | ORAL | Status: DC

## 2014-03-11 MED ORDER — SPIRONOLACTONE 25 MG PO TABS: 25.0000 mg | ORAL_TABLET | Freq: Every day | ORAL | Status: DC

## 2014-03-11 MED ORDER — FUROSEMIDE 20 MG PO TABS: 20.0000 mg | ORAL_TABLET | Freq: Every day | ORAL | Status: DC

## 2014-03-11 NOTE — Patient Instructions (Signed)
INCREASE Spironolactone to 25 mg daily  Labs today and again in two weeks (BMET,BNP)  Your physician recommends that you schedule a follow-up appointment in: 2 months  Do the following things EVERYDAY: 1) Weigh yourself in the morning before breakfast. Write it down and keep it in a log. 2) Take your medicines as prescribed 3) Eat low salt foods-Limit salt (sodium) to 2000 mg per day.  4) Stay as active as you can everyday 5) Limit all fluids for the day to less than 2 liters 6)

## 2014-03-11 NOTE — Progress Notes (Signed)
Patient ID: Jameir Ake, male   DOB: Jun 01, 1976, 38 y.o.   MRN: [id number] PCP: None   HPI:  Mr. Yaffe is a 38 year old male with CAD s/p CABG x 3; LIMA-LAD, SVG -obtuse marginal, SVG-RCA (07/2012), ischemic cardiomyopathy- Hx of anterolateral MI with DES to LAD; reinfarction 02/05/12 while in [place], [place] with thrombosis of the stent and repeat stenting. He also has a history of nicotine dependence, previous ETOH abuse, and non-compliance.  CPX Results 06/2012 Peak VO2: 21.9 % predicted peak VO2: 52.9% VE/VCO2 slope: 33.7 OUES: 1.53 Peak RER: 1.31 Ventilatory Threshold: 17.4 % predicted peak VO2: 42%   L/RHC 07/2012 (Followed by CABG) RA = 4  RV = 35/2/6  PA = 30/14 (20)  PCW = 15 v = 25  Fick cardiac output/index = 3.7/2.1  PVR = 1.4 Woods  FA sat = 97%  PA sat = 62%, 65%  Assessment:  1. 3 vessel disease with totally occluded RCA, severe disease in LCx, and 80% in-stent restenosis in LAD.  2. Low cardiac output, elevated filling pressures.   ECHO 04/2013: EF 20% with mural thrombus, grade I diastolic dysfunction, moderate RV dilation, moderate MR.   Limited ECHO (7/15): moderate LV dilation, EF 10-15%, restrictive diastolic function, there was an apical thrombus noted, moderate functional MR, moderately decreased RV systolic function.   Follow up: Patient is taking his medications and has been compliant with coumadin followup at [facility] office.  No chest pain. He is short of breath walking up a hill but is ok walking on flat ground.  No orthopnea or PND. Dyspnea with moderately exertional activity (yardwork, etc).  No stroke-like symptoms.  Sleep study in 8/15 did not show significant OSA.  He has cut back on drinking. He is still smoking.  Weight is stable.  At last appointment, he had volume overload on exam (mild).  He is now taking Lasix 20 mg daily.  He has not noted much difference in his exercise tolerance.   ECG: NSR, old inferior MI, old anterior MI, inferior and  anterolateral TWIs, QRS 124 msec  Labs (7/15): K 4.4, creatinine 0.87, HCT 42.6 Labs (8/15): LDL 158, HDL 56  SH: Married, lives in [place], 2 kids, on disability, smokes 1 ppd, +ETOH  FH: Brother with MI at 43, grandmother MI at 14, mother SCD  ROS: All systems negative except as listed in HPI, PMH and Problem List.  Past Medical History  Diagnosis Date  . GERD (gastroesophageal reflux disease)   . CAD (coronary artery disease) 01/22/12    a.anterolateral MI with DES to LAD EF 25% b. reinfarction 8/13 with thrombosis of stent c.LIMA-LAD, SVG-OM, SVG-RCA (07/2012)   . Hyperlipidemia   . Tobacco abuse   . Chronic systolic CHF (congestive heart failure)     a. (07/2012) EF 20-25% diff HK with reg wall abnl, HK anterolat wall, akinesis inferosep, mod MR,   . Noncompliance   . Syncope   . LV (left ventricular) mural thrombus     "just found it in Oct" (07/09/2012)  . Ischemic cardiomyopathy   . Anginal pain   . Myocardial infarction 01/2012; 02/2012; 04/2012    Current Outpatient Prescriptions  Medication Sig Dispense Refill  . aspirin EC 81 MG tablet Take 1 tablet (81 mg total) by mouth daily.  30 tablet  6  . atorvastatin (LIPITOR) 80 MG tablet Take 80 mg by mouth daily.      . carvedilol (COREG) 6.25 MG tablet Take 1 tablet (6.25 mg total) by mouth  2 (two) times daily with a meal.  60 tablet  6  . furosemide (LASIX) 20 MG tablet Take 1 tablet (20 mg total) by mouth daily.  30 tablet  3  . ibuprofen (ADVIL,MOTRIN) 200 MG tablet Take 1,200-1,600 mg by mouth every 8 (eight) hours as needed for moderate pain.      Marland Kitchen lisinopril (ZESTRIL) 5 MG tablet Take 1 tablet (5 mg total) by mouth daily.  30 tablet  6  . spironolactone (ALDACTONE) 25 MG tablet Take 1 tablet (25 mg total) by mouth daily.  30 tablet  3  . warfarin (COUMADIN) 5 MG tablet Take 1 tablet (5 mg total) by mouth as directed.  30 tablet  0   No current facility-administered medications for this encounter.    Filed Vitals:    03/11/14 1116  BP: 92/56  Pulse: 72  Weight: 161 lb 6.4 oz (73.211 kg)  SpO2: 99%    PHYSICAL EXAM: General:   Fatigued appearing. No respiratory difficulty; GF present HEENT: normal Neck: supple. JVP 7 cm. Carotids 2+ bilat; no bruits. No lymphadenopathy or thryomegaly appreciated. Cor: PMI nondisplaced. Regular rate & rhythm. No rubs, gallops or murmurs. Midline incision approximated and healed Lungs: prolonged expiratory phase.  Abdomen: soft, nontender, nondistended. No hepatosplenomegaly. No bruits or masses. Good bowel sounds. Extremities: no cyanosis, clubbing, rash, no edema Neuro: alert & oriented x 3, cranial nerves grossly intact. moves all 4 extremities w/o difficulty. Affect pleasant.  ASSESSMENT & PLAN:  1) Chronic systolic HF: Echo (7/15) with EF 10-15%. Ischemic cardiomyopathy, not much improvement in LV function post-CABG. NYHA class II symptoms. He looks euvolemic.  Compliance seems to have improved.  - Continue Lasix 20 mg daily.  - Continue current Coreg and lisinopril.  - Increase spironolactone to 25 mg daily with BMET today and again in 2 wks.  - To be seen tomorrow by EP for ICD (narrow QRS, not CRT candidate).  - Long-term, needs to stop smoking and should cut back on ETOH to be candidate for advanced therapies.  We discussed this again today.  He seems to have done well with ETOH limitation but is still smoking.  2) CAD: s/p CABG x 3 in 1/14. No chest pain.  He is now on atorvastatin and ASA 81.  With LV thrombus on last echo, I restarted coumadin.  Marland Kitchen  3) Mural Thrombus: Noted again on most recent echo.  He is back on coumadin and has been compliant with INR checks so far.  4) OSA:  Sleep study not suggestive of OSA.  5) Hyperlipidemia: LDL 158 on simvastatin, he is now on atorvastatin.  He has had some insurance changes and is not sure that he is going to be able to afford it.  I will have him talk to the CHF social worker.   Followup in 2 months.     Marca Ancona 03/11/2014

## 2014-03-12 ENCOUNTER — Encounter: Payer: Self-pay | Admitting: Internal Medicine

## 2014-03-12 ENCOUNTER — Ambulatory Visit (INDEPENDENT_AMBULATORY_CARE_PROVIDER_SITE_OTHER): Payer: Medicaid Other | Admitting: Internal Medicine

## 2014-03-12 VITALS — BP 102/68 | HR 61 | Ht 68.0 in | Wt 162.2 lb

## 2014-03-12 DIAGNOSIS — I2589 Other forms of chronic ischemic heart disease: Secondary | ICD-10-CM

## 2014-03-12 DIAGNOSIS — I5022 Chronic systolic (congestive) heart failure: Secondary | ICD-10-CM

## 2014-03-12 DIAGNOSIS — I255 Ischemic cardiomyopathy: Secondary | ICD-10-CM

## 2014-03-12 DIAGNOSIS — I509 Heart failure, unspecified: Secondary | ICD-10-CM
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
## 2014-03-12 NOTE — Patient Instructions (Signed)
Your physician recommends that you continue on your current medications as directed. Please refer to the Current Medication list given to you today.  Dr. Graciela Husbands is going to speak with heart failure clinic and will call you with decision.

## 2014-03-18 ENCOUNTER — Telehealth: Payer: Self-pay | Admitting: *Deleted

## 2014-03-18 ENCOUNTER — Encounter: Payer: Self-pay | Admitting: *Deleted

## 2014-03-18 DIAGNOSIS — Z01812 Encounter for preprocedural laboratory examination: Secondary | ICD-10-CM

## 2014-03-18 DIAGNOSIS — I5022 Chronic systolic (congestive) heart failure: Secondary | ICD-10-CM

## 2014-03-18 NOTE — Telephone Encounter (Signed)
Scheduled S-ICD for 03/28/14 w/ anesthesia. Pre procedure labs scheduled for 9/18. Wound check follow up on 10/5 at 10:00 am.  Reviewed letter of instructions with patient and left at front desk for patient pick up/mailed to home address per request. Patient verbalized understanding and agreeable to plan.

## 2014-03-21 ENCOUNTER — Other Ambulatory Visit (INDEPENDENT_AMBULATORY_CARE_PROVIDER_SITE_OTHER): Payer: Medicaid Other

## 2014-03-21 DIAGNOSIS — I5022 Chronic systolic (congestive) heart failure: Secondary | ICD-10-CM

## 2014-03-21 DIAGNOSIS — Z01812 Encounter for preprocedural laboratory examination: Secondary | ICD-10-CM

## 2014-03-21 DIAGNOSIS — I509 Heart failure, unspecified: Secondary | ICD-10-CM

## 2014-03-21 LAB — CBC WITH DIFFERENTIAL/PLATELET
BASOS ABS: 0 10*3/uL (ref 0.0–0.1)
BASOS PCT: 0.3 % (ref 0.0–3.0)
EOS ABS: 0.1 10*3/uL (ref 0.0–0.7)
Eosinophils Relative: 1.7 % (ref 0.0–5.0)
HCT: 38.7 % — ABNORMAL LOW (ref 39.0–52.0)
HEMOGLOBIN: 13.3 g/dL (ref 13.0–17.0)
Lymphocytes Relative: 39.9 % (ref 12.0–46.0)
Lymphs Abs: 3.1 10*3/uL (ref 0.7–4.0)
MCHC: 34.3 g/dL (ref 30.0–36.0)
MCV: 100.1 fl — ABNORMAL HIGH (ref 78.0–100.0)
Monocytes Absolute: 0.5 10*3/uL (ref 0.1–1.0)
Monocytes Relative: 6.7 % (ref 3.0–12.0)
NEUTROS ABS: 4.1 10*3/uL (ref 1.4–7.7)
Neutrophils Relative %: 51.4 % (ref 43.0–77.0)
Platelets: 230 10*3/uL (ref 150.0–400.0)
RBC: 3.86 Mil/uL — ABNORMAL LOW (ref 4.22–5.81)
RDW: 12.8 % (ref 11.5–15.5)
WBC: 7.9 10*3/uL (ref 4.0–10.5)

## 2014-03-21 LAB — BASIC METABOLIC PANEL
BUN: 12 mg/dL (ref 6–23)
CHLORIDE: 107 meq/L (ref 96–112)
CO2: 23 meq/L (ref 19–32)
Calcium: 9 mg/dL (ref 8.4–10.5)
Creatinine, Ser: 1.1 mg/dL (ref 0.4–1.5)
GFR: 77.83 mL/min (ref >60.00)
GLUCOSE: 71 mg/dL (ref 70–99)
Potassium: 4 mEq/L (ref 3.5–5.1)
SODIUM: 137 meq/L (ref 135–145)

## 2014-03-21 LAB — PROTIME-INR
INR: 2.5 ratio — ABNORMAL HIGH (ref 0.8–1.0)
Prothrombin Time: 26.6 s — ABNORMAL HIGH (ref 9.6–13.1)

## 2014-03-25 ENCOUNTER — Ambulatory Visit (INDEPENDENT_AMBULATORY_CARE_PROVIDER_SITE_OTHER): Payer: Medicaid Other | Admitting: *Deleted

## 2014-03-25 ENCOUNTER — Telehealth (HOSPITAL_COMMUNITY): Payer: Self-pay | Admitting: Vascular Surgery

## 2014-03-25 DIAGNOSIS — I2584 Coronary atherosclerosis due to calcified coronary lesion: Secondary | ICD-10-CM

## 2014-03-25 DIAGNOSIS — I513 Intracardiac thrombosis, not elsewhere classified: Secondary | ICD-10-CM

## 2014-03-25 DIAGNOSIS — Z5181 Encounter for therapeutic drug level monitoring: Secondary | ICD-10-CM

## 2014-03-25 DIAGNOSIS — I2510 Atherosclerotic heart disease of native coronary artery without angina pectoris: Secondary | ICD-10-CM

## 2014-03-25 DIAGNOSIS — I219 Acute myocardial infarction, unspecified: Secondary | ICD-10-CM

## 2014-03-25 LAB — POCT INR: INR: 1.4

## 2014-03-25 MED ORDER — WARFARIN SODIUM 5 MG PO TABS: 5.0000 mg | ORAL_TABLET | ORAL | Status: DC

## 2014-03-25 NOTE — Telephone Encounter (Signed)
Will speak with social worker regarding medication assistance We have switched this med multiple times and I am really unsure how to further assist at this point PER DR. [facility] Pt should really be on atorvastatin and not switch back to simvastatin Unsure if pt is willing to drive to [facility] monthly

## 2014-03-25 NOTE — Telephone Encounter (Signed)
Pt called he can not afford his Atorvastatin he wants to know if he could get something else on th e4 dollar plan @ [facility] please advise

## 2014-03-26 ENCOUNTER — Encounter (HOSPITAL_COMMUNITY): Payer: Self-pay | Admitting: Pharmacy Technician

## 2014-03-26 ENCOUNTER — Telehealth: Payer: Self-pay | Admitting: Licensed Clinical Social Worker

## 2014-03-26 NOTE — Telephone Encounter (Signed)
CSW received referral to assist patient with medications and medicaid follow up. CSW contacted patient via phone to discuss options regarding medications. Patient reports he has a pending medicaid renewal application and is working with Ms Seward Meth at [phone]. CSW left message for medicaid worker regarding status of pending application. Patient assisted with medication assistance program to obtain needed medication. Lasandra Beech, LCSW [phone]

## 2014-03-28 ENCOUNTER — Ambulatory Visit (HOSPITAL_COMMUNITY): Payer: Medicaid Other | Admitting: Anesthesiology

## 2014-03-28 ENCOUNTER — Ambulatory Visit (HOSPITAL_COMMUNITY): Payer: Medicaid Other

## 2014-03-28 ENCOUNTER — Ambulatory Visit (HOSPITAL_COMMUNITY)
Admission: RE | Admit: 2014-03-28 | Discharge: 2014-03-28 | Disposition: A | Discharge status: Home / Self Care | Payer: Medicaid Other | Source: Ambulatory Visit | Attending: Internal Medicine | Admitting: Internal Medicine

## 2014-03-28 ENCOUNTER — Encounter (HOSPITAL_COMMUNITY)
Admission: RE | Disposition: A | Discharge status: Home / Self Care | Payer: Self-pay | Source: Ambulatory Visit | Attending: Internal Medicine

## 2014-03-28 ENCOUNTER — Encounter (HOSPITAL_COMMUNITY): Payer: Self-pay | Admitting: Anesthesiology

## 2014-03-28 ENCOUNTER — Encounter (HOSPITAL_COMMUNITY): Payer: Medicaid Other | Admitting: Anesthesiology

## 2014-03-28 DIAGNOSIS — Z9119 Patient's noncompliance with other medical treatment and regimen: Secondary | ICD-10-CM | POA: Diagnosis not present

## 2014-03-28 DIAGNOSIS — I2589 Other forms of chronic ischemic heart disease: Secondary | ICD-10-CM | POA: Diagnosis present

## 2014-03-28 DIAGNOSIS — K219 Gastro-esophageal reflux disease without esophagitis: Secondary | ICD-10-CM | POA: Diagnosis not present

## 2014-03-28 DIAGNOSIS — Z720 Tobacco use: Secondary | ICD-10-CM | POA: Diagnosis present

## 2014-03-28 DIAGNOSIS — Z7901 Long term (current) use of anticoagulants: Secondary | ICD-10-CM | POA: Diagnosis not present

## 2014-03-28 DIAGNOSIS — E785 Hyperlipidemia, unspecified: Secondary | ICD-10-CM | POA: Insufficient documentation

## 2014-03-28 DIAGNOSIS — I252 Old myocardial infarction: Secondary | ICD-10-CM | POA: Diagnosis not present

## 2014-03-28 DIAGNOSIS — I5022 Chronic systolic (congestive) heart failure: Secondary | ICD-10-CM | POA: Diagnosis not present

## 2014-03-28 DIAGNOSIS — F1010 Alcohol abuse, uncomplicated: Secondary | ICD-10-CM | POA: Diagnosis not present

## 2014-03-28 DIAGNOSIS — Z91199 Patient's noncompliance with other medical treatment and regimen due to unspecified reason: Secondary | ICD-10-CM | POA: Diagnosis not present

## 2014-03-28 DIAGNOSIS — I255 Ischemic cardiomyopathy: Secondary | ICD-10-CM | POA: Diagnosis present

## 2014-03-28 DIAGNOSIS — Z951 Presence of aortocoronary bypass graft: Secondary | ICD-10-CM | POA: Insufficient documentation

## 2014-03-28 DIAGNOSIS — Z7982 Long term (current) use of aspirin: Secondary | ICD-10-CM | POA: Insufficient documentation

## 2014-03-28 DIAGNOSIS — Z79899 Other long term (current) drug therapy: Secondary | ICD-10-CM | POA: Diagnosis not present

## 2014-03-28 DIAGNOSIS — I509 Heart failure, unspecified: Secondary | ICD-10-CM | POA: Insufficient documentation

## 2014-03-28 DIAGNOSIS — F17200 Nicotine dependence, unspecified, uncomplicated: Secondary | ICD-10-CM | POA: Insufficient documentation

## 2014-03-28 DIAGNOSIS — I059 Rheumatic mitral valve disease, unspecified: Secondary | ICD-10-CM | POA: Diagnosis not present

## 2014-03-28 DIAGNOSIS — I513 Intracardiac thrombosis, not elsewhere classified: Secondary | ICD-10-CM | POA: Diagnosis present

## 2014-03-28 DIAGNOSIS — I2510 Atherosclerotic heart disease of native coronary artery without angina pectoris: Secondary | ICD-10-CM | POA: Insufficient documentation

## 2014-03-28 DIAGNOSIS — I340 Nonrheumatic mitral (valve) insufficiency: Secondary | ICD-10-CM | POA: Diagnosis present

## 2014-03-28 HISTORY — DX: Nonrheumatic mitral (valve) insufficiency: I34.0

## 2014-03-28 HISTORY — PX: IMPLANTABLE CARDIOVERTER DEFIBRILLATOR IMPLANT: SHX5473

## 2014-03-28 HISTORY — PX: CARDIAC DEFIBRILLATOR PLACEMENT: SHX171

## 2014-03-28 LAB — PROTIME-INR
INR: 1.34 (ref 0.00–1.49)
Prothrombin Time: 16.6 seconds — ABNORMAL HIGH (ref 11.6–15.2)

## 2014-03-28 LAB — SURGICAL PCR SCREEN
MRSA, PCR: NEGATIVE
Staphylococcus aureus: NEGATIVE

## 2014-03-28 SURGERY — IMPLANTABLE CARDIOVERTER DEFIBRILLATOR IMPLANT
Anesthesia: Monitor Anesthesia Care

## 2014-03-28 MED ORDER — MUPIROCIN 2 % EX OINT
1.0000 "application " | TOPICAL_OINTMENT | Freq: Once | CUTANEOUS | Status: DC
  Filled 2014-03-28: qty 22

## 2014-03-28 MED ORDER — SODIUM CHLORIDE 0.9 % IR SOLN
80.0000 mg | Status: DC
  Filled 2014-03-28 (×2): qty 2

## 2014-03-28 MED ORDER — HEPARIN (PORCINE) IN NACL 2-0.9 UNIT/ML-% IJ SOLN
INTRAMUSCULAR | Status: AC
  Filled 2014-03-28: qty 500

## 2014-03-28 MED ORDER — SODIUM CHLORIDE 0.9 % IV SOLN
INTRAVENOUS | Status: DC
  Administered 2014-03-28: 07:00:00 via INTRAVENOUS

## 2014-03-28 MED ORDER — SODIUM CHLORIDE 0.9 % IV SOLN
10.0000 mg | INTRAVENOUS | Status: DC | PRN
  Administered 2014-03-28: 25 ug/min via INTRAVENOUS

## 2014-03-28 MED ORDER — SODIUM CHLORIDE 0.9 % IV SOLN: INTRAVENOUS | Status: AC

## 2014-03-28 MED ORDER — ASPIRIN EC 81 MG PO TBEC
81.0000 mg | DELAYED_RELEASE_TABLET | Freq: Every day | ORAL | Status: DC
  Administered 2014-03-28: 81 mg via ORAL
  Filled 2014-03-28: qty 1

## 2014-03-28 MED ORDER — VANCOMYCIN HCL IN DEXTROSE 1-5 GM/200ML-% IV SOLN
1000.0000 mg | Freq: Two times a day (BID) | INTRAVENOUS | Status: AC
  Administered 2014-03-28: 1000 mg via INTRAVENOUS
  Filled 2014-03-28: qty 200

## 2014-03-28 MED ORDER — PROPOFOL 10 MG/ML IV BOLUS
INTRAVENOUS | Status: DC | PRN
  Administered 2014-03-28: 200 mg via INTRAVENOUS

## 2014-03-28 MED ORDER — LISINOPRIL 5 MG PO TABS
5.0000 mg | ORAL_TABLET | Freq: Every day | ORAL | Status: DC
  Administered 2014-03-28: 5 mg via ORAL
  Filled 2014-03-28: qty 1

## 2014-03-28 MED ORDER — ONDANSETRON HCL 4 MG/2ML IJ SOLN
INTRAMUSCULAR | Status: DC | PRN
  Administered 2014-03-28: 4 mg via INTRAVENOUS

## 2014-03-28 MED ORDER — ATORVASTATIN CALCIUM 80 MG PO TABS
80.0000 mg | ORAL_TABLET | Freq: Every day | ORAL | Status: DC
  Administered 2014-03-28: 80 mg via ORAL
  Filled 2014-03-28: qty 1

## 2014-03-28 MED ORDER — CARVEDILOL 6.25 MG PO TABS
6.2500 mg | ORAL_TABLET | Freq: Two times a day (BID) | ORAL | Status: DC
  Administered 2014-03-28: 6.25 mg via ORAL
  Filled 2014-03-28 (×2): qty 1

## 2014-03-28 MED ORDER — ONDANSETRON HCL 4 MG/2ML IJ SOLN: 4.0000 mg | Freq: Four times a day (QID) | INTRAMUSCULAR | Status: DC | PRN

## 2014-03-28 MED ORDER — MUPIROCIN 2 % EX OINT
TOPICAL_OINTMENT | CUTANEOUS | Status: AC
  Administered 2014-03-28: 1 via NASAL
  Filled 2014-03-28: qty 22

## 2014-03-28 MED ORDER — FENTANYL CITRATE 0.05 MG/ML IJ SOLN
INTRAMUSCULAR | Status: AC
  Filled 2014-03-28: qty 2

## 2014-03-28 MED ORDER — BUPIVACAINE HCL (PF) 0.25 % IJ SOLN
INTRAMUSCULAR | Status: AC
  Filled 2014-03-28: qty 60

## 2014-03-28 MED ORDER — MIDAZOLAM HCL 5 MG/5ML IJ SOLN
INTRAMUSCULAR | Status: DC | PRN
  Administered 2014-03-28 (×2): 1 mg via INTRAVENOUS

## 2014-03-28 MED ORDER — MORPHINE SULFATE 4 MG/ML IJ SOLN
INTRAMUSCULAR | Status: AC
  Filled 2014-03-28: qty 1

## 2014-03-28 MED ORDER — SPIRONOLACTONE 25 MG PO TABS
25.0000 mg | ORAL_TABLET | Freq: Every day | ORAL | Status: DC
  Administered 2014-03-28: 25 mg via ORAL
  Filled 2014-03-28: qty 1

## 2014-03-28 MED ORDER — SODIUM CHLORIDE 0.9 % IV SOLN
INTRAVENOUS | Status: DC | PRN
  Administered 2014-03-28: 07:00:00 via INTRAVENOUS

## 2014-03-28 MED ORDER — WARFARIN - PHYSICIAN DOSING INPATIENT
Freq: Every day | Status: DC
  Administered 2014-03-28: 18:00:00

## 2014-03-28 MED ORDER — LIDOCAINE HCL (CARDIAC) 20 MG/ML IV SOLN
INTRAVENOUS | Status: DC | PRN
  Administered 2014-03-28: 100 mg via INTRAVENOUS

## 2014-03-28 MED ORDER — WARFARIN SODIUM 5 MG PO TABS
5.0000 mg | ORAL_TABLET | ORAL | Status: DC
  Administered 2014-03-28: 5 mg via ORAL
  Filled 2014-03-28: qty 1

## 2014-03-28 MED ORDER — HYDROCODONE-ACETAMINOPHEN 5-325 MG PO TABS: 1.0000 | ORAL_TABLET | Freq: Four times a day (QID) | ORAL | Status: DC | PRN

## 2014-03-28 MED ORDER — MORPHINE SULFATE 4 MG/ML IJ SOLN
4.0000 mg | INTRAMUSCULAR | Status: DC | PRN
  Administered 2014-03-28 (×2): 4 mg via INTRAVENOUS
  Filled 2014-03-28: qty 1

## 2014-03-28 MED ORDER — CHLORHEXIDINE GLUCONATE 4 % EX LIQD
60.0000 mL | Freq: Once | CUTANEOUS | Status: DC
  Filled 2014-03-28: qty 60

## 2014-03-28 MED ORDER — WARFARIN SODIUM 10 MG PO TABS: 10.0000 mg | ORAL_TABLET | ORAL | Status: DC

## 2014-03-28 MED ORDER — FENTANYL CITRATE 0.05 MG/ML IJ SOLN
INTRAMUSCULAR | Status: DC | PRN
  Administered 2014-03-28 (×2): 50 ug via INTRAVENOUS
  Administered 2014-03-28: 25 ug via INTRAVENOUS
  Administered 2014-03-28: 50 ug via INTRAVENOUS
  Administered 2014-03-28: 25 ug via INTRAVENOUS
  Administered 2014-03-28 (×3): 50 ug via INTRAVENOUS

## 2014-03-28 MED ORDER — FUROSEMIDE 20 MG PO TABS
20.0000 mg | ORAL_TABLET | Freq: Every day | ORAL | Status: DC
  Administered 2014-03-28: 20 mg via ORAL
  Filled 2014-03-28: qty 1

## 2014-03-28 MED ORDER — WARFARIN SODIUM 5 MG PO TABS: 5.0000 mg | ORAL_TABLET | Freq: Every day | ORAL | Status: DC

## 2014-03-28 MED ORDER — MUPIROCIN 2 % EX OINT
1.0000 "application " | TOPICAL_OINTMENT | Freq: Once | CUTANEOUS | Status: AC
  Administered 2014-03-28: 1 via NASAL
  Filled 2014-03-28: qty 22

## 2014-03-28 MED ORDER — VANCOMYCIN HCL IN DEXTROSE 1-5 GM/200ML-% IV SOLN
1000.0000 mg | INTRAVENOUS | Status: AC
  Administered 2014-03-28: 1000 mg via INTRAVENOUS
  Filled 2014-03-28 (×2): qty 200

## 2014-03-28 MED ORDER — ACETAMINOPHEN 325 MG PO TABS: 325.0000 mg | ORAL_TABLET | ORAL | Status: DC | PRN

## 2014-03-28 NOTE — Interval H&P Note (Signed)
ICD Criteria   Current LVEF:15% ;Obtained > or = 1 month ago and < or = 3 months ago.  NYHA Functional Classification: Class II  Heart Failure History:  Yes   Non-Ischemic Dilated Cardiomyopathy History:  No.  Atrial Fibrillation/Atrial Flutter:  No.  Ventricular Tachycardia History:  No.  Cardiac Arrest History:  No  History of Syndromes with Risk of Sudden Death:  No.  Previous ICD:  No.  Electrophysiology Study: No.  Prior MI: Yes, Most recent MI timeframe is > 40 days.  PPM: No.  OSA:  No  Patient Life Expectancy of >=1 year: Yes.  Anticoagulation Therapy:  Patient is NOT on anticoagulation therapy.   Beta Blocker Therapy:  Yes.   Ace Inhibitor/ARB Therapy:  Yes.History and Physical Interval Note:  03/28/2014 7:26 AM  Logan [name]  has presented today for surgery, with the diagnosis of chronic systolic chf  The various methods of treatment have been discussed with the patient and family. After consideration of risks, benefits and other options for treatment, the patient has consented to  Procedure(s): SUB Q- IMPLANTABLE CARDIOVERTER DEFIBRILLATOR IMPLANT (N/A) as a surgical intervention .  The patient's history has been reviewed, patient examined, no change in status, stable for surgery.  I have reviewed the patient's chart and labs.  Questions were answered to the patient's satisfaction.     Sherryl Manges

## 2014-03-28 NOTE — Discharge Instructions (Signed)
**  PLEASE REMEMBER TO BRING ALL OF YOUR MEDICATIONS TO EACH OF YOUR FOLLOW-UP OFFICE VISITS.     Supplemental Discharge Instructions for  Pacemaker/Defibrillator Patients  Activity No heavy lifting or vigorous activity with your left/right arm for 6 to 8 weeks.    NO DRIVING for  1 wk   ; you may begin driving on     .  WOUND CARE   Keep the wound area clean and dry.  Do not get this area wet for one day. No showers for one day; you may shower on  9/26 .   The tape/steri-strips on your wound will fall off; do not pull them off.  No bandage is needed on the site.  DO  NOT apply any creams, oils, or ointments to the wound area.   If you notice any drainage or discharge from the wound, any swelling or bruising at the site, or you develop a fever > 101? F after you are discharged home, call the office at once.  Special Instructions   You are still able to use cellular telephones; use the ear opposite the side where you have your pacemaker/defibrillator.  Avoid carrying your cellular phone near your device.   When traveling through airports, show security personnel your identification card to avoid being screened in the metal detectors.  Ask the security personnel to use the hand wand.   Avoid arc welding equipment, MRI testing (magnetic resonance imaging), TENS units (transcutaneous nerve stimulators).  Call the office for questions about other devices.   Avoid electrical appliances that are in poor condition or are not properly grounded.   Microwave ovens are safe to be near or to operate.  Additional information for defibrillator patients should your device go off:   If your device goes off ONCE and you feel fine afterward, notify the device clinic nurses.   If your device goes off ONCE and you do not feel well afterward, call 911.   If your device goes off TWICE, call 911.   If your device goes off THREE times in one day, call 911.  DO NOT DRIVE YOURSELF OR A FAMILY MEMBER WITH A  DEFIBRILLATOR TO THE HOSPITAL--CALL 911.

## 2014-03-28 NOTE — Progress Notes (Signed)
Report to relief:Sandi Eddins

## 2014-03-28 NOTE — Progress Notes (Signed)
Unable to empty bladder after several attempts. Dr. Graciela Husbands aware. Orders received.
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
## 2014-03-28 NOTE — Transfer of Care (Signed)
Immediate Anesthesia Transfer of Care Note  Patient: Logan [name]  Procedure(s) Performed: Procedure(s): SUB Q- IMPLANTABLE CARDIOVERTER DEFIBRILLATOR IMPLANT (N/A)  Patient Location: PACU  Anesthesia Type:General  Level of Consciousness: awake  Airway & Oxygen Therapy: Patient Spontanous Breathing and Patient connected to nasal cannula oxygen  Post-op Assessment: Report given to PACU RN and Post -op Vital signs reviewed and stable  Post vital signs: Reviewed and stable  Complications: No apparent anesthesia complications

## 2014-03-28 NOTE — Progress Notes (Signed)
Patient transported to 3 E 05

## 2014-03-28 NOTE — Anesthesia Procedure Notes (Signed)
Procedure Name: LMA Insertion Date/Time: 03/28/2014 7:54 AM Performed by: Armandina Gemma Pre-anesthesia Checklist: Patient identified, Timeout performed, Emergency Drugs available, Suction available and Patient being monitored Patient Re-evaluated:Patient Re-evaluated prior to inductionOxygen Delivery Method: Circle system utilized Preoxygenation: Pre-oxygenation with 100% oxygen Intubation Type: IV induction Ventilation: Mask ventilation without difficulty LMA: LMA inserted LMA Size: 4.0 Tube size: 4.0 mm Number of attempts: 1 Placement Confirmation: positive ETCO2 and breath sounds checked- equal and bilateral Tube secured with: Tape Dental Injury: Teeth and Oropharynx as per pre-operative assessment  Comments: IV induction Smith- LMA AM CRNA atraumatic teeth and mouth as preop- missing chipped teeth - very poor dentition

## 2014-03-28 NOTE — Transfer of Care (Signed)
Immediate Anesthesia Transfer of Care Note  Patient: Logan [name]  Procedure(s) Performed: Procedure(s): SUB Q- IMPLANTABLE CARDIOVERTER DEFIBRILLATOR IMPLANT (N/A)  Patient Location: PACU and Cath Lab  Anesthesia Type:General  Level of Consciousness: awake, alert , oriented and patient cooperative  Airway & Oxygen Therapy: Patient Spontanous Breathing  Post-op Assessment: Report given to PACU RN and Post -op Vital signs reviewed and stable  Post vital signs: stable  Complications: No apparent anesthesia complications

## 2014-03-28 NOTE — Anesthesia Preprocedure Evaluation (Addendum)
Anesthesia Evaluation  Patient identified by MRN, date of birth, ID band Patient awake    Reviewed: Allergy & Precautions, H&P , NPO status , Patient's Chart, lab work & pertinent test results  Airway       Dental  (+) Dental Advisory Given, Poor Dentition, Chipped, Missing   Pulmonary Current Smoker,          Cardiovascular + angina + CAD, + Past MI and +CHF     Neuro/Psych    GI/Hepatic GERD-  ,  Endo/Other    Renal/GU      Musculoskeletal   Abdominal   Peds  Hematology   Anesthesia Other Findings   Reproductive/Obstetrics                          Anesthesia Physical Anesthesia Plan  ASA: IV  Anesthesia Plan: MAC and General   Post-op Pain Management:    Induction: Intravenous  Airway Management Planned: LMA and Mask  Additional Equipment:   Intra-op Plan:   Post-operative Plan: Extubation in OR  Informed Consent: I have reviewed the patients History and Physical, chart, labs and discussed the procedure including the risks, benefits and alternatives for the proposed anesthesia with the patient or authorized representative who has indicated his/her understanding and acceptance.     Plan Discussed with: CRNA, Anesthesiologist and Surgeon  Anesthesia Plan Comments:         Anesthesia Quick Evaluation

## 2014-03-28 NOTE — Progress Notes (Signed)
1400 transferred in from cardiac cath dept SP ICD placement  Via stretcheer . Pt with dressindg to sternal region . Peri epigastic and to rL lateral rib areas  dry and intact

## 2014-03-28 NOTE — Progress Notes (Signed)
Discharge information given to patient. Patient verbalized understanding. Pt has prescription given by NP for pain medicine. Pt with no complaints at this time. Dressings from ICD insertion intact and dressing care instructions given to patient. Telemetry monitor removed and monitor tech notified. IV removed. Pt escorted via wheelchair by NT with family. Huel Coventry, RN

## 2014-03-28 NOTE — Anesthesia Postprocedure Evaluation (Signed)
  Anesthesia Post-op Note  Patient: Logan [name]  Procedure(s) Performed: Procedure(s): SUB Q- IMPLANTABLE CARDIOVERTER DEFIBRILLATOR IMPLANT (N/A)  Patient Location: PACU  Anesthesia Type:General  Level of Consciousness: awake, alert , oriented and patient cooperative  Airway and Oxygen Therapy: Patient Spontanous Breathing  Post-op Pain: none  Post-op Assessment: Post-op Vital signs reviewed, Patient's Cardiovascular Status Stable, Respiratory Function Stable, Patent Airway and No signs of Nausea or vomiting  Post-op Vital Signs: stable  Last Vitals:  Filed Vitals:   03/28/14 0543  BP: 114/69  Pulse: 63  Temp: 36.7 C  Resp: 20    Complications: No apparent anesthesia complications

## 2014-03-28 NOTE — Progress Notes (Signed)
Post op eval looks good   Will discharge to home We discussed alcohol addiction and he says he is done,  i have encouraged him to seek supportive efforts, teen challenge, AA etc  Can have vicoidn # 6 at discharge  Limiting vbecasue of fear(mine) of alcohol Instructions given

## 2014-03-28 NOTE — Discharge Summary (Signed)
Discharge Summary   Patient ID: Logan [name],  MRN: [id number], DOB/AGE: [birth year] 38 y.o.  Admit date: 03/28/2014 Discharge date: 03/28/2014  Primary Care Provider: DONDIEGO,RICHARD M Primary Cardiologist: Golden Circle, MD / S. Graciela Husbands, MD   Discharge Diagnoses Principal Problem:   Ischemic cardiomyopathy  **Status-post subcutaneous ICD placement this admission.  Active Problems:   LV (left ventricular) mural thrombus - chronic coumadin   Tobacco abuse   CAD (coronary artery disease) s/p CABG   Noncompliance   ETOH abuse   Hyperlipidemia   Moderate mitral regurgitation  Allergies Allergies  Allergen Reactions  . Adhesive [Tape] Rash    "paper tape is ok" (07/09/2012)  . Bee Venom Anaphylaxis  . Penicillins Rash   Procedures  03/28/2014  Subcutaneous ICD Placement (additional details are not currently available) _____________   History of Present Illness  38 y/o male with a h/o CAD, ischemic cardiomyopathy, LV mural thrombus on chronic coumadin anticoagulation, chronic CHF, and noncompliance with tobacco and alcohol abuse who is followed in CHF clinic.  He was recently referred to electrophysiology for consideration of ICD placement after 2D echo in 01/2014, revealed persistent LV dysfunction with an EF of 10-15%.  It was felt that he would benefit from ICD placement and decision was made to pursue placement of a subcutaneous ICD.  Hospital Course  Patient presented to the EP lab on 03/28/2014 and underwent successful placement of a subcutaneous AICD.  He tolerated procedure well and post-procedure, he has been ambulating without difficulty.  Post-operative CXR shows no acute abnormalities.  He has been counseled on the importance of tobacco and alcohol cessation along with lifestyle and medication compliance.  He will be discharged home today in good condition.  Discharge Vitals Blood pressure 104/61, pulse 62, temperature 97.7 F (36.5 C), temperature source Oral, resp.  rate 18, height 5\' 8"  (1.727 m), weight 162 lb 4.1 oz (73.6 kg), SpO2 100.00%.  Filed Weights   03/28/14 0543 03/28/14 1440  Weight: 162 lb (73.483 kg) 162 lb 4.1 oz (73.6 kg)   Labs  None  Disposition  Pt is being discharged home today in good condition.  Follow-up Plans & Appointments      Follow-up Information   Follow up with [facility] On 04/03/2014. (9:40 AM)       Follow up with [facility] On 04/07/2014. (10 AM)    Contact information:   [address] [place], [place] [address] [phone]      Follow up with [facility] PA/NP On 05/12/2014. (10:45 AM - [facility])      Discharge Medications    Medication List    STOP taking these medications       ibuprofen 800 MG tablet  Commonly known as:  ADVIL,MOTRIN      TAKE these medications       aspirin EC 81 MG tablet  Take 1 tablet (81 mg total) by mouth daily.     atorvastatin 80 MG tablet  Commonly known as:  LIPITOR  Take 80 mg by mouth daily at 6 PM.     carvedilol 6.25 MG tablet  Commonly known as:  COREG  Take 1 tablet (6.25 mg total) by mouth 2 (two) times daily with a meal.     furosemide 20 MG tablet  Commonly known as:  LASIX  Take 1 tablet (20 mg total) by mouth daily.     HYDROcodone-acetaminophen 5-325 MG per tablet  Commonly known as:  NORCO/VICODIN  Take 1 tablet  by mouth every 6 (six) hours as needed for moderate pain.     lisinopril 5 MG tablet  Commonly known as:  ZESTRIL  Take 1 tablet (5 mg total) by mouth daily.     spironolactone 25 MG tablet  Commonly known as:  ALDACTONE  Take 1 tablet (25 mg total) by mouth daily.     warfarin 5 MG tablet  Commonly known as:  COUMADIN  Take 5-10 mg by mouth daily. Take  on Monday, Wednesday, and Friday. All other days take .       Outstanding Labs/Studies  None  Duration of Discharge Encounter   Greater than 30 minutes including physician time.  Signed, Nicolasa Ducking NP 03/28/2014, 5:59  PM

## 2014-03-28 NOTE — CV Procedure (Signed)
Jodan Baptist [id number]  [id number]  Preop KG:MWNUUVOZ cardiomyopathy Postop Dx same/   Procedure:subcutaneous ICD implant with DFT testing  Cx: None   EBL: Minimal    Dictation number [id number]  Sherryl Manges, MD 03/28/2014 10:31 AM

## 2014-03-29 NOTE — Op Note (Signed)
NAME:  Logan [name], STUDLEY NO.:  [id number]  MEDICAL RECORD NO.:  [id number]  LOCATION:  3E05C                        FACILITY:  [facility]  PHYSICIAN:  Duke Salvia, MD, FACCDATE OF BIRTH:  [birth year]  DATE OF PROCEDURE:  03/28/2014 DATE OF DISCHARGE:  03/28/2014                              OPERATIVE REPORT   PREOPERATIVE DIAGNOSIS:  Ischemic cardiomyopathy.  POSTOPERATIVE DIAGNOSIS:  Ischemic cardiomyopathy.  PROCEDURE:  Subcutaneous ICD implantation with intraoperative defibrillation threshold testing.  DESCRIPTION OF PROCEDURE:  Following obtaining informed consent, the patient was submitted to general anesthesia.  After routine prep and drape and fluoroscopic mapping, lidocaine was infiltrated in the curvilinear incision underneath the left breast.  An incision was made and carried down to the layer of the costal fascia, and then dissected to the posterior axillary line to create a pocket for the ICD.  This having been accomplished and hemostasis having been obtained, we then made a 1.5 cm incision horizontally at the caudal aspect of the sternum and this was carried down to the layer of the sternal fascia where two 2- 0 silk sutures were placed for subsequent anchoring.  At this point, a [employer] 3010 subcutaneous ICD lead, serial number [id number] was moved from the lateral incision to the subxiphoid incision by way of a tunneler.  The suturing sleeve was anchored to the lead, but it was at this point held loose.  We then targeted the subcostal destination and the tunneling tool was manipulated along the sternum to this point. Significant difficulty was noted with movement of the tunneler.  We then put the 11-French sheath over the tunneler, but we were unable to advance it past about the mid point.  This was described to the scarring that was associated with his prior bypass surgery.  We then made a 1-cm vertical incision just caudal to the  suprasternal notch.  This was then the target and we then re-tunneled the leads and then pulled it through to the sternal notch incision, and secured the lead at that point.  We then pulled the lead caudal and secured it again at the subxiphoid location.  These 2 small pockets were irrigated with antibiotic- containing saline solution and a 2-0 closure layer was accomplished.  We then attached the lead to a [employer] subcutaneous ICD model 8209 device, serial [id number].  This device was secured to the costal fascia.  The pocket having been copiously irrigated with antibiotic- containing saline solution, the lead was placed posterior to begin and Surgicel was placed on the anterior and posterior aspects of the device for hemostasis as well as for impedance.  We then undertook a 10-joule shock delivered synchronously.  The measured impedance was 44 ohms.  We administered a 2-0 closure layer to the curvilinear inframammary incision.  At this juncture, we undertook defibrillation threshold testing. Ventricular fibrillation was induced via high-frequency pacing.  After a total duration of 12.3 seconds, which did not include the 5 seconds of induction time for a total of 17 seconds, a 65-joule shock was delivered through a measured resistance of 40 ohms terminating the induced ventricular fibrillation restoring sinus rhythm.  At this point, the last three incisions were  closed with a 3-0 layer.  Dermabond dressings were applied.  Needle counts, sponge counts, and instrument counts were correct at the end of the procedure.  Estimated blood loss was minimal.  The patient tolerated the procedure well.     Duke Salvia, MD, [facility]     SCK/MEDQ  D:  03/28/2014  T:  03/28/2014  Job:  [id number]

## 2014-03-30 NOTE — Progress Notes (Signed)
Utilization Review Completed.   Allesandra Huebsch, RN, BSN Nurse Case Manager  

## 2014-04-03 ENCOUNTER — Encounter: Payer: Self-pay | Admitting: *Deleted

## 2014-04-03 DIAGNOSIS — I639 Cerebral infarction, unspecified: Secondary | ICD-10-CM

## 2014-04-03 HISTORY — DX: Cerebral infarction, unspecified: I63.9

## 2014-04-05 ENCOUNTER — Encounter (HOSPITAL_COMMUNITY): Payer: Self-pay | Admitting: *Deleted

## 2014-04-07 ENCOUNTER — Ambulatory Visit (INDEPENDENT_AMBULATORY_CARE_PROVIDER_SITE_OTHER): Payer: Medicaid Other | Admitting: Internal Medicine

## 2014-04-07 VITALS — BP 116/71 | Ht 68.0 in | Wt 161.6 lb

## 2014-04-07 DIAGNOSIS — I255 Ischemic cardiomyopathy: Secondary | ICD-10-CM

## 2014-04-07 DIAGNOSIS — T829XXA Unspecified complication of cardiac and vascular prosthetic device, implant and graft, initial encounter: Secondary | ICD-10-CM

## 2014-04-07 DIAGNOSIS — T82198A Other mechanical complication of other cardiac electronic device, initial encounter: Secondary | ICD-10-CM

## 2014-04-07 LAB — MDC_IDC_ENUM_SESS_TYPE_INCLINIC: MDC IDC PG SERIAL: 105963

## 2014-04-09 NOTE — Progress Notes (Signed)
Seen because of significant oversensing and near inappropriate therapy related to vectors which included the distal electrode We have reprogrammed to the primary vector which had been excluded because of R wave amplitude ( but only barely)  Alternatives would be to reposition the electrode or change device out

## 2014-04-15 ENCOUNTER — Telehealth: Payer: Self-pay | Admitting: Licensed Clinical Social Worker

## 2014-04-15 NOTE — Telephone Encounter (Signed)
CSW contacted patient to follow up on medicaid application and medications. Patient reports his medicaid has been reinstated and he denies any concerns with obtaining medications. CSW will continue to be available if needed. Lasandra Beech, LCSW [phone]

## 2014-04-23 ENCOUNTER — Encounter: Payer: Self-pay | Admitting: Internal Medicine

## 2014-05-05 ENCOUNTER — Encounter: Payer: Medicaid Other | Admitting: Internal Medicine

## 2014-05-06 ENCOUNTER — Emergency Department (HOSPITAL_COMMUNITY): Payer: Medicaid Other

## 2014-05-06 ENCOUNTER — Observation Stay (HOSPITAL_COMMUNITY)
Admission: EM | Admit: 2014-05-06 | Discharge: 2014-05-08 | Disposition: A | Discharge status: Home / Self Care | Payer: Medicaid Other | Attending: Internal Medicine | Admitting: Internal Medicine

## 2014-05-06 ENCOUNTER — Encounter (HOSPITAL_COMMUNITY): Payer: Self-pay | Admitting: *Deleted

## 2014-05-06 DIAGNOSIS — G459 Transient cerebral ischemic attack, unspecified: Secondary | ICD-10-CM | POA: Diagnosis not present

## 2014-05-06 DIAGNOSIS — Z720 Tobacco use: Secondary | ICD-10-CM | POA: Diagnosis not present

## 2014-05-06 DIAGNOSIS — I252 Old myocardial infarction: Secondary | ICD-10-CM | POA: Diagnosis not present

## 2014-05-06 DIAGNOSIS — F1010 Alcohol abuse, uncomplicated: Secondary | ICD-10-CM | POA: Diagnosis present

## 2014-05-06 DIAGNOSIS — Z79899 Other long term (current) drug therapy: Secondary | ICD-10-CM | POA: Insufficient documentation

## 2014-05-06 DIAGNOSIS — I213 ST elevation (STEMI) myocardial infarction of unspecified site: Secondary | ICD-10-CM | POA: Diagnosis not present

## 2014-05-06 DIAGNOSIS — H539 Unspecified visual disturbance: Secondary | ICD-10-CM | POA: Diagnosis not present

## 2014-05-06 DIAGNOSIS — Z95810 Presence of automatic (implantable) cardiac defibrillator: Secondary | ICD-10-CM | POA: Insufficient documentation

## 2014-05-06 DIAGNOSIS — K219 Gastro-esophageal reflux disease without esophagitis: Secondary | ICD-10-CM | POA: Insufficient documentation

## 2014-05-06 DIAGNOSIS — Z9861 Coronary angioplasty status: Secondary | ICD-10-CM | POA: Insufficient documentation

## 2014-05-06 DIAGNOSIS — Z951 Presence of aortocoronary bypass graft: Secondary | ICD-10-CM | POA: Diagnosis not present

## 2014-05-06 DIAGNOSIS — Z9889 Other specified postprocedural states: Secondary | ICD-10-CM | POA: Insufficient documentation

## 2014-05-06 DIAGNOSIS — I340 Nonrheumatic mitral (valve) insufficiency: Secondary | ICD-10-CM | POA: Diagnosis present

## 2014-05-06 DIAGNOSIS — I5022 Chronic systolic (congestive) heart failure: Secondary | ICD-10-CM | POA: Diagnosis not present

## 2014-05-06 DIAGNOSIS — Z7982 Long term (current) use of aspirin: Secondary | ICD-10-CM | POA: Diagnosis not present

## 2014-05-06 DIAGNOSIS — I2510 Atherosclerotic heart disease of native coronary artery without angina pectoris: Secondary | ICD-10-CM | POA: Diagnosis not present

## 2014-05-06 DIAGNOSIS — Z7901 Long term (current) use of anticoagulants: Secondary | ICD-10-CM | POA: Insufficient documentation

## 2014-05-06 DIAGNOSIS — Z880 Allergy status to penicillin: Secondary | ICD-10-CM | POA: Diagnosis not present

## 2014-05-06 DIAGNOSIS — Z91199 Patient's noncompliance with other medical treatment and regimen due to unspecified reason: Secondary | ICD-10-CM

## 2014-05-06 DIAGNOSIS — R200 Anesthesia of skin: Secondary | ICD-10-CM | POA: Insufficient documentation

## 2014-05-06 DIAGNOSIS — I255 Ischemic cardiomyopathy: Secondary | ICD-10-CM | POA: Diagnosis not present

## 2014-05-06 DIAGNOSIS — G458 Other transient cerebral ischemic attacks and related syndromes: Secondary | ICD-10-CM

## 2014-05-06 DIAGNOSIS — E785 Hyperlipidemia, unspecified: Secondary | ICD-10-CM | POA: Diagnosis not present

## 2014-05-06 DIAGNOSIS — Z9119 Patient's noncompliance with other medical treatment and regimen: Secondary | ICD-10-CM

## 2014-05-06 DIAGNOSIS — Z23 Encounter for immunization: Secondary | ICD-10-CM | POA: Diagnosis not present

## 2014-05-06 DIAGNOSIS — R4701 Aphasia: Secondary | ICD-10-CM | POA: Diagnosis present

## 2014-05-06 DIAGNOSIS — I513 Intracardiac thrombosis, not elsewhere classified: Secondary | ICD-10-CM | POA: Diagnosis present

## 2014-05-06 LAB — RAPID URINE DRUG SCREEN, HOSP PERFORMED
AMPHETAMINES: NOT DETECTED
BARBITURATES: NOT DETECTED
Benzodiazepines: NOT DETECTED
COCAINE: NOT DETECTED
OPIATES: NOT DETECTED
TETRAHYDROCANNABINOL: NOT DETECTED

## 2014-05-06 LAB — COMPREHENSIVE METABOLIC PANEL
ALBUMIN: 3.8 g/dL (ref 3.5–5.2)
ALK PHOS: 96 U/L (ref 39–117)
ALT: 16 U/L (ref 0–53)
AST: 21 U/L (ref 0–37)
Anion gap: 13 (ref 5–15)
BUN: 12 mg/dL (ref 6–23)
CALCIUM: 9.3 mg/dL (ref 8.4–10.5)
CO2: 21 mEq/L (ref 19–32)
Chloride: 105 mEq/L (ref 96–112)
Creatinine, Ser: 0.87 mg/dL (ref 0.50–1.35)
GFR calc Af Amer: >90 mL/min (ref >90)
GFR calc non Af Amer: >90 mL/min (ref >90)
Glucose, Bld: 98 mg/dL (ref 70–99)
POTASSIUM: 4.1 meq/L (ref 3.7–5.3)
SODIUM: 139 meq/L (ref 137–147)
TOTAL PROTEIN: 7 g/dL (ref 6.0–8.3)
Total Bilirubin: 0.2 mg/dL — ABNORMAL LOW (ref 0.3–1.2)

## 2014-05-06 LAB — CBC
HCT: 37.1 % — ABNORMAL LOW (ref 39.0–52.0)
HEMOGLOBIN: 12.8 g/dL — AB (ref 13.0–17.0)
MCH: 33.8 pg (ref 26.0–34.0)
MCHC: 34.5 g/dL (ref 30.0–36.0)
MCV: 97.9 fL (ref 78.0–100.0)
PLATELETS: 291 10*3/uL (ref 150–400)
RBC: 3.79 MIL/uL — ABNORMAL LOW (ref 4.22–5.81)
RDW: 13.2 % (ref 11.5–15.5)
WBC: 7.1 10*3/uL (ref 4.0–10.5)

## 2014-05-06 LAB — PROTIME-INR
INR: 1.07 (ref 0.00–1.49)
Prothrombin Time: 14 seconds (ref 11.6–15.2)

## 2014-05-06 MED ORDER — ASPIRIN EC 81 MG PO TBEC
81.0000 mg | DELAYED_RELEASE_TABLET | Freq: Every day | ORAL | Status: DC
  Administered 2014-05-07 – 2014-05-08 (×2): 81 mg via ORAL
  Filled 2014-05-06 (×2): qty 1

## 2014-05-06 MED ORDER — LISINOPRIL 5 MG PO TABS
5.0000 mg | ORAL_TABLET | Freq: Every day | ORAL | Status: DC
  Administered 2014-05-07 – 2014-05-08 (×2): 5 mg via ORAL
  Filled 2014-05-06 (×2): qty 1

## 2014-05-06 MED ORDER — SPIRONOLACTONE 25 MG PO TABS
25.0000 mg | ORAL_TABLET | Freq: Every day | ORAL | Status: DC
  Administered 2014-05-07 – 2014-05-08 (×2): 25 mg via ORAL
  Filled 2014-05-06 (×2): qty 1

## 2014-05-06 MED ORDER — ATORVASTATIN CALCIUM 40 MG PO TABS
80.0000 mg | ORAL_TABLET | Freq: Every day | ORAL | Status: DC
  Administered 2014-05-06 – 2014-05-07 (×2): 80 mg via ORAL
  Filled 2014-05-06 (×2): qty 2

## 2014-05-06 MED ORDER — STROKE: EARLY STAGES OF RECOVERY BOOK
Freq: Once | Status: AC
  Administered 2014-05-07: 12:00:00
  Filled 2014-05-06: qty 1

## 2014-05-06 MED ORDER — ASPIRIN 81 MG PO CHEW
324.0000 mg | CHEWABLE_TABLET | Freq: Once | ORAL | Status: AC
  Administered 2014-05-06: 324 mg via ORAL
  Filled 2014-05-06: qty 4

## 2014-05-06 MED ORDER — WARFARIN SODIUM 5 MG PO TABS
7.5000 mg | ORAL_TABLET | ORAL | Status: DC
  Administered 2014-05-06: 7.5 mg via ORAL
  Filled 2014-05-06: qty 2

## 2014-05-06 MED ORDER — WARFARIN - PHYSICIAN DOSING INPATIENT: Freq: Every day | Status: DC

## 2014-05-06 MED ORDER — ENOXAPARIN SODIUM 80 MG/0.8ML ~~LOC~~ SOLN
70.0000 mg | Freq: Two times a day (BID) | SUBCUTANEOUS | Status: DC
  Administered 2014-05-06 – 2014-05-08 (×4): 70 mg via SUBCUTANEOUS
  Filled 2014-05-06 (×4): qty 0.8

## 2014-05-06 MED ORDER — CARVEDILOL 3.125 MG PO TABS
6.2500 mg | ORAL_TABLET | Freq: Two times a day (BID) | ORAL | Status: DC
  Administered 2014-05-07 – 2014-05-08 (×3): 6.25 mg via ORAL
  Filled 2014-05-06 (×3): qty 2

## 2014-05-06 MED ORDER — FUROSEMIDE 20 MG PO TABS
20.0000 mg | ORAL_TABLET | Freq: Every day | ORAL | Status: DC
  Administered 2014-05-07 – 2014-05-08 (×2): 20 mg via ORAL
  Filled 2014-05-06 (×2): qty 1

## 2014-05-06 MED ORDER — WARFARIN SODIUM 5 MG PO TABS
5.0000 mg | ORAL_TABLET | ORAL | Status: DC
  Administered 2014-05-07: 5 mg via ORAL
  Filled 2014-05-06: qty 1

## 2014-05-06 NOTE — ED Notes (Signed)
Pt right hand tingling 15-20 minutes ago, right side weaker than left per pt, no facial droop noted, slurred speech

## 2014-05-06 NOTE — Progress Notes (Signed)
ANTICOAGULATION CONSULT NOTE - Initial Consult  Pharmacy Consult for Lovenox Indication: stroke  Allergies  Allergen Reactions  . Adhesive [Tape] Rash    "paper tape is ok" (07/09/2012)  . Bee Venom Anaphylaxis  . Penicillins Rash   Patient Measurements: Height: 5\' 8"  (172.7 cm) Weight: 160 lb (72.576 kg) IBW/kg (Calculated) : 68.4  Vital Signs: Temp: 98.4 F (36.9 C) (11/03 1804) Temp Source: Oral (11/03 1800) BP: 103/74 mmHg (11/03 1910) Pulse Rate: 65 (11/03 1910)  Labs:  Recent Labs  05/06/14 1757  HGB 12.8*  HCT 37.1*  PLT 291  LABPROT 14.0  INR 1.07  CREATININE 0.87   Estimated Creatinine Clearance: 111.4 mL/min (by C-G formula based on Cr of 0.87).  Medical History: Past Medical History  Diagnosis Date  . GERD (gastroesophageal reflux disease)   . CAD (coronary artery disease) 01/22/12    a.anterolateral MI with DES to LAD EF 25% b. reinfarction 8/13 with thrombosis of stent c. s/p CABG x 3: LIMA-LAD, SVG-OM, SVG-RCA (07/2012)   . Hyperlipidemia   . Tobacco abuse   . Chronic systolic CHF (congestive heart failure)     a. (07/2012) EF 20-25% diff HK with reg wall abnl, HK anterolat wall, akinesis inferosep, mod MR;  b. 01/2014 Echo: EF 10-15%, glob HK, mod MR.  . Noncompliance   . Syncope   . LV (left ventricular) mural thrombus     "just found it in Oct" (07/09/2012)  . Ischemic cardiomyopathy     a.  01/2014 Echo: EF 10-15%, glob HK;  b. 03/2014 S/P Subcutaneous ICD.  Marland Kitchen Anginal pain   . Moderate mitral regurgitation   . Myocardial infarction 01/2012; 02/2012; 04/2012   Medications:   (Not in a hospital admission)  Assessment: 38yo male on chronic Coumadin PTA.  INR is below goal on admission.  Pt c/o hand tingling.  Asked to initiate Lovenox for suspected TIA and subtherapeutic INR.  Goal of Therapy:  Anti-Xa level 0.6-1 units/ml 4hrs after LMWH dose given Monitor platelets by anticoagulation protocol: Yes   Plan:  Lovenox 70mg  (1mg /Kg) SQ  q12hrs Monitor CBC  Logan [name] A 05/06/2014,7:36 PM
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
## 2014-05-07 ENCOUNTER — Observation Stay (HOSPITAL_COMMUNITY): Payer: Medicaid Other

## 2014-05-07 ENCOUNTER — Encounter: Payer: Self-pay | Admitting: Internal Medicine

## 2014-05-07 DIAGNOSIS — F1010 Alcohol abuse, uncomplicated: Secondary | ICD-10-CM

## 2014-05-07 DIAGNOSIS — I059 Rheumatic mitral valve disease, unspecified: Secondary | ICD-10-CM

## 2014-05-07 LAB — HEMOGLOBIN A1C
HEMOGLOBIN A1C: 5.4 % (ref <5.7)
MEAN PLASMA GLUCOSE: 108 mg/dL (ref <117)

## 2014-05-07 LAB — CBC
HEMATOCRIT: 37.6 % — AB (ref 39.0–52.0)
HEMOGLOBIN: 12.9 g/dL — AB (ref 13.0–17.0)
MCH: 34 pg (ref 26.0–34.0)
MCHC: 34.3 g/dL (ref 30.0–36.0)
MCV: 99.2 fL (ref 78.0–100.0)
Platelets: 288 10*3/uL (ref 150–400)
RBC: 3.79 MIL/uL — AB (ref 4.22–5.81)
RDW: 13.3 % (ref 11.5–15.5)
WBC: 6.7 10*3/uL (ref 4.0–10.5)

## 2014-05-07 LAB — LIPID PANEL
CHOLESTEROL: 187 mg/dL (ref 0–200)
HDL: 37 mg/dL — ABNORMAL LOW (ref >39)
LDL Cholesterol: 129 mg/dL — ABNORMAL HIGH (ref 0–99)
TRIGLYCERIDES: 107 mg/dL (ref <150)
Total CHOL/HDL Ratio: 5.1 RATIO
VLDL: 21 mg/dL (ref 0–40)

## 2014-05-07 MED ORDER — INFLUENZA VAC SPLIT QUAD 0.5 ML IM SUSY
0.5000 mL | PREFILLED_SYRINGE | INTRAMUSCULAR | Status: AC
  Administered 2014-05-08: 0.5 mL via INTRAMUSCULAR
  Filled 2014-05-07: qty 0.5

## 2014-05-07 MED ORDER — HYDROCODONE-ACETAMINOPHEN 5-325 MG PO TABS: 1.0000 | ORAL_TABLET | Freq: Four times a day (QID) | ORAL | Status: DC | PRN

## 2014-05-07 NOTE — Care Management Note (Signed)
    Page 1 of 1   05/08/2014     11:04:59 AM CARE MANAGEMENT NOTE 05/08/2014  Patient:  Logan [name], Logan [name]   Account Number:  [id number]  Date Initiated:  05/07/2014  Documentation initiated by:  Sharrie Rothman  Subjective/Objective Assessment:   Pt admitted from home with tia. Pt lives with his wife and will return home at discharge. Pt is independent with ADl's. PTs PT[facility]INR [facility] in [place].     Action/Plan:   Will continue to follow for discharge planning needs. Pt may need lovenox at discharge. Pts wife stated she would be able to give them to the pt.   Anticipated DC Date:  12-30-202015   Anticipated DC Plan:  HOME/SELF CARE      DC Planning Services  CM consult      Choice offered to / List presented to:             Status of service:  Completed, signed off Medicare Important Message given?   (If response is "NO", the following Medicare IM given date fields will be blank) Date Medicare IM given:   Medicare IM given by:   Date Additional Medicare IM given:   Additional Medicare IM given by:    Discharge Disposition:  HOME/SELF CARE  Per UR Regulation:    If discussed at Long Length of Stay Meetings, dates discussed:    Comments:  05/08/14 1100 Arlyss Queen, RN BSN CM Pt discharging home today with Lovenox. MD aware to write prescription for brand name so it will be covered by insurance. Pts and pts wife will be educated on administration. Pt has followup appts in place for PT/INR check. No other CM needs noted.  05/07/14 1140 Arlyss Queen, RN BSN CM

## 2014-05-07 NOTE — Progress Notes (Signed)
ANTICOAGULATION CONSULT NOTE  Pharmacy Consult for Lovenox Indication: LV mural thrombus, subtheraputic INR  Allergies  Allergen Reactions  . Adhesive [Tape] Rash    "paper tape is ok" (07/09/2012)  . Bee Venom Anaphylaxis  . Penicillins Rash   Patient Measurements: Height: 5\' 8"  (172.7 cm) Weight: 163 lb 6.4 oz (74.118 kg) IBW/kg (Calculated) : 68.4  Vital Signs: Temp: 97.9 F (36.6 C) (11/04 0923) Temp Source: Oral (11/04 0923) BP: 107/64 mmHg (11/04 0923) Pulse Rate: 62 (11/04 0923)  Labs:  Recent Labs  05/06/14 1757 05/07/14 0518  HGB 12.8* 12.9*  HCT 37.1* 37.6*  PLT 291 288  LABPROT 14.0  --   INR 1.07  --   CREATININE 0.87  --    Estimated Creatinine Clearance: 111.4 mL/min (by C-G formula based on Cr of 0.87).  Medical History: Past Medical History  Diagnosis Date  . GERD (gastroesophageal reflux disease)   . CAD (coronary artery disease) 01/22/12    a.anterolateral MI with DES to LAD EF 25% b. reinfarction 8/13 with thrombosis of stent c. s/p CABG x 3: LIMA-LAD, SVG-OM, SVG-RCA (07/2012)   . Hyperlipidemia   . Tobacco abuse   . Chronic systolic CHF (congestive heart failure)     a. (07/2012) EF 20-25% diff HK with reg wall abnl, HK anterolat wall, akinesis inferosep, mod MR;  b. 01/2014 Echo: EF 10-15%, glob HK, mod MR.  . Noncompliance   . Syncope   . LV (left ventricular) mural thrombus     "just found it in Oct" (07/09/2012)  . Ischemic cardiomyopathy     a.  01/2014 Echo: EF 10-15%, glob HK;  b. 03/2014 S/P Subcutaneous ICD.  Marland Kitchen Anginal pain   . Moderate mitral regurgitation   . Myocardial infarction 01/2012; 02/2012; 04/2012   Medications:  Prescriptions prior to admission  Medication Sig Dispense Refill Last Dose  . aspirin EC 81 MG tablet Take 1 tablet (81 mg total) by mouth daily. 30 tablet 6 05/04/2014 at Unknown time  . atorvastatin (LIPITOR) 80 MG tablet Take 80 mg by mouth daily at 6 PM.    05/04/2014 at Unknown time  . carvedilol (COREG) 6.25 MG  tablet Take 1 tablet (6.25 mg total) by mouth 2 (two) times daily with a meal. 60 tablet 6 05/04/2014 at Unknown time  . furosemide (LASIX) 20 MG tablet Take 1 tablet (20 mg total) by mouth daily. 30 tablet 3 05/04/2014 at Unknown time  . lisinopril (ZESTRIL) 5 MG tablet Take 1 tablet (5 mg total) by mouth daily. 30 tablet 6 05/04/2014 at Unknown time  . spironolactone (ALDACTONE) 25 MG tablet Take 1 tablet (25 mg total) by mouth daily. 30 tablet 3 05/04/2014 at Unknown time  . warfarin (COUMADIN) 5 MG tablet Take 5-7.5 mg by mouth daily. Take 5mg  on Monday, Wednesday,  Friday, Saturday and Sunday. Take one and one half tablet on all other days   05/04/2014 at Unknown time  . HYDROcodone-acetaminophen (NORCO/VICODIN) 5-325 MG per tablet Take 1 tablet by mouth every 6 (six) hours as needed for moderate pain. (Patient not taking: Reported on 05/06/2014) 6 tablet 0 Taking    Assessment: 37yo male on chronic Coumadin PTA.  INR is below goal on admission.  Hand tingling resolved.  Hx LV Mural Thrombus, w/ subtherapeutic INR.  Goal of Therapy:  Anti-Xa level 0.6-1 units/ml 4hrs after LMWH dose given if clinically indicated. Monitor platelets by anticoagulation protocol: Yes   Plan:  Lovenox 70mg  (1mg /Kg) SQ q12hrs Monitor CBC Warfarin  Resumed per MD. Labs ordered.  Lamonte RicherHayes, Kataleyah Carducci R 05/07/2014,10:37 AM

## 2014-05-07 NOTE — Progress Notes (Signed)
[facility] PROGRESS NOTE Assessment/Plan: Transient ischemic attack: - Started on Lovenox, resume coumadin. - 2-d echo pending, INR sub therapeutic 1.0. - Cont ASA.  S/P CABG (coronary artery bypass graft) - due to s/p anterolateral MI in 7/13 with DES to LAD; reinfar[place] 02/05/12 [place] underwent repeat stenting. Then s/p CABG x 3.  ETOH abuse: - Monitor for withdrawal, no evidence at this time. - Nicotine dependence, previous ETOH abuse.  Moderate mitral regurgitation:  LV (left ventricular) mural thrombus: - Full lovenox, restart coumadin. Ck echo.  Noncompliance:  Chronic systolic congestive heart failure - Ischemic cardiomyopathy (EF 15% echo 10/14). - On ASA, statins, coreg lasix, lisinopril. Aldactone.  CAD (coronary artery disease):  Code Status: full Family Communication: none  Disposition Plan: inpatient   Consultants:  none  Procedures:  Ct head  Antibiotics:  None  HPI/Subjective: Feels better symptoms resolved.  Objective: Filed Vitals:   05/06/14 2030 05/06/14 2122 05/07/14 0502 05/07/14 0655  BP: 104/71 114/62 103/54 94/57  Pulse: 63 73 58 59  Temp:  98.5 F (36.9 C) 97.7 F (36.5 C) 98.3 F (36.8 C)  TempSrc:  Oral Oral Oral  Resp: 21 20 20 18   Height:  5\' 8"  (1.727 m)    Weight:  74.118 kg (163 lb 6.4 oz)    SpO2: 92% 99% 95% 96%    Intake/Output Summary (Last 24 hours) at 05/07/14 0902 Last data filed at 05/06/14 2057  Gross per 24 hour  Intake      0 ml  Output    500 ml  Net   -500 ml   Filed Weights   05/06/14 1736 05/06/14 2122  Weight: 72.576 kg (160 lb) 74.118 kg (163 lb 6.4 oz)    Exam:  General: Alert, awake, oriented x3, in no acute distress.  HEENT: No bruits, no goiter.  Heart: Regular rate and rhythm. Lungs: Good air movement, clear Abdomen: Soft, nontender, nondistended, positive bowel sounds.   Data Reviewed: Basic Metabolic Panel:  Recent  Labs Lab 05/06/14 1757  NA 139  K 4.1  CL 105  CO2 21  GLUCOSE 98  BUN 12  CREATININE 0.87  CALCIUM 9.3   Liver Function Tests:  Recent Labs Lab 05/06/14 1757  AST 21  ALT 16  ALKPHOS 96  BILITOT 0.2*  PROT 7.0  ALBUMIN 3.8   No results for input(s): LIPASE, AMYLASE in the last 168 hours. No results for input(s): AMMONIA in the last 168 hours. CBC:  Recent Labs Lab 05/06/14 1757 05/07/14 0518  WBC 7.1 6.7  HGB 12.8* 12.9*  HCT 37.1* 37.6*  MCV 97.9 99.2  PLT 291 288   Cardiac Enzymes: No results for input(s): CKTOTAL, CKMB, CKMBINDEX, TROPONINI in the last 168 hours. BNP (last 3 results) No results for input(s): PROBNP in the last 8760 hours. CBG: No results for input(s): GLUCAP in the last 168 hours.  No results found for this or any previous visit (from the past 240 hour(s)).   Studies: Ct Head Wo Contrast  05/06/2014   CLINICAL DATA:  Right hand tingling and slurred speech and blurry vision.  EXAM: CT HEAD WITHOUT CONTRAST  TECHNIQUE: Contiguous axial images were obtained from the base of the skull through the vertex without contrast.  COMPARISON:  None  FINDINGS: No evidence for acute hemorrhage, mass lesion, midline shift, hydrocephalus or large infarct. No acute bone abnormality. Visualized sinuses are clear.  IMPRESSION: Negative head CT.   Electronically Signed  By: Richarda Overlie M.D.   On: 05/06/2014 19:06    Scheduled Meds: .  stroke: mapping our early stages of recovery book   Does not apply Once  . aspirin EC  81 mg Oral Daily  . atorvastatin  80 mg Oral q1800  . carvedilol  6.25 mg Oral BID WC  . enoxaparin (LOVENOX) injection  70 mg Subcutaneous Q12H  . furosemide  20 mg Oral Daily  . lisinopril  5 mg Oral Daily  . spironolactone  25 mg Oral Daily  . warfarin  5 mg Oral Once per day on Sun Mon Wed Fri Sat  . warfarin  7.5 mg Oral Once per day on Tue Thu  . Warfarin - Physician Dosing Inpatient   Does not apply q1800   Continuous Infusions:     Marinda Elk  [facility] Pager [phone]. If 8PM-8AM, please contact night-coverage at [email or web address], password [facility] 05/07/2014, 9:02 AM  LOS: 1 day

## 2014-05-07 NOTE — Progress Notes (Signed)
UR completed 

## 2014-05-07 NOTE — Plan of Care (Signed)
Problem: Acute Treatment Outcomes Goal: Neuro exam at baseline or improved Outcome: Completed/Met Date Met:  05/07/14 No deficits Goal: BP within ordered parameters Outcome: Completed/Met Date Met:  05/07/14 Goal: Airway maintained/protected Outcome: Completed/Met Date Met:  05/07/14 Goal: Hemodynamically stable Outcome: Completed/Met Date Met:  05/07/14 Goal: tPA Patient w/o S&S of bleeding Outcome: Not Applicable Date Met:  08/26/34 Goal: Prognosis discussed with family/patient as appropriate Outcome: Progressing Goal: Other Acute Treatment Outcomes Outcome: Progressing

## 2014-05-07 NOTE — Progress Notes (Signed)
  Echocardiogram 2D Echocardiogram has been performed.  Logan [name] 05/07/2014, 11:33 AM

## 2014-05-08 ENCOUNTER — Encounter: Payer: Medicaid Other | Admitting: Internal Medicine

## 2014-05-08 ENCOUNTER — Other Ambulatory Visit (HOSPITAL_COMMUNITY): Payer: Self-pay | Admitting: Anesthesiology

## 2014-05-08 LAB — PROTIME-INR
INR: 1.08 (ref 0.00–1.49)
Prothrombin Time: 14.1 seconds (ref 11.6–15.2)

## 2014-05-08 LAB — CBC
HEMATOCRIT: 39.4 % (ref 39.0–52.0)
HEMOGLOBIN: 13.4 g/dL (ref 13.0–17.0)
MCH: 34 pg (ref 26.0–34.0)
MCHC: 34 g/dL (ref 30.0–36.0)
MCV: 100 fL (ref 78.0–100.0)
PLATELETS: 284 10*3/uL (ref 150–400)
RBC: 3.94 MIL/uL — AB (ref 4.22–5.81)
RDW: 13.3 % (ref 11.5–15.5)
WBC: 6.9 10*3/uL (ref 4.0–10.5)

## 2014-05-08 MED ORDER — WARFARIN SODIUM 7.5 MG PO TABS: 7.5000 mg | ORAL_TABLET | Freq: Once | ORAL | Status: DC

## 2014-05-08 MED ORDER — ENOXAPARIN (LOVENOX) PATIENT EDUCATION KIT
PACK | Freq: Once | Status: DC
  Filled 2014-05-08: qty 1

## 2014-05-08 MED ORDER — ENOXAPARIN SODIUM 80 MG/0.8ML ~~LOC~~ SOLN: 1.5000 mg/kg | Freq: Two times a day (BID) | SUBCUTANEOUS | Status: DC

## 2014-05-08 NOTE — Discharge Summary (Signed)
Physician Discharge Summary  Logan [name] [id number] DOB: 10/14/1975 DOA: 05/06/2014  PCP: No primary care provider on file.  Admit date: 05/06/2014 Discharge date: 05/08/2014  Time spent: 35 minutes  Recommendations for Outpatient Follow-up:  1. Follow up with cardiology on 11. 9.2015 for INR follow up  Discharge Diagnoses:  Principal Problem:   Transient ischemic attack Active Problems:   CAD (coronary artery disease)   Chronic systolic congestive heart failure   LV (left ventricular) mural thrombus   Noncompliance   ETOH abuse   Moderate mitral regurgitation   S/P CABG (coronary artery bypass graft)   Brain TIA   Discharge Condition: stable  Diet recommendation: heart healthy  Filed Weights   05/06/14 1736 05/06/14 2122  Weight: 72.576 kg (160 lb) 74.118 kg (163 lb 6.4 oz)    History of present illness:  38 yo male h/o cabg 2014 s/p aicd in July 15 EF 10%, LV mural thrombus on coumadin but ran out 2 days ago, moderate mitral regurgitation comes in with sudden aphasia while at [place] with associated blurred vision and right hand n/t. Pt immediately came to ED and his symptoms resolved in less than an hour and is asymptomatic at this time. He ran out of all of his medications about 2 days ago but says he takes them as he should otherwise. No recent illnesses. No focal neuro deficits at this time. He did not feel his aicd fire.  Hospital Course:  Transient ischemic attack: - Started on Lovenox, resume coumadin on admision - 2-d echo: The estimated ejection fraction was in the range of 10% to 15%.INR sub therapeutic 1.0. - Cont ASA. Home on Lovenox and coumdain. - follow up with cardiology clinic on 11.9.for INR follow up.  S/P CABG (coronary artery bypass graft) - due to s/p anterolateral MI in 7/13 with DES to LAD; reinfar[place] 02/05/12 [place] underwent repeat stenting. Then s/p CABG x 3.  ETOH abuse: - Monitor for  withdrawal, no evidence at this time. - Nicotine dependence, previous ETOH abuse.  Moderate mitral regurgitation:  LV (left ventricular) mural thrombus: - Full lovenox, restart coumadin. Ck echo.  Noncompliance:  Chronic systolic congestive heart failure - Ischemic cardiomyopathy (EF 15% echo 10/14). - On ASA, statins, coreg lasix, lisinopril. Aldactone.  CAD (coronary artery disease):  Procedures:  Carotid  Echo  Consultations:  noen  Discharge Exam: Filed Vitals:   05/08/14 0756  BP: 106/62  Pulse: 62  Temp: 98.1 F (36.7 C)  Resp: 20    General: A&O x3 Cardiovascular: RRR Respiratory: good air movement CTA B/L  Discharge Instructions You were cared for by a hospitalist during your hospital stay. If you have any questions about your discharge medications or the care you received while you were in the hospital after you are discharged, you can call the unit and asked to speak with the hospitalist on call if the hospitalist that took care of you is not available. Once you are discharged, your primary care physician will handle any further medical issues. Please note that NO REFILLS for any discharge medications will be authorized once you are discharged, as it is imperative that you return to your primary care physician (or establish a relationship with a primary care physician if you do not have one) for your aftercare needs so that they can reassess your need for medications and monitor your lab values.  Discharge Instructions    Diet - low sodium heart healthy  Complete by:  As directed      Increase activity slowly    Complete by:  As directed           Current Discharge Medication List    START taking these medications   Details  enoxaparin (LOVENOX) 80 MG/0.8ML injection Inject 1.1 mLs (110 mg total) into the skin every 12 (twelve) hours. Qty: 5 Syringe, Refills: 0    !! warfarin (COUMADIN) 7.5 MG tablet Take 1 tablet (7.5 mg total) by mouth one time  only at 6 PM. Qty: 30 tablet, Refills: 0     !! - Potential duplicate medications found. Please discuss with provider.    CONTINUE these medications which have NOT CHANGED   Details  aspirin EC 81 MG tablet Take 1 tablet (81 mg total) by mouth daily. Qty: 30 tablet, Refills: 6    atorvastatin (LIPITOR) 80 MG tablet Take 80 mg by mouth daily at 6 PM.     carvedilol (COREG) 6.25 MG tablet Take 1 tablet (6.25 mg total) by mouth 2 (two) times daily with a meal. Qty: 60 tablet, Refills: 6   Associated Diagnoses: Chronic systolic heart failure    furosemide (LASIX) 20 MG tablet Take 1 tablet (20 mg total) by mouth daily. Qty: 30 tablet, Refills: 3   Associated Diagnoses: Coronary artery disease due to calcified coronary lesion    lisinopril (ZESTRIL) 5 MG tablet Take 1 tablet (5 mg total) by mouth daily. Qty: 30 tablet, Refills: 6   Associated Diagnoses: Chronic systolic heart failure    spironolactone (ALDACTONE) 25 MG tablet Take 1 tablet (25 mg total) by mouth daily. Qty: 30 tablet, Refills: 3   Associated Diagnoses: Coronary artery disease due to calcified coronary lesion    !! warfarin (COUMADIN) 5 MG tablet Take 5-7.5 mg by mouth daily. Take 5mg  on Monday, Wednesday,  Friday, Saturday and Sunday. Take one and one half tablet on all other days    HYDROcodone-acetaminophen (NORCO/VICODIN) 5-325 MG per tablet Take 1 tablet by mouth every 6 (six) hours as needed for moderate pain. Qty: 6 tablet, Refills: 0     !! - Potential duplicate medications found. Please discuss with provider.     Allergies  Allergen Reactions  . Adhesive [Tape] Rash    "paper tape is ok" (07/09/2012)  . Bee Venom Anaphylaxis  . Penicillins Rash   Follow-up Information    Follow up with Joni Reining, NP On 05/15/2014.   Specialty:  Nurse Practitioner   Why:  at 2:10 pm   Contact information:   [address] [place] [address] [phone]        The results of significant diagnostics from  this hospitalization (including imaging, microbiology, ancillary and laboratory) are listed below for reference.    Significant Diagnostic Studies: Ct Head Wo Contrast  05/06/2014   CLINICAL DATA:  Right hand tingling and slurred speech and blurry vision.  EXAM: CT HEAD WITHOUT CONTRAST  TECHNIQUE: Contiguous axial images were obtained from the base of the skull through the vertex without contrast.  COMPARISON:  None  FINDINGS: No evidence for acute hemorrhage, mass lesion, midline shift, hydrocephalus or large infarct. No acute bone abnormality. Visualized sinuses are clear.  IMPRESSION: Negative head CT.   Electronically Signed   By: Richarda Overlie M.D.   On: 05/06/2014 19:06   US Carotid Bilateral  05/07/2014   CLINICAL DATA:  38 year old male with symptoms of transient ischemic attack.  EXAM: BILATERAL CAROTID DUPLEX ULTRASOUND  TECHNIQUE: Wallace Cullens  scale imaging, color Doppler and duplex ultrasound were performed of bilateral carotid and vertebral arteries in the neck.  COMPARISON:  None.  FINDINGS: Criteria: Quantification of carotid stenosis is based on velocity parameters that correlate the residual internal carotid diameter with NASCET-based stenosis levels, using the diameter of the distal internal carotid lumen as the denominator for stenosis measurement.  The following velocity measurements were obtained:  RIGHT  ICA:  72/30 cm/sec  CCA:  93/25 cm/sec  SYSTOLIC ICA/CCA RATIO:  0.8  DIASTOLIC ICA/CCA RATIO:  1.2  ECA:  85 cm/sec  LEFT  ICA:  81/35 cm/sec  CCA:  100/25 cm/sec  SYSTOLIC ICA/CCA RATIO:  0.8  DIASTOLIC ICA/CCA RATIO:  1.4  ECA:  80 cm/sec  RIGHT CAROTID ARTERY: Trace heterogeneous atherosclerotic plaque in the carotid bulb without evidence of stenosis.  RIGHT VERTEBRAL ARTERY:  Patent with normal antegrade flow.  LEFT CAROTID ARTERY: No significant atherosclerotic plaque or evidence of stenosis.  LEFT VERTEBRAL ARTERY:  Patent with normal antegrade flow.  IMPRESSION: 1. Trace heterogeneous  atherosclerotic plaque in the right carotid bulb without evidence of stenosis. 2. No significant plaque or evidence of stenosis on the left. 3. Vertebral arteries are patent with normal antegrade flow. Signed,  Sterling Big, MD  [facility]  [facility]   Electronically Signed   By: Malachy Moan M.D.   On: 05/07/2014 12:23    Microbiology: No results found for this or any previous visit (from the past 240 hour(s)).   Labs: Basic Metabolic Panel:  Recent Labs Lab 05/06/14 1757  NA 139  K 4.1  CL 105  CO2 21  GLUCOSE 98  BUN 12  CREATININE 0.87  CALCIUM 9.3   Liver Function Tests:  Recent Labs Lab 05/06/14 1757  AST 21  ALT 16  ALKPHOS 96  BILITOT 0.2*  PROT 7.0  ALBUMIN 3.8   No results for input(s): LIPASE, AMYLASE in the last 168 hours. No results for input(s): AMMONIA in the last 168 hours. CBC:  Recent Labs Lab 05/06/14 1757 05/07/14 0518 05/08/14 0506  WBC 7.1 6.7 6.9  HGB 12.8* 12.9* 13.4  HCT 37.1* 37.6* 39.4  MCV 97.9 99.2 100.0  PLT 291 288 284   Cardiac Enzymes: No results for input(s): CKTOTAL, CKMB, CKMBINDEX, TROPONINI in the last 168 hours. BNP: BNP (last 3 results) No results for input(s): PROBNP in the last 8760 hours. CBG: No results for input(s): GLUCAP in the last 168 hours.    Signed:  Marinda Elk  [facility] 05/08/2014, 9:42 AM

## 2014-05-08 NOTE — Plan of Care (Signed)
Problem: Progression Outcomes Goal: If vent dependent, tolerates weaning Outcome: Not Applicable Date Met:  42/70/62

## 2014-05-08 NOTE — Progress Notes (Signed)
Logan [name] discharged home per MD order.  Discharge instructions reviewed and discussed with the patient, all questions and concerns answered. Copy of instructions and scripts given to patient. Patient and wife were educated on lovenox injection prior to discharge and have no questions at this time.    Medication List    TAKE these medications        aspirin EC 81 MG tablet  Take 1 tablet (81 mg total) by mouth daily.     atorvastatin 80 MG tablet  Commonly known as:  LIPITOR  Take 80 mg by mouth daily at 6 PM.     carvedilol 6.25 MG tablet  Commonly known as:  COREG  Take 1 tablet (6.25 mg total) by mouth 2 (two) times daily with a meal.     enoxaparin 80 MG/0.8ML injection  Commonly known as:  LOVENOX  Inject 1.1 mLs (110 mg total) into the skin every 12 (twelve) hours.     furosemide 20 MG tablet  Commonly known as:  LASIX  Take 1 tablet (20 mg total) by mouth daily.     HYDROcodone-acetaminophen 5-325 MG per tablet  Commonly known as:  NORCO/VICODIN  Take 1 tablet by mouth every 6 (six) hours as needed for moderate pain.     lisinopril 5 MG tablet  Commonly known as:  ZESTRIL  Take 1 tablet (5 mg total) by mouth daily.     spironolactone 25 MG tablet  Commonly known as:  ALDACTONE  Take 1 tablet (25 mg total) by mouth daily.     warfarin 5 MG tablet  Commonly known as:  COUMADIN  Take 5-7.5 mg by mouth daily. Take 5mg  on Monday, Wednesday,  Friday, Saturday and Sunday. Take one and one half tablet on all other days     warfarin 7.5 MG tablet  Commonly known as:  COUMADIN  Take 1 tablet (7.5 mg total) by mouth one time only at 6 PM.        Patients skin is clean, dry and intact, no evidence of skin break down. IV site discontinued and catheter remains intact. Site without signs and symptoms of complications. Dressing and pressure applied.  Patient escorted to car by [place], NT in a wheelchair,  no distress noted upon discharge.  [name] 05/08/2014 1:00 PM

## 2014-05-08 NOTE — Progress Notes (Signed)
ANTICOAGULATION CONSULT NOTE  Pharmacy Consult for Lovenox Indication: LV mural thrombus, subtheraputic INR  Allergies  Allergen Reactions  . Adhesive [Tape] Rash    "paper tape is ok" (07/09/2012)  . Bee Venom Anaphylaxis  . Penicillins Rash   Patient Measurements: Height: 5\' 8"  (172.7 cm) Weight: 163 lb 6.4 oz (74.118 kg) IBW/kg (Calculated) : 68.4  Vital Signs: Temp: 98.1 F (36.7 C) (11/05 0756) Temp Source: Oral (11/05 0756) BP: 106/62 mmHg (11/05 0756) Pulse Rate: 62 (11/05 0756)  Labs:  Recent Labs  05/06/14 1757 05/07/14 0518 05/08/14 0506  HGB 12.8* 12.9* 13.4  HCT 37.1* 37.6* 39.4  PLT 291 288 284  LABPROT 14.0  --  14.1  INR 1.07  --  1.08  CREATININE 0.87  --   --    Estimated Creatinine Clearance: 111.4 mL/min (by C-G formula based on Cr of 0.87).  Medical History: Past Medical History  Diagnosis Date  . GERD (gastroesophageal reflux disease)   . CAD (coronary artery disease) 01/22/12    a.anterolateral MI with DES to LAD EF 25% b. reinfarction 8/13 with thrombosis of stent c. s/p CABG x 3: LIMA-LAD, SVG-OM, SVG-RCA (07/2012)   . Hyperlipidemia   . Tobacco abuse   . Chronic systolic CHF (congestive heart failure)     a. (07/2012) EF 20-25% diff HK with reg wall abnl, HK anterolat wall, akinesis inferosep, mod MR;  b. 01/2014 Echo: EF 10-15%, glob HK, mod MR.  . Noncompliance   . Syncope   . LV (left ventricular) mural thrombus     "just found it in Oct" (07/09/2012)  . Ischemic cardiomyopathy     a.  01/2014 Echo: EF 10-15%, glob HK;  b. 03/2014 S/P Subcutaneous ICD.  Marland Kitchen Anginal pain   . Moderate mitral regurgitation   . Myocardial infarction 01/2012; 02/2012; 04/2012   Medications:  Prescriptions prior to admission  Medication Sig Dispense Refill Last Dose  . aspirin EC 81 MG tablet Take 1 tablet (81 mg total) by mouth daily. 30 tablet 6 05/04/2014 at Unknown time  . atorvastatin (LIPITOR) 80 MG tablet Take 80 mg by mouth daily at 6 PM.    05/04/2014 at  Unknown time  . carvedilol (COREG) 6.25 MG tablet Take 1 tablet (6.25 mg total) by mouth 2 (two) times daily with a meal. 60 tablet 6 05/04/2014 at Unknown time  . furosemide (LASIX) 20 MG tablet Take 1 tablet (20 mg total) by mouth daily. 30 tablet 3 05/04/2014 at Unknown time  . lisinopril (ZESTRIL) 5 MG tablet Take 1 tablet (5 mg total) by mouth daily. 30 tablet 6 05/04/2014 at Unknown time  . spironolactone (ALDACTONE) 25 MG tablet Take 1 tablet (25 mg total) by mouth daily. 30 tablet 3 05/04/2014 at Unknown time  . warfarin (COUMADIN) 5 MG tablet Take 5-7.5 mg by mouth daily. Take 5mg  on Monday, Wednesday,  Friday, Saturday and Sunday. Take one and one half tablet on all other days   05/04/2014 at Unknown time  . HYDROcodone-acetaminophen (NORCO/VICODIN) 5-325 MG per tablet Take 1 tablet by mouth every 6 (six) hours as needed for moderate pain. (Patient not taking: Reported on 05/06/2014) 6 tablet 0 Taking    Assessment: 38yo male on chronic Coumadin PTA.  Hand tingling resolved.  LV Mural Thrombus (still present on recent ECHO), Subtherapeutic INR (MD dosing warfarin).  Goal of Therapy:  Anti-Xa level 0.6-1 units/ml 4hrs after LMWH dose given if clinically indicated. Monitor platelets by anticoagulation protocol: Yes   Plan:  Lovenox 70mg  (1mg /Kg) SQ q12hrs Monitor CBC Warfarin Resumed per MD. Labs ordered.  Lamonte RicherHayes, Herlinda Heady R 05/08/2014,9:01 AM

## 2014-05-08 NOTE — Plan of Care (Signed)
Problem: Acute Treatment Outcomes Goal: 02 Sats > 94% Outcome: Completed/Met Date Met:  05/08/14     

## 2014-05-08 NOTE — Plan of Care (Signed)
Problem: Progression Outcomes Goal: Bowel & Bladder Continence Outcome: Completed/Met Date Met:  05/08/14

## 2014-05-12 ENCOUNTER — Ambulatory Visit (INDEPENDENT_AMBULATORY_CARE_PROVIDER_SITE_OTHER): Payer: Medicaid Other | Admitting: *Deleted

## 2014-05-12 ENCOUNTER — Encounter (HOSPITAL_COMMUNITY): Payer: Self-pay

## 2014-05-12 DIAGNOSIS — Z5181 Encounter for therapeutic drug level monitoring: Secondary | ICD-10-CM

## 2014-05-12 DIAGNOSIS — I213 ST elevation (STEMI) myocardial infarction of unspecified site: Secondary | ICD-10-CM | POA: Diagnosis not present

## 2014-05-12 DIAGNOSIS — I513 Intracardiac thrombosis, not elsewhere classified: Secondary | ICD-10-CM

## 2014-05-12 LAB — POCT INR: INR: 1.9
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
## 2014-05-15 ENCOUNTER — Encounter: Payer: Self-pay | Admitting: Adult Health

## 2014-05-15 ENCOUNTER — Ambulatory Visit (INDEPENDENT_AMBULATORY_CARE_PROVIDER_SITE_OTHER): Payer: Medicaid Other | Admitting: Adult Health

## 2014-05-15 VITALS — BP 110/70 | HR 70 | Ht 68.0 in | Wt 160.0 lb

## 2014-05-15 DIAGNOSIS — I2510 Atherosclerotic heart disease of native coronary artery without angina pectoris: Secondary | ICD-10-CM

## 2014-05-15 DIAGNOSIS — I255 Ischemic cardiomyopathy: Secondary | ICD-10-CM

## 2014-05-15 DIAGNOSIS — G459 Transient cerebral ischemic attack, unspecified: Secondary | ICD-10-CM

## 2014-05-15 NOTE — Progress Notes (Deleted)
Name: Logan [name]    DOB: 06-05-1976  Age: 38 y.o.  MR#: [id number]       PCP:  No primary care provider on file.      Insurance: Payor: MEDICAID [place] / Plan: MEDICAID OF [place] / Product Type: *No Product type* /   CC:    Chief Complaint  Patient presents with  . Transient Ischemic Attack  . Coronary Artery Disease  . Congestive Heart Failure    VS Filed Vitals:   05/15/14 1419  BP: 110/70  Pulse: 70  Height: 5\' 8"  (1.727 m)  Weight: 160 lb (72.576 kg)    Weights Current Weight  05/15/14 160 lb (72.576 kg)  05/06/14 163 lb 6.4 oz (74.118 kg)  04/07/14 161 lb 9.6 oz (73.301 kg)    Blood Pressure  BP Readings from Last 3 Encounters:  05/15/14 110/70  05/08/14 106/62  04/07/14 116/71     Admit date:  (Not on file) Last encounter with RMR:  Visit date not found   Allergy Adhesive; Bee venom; and Penicillins  Current Outpatient Prescriptions  Medication Sig Dispense Refill  . aspirin EC 81 MG tablet Take 1 tablet (81 mg total) by mouth daily. 30 tablet 6  . atorvastatin (LIPITOR) 80 MG tablet TAKE ONE TABLET BY MOUTH DAILY AT 6:00 PM 30 tablet 0  . carvedilol (COREG) 6.25 MG tablet Take 1 tablet (6.25 mg total) by mouth 2 (two) times daily with a meal. 60 tablet 6  . furosemide (LASIX) 20 MG tablet Take 1 tablet (20 mg total) by mouth daily. 30 tablet 3  . HYDROcodone-acetaminophen (NORCO/VICODIN) 5-325 MG per tablet Take 1 tablet by mouth every 6 (six) hours as needed for moderate pain. 6 tablet 0  . lisinopril (ZESTRIL) 5 MG tablet Take 1 tablet (5 mg total) by mouth daily. 30 tablet 6  . spironolactone (ALDACTONE) 25 MG tablet Take 1 tablet (25 mg total) by mouth daily. 30 tablet 3  . warfarin (COUMADIN) 5 MG tablet Take 5-7.5 mg by mouth daily. Take 5mg  on Monday, Wednesday,  Friday, Saturday and Sunday. Take one and one half tablet on all other days    . warfarin (COUMADIN) 7.5 MG tablet Take 1 tablet (7.5 mg total) by mouth one time only at 6 PM. 30 tablet 0   No  current facility-administered medications for this visit.    Discontinued Meds:    Medications Discontinued During This Encounter  Medication Reason  . enoxaparin (LOVENOX) 80 MG/0.8ML injection Error    Patient Active Problem List   Diagnosis Date Noted  . Transient ischemic attack 05/06/2014  . 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 Brain TIA   . Other specified forms of chronic ischemic heart disease 03/28/2014  . ETOH abuse 03/28/2014  . Moderate mitral regurgitation   . Encounter for therapeutic drug monitoring 02/17/2014  . Mural thrombus of heart 04/08/2013  . Intermediate coronary syndrome 07/10/2012  . Long term (current) use of anticoagulants 05/08/2012  . Ischemic cardiomyopathy   . LV (left ventricular) mural thrombus   . Syncope   . Noncompliance   . Chronic systolic congestive heart failure 04/06/2012  . CAD (coronary artery disease) 01/25/2012  . Hyperlipidemia 01/25/2012  . Tobacco abuse 01/22/2012    LABS    Component Value Date/Time   NA 139 05/06/2014 1757   NA 137 03/21/2014 1158   NA 141 03/11/2014 1145   K 4.1 05/06/2014 1757   K 4.0 03/21/2014 1158   K  4.5 03/11/2014 1145   CL 105 05/06/2014 1757   CL 107 03/21/2014 1158   CL 103 03/11/2014 1145   CO2 21 05/06/2014 1757   CO2 23 03/21/2014 1158   CO2 24 03/11/2014 1145   GLUCOSE 98 05/06/2014 1757   GLUCOSE 71 03/21/2014 1158   GLUCOSE 81 03/11/2014 1145   BUN 12 05/06/2014 1757   BUN 12 03/21/2014 1158   BUN 12 03/11/2014 1145   CREATININE 0.87 05/06/2014 1757   CREATININE 1.1 03/21/2014 1158   CREATININE 0.90 03/11/2014 1145   CALCIUM 9.3 05/06/2014 1757   CALCIUM 9.0 03/21/2014 1158   CALCIUM 9.2 03/11/2014 1145   GFRNONAA >90 05/06/2014 1757   GFRNONAA >90 03/11/2014 1145   GFRNONAA >90 01/08/2014 1516   GFRAA >90 05/06/2014 1757   GFRAA >90 03/11/2014 1145   GFRAA >90 01/08/2014 1516   CMP     Component Value Date/Time   NA 139 05/06/2014 1757   K 4.1  05/06/2014 1757   CL 105 05/06/2014 1757   CO2 21 05/06/2014 1757   GLUCOSE 98 05/06/2014 1757   BUN 12 05/06/2014 1757   CREATININE 0.87 05/06/2014 1757   CALCIUM 9.3 05/06/2014 1757   PROT 7.0 05/06/2014 1757   ALBUMIN 3.8 05/06/2014 1757   AST 21 05/06/2014 1757   ALT 16 05/06/2014 1757   ALKPHOS 96 05/06/2014 1757   BILITOT 0.2* 05/06/2014 1757   GFRNONAA >90 05/06/2014 1757   GFRAA >90 05/06/2014 1757       Component Value Date/Time   WBC 6.9 05/08/2014 0506   WBC 6.7 05/07/2014 0518   WBC 7.1 05/06/2014 1757   HGB 13.4 05/08/2014 0506   HGB 12.9* 05/07/2014 0518   HGB 12.8* 05/06/2014 1757   HCT 39.4 05/08/2014 0506   HCT 37.6* 05/07/2014 0518   HCT 37.1* 05/06/2014 1757   MCV 100.0 05/08/2014 0506   MCV 99.2 05/07/2014 0518   MCV 97.9 05/06/2014 1757    Lipid Panel     Component Value Date/Time   CHOL 187 05/07/2014 0518   TRIG 107 05/07/2014 0518   HDL 37* 05/07/2014 0518   CHOLHDL 5.1 05/07/2014 0518   VLDL 21 05/07/2014 0518   LDLCALC 129* 05/07/2014 0518    ABG    Component Value Date/Time   PHART 7.335* 07/16/2012 2021   PCO2ART 43.2 07/16/2012 2021   PO2ART 131.0* 07/16/2012 2021   HCO3 23.0 07/16/2012 2021   TCO2 25 07/17/2012 1710   ACIDBASEDEF 3.0* 07/16/2012 2021   O2SAT 99.0 07/16/2012 2021     Lab Results  Component Value Date   TSH 1.212 07/11/2012   BNP (last 3 results) No results for input(s): PROBNP in the last 8760 hours. Cardiac Panel (last 3 results) No results for input(s): CKTOTAL, CKMB, TROPONINI, RELINDX in the last 72 hours.  Iron/TIBC/Ferritin/ %Sat No results found for: IRON, TIBC, FERRITIN, IRONPCTSAT   EKG Orders placed or performed during the hospital encounter of 05/06/14  . EKG 12-Lead  . EKG 12-Lead  . EKG     Prior Assessment and Plan Problem List as of 05/15/2014      Cardiovascular and Mediastinum   CAD (coronary artery disease)   Last Assessment & Plan   06/14/2012 Hospital Encounter Written  06/14/2012  5:09 PM by Sherald Hess, NP    No evidence of ischemia. Continue current regimen. Will need cardiac rehab once Medicaid approved but he has transportation to cardiac rehab.  Chronic systolic congestive heart failure   Last Assessment & Plan   06/19/2012 Office Visit Written 06/19/2012  6:26 PM by Duke Salvia, MD    He continues is class III symptoms. He is followed by the heart failure clinic    LV (left ventricular) mural thrombus   Last Assessment & Plan   05/14/2012 Hospital Encounter Written 05/14/2012 12:58 PM by Sherald Hess, NP    Continue coumadin.    Syncope   Last Assessment & Plan   05/14/2012 Hospital Encounter Edited 05/15/2012  3:21 PM by Dolores Patty, MD    Syncopal episode 2 days ago. Instructed to transmit to Lifevest today for review. Continue Lifevest. Reviewed EKG form 04/22/12 QRS 118. Refer to Dr Graciela Husbands for defibrillator.   Attending: Suspect syncope due to orthostasis. Have interrogated LifeVest and await results. Refer to EP for ICD.     Ischemic cardiomyopathy   Last Assessment & Plan   06/19/2012 Office Visit Written 06/19/2012  6:26 PM by Duke Salvia, MD    The patient has ischemic cardiomyopathy with persistent left ventricular dysfunction despite an interval now of 5 months and medical therapy limited by blood pressure. He has been wearing a LifeVest following an episode of syncope. He is appropriate at this point to proceed with ICD implantation. We have reviewed risks and benefits including but not limited to death perforation infection and lead dislodgment. He understands these risks and is willing to proceed. He is a narrow QRS. There have been issues of flutter and rapid rates detected on his LifeVest and so a dual-chamber device will be implanted.    Intermediate coronary syndrome   Mural thrombus of heart   Other specified forms of chronic ischemic heart disease   Moderate mitral regurgitation   Transient ischemic attack    Brain TIA     Other   Tobacco abuse   Last Assessment & Plan   06/19/2012 Office Visit Written 06/19/2012  6:26 PM by Duke Salvia, MD    We discussed tobacco discontinuation the role of patches of lozenges.    Hyperlipidemia   Last Assessment & Plan   05/10/2012 Office Visit Edited 05/10/2012  4:08 PM by Rande Brunt, PA    Patient to initiate statin therapy with Pravachol 40 daily (which he had not yet filled, citing financial constraints), to be obtained at [facility] for 4 hours/month. Will need followup lipid profile in 12 weeks.    Noncompliance   Long term (current) use of anticoagulants   Encounter for therapeutic drug monitoring   ETOH abuse   S/P CABG (coronary artery bypass graft)       Imaging: Ct Head Wo Contrast  05/06/2014   CLINICAL DATA:  Right hand tingling and slurred speech and blurry vision.  EXAM: CT HEAD WITHOUT CONTRAST  TECHNIQUE: Contiguous axial images were obtained from the base of the skull through the vertex without contrast.  COMPARISON:  None  FINDINGS: No evidence for acute hemorrhage, mass lesion, midline shift, hydrocephalus or large infarct. No acute bone abnormality. Visualized sinuses are clear.  IMPRESSION: Negative head CT.   Electronically Signed   By: Richarda Overlie M.D.   On: 05/06/2014 19:06   US Carotid Bilateral  05/07/2014   CLINICAL DATA:  38 year old male with symptoms of transient ischemic attack.  EXAM: BILATERAL CAROTID DUPLEX ULTRASOUND  TECHNIQUE: Wallace Cullens scale imaging, color Doppler and duplex ultrasound were performed of bilateral carotid and vertebral arteries in the neck.  COMPARISON:  None.  FINDINGS: Criteria: Quantification of carotid stenosis is based on velocity parameters that correlate the residual internal carotid diameter with NASCET-based stenosis levels, using the diameter of the distal internal carotid lumen as the denominator for stenosis measurement.  The following velocity measurements were obtained:  RIGHT  ICA:  72/30  cm/sec  CCA:  93/25 cm/sec  SYSTOLIC ICA/CCA RATIO:  0.8  DIASTOLIC ICA/CCA RATIO:  1.2  ECA:  85 cm/sec  LEFT  ICA:  81/35 cm/sec  CCA:  100/25 cm/sec  SYSTOLIC ICA/CCA RATIO:  0.8  DIASTOLIC ICA/CCA RATIO:  1.4  ECA:  80 cm/sec  RIGHT CAROTID ARTERY: Trace heterogeneous atherosclerotic plaque in the carotid bulb without evidence of stenosis.  RIGHT VERTEBRAL ARTERY:  Patent with normal antegrade flow.  LEFT CAROTID ARTERY: No significant atherosclerotic plaque or evidence of stenosis.  LEFT VERTEBRAL ARTERY:  Patent with normal antegrade flow.  IMPRESSION: 1. Trace heterogeneous atherosclerotic plaque in the right carotid bulb without evidence of stenosis. 2. No significant plaque or evidence of stenosis on the left. 3. Vertebral arteries are patent with normal antegrade flow. Signed,  Sterling BigHeath K. McCullough, MD  [facility]  [facility]   Electronically Signed   By: Malachy MoanHeath  McCullough M.D.   On: 05/07/2014 12:23

## 2014-05-15 NOTE — Patient Instructions (Signed)
Your physician recommends that you schedule a follow-up appointment in: 3 months with Dr. Gala Romney  Your physician recommends that you continue on your current medications as directed. Please refer to the Current Medication list given to you today.  Thank you for choosing [facility]!!

## 2014-05-15 NOTE — Assessment & Plan Note (Signed)
He is followed by Dr.Bensimhon in the Heart Failure office in [place]. He continues to weigh daily and to avoid salty foods. He is now complaint with his medications. He has stopped drinking and is down to 5 cigarettes a day. Continue current medication regimen. No changes. He will continue to see Dr.Benishmohn on follow up in 3 months.

## 2014-05-15 NOTE — Assessment & Plan Note (Signed)
He has no residual weakness. He is taking his coumadin as directed. Followed in our [place] office. Continue current medication regimen.

## 2014-05-15 NOTE — Assessment & Plan Note (Signed)
Currently without cardiac complaints. Continue risk management.

## 2014-05-16 ENCOUNTER — Inpatient Hospital Stay (HOSPITAL_COMMUNITY): Admission: RE | Admit: 2014-05-16 | Payer: Self-pay | Source: Ambulatory Visit

## 2014-05-19 ENCOUNTER — Ambulatory Visit (INDEPENDENT_AMBULATORY_CARE_PROVIDER_SITE_OTHER): Payer: Medicaid Other | Admitting: *Deleted

## 2014-05-19 DIAGNOSIS — I213 ST elevation (STEMI) myocardial infarction of unspecified site: Secondary | ICD-10-CM | POA: Diagnosis not present

## 2014-05-19 DIAGNOSIS — I513 Intracardiac thrombosis, not elsewhere classified: Secondary | ICD-10-CM

## 2014-05-19 DIAGNOSIS — Z5181 Encounter for therapeutic drug level monitoring: Secondary | ICD-10-CM

## 2014-05-19 LAB — POCT INR: INR: 1.2

## 2014-05-26 ENCOUNTER — Ambulatory Visit (INDEPENDENT_AMBULATORY_CARE_PROVIDER_SITE_OTHER): Payer: Medicaid Other | Admitting: *Deleted

## 2014-05-26 DIAGNOSIS — I213 ST elevation (STEMI) myocardial infarction of unspecified site: Secondary | ICD-10-CM

## 2014-05-26 DIAGNOSIS — I513 Intracardiac thrombosis, not elsewhere classified: Secondary | ICD-10-CM

## 2014-05-26 DIAGNOSIS — Z5181 Encounter for therapeutic drug level monitoring: Secondary | ICD-10-CM

## 2014-05-26 LAB — POCT INR: INR: 5.8

## 2014-06-02 ENCOUNTER — Ambulatory Visit (INDEPENDENT_AMBULATORY_CARE_PROVIDER_SITE_OTHER): Payer: Medicaid Other | Admitting: *Deleted

## 2014-06-02 ENCOUNTER — Other Ambulatory Visit (HOSPITAL_COMMUNITY): Payer: Self-pay | Admitting: Anesthesiology

## 2014-06-02 DIAGNOSIS — I213 ST elevation (STEMI) myocardial infarction of unspecified site: Secondary | ICD-10-CM | POA: Diagnosis not present

## 2014-06-02 DIAGNOSIS — I513 Intracardiac thrombosis, not elsewhere classified: Secondary | ICD-10-CM

## 2014-06-02 DIAGNOSIS — Z5181 Encounter for therapeutic drug level monitoring: Secondary | ICD-10-CM | POA: Diagnosis not present

## 2014-06-02 LAB — POCT INR: INR: 1.2

## 2014-06-09 ENCOUNTER — Encounter: Payer: Self-pay | Admitting: *Deleted

## 2014-06-11 ENCOUNTER — Other Ambulatory Visit (HOSPITAL_COMMUNITY): Payer: Self-pay | Admitting: Cardiology

## 2014-06-11 ENCOUNTER — Telehealth: Payer: Self-pay | Admitting: *Deleted

## 2014-06-11 ENCOUNTER — Telehealth (HOSPITAL_COMMUNITY): Payer: Self-pay | Admitting: Vascular Surgery

## 2014-06-11 MED ORDER — WARFARIN SODIUM 5 MG PO TABS: ORAL_TABLET | ORAL | Status: DC

## 2014-06-11 MED ORDER — WARFARIN SODIUM 7.5 MG PO TABS: 7.5000 mg | ORAL_TABLET | Freq: Once | ORAL | Status: DC

## 2014-06-11 NOTE — Addendum Note (Signed)
Addended by: Louanna Raw on: 06/11/2014 02:03 PM   Modules accepted: Orders

## 2014-06-11 NOTE — Telephone Encounter (Signed)
Refill on Warfarin to [facility] in [place]

## 2014-06-12 ENCOUNTER — Encounter (HOSPITAL_COMMUNITY): Payer: Medicaid Other

## 2014-06-12 ENCOUNTER — Encounter (HOSPITAL_COMMUNITY): Payer: Self-pay | Admitting: Interventional Cardiology

## 2014-06-16 ENCOUNTER — Encounter (HOSPITAL_COMMUNITY): Payer: Self-pay

## 2014-06-16 ENCOUNTER — Ambulatory Visit (INDEPENDENT_AMBULATORY_CARE_PROVIDER_SITE_OTHER): Payer: Medicaid Other | Admitting: *Deleted

## 2014-06-16 DIAGNOSIS — Z5181 Encounter for therapeutic drug level monitoring: Secondary | ICD-10-CM

## 2014-06-16 DIAGNOSIS — I513 Intracardiac thrombosis, not elsewhere classified: Secondary | ICD-10-CM

## 2014-06-16 DIAGNOSIS — I213 ST elevation (STEMI) myocardial infarction of unspecified site: Secondary | ICD-10-CM

## 2014-06-16 LAB — POCT INR: INR: 2

## 2014-06-23 ENCOUNTER — Encounter: Payer: Medicaid Other | Admitting: Internal Medicine

## 2014-06-24 ENCOUNTER — Encounter: Payer: Self-pay | Admitting: Internal Medicine

## 2014-06-24 ENCOUNTER — Encounter: Payer: Medicaid Other | Admitting: Internal Medicine

## 2014-06-30 ENCOUNTER — Encounter: Payer: Self-pay | Admitting: *Deleted

## 2014-07-07 ENCOUNTER — Encounter: Payer: Self-pay | Admitting: Internal Medicine

## 2014-07-07 ENCOUNTER — Encounter (HOSPITAL_COMMUNITY): Payer: Self-pay

## 2014-07-08 ENCOUNTER — Encounter (HOSPITAL_COMMUNITY): Payer: Medicaid Other

## 2014-07-09 ENCOUNTER — Other Ambulatory Visit (HOSPITAL_COMMUNITY): Payer: Self-pay | Admitting: Anesthesiology

## 2014-07-14 ENCOUNTER — Other Ambulatory Visit (HOSPITAL_COMMUNITY): Payer: Self-pay | Admitting: *Deleted

## 2014-07-14 DIAGNOSIS — I2510 Atherosclerotic heart disease of native coronary artery without angina pectoris: Secondary | ICD-10-CM

## 2014-07-14 DIAGNOSIS — I2584 Coronary atherosclerosis due to calcified coronary lesion: Principal | ICD-10-CM

## 2014-07-14 MED ORDER — SPIRONOLACTONE 25 MG PO TABS: 25.0000 mg | ORAL_TABLET | Freq: Every day | ORAL | Status: DC

## 2014-08-04 NOTE — Telephone Encounter (Signed)
Open in error

## 2014-08-07 ENCOUNTER — Encounter: Payer: Self-pay | Admitting: *Deleted

## 2014-08-18 ENCOUNTER — Encounter (HOSPITAL_COMMUNITY): Payer: Medicaid Other

## 2014-08-18 ENCOUNTER — Telehealth (HOSPITAL_COMMUNITY): Payer: Self-pay | Admitting: Vascular Surgery

## 2014-08-18 DIAGNOSIS — I2510 Atherosclerotic heart disease of native coronary artery without angina pectoris: Secondary | ICD-10-CM

## 2014-08-18 DIAGNOSIS — I2584 Coronary atherosclerosis due to calcified coronary lesion: Principal | ICD-10-CM

## 2014-08-18 MED ORDER — FUROSEMIDE 20 MG PO TABS: 20.0000 mg | ORAL_TABLET | Freq: Every day | ORAL | Status: DC

## 2014-08-18 NOTE — Telephone Encounter (Signed)
PT need new prescription for Furosemide, he was scheduled to come in today be rescheduled due to the weather

## 2014-08-25 ENCOUNTER — Ambulatory Visit (INDEPENDENT_AMBULATORY_CARE_PROVIDER_SITE_OTHER): Payer: Medicare Other | Admitting: *Deleted

## 2014-08-25 DIAGNOSIS — I213 ST elevation (STEMI) myocardial infarction of unspecified site: Secondary | ICD-10-CM | POA: Diagnosis not present

## 2014-08-25 DIAGNOSIS — Z5181 Encounter for therapeutic drug level monitoring: Secondary | ICD-10-CM | POA: Diagnosis not present

## 2014-08-25 DIAGNOSIS — I513 Intracardiac thrombosis, not elsewhere classified: Secondary | ICD-10-CM

## 2014-08-25 LAB — POCT INR: INR: 4

## 2014-09-03 ENCOUNTER — Ambulatory Visit (HOSPITAL_COMMUNITY)
Admission: RE | Admit: 2014-09-03 | Discharge: 2014-09-03 | Disposition: A | Discharge status: Home / Self Care | Payer: Medicare Other | Source: Ambulatory Visit | Attending: Cardiology | Admitting: Cardiology

## 2014-09-03 ENCOUNTER — Encounter (HOSPITAL_COMMUNITY): Payer: Self-pay

## 2014-09-03 VITALS — BP 114/58 | HR 64 | Wt 166.5 lb

## 2014-09-03 DIAGNOSIS — I213 ST elevation (STEMI) myocardial infarction of unspecified site: Secondary | ICD-10-CM

## 2014-09-03 DIAGNOSIS — Z7901 Long term (current) use of anticoagulants: Secondary | ICD-10-CM | POA: Insufficient documentation

## 2014-09-03 DIAGNOSIS — I252 Old myocardial infarction: Secondary | ICD-10-CM | POA: Insufficient documentation

## 2014-09-03 DIAGNOSIS — E785 Hyperlipidemia, unspecified: Secondary | ICD-10-CM | POA: Diagnosis not present

## 2014-09-03 DIAGNOSIS — K219 Gastro-esophageal reflux disease without esophagitis: Secondary | ICD-10-CM | POA: Diagnosis not present

## 2014-09-03 DIAGNOSIS — Z9119 Patient's noncompliance with other medical treatment and regimen: Secondary | ICD-10-CM | POA: Diagnosis not present

## 2014-09-03 DIAGNOSIS — G4733 Obstructive sleep apnea (adult) (pediatric): Secondary | ICD-10-CM | POA: Diagnosis not present

## 2014-09-03 DIAGNOSIS — F17210 Nicotine dependence, cigarettes, uncomplicated: Secondary | ICD-10-CM | POA: Insufficient documentation

## 2014-09-03 DIAGNOSIS — Z720 Tobacco use: Secondary | ICD-10-CM

## 2014-09-03 DIAGNOSIS — F1010 Alcohol abuse, uncomplicated: Secondary | ICD-10-CM | POA: Diagnosis not present

## 2014-09-03 DIAGNOSIS — I255 Ischemic cardiomyopathy: Secondary | ICD-10-CM | POA: Insufficient documentation

## 2014-09-03 DIAGNOSIS — I5022 Chronic systolic (congestive) heart failure: Secondary | ICD-10-CM | POA: Insufficient documentation

## 2014-09-03 DIAGNOSIS — G459 Transient cerebral ischemic attack, unspecified: Secondary | ICD-10-CM

## 2014-09-03 DIAGNOSIS — I2510 Atherosclerotic heart disease of native coronary artery without angina pectoris: Secondary | ICD-10-CM | POA: Diagnosis not present

## 2014-09-03 DIAGNOSIS — Z79899 Other long term (current) drug therapy: Secondary | ICD-10-CM | POA: Diagnosis not present

## 2014-09-03 DIAGNOSIS — Z7982 Long term (current) use of aspirin: Secondary | ICD-10-CM | POA: Insufficient documentation

## 2014-09-03 DIAGNOSIS — Z8673 Personal history of transient ischemic attack (TIA), and cerebral infarction without residual deficits: Secondary | ICD-10-CM | POA: Diagnosis not present

## 2014-09-03 DIAGNOSIS — Z951 Presence of aortocoronary bypass graft: Secondary | ICD-10-CM | POA: Insufficient documentation

## 2014-09-03 DIAGNOSIS — I513 Intracardiac thrombosis, not elsewhere classified: Secondary | ICD-10-CM

## 2014-09-03 LAB — BASIC METABOLIC PANEL
ANION GAP: 4 — AB (ref 5–15)
BUN: 14 mg/dL (ref 6–23)
CO2: 30 mmol/L (ref 19–32)
Calcium: 9.6 mg/dL (ref 8.4–10.5)
Chloride: 104 mmol/L (ref 96–112)
Creatinine, Ser: 0.9 mg/dL (ref 0.50–1.35)
Glucose, Bld: 80 mg/dL (ref 70–99)
POTASSIUM: 4.1 mmol/L (ref 3.5–5.1)
Sodium: 138 mmol/L (ref 135–145)

## 2014-09-03 NOTE — Progress Notes (Signed)
Patient ID: Logan [name], male   DOB: Sep 28, 1975, 39 y.o.   MRN: [id number] PCP: None   HPI:  Mr. Rhue is a 39 year old male with CAD s/p CABG x 3; LIMA-LAD, SVG -obtuse marginal, SVG-RCA (07/2012), ischemic cardiomyopathy- Hx of anterolateral MI with DES to LAD; reinfarction 02/05/12 while in [place], [place] with thrombosis of the stent and repeat stenting. He also has a history of nicotine dependence, previous ETOH abuse, TIA 05/2014, and non-compliance.  Follow up: Overall feeling ok. SOB with inclined surfaces. Denies CP/orthopnea/PND. Weight at home 165-168 pounds.   He has cut back on ETOH . Drinking to 12 pack of beer every 2 weeks. Smoking 1/2 PPD. Taking all medications. Not working. No bleed problems. On disability.   CPX Results 06/2012 Peak VO2: 21.9 % predicted peak VO2: 52.9% VE/VCO2 slope: 33.7 OUES: 1.53 Peak RER: 1.31 Ventilatory Threshold: 17.4 % predicted peak VO2: 42%   L/RHC 07/2012 (Followed by CABG) RA = 4  RV = 35/2/6  PA = 30/14 (20)  PCW = 15 v = 25  Fick cardiac output/index = 3.7/2.1  PVR = 1.4 [place]  FA sat = 97%  PA sat = 62%, 65%  Assessment:  1. 3 vessel disease with totally occluded RCA, severe disease in LCx, and 80% in-stent restenosis in LAD.  2. Low cardiac output, elevated filling pressures.   ECHO 04/2013: EF 20% with mural thrombus, grade I diastolic dysfunction, moderate RV dilation, moderate MR.   Limited ECHO (7/15): moderate LV dilation, EF 10-15%, restrictive diastolic function, there was an apical thrombus noted, moderate functional MR, moderately decreased RV systolic function.   Labs (7/15): K 4.4, creatinine 0.87, HCT 42.6 Labs (8/15): LDL 158, HDL 56 Labs 05/2014: K 4.1 Creatinine 0.87   SH: Married, lives in [place], 2 kids, on disability, smokes 1 ppd, +ETOH  FH: Brother with MI at 23, grandmother MI at 21, mother SCD  ROS: All systems negative except as listed in HPI, PMH and Problem List.  Past Medical History   Diagnosis Date  . GERD (gastroesophageal reflux disease)   . CAD (coronary artery disease) 01/22/12    a.anterolateral MI with DES to LAD EF 25% b. reinfarction 8/13 with thrombosis of stent c. s/p CABG x 3: LIMA-LAD, SVG-OM, SVG-RCA (07/2012)   . Hyperlipidemia   . Tobacco abuse   . Chronic systolic CHF (congestive heart failure)     a. (07/2012) EF 20-25% diff HK with reg wall abnl, HK anterolat wall, akinesis inferosep, mod MR;  b. 01/2014 Echo: EF 10-15%, glob HK, mod MR.  . Noncompliance   . Syncope   . LV (left ventricular) mural thrombus     "just found it in Oct" (07/09/2012)  . Ischemic cardiomyopathy     a.  01/2014 Echo: EF 10-15%, glob HK;  b. 03/2014 S/P Subcutaneous ICD.  Marland Kitchen Anginal pain   . Moderate mitral regurgitation   . Myocardial infarction 01/2012; 02/2012; 04/2012  . Stroke 04/2014    TIA in the setting of medical non-compliance with coumadin requiring hospitalization.    Current Outpatient Prescriptions  Medication Sig Dispense Refill  . aspirin EC 81 MG tablet Take 1 tablet (81 mg total) by mouth daily. 30 tablet 6  . atorvastatin (LIPITOR) 80 MG tablet TAKE ONE TABLET BY MOUTH ONCE DAILY AT  6:00  PM 30 tablet 3  . carvedilol (COREG) 6.25 MG tablet Take 1 tablet (6.25 mg total) by mouth 2 (two) times daily with a meal. 60 tablet 6  .  furosemide (LASIX) 20 MG tablet Take 1 tablet (20 mg total) by mouth daily. 30 tablet 3  . lisinopril (ZESTRIL) 5 MG tablet Take 1 tablet (5 mg total) by mouth daily. 30 tablet 6  . spironolactone (ALDACTONE) 25 MG tablet Take 1 tablet (25 mg total) by mouth daily. 30 tablet 2  . warfarin (COUMADIN) 5 MG tablet Take  on Mondays, Wednesdays and Fridays and 7.5mg  all other days or as directed 45 tablet 3  . warfarin (COUMADIN) 7.5 MG tablet Take 1 tablet (7.5 mg total) by mouth one time only at 6 PM. 30 tablet 0   No current facility-administered medications for this encounter.    Filed Vitals:   09/03/14 1119  BP: 114/58  Pulse: 64   Weight: 166 lb 8 oz (75.524 kg)  SpO2: 98%    PHYSICAL EXAM: General:   No respiratory difficulty; GF and Mom present HEENT: normal Neck: supple. JVP 5-6cm. Carotids 2+ bilat; no bruits. No lymphadenopathy or thryomegaly appreciated. Cor: PMI nondisplaced. Regular rate & rhythm. No rubs, gallops or murmurs. Midline incision approximated and healed Lungs: prolonged expiratory phase.  Abdomen: soft, nontender, nondistended. No hepatosplenomegaly. No bruits or masses. Good bowel sounds. Extremities: no cyanosis, clubbing, rash, no edema Neuro: alert & oriented x 3, cranial nerves grossly intact. moves all 4 extremities w/o difficulty. Affect pleasant.  ASSESSMENT & PLAN:  1) Chronic systolic HF: Echo (11/15) with EF 10-15%. Ischemic cardiomyopathy, not much improvement in LV function post-CABG.  Has Subcutaneous [employer] ICD. NYHA class II symptoms. He looks euvolemic.  Continue Lasix 20 mg daily.  - Continue current Coreg 6.25 mg twice a day and lisinopril 5 mg daily  - Continue spironolactone to 25 mg daily  - Reinforced medication compliance, low salt food choices, limiting fluid intake to < 2 liters per day.  Will likely need advanced therapies down the road. He understands he needs to stop smoking and drinking alcohol.  Check CPX later this year.  2) CAD: s/p CABG x 3 in 1/14. No chest pain.  He is now on atorvastatin and ASA 81.  With LV thrombus on last echo, on coumadin. No evidence of ischeia. No bleeding problems.   3) Mural Thrombus: Noted again on most recent echo.  On coumadin.  4) OSA:  Sleep study not suggestive of OSA.  5) Hyperlipidemia: On atorvastatin 80 mg daily. Check lipids next visit.  6) ETOH abuse: Has cut back to 6 beers a week. Needs to stop all together. He declines.  7) Current Smoker: Smoking 1/2 PPD. Encouraged to stop all together.   8) TIA- 05/2014   Follow up 6 months.    CLEGG,AMY NP-C  09/03/2014

## 2014-09-03 NOTE — Patient Instructions (Signed)
Lab today  We will contact you in 6 months to schedule your next appointment.  

## 2014-09-15 ENCOUNTER — Ambulatory Visit (INDEPENDENT_AMBULATORY_CARE_PROVIDER_SITE_OTHER): Payer: Medicare Other | Admitting: *Deleted

## 2014-09-15 DIAGNOSIS — Z5181 Encounter for therapeutic drug level monitoring: Secondary | ICD-10-CM | POA: Diagnosis not present

## 2014-09-15 DIAGNOSIS — I513 Intracardiac thrombosis, not elsewhere classified: Secondary | ICD-10-CM

## 2014-09-15 DIAGNOSIS — I213 ST elevation (STEMI) myocardial infarction of unspecified site: Secondary | ICD-10-CM

## 2014-09-15 LAB — POCT INR: INR: 1.6

## 2014-09-20 ENCOUNTER — Emergency Department (HOSPITAL_COMMUNITY)
Admission: EM | Admit: 2014-09-20 | Discharge: 2014-09-20 | Disposition: A | Discharge status: Home / Self Care | Payer: Medicare Other | Attending: Emergency Medicine | Admitting: Emergency Medicine

## 2014-09-20 ENCOUNTER — Encounter (HOSPITAL_COMMUNITY): Payer: Self-pay | Admitting: Emergency Medicine

## 2014-09-20 ENCOUNTER — Emergency Department (HOSPITAL_COMMUNITY): Payer: Medicare Other

## 2014-09-20 DIAGNOSIS — R05 Cough: Secondary | ICD-10-CM | POA: Insufficient documentation

## 2014-09-20 DIAGNOSIS — R509 Fever, unspecified: Secondary | ICD-10-CM

## 2014-09-20 DIAGNOSIS — Z880 Allergy status to penicillin: Secondary | ICD-10-CM | POA: Insufficient documentation

## 2014-09-20 DIAGNOSIS — Z7982 Long term (current) use of aspirin: Secondary | ICD-10-CM | POA: Diagnosis not present

## 2014-09-20 DIAGNOSIS — R61 Generalized hyperhidrosis: Secondary | ICD-10-CM | POA: Insufficient documentation

## 2014-09-20 DIAGNOSIS — Z9861 Coronary angioplasty status: Secondary | ICD-10-CM | POA: Insufficient documentation

## 2014-09-20 DIAGNOSIS — Z720 Tobacco use: Secondary | ICD-10-CM | POA: Insufficient documentation

## 2014-09-20 DIAGNOSIS — Z8719 Personal history of other diseases of the digestive system: Secondary | ICD-10-CM | POA: Diagnosis not present

## 2014-09-20 DIAGNOSIS — Z951 Presence of aortocoronary bypass graft: Secondary | ICD-10-CM | POA: Insufficient documentation

## 2014-09-20 DIAGNOSIS — I252 Old myocardial infarction: Secondary | ICD-10-CM | POA: Insufficient documentation

## 2014-09-20 DIAGNOSIS — R059 Cough, unspecified: Secondary | ICD-10-CM

## 2014-09-20 DIAGNOSIS — Z79899 Other long term (current) drug therapy: Secondary | ICD-10-CM | POA: Diagnosis not present

## 2014-09-20 DIAGNOSIS — I2510 Atherosclerotic heart disease of native coronary artery without angina pectoris: Secondary | ICD-10-CM | POA: Insufficient documentation

## 2014-09-20 DIAGNOSIS — Z95810 Presence of automatic (implantable) cardiac defibrillator: Secondary | ICD-10-CM | POA: Insufficient documentation

## 2014-09-20 DIAGNOSIS — E785 Hyperlipidemia, unspecified: Secondary | ICD-10-CM | POA: Diagnosis not present

## 2014-09-20 DIAGNOSIS — I5022 Chronic systolic (congestive) heart failure: Secondary | ICD-10-CM | POA: Diagnosis not present

## 2014-09-20 DIAGNOSIS — Z9889 Other specified postprocedural states: Secondary | ICD-10-CM | POA: Diagnosis not present

## 2014-09-20 DIAGNOSIS — Z7901 Long term (current) use of anticoagulants: Secondary | ICD-10-CM | POA: Insufficient documentation

## 2014-09-20 DIAGNOSIS — Z8673 Personal history of transient ischemic attack (TIA), and cerebral infarction without residual deficits: Secondary | ICD-10-CM | POA: Diagnosis not present

## 2014-09-20 MED ORDER — OSELTAMIVIR PHOSPHATE 75 MG PO CAPS: 75.0000 mg | ORAL_CAPSULE | Freq: Two times a day (BID) | ORAL | Status: DC

## 2014-09-20 MED ORDER — HYDROCODONE-HOMATROPINE 5-1.5 MG/5ML PO SYRP: 5.0000 mL | ORAL_SOLUTION | Freq: Four times a day (QID) | ORAL | Status: DC | PRN

## 2014-09-20 MED ORDER — ACETAMINOPHEN 500 MG PO TABS
1000.0000 mg | ORAL_TABLET | Freq: Once | ORAL | Status: AC
  Administered 2014-09-20: 1000 mg via ORAL
  Filled 2014-09-20: qty 2

## 2014-09-20 MED ORDER — HYDROCODONE-HOMATROPINE 5-1.5 MG/5ML PO SYRP: 5.0000 mL | ORAL_SOLUTION | ORAL | Status: DC | PRN

## 2014-09-20 MED ORDER — OSELTAMIVIR PHOSPHATE 75 MG PO CAPS
75.0000 mg | ORAL_CAPSULE | Freq: Two times a day (BID) | ORAL | Status: DC
  Administered 2014-09-20: 75 mg via ORAL
  Filled 2014-09-20: qty 1

## 2014-09-20 NOTE — Discharge Instructions (Signed)
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
## 2014-09-20 NOTE — ED Notes (Signed)
Patient complaining of fever, body aches, cough, and chills that started today.

## 2014-09-20 NOTE — ED Notes (Signed)
Patient's significant other came to nurse first and voiced concerns stating that patient was sweaty and seemed very tired. Pulled patient back into triage room and assessed vital signs. Patient alert and oriented. No acute distress noted. Visible sweat noted to forehead. Oral temperature down to 100.7. Patient denies any new complaints at this time, complaining of only generalized body aches. Patient's significant other concerned due to patient's cardiac history. EKG being performed at this time.

## 2014-09-22 ENCOUNTER — Ambulatory Visit (INDEPENDENT_AMBULATORY_CARE_PROVIDER_SITE_OTHER): Payer: Medicare Other | Admitting: Internal Medicine

## 2014-09-22 ENCOUNTER — Encounter: Payer: Self-pay | Admitting: Internal Medicine

## 2014-09-22 ENCOUNTER — Telehealth: Payer: Self-pay | Admitting: Internal Medicine

## 2014-09-22 VITALS — BP 110/76 | HR 73 | Ht 68.0 in | Wt 164.8 lb

## 2014-09-22 DIAGNOSIS — I255 Ischemic cardiomyopathy: Secondary | ICD-10-CM | POA: Diagnosis not present

## 2014-09-22 DIAGNOSIS — I5022 Chronic systolic (congestive) heart failure: Secondary | ICD-10-CM

## 2014-09-22 LAB — MDC_IDC_ENUM_SESS_TYPE_INCLINIC: Implantable Pulse Generator Serial Number: 105963

## 2014-09-22 NOTE — Patient Instructions (Signed)
Your physician recommends that you continue on your current medications as directed. Please refer to the Current Medication list given to you today.  Your physician wants you to follow-up in: 9 months with Dr. Graciela Husbands. You will receive a reminder letter in the mail two months in advance. If you don't receive a letter, please call our office to schedule the follow-up appointment.  Remote monitoring is used to monitor your Pacemaker or ICD from home. This monitoring reduces the number of office visits required to check your device to one time per year. It allows [place] to keep an eye on the functioning of your device to ensure it is working properly. You are scheduled for a device check from home on 12/22/2014. You may send your transmission at any time that day. If you have a wireless device, the transmission will be sent automatically. After your physician reviews your transmission, you will receive a postcard with your next transmission date.

## 2014-09-22 NOTE — Telephone Encounter (Signed)
Error
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
## 2014-10-18 ENCOUNTER — Other Ambulatory Visit: Payer: Self-pay | Admitting: Adult Health

## 2014-10-20 ENCOUNTER — Other Ambulatory Visit (HOSPITAL_COMMUNITY): Payer: Self-pay | Admitting: *Deleted

## 2014-10-20 DIAGNOSIS — I5022 Chronic systolic (congestive) heart failure: Secondary | ICD-10-CM

## 2014-10-20 MED ORDER — LISINOPRIL 5 MG PO TABS: 5.0000 mg | ORAL_TABLET | Freq: Every day | ORAL | Status: DC

## 2014-12-17 ENCOUNTER — Telehealth: Payer: Self-pay | Admitting: Cardiology

## 2014-12-17 NOTE — Telephone Encounter (Signed)
Pt called in and stated that he doesn't have a land line phone anymore. I informed pt that I can order cell adapter but it will be a couple weeks before he will receive it. Pt will call back later to schedule an appt w/ device clinic to have ICD checked.

## 2015-01-01 ENCOUNTER — Ambulatory Visit (INDEPENDENT_AMBULATORY_CARE_PROVIDER_SITE_OTHER): Payer: Medicare Other | Admitting: *Deleted

## 2015-01-01 DIAGNOSIS — Z5181 Encounter for therapeutic drug level monitoring: Secondary | ICD-10-CM | POA: Diagnosis not present

## 2015-01-01 DIAGNOSIS — I513 Intracardiac thrombosis, not elsewhere classified: Secondary | ICD-10-CM

## 2015-01-01 DIAGNOSIS — I213 ST elevation (STEMI) myocardial infarction of unspecified site: Secondary | ICD-10-CM | POA: Diagnosis not present

## 2015-01-01 LAB — POCT INR: INR: 2.3

## 2015-01-01 MED ORDER — WARFARIN SODIUM 5 MG PO TABS: 5.0000 mg | ORAL_TABLET | ORAL | Status: DC

## 2015-02-12 ENCOUNTER — Ambulatory Visit (INDEPENDENT_AMBULATORY_CARE_PROVIDER_SITE_OTHER): Payer: Medicare Other | Admitting: *Deleted

## 2015-02-12 ENCOUNTER — Ambulatory Visit (HOSPITAL_COMMUNITY)
Admission: RE | Admit: 2015-02-12 | Discharge: 2015-02-12 | Disposition: A | Discharge status: Home / Self Care | Payer: Medicare Other | Source: Ambulatory Visit | Attending: Internal Medicine | Admitting: Internal Medicine

## 2015-02-12 ENCOUNTER — Encounter (HOSPITAL_COMMUNITY): Payer: Self-pay

## 2015-02-12 VITALS — BP 104/59 | HR 66 | Resp 18 | Wt 167.5 lb

## 2015-02-12 DIAGNOSIS — I5022 Chronic systolic (congestive) heart failure: Secondary | ICD-10-CM

## 2015-02-12 DIAGNOSIS — I2510 Atherosclerotic heart disease of native coronary artery without angina pectoris: Secondary | ICD-10-CM

## 2015-02-12 DIAGNOSIS — Z5181 Encounter for therapeutic drug level monitoring: Secondary | ICD-10-CM | POA: Diagnosis not present

## 2015-02-12 DIAGNOSIS — I513 Intracardiac thrombosis, not elsewhere classified: Secondary | ICD-10-CM

## 2015-02-12 DIAGNOSIS — G459 Transient cerebral ischemic attack, unspecified: Secondary | ICD-10-CM

## 2015-02-12 DIAGNOSIS — I2584 Coronary atherosclerosis due to calcified coronary lesion: Secondary | ICD-10-CM

## 2015-02-12 DIAGNOSIS — I213 ST elevation (STEMI) myocardial infarction of unspecified site: Secondary | ICD-10-CM | POA: Diagnosis not present

## 2015-02-12 LAB — COMPREHENSIVE METABOLIC PANEL
ALK PHOS: 81 U/L (ref 38–126)
ALT: 17 U/L (ref 17–63)
ANION GAP: 8 (ref 5–15)
AST: 22 U/L (ref 15–41)
Albumin: 3.9 g/dL (ref 3.5–5.0)
BUN: 9 mg/dL (ref 6–20)
CHLORIDE: 109 mmol/L (ref 101–111)
CO2: 23 mmol/L (ref 22–32)
Calcium: 9.6 mg/dL (ref 8.9–10.3)
Creatinine, Ser: 0.8 mg/dL (ref 0.61–1.24)
GFR calc Af Amer: >60 mL/min (ref >60)
GFR calc non Af Amer: >60 mL/min (ref >60)
Glucose, Bld: 80 mg/dL (ref 65–99)
POTASSIUM: 4.2 mmol/L (ref 3.5–5.1)
Sodium: 140 mmol/L (ref 135–145)
Total Bilirubin: 0.9 mg/dL (ref 0.3–1.2)
Total Protein: 6.7 g/dL (ref 6.5–8.1)

## 2015-02-12 LAB — LIPID PANEL
Cholesterol: 243 mg/dL — ABNORMAL HIGH (ref 0–200)
HDL: 34 mg/dL — ABNORMAL LOW (ref >40)
LDL CALC: 173 mg/dL — AB (ref 0–99)
TRIGLYCERIDES: 181 mg/dL — AB (ref <150)
Total CHOL/HDL Ratio: 7.1 RATIO
VLDL: 36 mg/dL (ref 0–40)

## 2015-02-12 LAB — POCT INR: INR: 1

## 2015-02-12 MED ORDER — LISINOPRIL 5 MG PO TABS: 5.0000 mg | ORAL_TABLET | Freq: Every day | ORAL | Status: DC

## 2015-02-12 MED ORDER — RIVAROXABAN 20 MG PO TABS
20.0000 mg | ORAL_TABLET | Freq: Every day | ORAL | Status: DC
Start: 2015-02-12 — End: 2017-03-14

## 2015-02-12 MED ORDER — FUROSEMIDE 20 MG PO TABS: 20.0000 mg | ORAL_TABLET | Freq: Every day | ORAL | Status: DC

## 2015-02-12 MED ORDER — CARVEDILOL 6.25 MG PO TABS: 6.2500 mg | ORAL_TABLET | Freq: Two times a day (BID) | ORAL | Status: DC

## 2015-02-12 NOTE — Progress Notes (Signed)
Advanced Heart Failure Medication Review by a Pharmacist  Does the patient  feel that his/her medications are working for him/her?  yes  Has the patient been experiencing any side effects to the medications prescribed?  no  Does the patient measure his/her own blood pressure or blood glucose at home?  no   Does the patient have any problems obtaining medications due to transportation or finances?   yes  Understanding of regimen: fair Understanding of indications: fair Potential of compliance: poor    Pharmacist comments: Logan [name] is a 39 yo M presenting with his wife and 3 children without a medication list. He seems to understand the indication for each of his medications but reports poor compliance due to running out of his medications and not going to the pharmacy to pick up his refills. His wife did state that at times it is financially difficult for them to afford all of his medications as well as hers. He currently has Medicaid/Medicare insurances and his copays are generally $1.20 per 30 day supply. This was verified with his [facility] pharmacist along with last fill dates which were on October 16, 2014 except for his spironolactone which was filled on August 15, 2014. I reiterated the importance of consistent use of his medications and offered for them to speak with our social worker, Logan [name], about further financial assistance. Patient and wife also asked about transitioning his anticoagulant therapy from warfarin to lovenox due to difficulty in maintaining a therapeutic INR which could be due to his admitted non-compliance. His wife states that when lovenox was used in the past, she would give him his shots regularly and rarely missed any doses. All medication discrepancies were resolved and questions answered.   Logan [name]. Logan [name], PharmD, BCPS, CPP Clinical Pharmacist Pager: [phone] Phone: [phone] 02/12/2015 12:41 PM

## 2015-02-12 NOTE — Progress Notes (Signed)
Patient ID: Logan [name], male   DOB: [birth year], 39 y.o.   MRN: [id number] PCP: None   HPI:  Logan [name] is a 39 year old male with CAD s/p CABG x 3; LIMA-LAD, SVG -obtuse marginal, SVG-RCA (07/2012), ischemic cardiomyopathy- Hx of anterolateral MI with DES to LAD; reinfarction 02/05/12 while in [place], [place] with thrombosis of the stent and repeat stenting. He also has a history of nicotine dependence, previous ETOH abuse, TIA 05/2014, PAF and non-compliance.  He presents today for HF follow up. Overall feeling ok. Can do 1-2 flight of stairs before getting SOB. Denies CP or PND. Has occasional Orthopnea. Weight up 2 lbs from last visit. Weight at home 159-167 pounds.  Has one or two beers a month. Smoking 1/3 - 1/2 PPD. Has been off lasix, lisinopril, coumadin and coreg for 2-3 weeks, Not working. No bleeding problems. On disability. Has occasional blurry vision that comes and goes.  CPX Results 06/2012 Peak VO2: 21.9 % predicted peak VO2: 52.9% VE/VCO2 slope: 33.7 OUES: 1.53 Peak RER: 1.31 Ventilatory Threshold: 17.4 % predicted peak VO2: 42%   L/RHC 07/2012 (Followed by CABG) RA = 4  RV = 35/2/6  PA = 30/14 (20)  PCW = 15 v = 25  Fick cardiac output/index = 3.7/2.1  PVR = 1.4 [place]  FA sat = 97%  PA sat = 62%, 65%   Assessment:  1. 3 vessel disease with totally occluded RCA, severe disease in LCx, and 80% in-stent restenosis in LAD.  2. Low cardiac output, elevated filling pressures.   ECHO 04/2013: EF 20% with mural thrombus, grade I diastolic dysfunction, moderate RV dilation, moderate MR.   Limited ECHO (7/15): moderate LV dilation, EF 10-15%, restrictive diastolic function, there was an apical thrombus noted, moderate functional MR, moderately decreased RV systolic function.   Labs (7/15): K 4.4, creatinine 0.87, HCT 42.6 Labs (8/15): LDL 158, HDL 56 Labs 05/2014: K 4.1 Creatinine 0.87   SH: Married, lives in [place], 2 kids, on disability, smokes 1 ppd, +ETOH  FH:  Brother with MI at 95, grandmother MI at 50, mother SCD  ROS: All systems negative except as listed in HPI, PMH and Problem List.  Past Medical History  Diagnosis Date  . GERD (gastroesophageal reflux disease)   . CAD (coronary artery disease) 01/22/12    a.anterolateral MI with DES to LAD EF 25% b. reinfarction 8/13 with thrombosis of stent c. s/p CABG x 3: LIMA-LAD, SVG-OM, SVG-RCA (07/2012)   . Hyperlipidemia   . Tobacco abuse   . Chronic systolic CHF (congestive heart failure)     a. (07/2012) EF 20-25% diff HK with reg wall abnl, HK anterolat wall, akinesis inferosep, mod MR;  b. 01/2014 Echo: EF 10-15%, glob HK, mod MR.  . Noncompliance   . Syncope   . LV (left ventricular) mural thrombus     "just found it in Oct" (07/09/2012)  . Ischemic cardiomyopathy     a.  01/2014 Echo: EF 10-15%, glob HK;  b. 03/2014 S/P Subcutaneous ICD.  Marland Kitchen Anginal pain   . Moderate mitral regurgitation   . Myocardial infarction 01/2012; 02/2012; 04/2012  . Stroke 04/2014    TIA in the setting of medical non-compliance with coumadin requiring hospitalization.    Current Outpatient Prescriptions  Medication Sig Dispense Refill  . aspirin EC 81 MG tablet Take 1 tablet (81 mg total) by mouth daily. 30 tablet 6  . atorvastatin (LIPITOR) 80 MG tablet TAKE ONE TABLET BY MOUTH ONCE DAILY AT  6:00  PM 30 tablet 3  . carvedilol (COREG) 6.25 MG tablet Take 1 tablet (6.25 mg total) by mouth 2 (two) times daily with a meal. 60 tablet 6  . furosemide (LASIX) 20 MG tablet Take 1 tablet (20 mg total) by mouth daily. 30 tablet 3  . HYDROcodone-homatropine (HYCODAN) 5-1.5 MG/5ML syrup Take 5 mLs by mouth every 6 (six) hours as needed for cough. 75 mL 0  . lisinopril (PRINIVIL,ZESTRIL) 5 MG tablet Take 1 tablet (5 mg total) by mouth daily. 30 tablet 6  . oseltamivir (TAMIFLU) 75 MG capsule Take 1 capsule (75 mg total) by mouth every 12 (twelve) hours. 10 capsule 0  . spironolactone (ALDACTONE) 25 MG tablet Take 1 tablet (25 mg  total) by mouth daily. 30 tablet 2  . warfarin (COUMADIN) 5 MG tablet Take 1 tablet (5 mg total) by mouth as directed. 60 tablet 0   No current facility-administered medications for this encounter.    Filed Vitals:   02/12/15 1149  BP: 104/59  Pulse: 66  Resp: 18  Weight: 167 lb 8 oz (75.978 kg)  SpO2: 100%    PHYSICAL EXAM: General:   No respiratory difficulty; GF and Mom present HEENT: normal Neck: supple. JVP 5-6 cm. Carotids 2+ bilat; no bruits. No lymphadenopathy or thryomegaly appreciated. Cor: PMI nondisplaced. Regular rate & rhythm. No rubs, gallops or murmurs. Midline incision approximated and healed Lungs: prolonged expiratory phase.  Abdomen: soft, nontender, nondistended. No hepatosplenomegaly. No bruits or masses. Good bowel sounds. Extremities: no cyanosis, clubbing, rash, no edema Neuro: alert & oriented x 3, cranial nerves grossly intact. moves all 4 extremities w/o difficulty. Affect pleasant.  ASSESSMENT & PLAN:  1) Chronic systolic HF: Echo (11/15) with EF 10-15%. Ischemic cardiomyopathy, not much improvement in LV function post-CABG.  Has Subcutaneous [employer] ICD. NYHA class II symptoms. He looks euvolemic despite noncompliance with meds.   - Resume carvedilol, lasix and lisinopril. Stressed need for compliance.  - Continue spironolactone to 25 mg daily  - Reinforced medication compliance, low salt food choices, limiting fluid intake to < 2 liters per day.  - Currently not candidate for advanced therapies due to substance abuse. He understands he needs to stop smoking and drinking alcohol.  Will likely need CPX within next year. 2) CAD: s/p CABG x 3 in 1/14. No chest pain.  He is now on atorvastatin and ASA 81.  With LV thrombus on last echo, on coumadin. No evidence of ischemia. No bleeding problems.   3) Mural Thrombus: Noted again on most recent echo.  Not able to handle coumadin. Will switch to Xarelto for thrombus and PAF.  4) OSA:  Sleep study not  suggestive of OSA.  5) Hyperlipidemia: On atorvastatin 80 mg daily. Check lipids. 6) ETOH abuse: Has cut back to 1-2 beers a month.  7) Current Smoker: Smoking 1/3 - 1/2 PPD. Encouraged to stop all together.   8) TIA- 05/2014. Continues with occasional blurry spells. Check carotis u/s.  9) PAF - This patients CHA2DS2-VASc Score and unadjusted Ischemic Stroke Rate (% per year) is equal to 4.8 % stroke rate/year from a score of 4  Unable to manage coumadin. Switch to Xarelt   Check BMET and lipids.     Graciella Freer PA-C 02/12/2015   Patient seen and examined with Otilio Saber, PA-C. We discussed all aspects of the encounter. I agree with the assessment and plan as stated above.   I have edited the note above with my findings. Restart  meds. Switch coumadin to Xarelto.    Logan Granja,MD 11:47 PM

## 2015-02-12 NOTE — Patient Instructions (Signed)
STOP Coumadin.  START Xarelto 20mg  daily.   LABS Today (bmet lipids)  FOLLOW UP in 2 months.

## 2015-02-16 ENCOUNTER — Telehealth (HOSPITAL_COMMUNITY): Payer: Self-pay | Admitting: *Deleted

## 2015-02-16 NOTE — Telephone Encounter (Signed)
PA for xarelto submitted on cover my meds waiting for outcome.

## 2015-02-17 NOTE — Telephone Encounter (Signed)
Xarelto was denied but I faxed appeal today.

## 2015-02-24 ENCOUNTER — Telehealth: Payer: Self-pay

## 2015-02-24 NOTE — Telephone Encounter (Signed)
Prior auth for Xarelto 20mg sent to [facility] via Cover My Meds. 

## 2015-02-26 ENCOUNTER — Other Ambulatory Visit (HOSPITAL_COMMUNITY): Payer: Self-pay | Admitting: Internal Medicine

## 2015-02-26 DIAGNOSIS — R0989 Other specified symptoms and signs involving the circulatory and respiratory systems: Secondary | ICD-10-CM

## 2015-02-26 DIAGNOSIS — G459 Transient cerebral ischemic attack, unspecified: Secondary | ICD-10-CM

## 2015-02-26 DIAGNOSIS — H539 Unspecified visual disturbance: Secondary | ICD-10-CM

## 2015-03-02 ENCOUNTER — Ambulatory Visit (INDEPENDENT_AMBULATORY_CARE_PROVIDER_SITE_OTHER): Payer: Medicare Other | Admitting: *Deleted

## 2015-03-02 DIAGNOSIS — I255 Ischemic cardiomyopathy: Secondary | ICD-10-CM | POA: Diagnosis not present

## 2015-03-02 DIAGNOSIS — I5022 Chronic systolic (congestive) heart failure: Secondary | ICD-10-CM

## 2015-03-02 NOTE — Progress Notes (Signed)
S-ICD remote received.

## 2015-03-04 LAB — CUP PACEART REMOTE DEVICE CHECK
Battery Remaining Percentage: 87 %
Date Time Interrogation Session: 20160828114700
MDC IDC PG SERIAL: 105963
MDC IDC SET ZONE DETECTION INTERVAL: 273 ms
Zone Setting Detection Interval: 240 ms

## 2015-03-05 ENCOUNTER — Ambulatory Visit (HOSPITAL_COMMUNITY)
Admission: RE | Admit: 2015-03-05 | Discharge: 2015-03-05 | Disposition: A | Discharge status: Home / Self Care | Payer: Medicare Other | Source: Ambulatory Visit | Attending: Cardiology | Admitting: Cardiology

## 2015-03-05 DIAGNOSIS — H539 Unspecified visual disturbance: Secondary | ICD-10-CM | POA: Insufficient documentation

## 2015-03-05 DIAGNOSIS — R0989 Other specified symptoms and signs involving the circulatory and respiratory systems: Secondary | ICD-10-CM | POA: Diagnosis not present

## 2015-03-05 DIAGNOSIS — G459 Transient cerebral ischemic attack, unspecified: Secondary | ICD-10-CM | POA: Insufficient documentation

## 2015-03-05 DIAGNOSIS — I2510 Atherosclerotic heart disease of native coronary artery without angina pectoris: Secondary | ICD-10-CM | POA: Diagnosis not present

## 2015-03-05 DIAGNOSIS — F17200 Nicotine dependence, unspecified, uncomplicated: Secondary | ICD-10-CM | POA: Diagnosis not present

## 2015-03-05 DIAGNOSIS — I6521 Occlusion and stenosis of right carotid artery: Secondary | ICD-10-CM | POA: Insufficient documentation

## 2015-03-13 ENCOUNTER — Encounter: Payer: Self-pay | Admitting: *Deleted

## 2015-03-25 ENCOUNTER — Encounter: Payer: Self-pay | Admitting: Internal Medicine

## 2015-04-30 ENCOUNTER — Ambulatory Visit: Payer: Self-pay | Admitting: *Deleted

## 2015-04-30 DIAGNOSIS — Z5181 Encounter for therapeutic drug level monitoring: Secondary | ICD-10-CM

## 2015-04-30 DIAGNOSIS — I513 Intracardiac thrombosis, not elsewhere classified: Secondary | ICD-10-CM

## 2015-06-08 ENCOUNTER — Encounter (HOSPITAL_COMMUNITY): Payer: Self-pay | Admitting: Emergency Medicine

## 2015-06-08 ENCOUNTER — Emergency Department (HOSPITAL_COMMUNITY): Payer: Medicare Other

## 2015-06-08 ENCOUNTER — Observation Stay (HOSPITAL_COMMUNITY)
Admission: EM | Admit: 2015-06-08 | Discharge: 2015-06-09 | Disposition: A | Discharge status: Home / Self Care | Payer: Medicare Other | Attending: Internal Medicine | Admitting: Internal Medicine

## 2015-06-08 DIAGNOSIS — E785 Hyperlipidemia, unspecified: Secondary | ICD-10-CM

## 2015-06-08 DIAGNOSIS — Z23 Encounter for immunization: Secondary | ICD-10-CM | POA: Insufficient documentation

## 2015-06-08 DIAGNOSIS — Z720 Tobacco use: Secondary | ICD-10-CM

## 2015-06-08 DIAGNOSIS — R630 Anorexia: Secondary | ICD-10-CM | POA: Insufficient documentation

## 2015-06-08 DIAGNOSIS — R61 Generalized hyperhidrosis: Secondary | ICD-10-CM | POA: Insufficient documentation

## 2015-06-08 DIAGNOSIS — Z951 Presence of aortocoronary bypass graft: Secondary | ICD-10-CM

## 2015-06-08 DIAGNOSIS — Z7982 Long term (current) use of aspirin: Secondary | ICD-10-CM | POA: Diagnosis not present

## 2015-06-08 DIAGNOSIS — R079 Chest pain, unspecified: Principal | ICD-10-CM

## 2015-06-08 DIAGNOSIS — Z7901 Long term (current) use of anticoagulants: Secondary | ICD-10-CM

## 2015-06-08 DIAGNOSIS — I5022 Chronic systolic (congestive) heart failure: Secondary | ICD-10-CM | POA: Diagnosis not present

## 2015-06-08 DIAGNOSIS — Z9119 Patient's noncompliance with other medical treatment and regimen: Secondary | ICD-10-CM | POA: Diagnosis not present

## 2015-06-08 DIAGNOSIS — I25118 Atherosclerotic heart disease of native coronary artery with other forms of angina pectoris: Secondary | ICD-10-CM

## 2015-06-08 DIAGNOSIS — F1010 Alcohol abuse, uncomplicated: Secondary | ICD-10-CM | POA: Diagnosis not present

## 2015-06-08 DIAGNOSIS — Z79899 Other long term (current) drug therapy: Secondary | ICD-10-CM | POA: Diagnosis not present

## 2015-06-08 DIAGNOSIS — K219 Gastro-esophageal reflux disease without esophagitis: Secondary | ICD-10-CM | POA: Diagnosis not present

## 2015-06-08 DIAGNOSIS — I252 Old myocardial infarction: Secondary | ICD-10-CM | POA: Insufficient documentation

## 2015-06-08 DIAGNOSIS — I2510 Atherosclerotic heart disease of native coronary artery without angina pectoris: Secondary | ICD-10-CM | POA: Diagnosis present

## 2015-06-08 DIAGNOSIS — Z95810 Presence of automatic (implantable) cardiac defibrillator: Secondary | ICD-10-CM | POA: Insufficient documentation

## 2015-06-08 DIAGNOSIS — I255 Ischemic cardiomyopathy: Secondary | ICD-10-CM

## 2015-06-08 DIAGNOSIS — R635 Abnormal weight gain: Secondary | ICD-10-CM | POA: Insufficient documentation

## 2015-06-08 DIAGNOSIS — I25119 Atherosclerotic heart disease of native coronary artery with unspecified angina pectoris: Secondary | ICD-10-CM | POA: Insufficient documentation

## 2015-06-08 DIAGNOSIS — F17210 Nicotine dependence, cigarettes, uncomplicated: Secondary | ICD-10-CM | POA: Insufficient documentation

## 2015-06-08 DIAGNOSIS — Z9861 Coronary angioplasty status: Secondary | ICD-10-CM | POA: Diagnosis not present

## 2015-06-08 DIAGNOSIS — I213 ST elevation (STEMI) myocardial infarction of unspecified site: Secondary | ICD-10-CM

## 2015-06-08 LAB — CBC
HCT: 43.4 % (ref 39.0–52.0)
Hemoglobin: 15 g/dL (ref 13.0–17.0)
MCH: 33.3 pg (ref 26.0–34.0)
MCHC: 34.6 g/dL (ref 30.0–36.0)
MCV: 96.4 fL (ref 78.0–100.0)
Platelets: 283 10*3/uL (ref 150–400)
RBC: 4.5 MIL/uL (ref 4.22–5.81)
RDW: 12.4 % (ref 11.5–15.5)
WBC: 9.4 10*3/uL (ref 4.0–10.5)

## 2015-06-08 LAB — I-STAT TROPONIN, ED: TROPONIN I, POC: 0.03 ng/mL (ref 0.00–0.08)

## 2015-06-08 LAB — BASIC METABOLIC PANEL
Anion gap: 7 (ref 5–15)
BUN: 8 mg/dL (ref 6–20)
CALCIUM: 9.6 mg/dL (ref 8.9–10.3)
CO2: 25 mmol/L (ref 22–32)
Chloride: 106 mmol/L (ref 101–111)
Creatinine, Ser: 0.84 mg/dL (ref 0.61–1.24)
GFR calc Af Amer: >60 mL/min (ref >60)
GLUCOSE: 93 mg/dL (ref 65–99)
POTASSIUM: 4 mmol/L (ref 3.5–5.1)
Sodium: 138 mmol/L (ref 135–145)

## 2015-06-08 LAB — TROPONIN I
Troponin I: 0.06 ng/mL — ABNORMAL HIGH (ref <0.031)
Troponin I: 0.06 ng/mL — ABNORMAL HIGH (ref <0.031)

## 2015-06-08 MED ORDER — ASPIRIN EC 81 MG PO TBEC
81.0000 mg | DELAYED_RELEASE_TABLET | Freq: Every day | ORAL | Status: DC
  Administered 2015-06-09: 81 mg via ORAL
  Filled 2015-06-08: qty 1

## 2015-06-08 MED ORDER — RIVAROXABAN 20 MG PO TABS
20.0000 mg | ORAL_TABLET | Freq: Every day | ORAL | Status: DC
  Administered 2015-06-08: 20 mg via ORAL
  Filled 2015-06-08: qty 1

## 2015-06-08 MED ORDER — ATORVASTATIN CALCIUM 80 MG PO TABS
80.0000 mg | ORAL_TABLET | Freq: Every day | ORAL | Status: DC
  Administered 2015-06-08: 80 mg via ORAL
  Filled 2015-06-08: qty 1

## 2015-06-08 MED ORDER — INFLUENZA VAC SPLIT QUAD 0.5 ML IM SUSY
0.5000 mL | PREFILLED_SYRINGE | INTRAMUSCULAR | Status: AC
  Administered 2015-06-09: 0.5 mL via INTRAMUSCULAR
  Filled 2015-06-08: qty 0.5

## 2015-06-08 MED ORDER — NITROGLYCERIN 0.4 MG SL SUBL
0.4000 mg | SUBLINGUAL_TABLET | SUBLINGUAL | Status: AC | PRN
  Administered 2015-06-08 (×3): 0.4 mg via SUBLINGUAL
  Filled 2015-06-08: qty 1

## 2015-06-08 MED ORDER — ACETAMINOPHEN 325 MG PO TABS: 650.0000 mg | ORAL_TABLET | ORAL | Status: DC | PRN

## 2015-06-08 MED ORDER — ASPIRIN 81 MG PO CHEW
324.0000 mg | CHEWABLE_TABLET | Freq: Once | ORAL | Status: AC
  Administered 2015-06-08: 324 mg via ORAL
  Filled 2015-06-08: qty 4

## 2015-06-08 MED ORDER — ASPIRIN 81 MG PO CHEW: 324.0000 mg | CHEWABLE_TABLET | ORAL | Status: DC

## 2015-06-08 MED ORDER — REGADENOSON 0.4 MG/5ML IV SOLN
0.4000 mg | Freq: Once | INTRAVENOUS | Status: AC
Start: 2015-06-09 — End: 2015-06-09
  Administered 2015-06-09: 0.4 mg via INTRAVENOUS
  Filled 2015-06-08: qty 5

## 2015-06-08 MED ORDER — ASPIRIN 300 MG RE SUPP: 300.0000 mg | RECTAL | Status: DC

## 2015-06-08 MED ORDER — ONDANSETRON HCL 4 MG/2ML IJ SOLN
4.0000 mg | Freq: Four times a day (QID) | INTRAMUSCULAR | Status: DC | PRN
Start: 2015-06-08 — End: 2015-06-09

## 2015-06-08 MED ORDER — FUROSEMIDE 20 MG PO TABS
20.0000 mg | ORAL_TABLET | Freq: Every day | ORAL | Status: DC
Start: 2015-06-09 — End: 2015-06-09
  Administered 2015-06-09: 20 mg via ORAL
  Filled 2015-06-08: qty 1

## 2015-06-08 MED ORDER — NITROGLYCERIN 0.4 MG SL SUBL: 0.4000 mg | SUBLINGUAL_TABLET | SUBLINGUAL | Status: DC | PRN

## 2015-06-08 MED ORDER — LISINOPRIL 5 MG PO TABS
5.0000 mg | ORAL_TABLET | Freq: Every day | ORAL | Status: DC
  Administered 2015-06-09: 5 mg via ORAL
  Filled 2015-06-08: qty 1

## 2015-06-08 MED ORDER — CARVEDILOL 6.25 MG PO TABS
6.2500 mg | ORAL_TABLET | Freq: Two times a day (BID) | ORAL | Status: DC
  Administered 2015-06-08 – 2015-06-09 (×2): 6.25 mg via ORAL
  Filled 2015-06-08 (×2): qty 1
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
## 2015-06-08 NOTE — Plan of Care (Signed)
Problem: Pain Managment: Goal: General experience of comfort will improve Outcome: Completed/Met Date Met:  06/08/15 Pt educated on pain scale and interventions. No complaints of pain at this time. Pt verbalized understanding.
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
## 2015-06-08 NOTE — ED Notes (Signed)
Pt c/o left sided chest pain that radiates around to back onset 10 mins PTA.

## 2015-06-08 NOTE — Plan of Care (Signed)
Problem: Safety: Goal: Ability to remain free from injury will improve Outcome: Completed/Met Date Met:  06/08/15 Pt educated on safety measures put into place. Pt verbalized understanding.

## 2015-06-09 ENCOUNTER — Observation Stay (HOSPITAL_COMMUNITY): Payer: Medicare Other

## 2015-06-09 ENCOUNTER — Observation Stay (HOSPITAL_BASED_OUTPATIENT_CLINIC_OR_DEPARTMENT_OTHER): Payer: Medicare Other

## 2015-06-09 DIAGNOSIS — R079 Chest pain, unspecified: Secondary | ICD-10-CM

## 2015-06-09 DIAGNOSIS — I25118 Atherosclerotic heart disease of native coronary artery with other forms of angina pectoris: Secondary | ICD-10-CM | POA: Diagnosis not present

## 2015-06-09 DIAGNOSIS — I5022 Chronic systolic (congestive) heart failure: Secondary | ICD-10-CM | POA: Diagnosis not present

## 2015-06-09 DIAGNOSIS — F1010 Alcohol abuse, uncomplicated: Secondary | ICD-10-CM | POA: Diagnosis not present

## 2015-06-09 LAB — BASIC METABOLIC PANEL
Anion gap: 7 (ref 5–15)
BUN: 11 mg/dL (ref 6–20)
CALCIUM: 9.5 mg/dL (ref 8.9–10.3)
CO2: 24 mmol/L (ref 22–32)
CREATININE: 0.84 mg/dL (ref 0.61–1.24)
Chloride: 108 mmol/L (ref 101–111)
GFR calc Af Amer: >60 mL/min (ref >60)
GLUCOSE: 92 mg/dL (ref 65–99)
POTASSIUM: 4.3 mmol/L (ref 3.5–5.1)
Sodium: 139 mmol/L (ref 135–145)

## 2015-06-09 LAB — NM MYOCAR MULTI W/SPECT W/WALL MOTION / EF
CHL CUP MPHR: 181 {beats}/min
CHL CUP NUCLEAR SDS: 3
CHL CUP NUCLEAR SRS: 25
CSEPHR: 52 %
Estimated workload: 1 METS
Exercise duration (min): 4 min
LV sys vol: 24 mL
LVDIAVOL: 68 mL
Peak HR: 95 {beats}/min
RATE: 0
RPE: 6360
SSS: 28
TID: 1

## 2015-06-09 LAB — CBC
HEMATOCRIT: 39.7 % (ref 39.0–52.0)
Hemoglobin: 13.9 g/dL (ref 13.0–17.0)
MCH: 33.7 pg (ref 26.0–34.0)
MCHC: 35 g/dL (ref 30.0–36.0)
MCV: 96.4 fL (ref 78.0–100.0)
Platelets: 237 10*3/uL (ref 150–400)
RBC: 4.12 MIL/uL — ABNORMAL LOW (ref 4.22–5.81)
RDW: 12.7 % (ref 11.5–15.5)
WBC: 6.8 10*3/uL (ref 4.0–10.5)

## 2015-06-09 LAB — TROPONIN I: Troponin I: 0.05 ng/mL — ABNORMAL HIGH (ref <0.031)

## 2015-06-09 LAB — BRAIN NATRIURETIC PEPTIDE: B NATRIURETIC PEPTIDE 5: 110.8 pg/mL — AB (ref 0.0–100.0)

## 2015-06-09 MED ORDER — NITROGLYCERIN 0.4 MG/SPRAY TL SOLN: 1.0000 | Status: DC | PRN

## 2015-06-09 MED ORDER — TECHNETIUM TC 99M SESTAMIBI GENERIC - CARDIOLITE
10.0000 | Freq: Once | INTRAVENOUS | Status: AC | PRN
  Administered 2015-06-09: 10 via INTRAVENOUS

## 2015-06-09 MED ORDER — REGADENOSON 0.4 MG/5ML IV SOLN
INTRAVENOUS | Status: AC
  Filled 2015-06-09: qty 5

## 2015-06-09 MED ORDER — TECHNETIUM TC 99M SESTAMIBI GENERIC - CARDIOLITE
30.0000 | Freq: Once | INTRAVENOUS | Status: AC | PRN
  Administered 2015-06-09: 30 via INTRAVENOUS

## 2015-06-09 NOTE — Progress Notes (Signed)
Patient Name: Logan [name] Date of Encounter: 06/09/2015  Principal Problem:   Chest pain at rest Active Problems:   Tobacco abuse   CAD (coronary artery disease)   Hyperlipidemia   Chronic systolic congestive heart failure (HCC)   Ischemic cardiomyopathy   Long term (current) use of anticoagulants   Mural thrombus of heart (HCC)   ETOH abuse   S/P CABG (coronary artery bypass graft)   Chest pain with moderate risk for cardiac etiology   Primary Cardiologist: Dr Graciela Husbands  Patient Profile: CABG x 3 in 2014 and ischemic cardiomyopathy s/p ICD admitted 12/05 with CP.   SUBJECTIVE: No chest pain, no SOB  OBJECTIVE Filed Vitals:   06/08/15 1717 06/08/15 2035 06/09/15 0500 06/09/15 0843  BP: 102/66 108/64 119/68 106/68  Pulse: 62 69 59 57  Temp: 98.2 F (36.8 C) 98.7 F (37.1 C) 97.9 F (36.6 C)   TempSrc: Oral Oral Oral   Resp:  Height:  (1.753 m)     Weight: 167 lb 9.6 oz (76.023 kg)  166 lb 12.8 oz (75.66 kg)   SpO2: 96% 96% 95%     Intake/Output Summary (Last 24 hours) at 06/09/15 0945 Last data filed at 06/09/15 0500  Gross per 24 hour  Intake    360 ml  Output    200 ml  Net    160 ml   Filed Weights   06/08/15 1004 06/08/15 1717 06/09/15 0500  Weight: 160 lb (72.576 kg) 167 lb 9.6 oz (76.023 kg) 166 lb 12.8 oz (75.66 kg)    PHYSICAL EXAM General: Well developed, well nourished, male in no acute distress. Head: Normocephalic, atraumatic.  Neck: Supple without bruits, JVD not elevated. Lungs:  Resp regular and unlabored, CTA. Heart: RRR, S1, S2, no S3, S4, or murmur; no rub. Abdomen: Soft, non-tender, non-distended, BS + x 4.  Extremities: No clubbing, cyanosis, edema.  Neuro: Alert and oriented X 3. Moves all extremities spontaneously. Psych: Normal affect.  LABS: CBC: Recent Labs  06/08/15 1020 06/09/15 0624  WBC 9.4 6.8  HGB 15.0 13.9  HCT 43.4 39.7  MCV 96.4 96.4  PLT 283 237   Basic Metabolic Panel: Recent Labs  06/08/15 1020 06/09/15 0624  NA 138 139  K 4.0 4.3  CL 106 108  CO2 25 24  GLUCOSE 93 92  BUN 8 11  CREATININE 0.84 0.84  CALCIUM 9.6 9.5   Cardiac Enzymes: Recent Labs  06/08/15 1807 06/08/15 2302 06/09/15 0624  TROPONINI 0.06* 0.06* 0.05*    Recent Labs  06/08/15 1044  TROPIPOC 0.03   BNP:  B NATRIURETIC PEPTIDE  Date/Time Value Ref Range Status  06/09/2015 06:24 AM 110.8* 0.0 - 100.0 pg/mL Final   TELE:   SR, ST, seen in nuc med     Radiology/Studies: Dg Chest 2 View  06/08/2015  CLINICAL DATA:  Left-sided chest and shoulder pain since this morning. EXAM: CHEST  2 VIEW COMPARISON:  09/20/2014 FINDINGS: Heart is mildly enlarged. Prior CABG. Subcutaneous lead overlying the sternum is again noted and unchanged. Lungs are clear. No effusions. No acute bony abnormality. IMPRESSION: Mild cardiomegaly.  No active disease. Electronically Signed   By: Charlett Nose M.D.   On: 06/08/2015 11:29     Current Medications:  . aspirin  324 mg Oral NOW   Or  . aspirin  300 mg Rectal NOW  . aspirin EC  81 mg Oral Daily  . atorvastatin  80 mg Oral  q1800  . carvedilol  6.25 mg Oral BID WC  . furosemide  20 mg Oral Daily  . Influenza vac split quadrivalent PF  0.5 mL Intramuscular Tomorrow-1000  . lisinopril  5 mg Oral Daily  . regadenoson      . rivaroxaban  20 mg Oral Q supper      ASSESSMENT AND PLAN: Principal Problem:   Chest pain at rest - minimal elevation in enzymes  - no clear crescendo/decrescendo pattern - OK to do MV today  Active Problems:   Tobacco abuse - cessation encouraged    CAD (coronary artery disease) - see above    Hyperlipidemia - continue statin    Chronic systolic congestive heart failure (HCC)  - volume status OK now    Ischemic cardiomyopathy - on ASA, BB, ACE    Long term (current) use of anticoagulants   Mural thrombus of heart (HCC) - On Xarelto    ETOH abuse - cessation encouraged.    S/P CABG (coronary artery bypass  graft) - see above    Chest pain with moderate risk for cardiac etiology - MV today  Plan - d/c if MV negative.  Melida Quitter , PA-C 9:45 AM 06/09/2015

## 2015-06-09 NOTE — Plan of Care (Signed)
Problem: Tissue Perfusion: Goal: Risk factors for ineffective tissue perfusion will decrease Outcome: Completed/Met Date Met:  06/09/15 Pt educated on VTE prophylaxis. Pt currently on xarleto. Pt verbalizes understanding.

## 2015-06-09 NOTE — Care Management Obs Status (Signed)
MEDICARE OBSERVATION STATUS NOTIFICATION   Patient Details  Name: Logan [name] MRN: [id number] Date of Birth: 11-01-1975   Medicare Observation Status Notification Given:  Yes    Elliot Cousin, RN 06/09/2015, 2:30 PM

## 2015-06-09 NOTE — Progress Notes (Signed)
Lexiscan MV performed 

## 2015-06-09 NOTE — Progress Notes (Addendum)
Personally reviewed and discussed nuclear stress images with Dr. Mayford Knife. There is extensive scar with minimal peri-infarct ischemia. Areas of scar correlated with findings on his cardiac MRI in 2014. Troponin remained mildly elevated but did not demonstrate typical rise and fall as expected with ACS - more consistent with significant cardiomyopathy. He is chest pain free. BNP mildly elevated. He appears appropriate for discharge at this time. Follow-up with Dr. Gala Romney in the CHF clinic.  Chrystie Nose, MD, [facility] Attending Cardiologist [facility]

## 2015-06-09 NOTE — Plan of Care (Signed)
Problem: Health Behavior/Discharge Planning: Goal: Ability to manage health-related needs will improve Outcome: Completed/Met Date Met:  06/09/15 Pt educated on compliance with medications and treatment plan. Pt verbalized understanding.

## 2015-06-09 NOTE — Discharge Summary (Signed)
CARDIOLOGY DISCHARGE SUMMARY   Patient ID: Logan [name] MRN: [id number] DOB/AGE: 1975/08/26 39 y.o.  Admit date: 06/08/2015 Discharge date: 06/09/2015  PCP: No primary care provider on file. Primary Cardiologist: Dr Graciela Husbands, Dr Gala Romney  Primary Discharge Diagnosis:  Chest pain at rest - minimal troponin elevation, had MV, OK for d/c  Secondary Discharge Diagnosis:  Active Problems:   Tobacco abuse   CAD (coronary artery disease)   Hyperlipidemia   Chronic systolic congestive heart failure (HCC)   Ischemic cardiomyopathy   Long term (current) use of anticoagulants   Mural thrombus of heart (HCC)   ETOH abuse   S/P CABG (coronary artery bypass graft)   Chest pain with moderate risk for cardiac etiology  Procedures: [facility] Course: Logan [name] is a 39 y.o. male with a history of CABG 2014, S-CHF, and ICM.   He had chest pain and came to the hospital where he was admitted for further evaluation and treatment.   His initial troponin was mildly elevated but other troponin's were not significantly different. CBC and BMET were normal. BNP had only minimal elevation.  His chest pain resolved PTA with no medication, he got SL NTG x 3 in the ER, unclear if it helped. His ECG had no new changes. Since his enzymes were not consistent with an MI, a MV was performed.      He had the MV on 12/06, he tolerated it well. Results are below. The results were reviewed by Dr Rennis Golden and Dr Mayford Knife (reader). Although it is abnormal, images are poor and ischemia is minimal. The abnormality is mostly due to scar. His pain was atypical in that it was positional and resolved without intervention. Dr Rennis Golden and Dr Mayford Knife felt no further inpatient workup was indicated.   Logan [name] is ambulating without chest pain or SOB. He is considered stable for discharge, to follow up as an outpatient.   Labs:   Lab Results  Component Value Date   WBC 6.8 06/09/2015   HGB 13.9 06/09/2015   HCT 39.7 06/09/2015   MCV 96.4 06/09/2015   PLT 237 06/09/2015     Recent Labs Lab 06/09/15 0624  NA 139  K 4.3  CL 108  CO2 24  BUN 11  CREATININE 0.84  CALCIUM 9.5  GLUCOSE 92    Recent Labs  06/08/15 1807 06/08/15 2302 06/09/15 0624  TROPONINI 0.06* 0.06* 0.05*   B NATRIURETIC PEPTIDE  Date/Time Value Ref Range Status  06/09/2015 06:24 AM 110.8* 0.0 - 100.0 pg/mL Final    Radiology: Dg Chest 2 View 06/08/2015  CLINICAL DATA:  Left-sided chest and shoulder pain since this morning. EXAM: CHEST  2 VIEW COMPARISON:  09/20/2014 FINDINGS: Heart is mildly enlarged. Prior CABG. Subcutaneous lead overlying the sternum is again noted and unchanged. Lungs are clear. No effusions. No acute bony abnormality. IMPRESSION: Mild cardiomegaly.  No active disease. Electronically Signed   By: Charlett Nose M.D.   On: 06/08/2015 11:29   Nm Myocar Multi W/spect W/wall Motion / Ef 06/09/2015   There was no ST segment deviation noted during stress.  Defect 1: There is a large nonversible defect of severe severity present in the basal inferoseptal, basal inferior, mid inferoseptal, mid inferior and apical inferior location.  Defect 2: There is a medium primarily fixed defect of severe severity present in the basal inferolateral, mid inferolateral and apical lateral location. There is some mild reversibility. This is consistent with prior infarct with mild peri  infarct ischemia .  Defect 3: There is a large defect of severe severity present in the basal anteroseptal, mid anteroseptal, apical anterior and apical septal location. This is consistent with scar with no ischemia.  This is a high risk study due to reduced EF and large area of scar.  Findings consistent with prior myocardial infarction with peri-infarct ischemia.  EF was inaccurately calculated. Consider echo to further assess EF if clinically indicated.    EKG: 06/08/2015 SR, Lateral T wave abnormalities, no different from 09/2014  ECG  FOLLOW UP PLANS AND APPOINTMENTS Allergies  Allergen Reactions  . Adhesive [Tape] Rash    "paper tape is ok" (07/09/2012)  . Bee Venom Anaphylaxis  . Penicillins Rash    Has patient had a PCN reaction causing immediate rash, facial/tongue/throat swelling, SOB or lightheadedness with hypotension: Yes Has patient had a PCN reaction causing severe rash involving mucus membranes or skin necrosis: No Has patient had a PCN reaction that required hospitalization No Has patient had a PCN reaction occurring within the last 10 years: No If all of the above answers are "NO", then may proceed with Cephalosporin use.      Medication List    TAKE these medications        aspirin EC 81 MG tablet  Take 1 tablet (81 mg total) by mouth daily.     atorvastatin 80 MG tablet  Commonly known as:  LIPITOR  TAKE ONE TABLET BY MOUTH ONCE DAILY AT  6:00  PM     carvedilol 6.25 MG tablet  Commonly known as:  COREG  Take 1 tablet (6.25 mg total) by mouth 2 (two) times daily with a meal.     furosemide 20 MG tablet  Commonly known as:  LASIX  Take 1 tablet (20 mg total) by mouth daily.     lisinopril 5 MG tablet  Commonly known as:  PRINIVIL,ZESTRIL  Take 1 tablet (5 mg total) by mouth daily.     nitroGLYCERIN 0.4 MG/SPRAY spray  Commonly known as:  NITROLINGUAL  Place 1 spray under the tongue every 5 (five) minutes x 3 doses as needed for chest pain.     rivaroxaban 20 MG Tabs tablet  Commonly known as:  XARELTO  Take 1 tablet (20 mg total) by mouth daily with supper.     spironolactone 25 MG tablet  Commonly known as:  ALDACTONE  Take 1 tablet (25 mg total) by mouth daily.        Discharge Instructions    (HEART FAILURE PATIENTS) Call MD:  Anytime you have any of the following symptoms: 1) 3 pound weight gain in 24 hours or 5 pounds in 1 week 2) shortness of breath, with or without a dry hacking cough 3) swelling in the hands, feet or stomach 4) if you have to sleep on extra pillows  at night in order to breathe.    Complete by:  As directed      Diet - low sodium heart healthy    Complete by:  As directed      Increase activity slowly    Complete by:  As directed           Follow-up Information    Follow up with Arvilla Meres, MD.   Specialty:  Cardiology   Why:  The office will call.   Contact information:   [address] [place] [address] [phone]       Follow up with Sherryl Manges, MD.  Specialty:  Cardiology   Why:  As scheduled   Contact information:   [address] [place] [address] [phone]       BRING ALL MEDICATIONS WITH YOU TO FOLLOW UP APPOINTMENTS  Time spent with patient to include physician time: 43 min Signed: Theodore Demark, PA-C 06/09/2015, 4:03 PM Co-Sign MD

## 2015-06-25 ENCOUNTER — Telehealth (HOSPITAL_COMMUNITY): Payer: Self-pay | Admitting: *Deleted

## 2015-06-25 ENCOUNTER — Ambulatory Visit (HOSPITAL_COMMUNITY)
Admission: RE | Admit: 2015-06-25 | Discharge: 2015-06-25 | Disposition: A | Discharge status: Home / Self Care | Payer: Medicare Other | Source: Ambulatory Visit | Attending: Internal Medicine | Admitting: Internal Medicine

## 2015-06-25 ENCOUNTER — Other Ambulatory Visit (HOSPITAL_COMMUNITY): Payer: Self-pay | Admitting: Adult Health

## 2015-06-25 ENCOUNTER — Encounter (HOSPITAL_COMMUNITY): Payer: Self-pay | Admitting: Cardiology

## 2015-06-25 VITALS — BP 119/66 | HR 70 | Ht 68.0 in | Wt 170.1 lb

## 2015-06-25 DIAGNOSIS — I480 Paroxysmal atrial fibrillation: Secondary | ICD-10-CM | POA: Insufficient documentation

## 2015-06-25 DIAGNOSIS — F1010 Alcohol abuse, uncomplicated: Secondary | ICD-10-CM | POA: Insufficient documentation

## 2015-06-25 DIAGNOSIS — Z8673 Personal history of transient ischemic attack (TIA), and cerebral infarction without residual deficits: Secondary | ICD-10-CM | POA: Diagnosis not present

## 2015-06-25 DIAGNOSIS — I2510 Atherosclerotic heart disease of native coronary artery without angina pectoris: Secondary | ICD-10-CM

## 2015-06-25 DIAGNOSIS — I255 Ischemic cardiomyopathy: Secondary | ICD-10-CM | POA: Diagnosis not present

## 2015-06-25 DIAGNOSIS — E785 Hyperlipidemia, unspecified: Secondary | ICD-10-CM | POA: Insufficient documentation

## 2015-06-25 DIAGNOSIS — F17210 Nicotine dependence, cigarettes, uncomplicated: Secondary | ICD-10-CM | POA: Insufficient documentation

## 2015-06-25 DIAGNOSIS — Z79899 Other long term (current) drug therapy: Secondary | ICD-10-CM | POA: Insufficient documentation

## 2015-06-25 DIAGNOSIS — Z7901 Long term (current) use of anticoagulants: Secondary | ICD-10-CM | POA: Diagnosis not present

## 2015-06-25 DIAGNOSIS — I5022 Chronic systolic (congestive) heart failure: Secondary | ICD-10-CM | POA: Diagnosis present

## 2015-06-25 DIAGNOSIS — I213 ST elevation (STEMI) myocardial infarction of unspecified site: Secondary | ICD-10-CM | POA: Diagnosis not present

## 2015-06-25 DIAGNOSIS — R079 Chest pain, unspecified: Secondary | ICD-10-CM

## 2015-06-25 DIAGNOSIS — I252 Old myocardial infarction: Secondary | ICD-10-CM | POA: Diagnosis not present

## 2015-06-25 DIAGNOSIS — Z951 Presence of aortocoronary bypass graft: Secondary | ICD-10-CM | POA: Insufficient documentation

## 2015-06-25 DIAGNOSIS — Z720 Tobacco use: Secondary | ICD-10-CM

## 2015-06-25 DIAGNOSIS — R0789 Other chest pain: Secondary | ICD-10-CM | POA: Diagnosis not present

## 2015-06-25 DIAGNOSIS — I513 Intracardiac thrombosis, not elsewhere classified: Secondary | ICD-10-CM

## 2015-06-25 DIAGNOSIS — Z7982 Long term (current) use of aspirin: Secondary | ICD-10-CM | POA: Diagnosis not present

## 2015-06-25 DIAGNOSIS — I2584 Coronary atherosclerosis due to calcified coronary lesion: Secondary | ICD-10-CM

## 2015-06-25 LAB — CBC
HEMATOCRIT: 40.6 % (ref 39.0–52.0)
HEMOGLOBIN: 14.4 g/dL (ref 13.0–17.0)
MCH: 34 pg (ref 26.0–34.0)
MCHC: 35.5 g/dL (ref 30.0–36.0)
MCV: 95.8 fL (ref 78.0–100.0)
Platelets: 284 10*3/uL (ref 150–400)
RBC: 4.24 MIL/uL (ref 4.22–5.81)
RDW: 12.2 % (ref 11.5–15.5)
WBC: 8.8 10*3/uL (ref 4.0–10.5)

## 2015-06-25 LAB — BASIC METABOLIC PANEL
ANION GAP: 8 (ref 5–15)
BUN: 9 mg/dL (ref 6–20)
CO2: 26 mmol/L (ref 22–32)
Calcium: 9.5 mg/dL (ref 8.9–10.3)
Chloride: 104 mmol/L (ref 101–111)
Creatinine, Ser: 0.84 mg/dL (ref 0.61–1.24)
GFR calc Af Amer: >60 mL/min (ref >60)
GLUCOSE: 92 mg/dL (ref 65–99)
POTASSIUM: 4.2 mmol/L (ref 3.5–5.1)
Sodium: 138 mmol/L (ref 135–145)

## 2015-06-25 LAB — PROTIME-INR
INR: 1.03 (ref 0.00–1.49)
Prothrombin Time: 13.7 seconds (ref 11.6–15.2)

## 2015-06-25 MED ORDER — SPIRONOLACTONE 25 MG PO TABS: 12.5000 mg | ORAL_TABLET | Freq: Every day | ORAL | Status: DC

## 2015-06-25 MED ORDER — FUROSEMIDE 20 MG PO TABS: 20.0000 mg | ORAL_TABLET | ORAL | Status: DC | PRN

## 2015-06-25 NOTE — Telephone Encounter (Signed)
Left and right heart cath Pre cert # [id number]

## 2015-06-25 NOTE — Progress Notes (Signed)
ADVANCED HF CLINIC NOTE  Patient ID: Logan [name], male   DOB: 21-Mar-1976, 39 y.o.   MRN: [id number] PCP: None   HPI:  Mr. Wares is a 39 year old male with CAD s/p CABG x 3; LIMA-LAD, SVG -obtuse marginal, SVG-RCA (07/2012), ischemic cardiomyopathy- Hx of anterolateral MI with DES to LAD; reinfarction 02/05/12 while in [place], [place] with thrombosis of the stent and repeat stenting. He also has a history of nicotine dependence, previous ETOH abuse, TIA 05/2014, PAF and non-compliance.  Admitted early December 2016 with CP. Had myoview extensive scar with minimal peri-infarct ischemia. Areas of scar correlated with findings on his cardiac MRI in 2014. Troponin remained mildly elevated but did not demonstrate typical rise and fall as expected with ACS - more consistent with significant cardiomyopathy. EF not  calculated. Later discharged home.   He presents today for HF follow up. Overall feeling ok. Mild dyspnea with exertion. Denies orthopnea/PND . Having intermittent CP at rest. Taking all meds. Smoking 5-6 days a week. Drinks a beer a week.  On disability.  CPX Results 06/2012 Peak VO2: 21.9 ml/kg/min  predicted peak VO2: 52.9% VE/VCO2 slope: 33.7 OUES: 1.53 Peak RER: 1.31 Ventilatory Threshold: 17.4 % predicted peak VO2: 42%   L/RHC 07/2012 (prior to CABG) RA = 4  RV = 35/2/6  PA = 30/14 (20)  PCW = 15 v = 25  Fick cardiac output/index = 3.7/2.1  PVR = 1.4 Woods  FA sat = 97%  PA sat = 62%, 65%   Assessment:  1. 3 vessel disease with totally occluded RCA, severe disease in LCx, and 80% in-stent restenosis in LAD.  2. Low cardiac output, elevated filling pressures.   ECHO 04/2013: EF 20% with mural thrombus, grade I diastolic dysfunction, moderate RV dilation, moderate MR.   Limited ECHO (7/15): moderate LV dilation, EF 10-15%, restrictive diastolic function, there was an apical thrombus noted, moderate functional MR, moderately decreased RV systolic function.   Labs (7/15):  K 4.4, creatinine 0.87, HCT 42.6 Labs (8/15): LDL 158, HDL 56 Labs 05/2014: K 4.1 Creatinine 0.87   SH: Married, lives in [place], 2 kids, on disability, smokes 1 ppd, +ETOH  FH: Brother with MI at 33, grandmother MI at 28, mother SCD  ROS: All systems negative except as listed in HPI, PMH and Problem List.  Past Medical History  Diagnosis Date  . GERD (gastroesophageal reflux disease)   . CAD (coronary artery disease) 01/22/12    a.anterolateral MI with DES to LAD EF 25% b. reinfarction 8/13 with thrombosis of stent c. s/p CABG x 3: LIMA-LAD, SVG-OM, SVG-RCA (07/2012)   . Hyperlipidemia   . Tobacco abuse   . Chronic systolic CHF (congestive heart failure) (HCC)     a. (07/2012) EF 20-25% diff HK with reg wall abnl, HK anterolat wall, akinesis inferosep, mod MR;  b. 01/2014 Echo: EF 10-15%, glob HK, mod MR.  . Noncompliance   . Syncope   . LV (left ventricular) mural thrombus (HCC)     "just found it in Oct" (07/09/2012)  . Ischemic cardiomyopathy     a.  01/2014 Echo: EF 10-15%, glob HK;  b. 03/2014 S/P Subcutaneous ICD.  Marland Kitchen Anginal pain (HCC)   . Moderate mitral regurgitation   . Myocardial infarction (HCC) 01/2012; 02/2012; 04/2012  . Stroke [facility]) 04/2014    TIA in the setting of medical non-compliance with coumadin requiring hospitalization.    Current Outpatient Prescriptions  Medication Sig Dispense Refill  . aspirin EC 81 MG  tablet Take 1 tablet (81 mg total) by mouth daily. 30 tablet 6  . atorvastatin (LIPITOR) 80 MG tablet TAKE ONE TABLET BY MOUTH ONCE DAILY AT  6:00  PM 30 tablet 3  . carvedilol (COREG) 6.25 MG tablet Take 1 tablet (6.25 mg total) by mouth 2 (two) times daily with a meal. 60 tablet 6  . furosemide (LASIX) 20 MG tablet Take 1 tablet (20 mg total) by mouth daily. 30 tablet 3  . lisinopril (PRINIVIL,ZESTRIL) 5 MG tablet Take 1 tablet (5 mg total) by mouth daily. 30 tablet 6  . nitroGLYCERIN (NITROLINGUAL) 0.4 MG/SPRAY spray Place 1 spray under the tongue every 5  (five) minutes x 3 doses as needed for chest pain. 12 g 12  . rivaroxaban (XARELTO) 20 MG TABS tablet Take 1 tablet (20 mg total) by mouth daily with supper. 30 tablet 3  . spironolactone (ALDACTONE) 25 MG tablet Take 1 tablet (25 mg total) by mouth daily. (Patient not taking: Reported on 06/08/2015) 30 tablet 2   No current facility-administered medications for this encounter.    Filed Vitals:   06/25/15 1514  BP: 119/66  Pulse: 70  Height: 5' 8" (1.727 m)  Weight: 170 lb 1.9 oz (77.166 kg)  SpO2: 99%    PHYSICAL EXAM: General:   No respiratory difficulty; GF present  HEENT: normal Neck: supple. JVP 5-6 cm. Carotids 2+ bilat; no bruits. No lymphadenopathy or thryomegaly appreciated. Cor: PMI nondisplaced. Regular rate & rhythm. No rubs, gallops or murmurs. Midline incision approximated and healed Lungs: prolonged expiratory phase.  Abdomen: soft, nontender, nondistended. No hepatosplenomegaly. No bruits or masses. Good bowel sounds. Extremities: no cyanosis, clubbing, rash, no edema Neuro: alert & oriented x 3, cranial nerves grossly intact. moves all 4 extremities w/o difficulty. Affect pleasant.  ASSESSMENT & PLAN:  1) Chronic systolic HF: Echo (11/15) with EF 10-15%. Ischemic cardiomyopathy, not much improvement in LV function post-CABG.  Has Subcutaneous [employer] ICD.  NYHA class II-III symptoms. Volume status ok.   - Continue carvedilol 6.25 mg bid, spiro 25mg daily and lisinopril 5 mg daily.  - Takes lasix as needed 2) Chest pain/CAD: s/p CABG x 3 in 1/14. - Recently admitted with CP. Troponin mildly elevated. Continues with intermittent CP at rest - I reviewed Myoview images personally and he has extensive scarring with mild peri-infarct ischemia - Both he and his wife are concerned about worsening CAD. We discussed options. They would like to pursue cath. Will plan R/L cath in am. We discussed risks. - Continue b-blocker, atorvastatin and ASA 81.  3) Mural  Thrombus: Noted again on most recent echo.  Not able to handle coumadin. Continue to Xarelto for thrombus and PAF.  4) OSA:  Sleep study not suggestive of OSA.  5) Hyperlipidemia: On atorvastatin 80 mg daily.  6) ETOH abuse: Has cut back 7) Current Smoker: Smoking 5-6 PPD. Encouraged to stop all together.   8) TIA- 05/2014. On Xarelto and statin. 9) PAF - This patients CHA2DS2-VASc Score and unadjusted Ischemic Stroke Rate (% per year) is equal to 4.8 % stroke rate/year from a score of 4  Unable to manage coumadin. Continue Xarelto.   Follow up 4 weeks.   Amy Clegg NP-C  06/25/2015   Patient seen and examined with Amy Clegg, NP. We discussed all aspects of the encounter. I agree with the assessment and plan as stated above.   I have edited the note above. I reviewed Myoview images personally and discussed risks of cath   in detail. We will proceed with cath tomorrow am.   Bensimhon, Daniel,MD 9:56 PM

## 2015-06-25 NOTE — Patient Instructions (Signed)
Your physician has requested that you have a cardiac catheterization. Cardiac catheterization is used to diagnose and/or treat various heart conditions. Doctors may recommend this procedure for a number of different reasons. The most common reason is to evaluate chest pain. Chest pain can be a symptom of coronary artery disease (CAD), and cardiac catheterization can show whether plaque is narrowing or blocking your heart's arteries. This procedure is also used to evaluate the valves, as well as measure the blood flow and oxygen levels in different parts of your heart. For further information please visit [email or web address] Please follow instruction sheet, as given.  Your physician has requested that you have an echocardiogram. Echocardiography is a painless test that uses sound waves to create images of your heart. It provides your doctor with information about the size and shape of your heart and how well your heart's chambers and valves are working. This procedure takes approximately one hour. There are no restrictions for this procedure.  CHANGE Spironolactone to 12.5, one half tab daily CHANGE Lasix to 20 mg, as needed  Your physician recommends that you schedule a follow-up appointment in: 4 weeks with Dr.Bensimhon

## 2015-06-26 ENCOUNTER — Encounter (HOSPITAL_COMMUNITY)
Admission: RE | Disposition: A | Discharge status: Home / Self Care | Payer: Self-pay | Source: Ambulatory Visit | Attending: Internal Medicine

## 2015-06-26 ENCOUNTER — Encounter (HOSPITAL_COMMUNITY): Payer: Self-pay | Admitting: Internal Medicine

## 2015-06-26 ENCOUNTER — Ambulatory Visit (HOSPITAL_COMMUNITY)
Admission: RE | Admit: 2015-06-26 | Discharge: 2015-06-26 | Disposition: A | Discharge status: Home / Self Care | Payer: Medicare Other | Source: Ambulatory Visit | Attending: Internal Medicine | Admitting: Internal Medicine

## 2015-06-26 DIAGNOSIS — Z7982 Long term (current) use of aspirin: Secondary | ICD-10-CM | POA: Insufficient documentation

## 2015-06-26 DIAGNOSIS — I2510 Atherosclerotic heart disease of native coronary artery without angina pectoris: Secondary | ICD-10-CM | POA: Insufficient documentation

## 2015-06-26 DIAGNOSIS — R9439 Abnormal result of other cardiovascular function study: Secondary | ICD-10-CM | POA: Insufficient documentation

## 2015-06-26 DIAGNOSIS — I5022 Chronic systolic (congestive) heart failure: Secondary | ICD-10-CM | POA: Insufficient documentation

## 2015-06-26 DIAGNOSIS — Z9119 Patient's noncompliance with other medical treatment and regimen: Secondary | ICD-10-CM | POA: Insufficient documentation

## 2015-06-26 DIAGNOSIS — Z8673 Personal history of transient ischemic attack (TIA), and cerebral infarction without residual deficits: Secondary | ICD-10-CM | POA: Insufficient documentation

## 2015-06-26 DIAGNOSIS — I2582 Chronic total occlusion of coronary artery: Secondary | ICD-10-CM | POA: Diagnosis not present

## 2015-06-26 DIAGNOSIS — Z7901 Long term (current) use of anticoagulants: Secondary | ICD-10-CM | POA: Diagnosis not present

## 2015-06-26 DIAGNOSIS — E785 Hyperlipidemia, unspecified: Secondary | ICD-10-CM | POA: Insufficient documentation

## 2015-06-26 DIAGNOSIS — F17210 Nicotine dependence, cigarettes, uncomplicated: Secondary | ICD-10-CM | POA: Diagnosis not present

## 2015-06-26 DIAGNOSIS — I255 Ischemic cardiomyopathy: Secondary | ICD-10-CM | POA: Insufficient documentation

## 2015-06-26 DIAGNOSIS — I2583 Coronary atherosclerosis due to lipid rich plaque: Secondary | ICD-10-CM

## 2015-06-26 DIAGNOSIS — I480 Paroxysmal atrial fibrillation: Secondary | ICD-10-CM | POA: Insufficient documentation

## 2015-06-26 DIAGNOSIS — I340 Nonrheumatic mitral (valve) insufficiency: Secondary | ICD-10-CM | POA: Insufficient documentation

## 2015-06-26 DIAGNOSIS — I252 Old myocardial infarction: Secondary | ICD-10-CM | POA: Insufficient documentation

## 2015-06-26 DIAGNOSIS — T82855D Stenosis of coronary artery stent, subsequent encounter: Secondary | ICD-10-CM | POA: Insufficient documentation

## 2015-06-26 DIAGNOSIS — K219 Gastro-esophageal reflux disease without esophagitis: Secondary | ICD-10-CM | POA: Diagnosis not present

## 2015-06-26 HISTORY — PX: CARDIAC CATHETERIZATION: SHX172

## 2015-06-26 LAB — POCT I-STAT 3, ART BLOOD GAS (G3+)
ACID-BASE DEFICIT: 3 mmol/L — AB (ref 0.0–2.0)
Bicarbonate: 20.8 mEq/L (ref 20.0–24.0)
O2 Saturation: 96 %
PH ART: 7.402 (ref 7.350–7.450)
PO2 ART: 83 mmHg (ref 80.0–100.0)
TCO2: 22 mmol/L (ref 0–100)
pCO2 arterial: 33.5 mmHg — ABNORMAL LOW (ref 35.0–45.0)

## 2015-06-26 LAB — POCT I-STAT 3, VENOUS BLOOD GAS (G3P V)
Acid-base deficit: 3 mmol/L — ABNORMAL HIGH (ref 0.0–2.0)
Acid-base deficit: 3 mmol/L — ABNORMAL HIGH (ref 0.0–2.0)
Bicarbonate: 22.5 mEq/L (ref 20.0–24.0)
Bicarbonate: 22.5 mEq/L (ref 20.0–24.0)
O2 SAT: 61 %
O2 Saturation: 64 %
PCO2 VEN: 40.2 mmHg — AB (ref 45.0–50.0)
PCO2 VEN: 42 mmHg — AB (ref 45.0–50.0)
PH VEN: 7.336 — AB (ref 7.250–7.300)
PH VEN: 7.356 — AB (ref 7.250–7.300)
PO2 VEN: 34 mmHg (ref 30.0–45.0)
TCO2: 24 mmol/L (ref 0–100)
TCO2: 24 mmol/L (ref 0–100)
pO2, Ven: 35 mmHg (ref 30.0–45.0)

## 2015-06-26 SURGERY — RIGHT HEART CATH AND CORONARY/GRAFT ANGIOGRAPHY

## 2015-06-26 MED ORDER — ASPIRIN 81 MG PO CHEW
81.0000 mg | CHEWABLE_TABLET | ORAL | Status: AC
  Administered 2015-06-26: 81 mg via ORAL

## 2015-06-26 MED ORDER — HEPARIN (PORCINE) IN NACL 2-0.9 UNIT/ML-% IJ SOLN
INTRAMUSCULAR | Status: AC
  Filled 2015-06-26: qty 1000

## 2015-06-26 MED ORDER — MIDAZOLAM HCL 2 MG/2ML IJ SOLN
INTRAMUSCULAR | Status: DC | PRN
  Administered 2015-06-26 (×2): 1 mg via INTRAVENOUS

## 2015-06-26 MED ORDER — FENTANYL CITRATE (PF) 100 MCG/2ML IJ SOLN
INTRAMUSCULAR | Status: AC
  Filled 2015-06-26: qty 2

## 2015-06-26 MED ORDER — SODIUM CHLORIDE 0.9 % IJ SOLN: 3.0000 mL | Freq: Two times a day (BID) | INTRAMUSCULAR | Status: DC

## 2015-06-26 MED ORDER — SODIUM CHLORIDE 0.9 % IV SOLN: 250.0000 mL | INTRAVENOUS | Status: DC | PRN

## 2015-06-26 MED ORDER — MIDAZOLAM HCL 2 MG/2ML IJ SOLN
INTRAMUSCULAR | Status: AC
  Filled 2015-06-26: qty 2

## 2015-06-26 MED ORDER — SODIUM CHLORIDE 0.9 % IV SOLN
INTRAVENOUS | Status: DC
  Administered 2015-06-26: 06:00:00 via INTRAVENOUS

## 2015-06-26 MED ORDER — LIDOCAINE HCL (PF) 1 % IJ SOLN
INTRAMUSCULAR | Status: DC | PRN
  Administered 2015-06-26: 09:00:00

## 2015-06-26 MED ORDER — FENTANYL CITRATE (PF) 100 MCG/2ML IJ SOLN
INTRAMUSCULAR | Status: DC | PRN
  Administered 2015-06-26: 25 ug via INTRAVENOUS

## 2015-06-26 MED ORDER — LIDOCAINE HCL (PF) 1 % IJ SOLN
INTRAMUSCULAR | Status: AC
  Filled 2015-06-26: qty 30

## 2015-06-26 MED ORDER — ASPIRIN 81 MG PO CHEW
CHEWABLE_TABLET | ORAL | Status: AC
  Filled 2015-06-26: qty 1

## 2015-06-26 MED ORDER — IOHEXOL 350 MG/ML SOLN
INTRAVENOUS | Status: DC | PRN
  Administered 2015-06-26: 85 mL via INTRACARDIAC

## 2015-06-26 MED ORDER — SODIUM CHLORIDE 0.9 % IV SOLN: INTRAVENOUS | Status: AC

## 2015-06-26 MED ORDER — SODIUM CHLORIDE 0.9 % IJ SOLN: 3.0000 mL | INTRAMUSCULAR | Status: DC | PRN

## 2015-06-26 SURGICAL SUPPLY — 11 items
CATH INFINITI 5 FR JL3.5 (CATHETERS) ×3 IMPLANT
CATH INFINITI 5FR MULTPACK ANG (CATHETERS) ×3 IMPLANT
CATH SWAN GANZ 7F STRAIGHT (CATHETERS) ×3 IMPLANT
KIT HEART LEFT (KITS) ×3 IMPLANT
KIT HEART RIGHT NAMIC (KITS) ×3 IMPLANT
PACK CARDIAC CATHETERIZATION (CUSTOM PROCEDURE TRAY) ×3 IMPLANT
SHEATH PINNACLE 5F 10CM (SHEATH) ×3 IMPLANT
SHEATH PINNACLE 7F 10CM (SHEATH) ×3 IMPLANT
TRANSDUCER W/STOPCOCK (MISCELLANEOUS) ×3 IMPLANT
TUBING CIL FLEX 10 FLL-RA (TUBING) IMPLANT
WIRE EMERALD 3MM-J .035X150CM (WIRE) ×3 IMPLANT

## 2015-06-26 NOTE — Interval H&P Note (Signed)
History and Physical Interval Note:  06/26/2015 7:52 AM  Logan [name]  has presented today for surgery, with the diagnosis of chf  The various methods of treatment have been discussed with the patient and family. After consideration of risks, benefits and other options for treatment, the patient has consented to  Procedure(s): Right/Left Heart Cath and Coronary Angiography (N/A) graft angiography and possible coronary angioplasty as a surgical intervention .  The patient's history has been reviewed, patient examined, no change in status, stable for surgery.  I have reviewed the patient's chart and labs.  Questions were answered to the patient's satisfaction.     Bensimhon, Reuel Boom

## 2015-06-26 NOTE — Discharge Instructions (Signed)
Angiogram, Care After °Refer to this sheet in the next few weeks. These instructions provide you with information about caring for yourself after your procedure. Your health care provider may also give you more specific instructions. Your treatment has been planned according to current medical practices, but problems sometimes occur. Call your health care provider if you have any problems or questions after your procedure. °WHAT TO EXPECT AFTER THE PROCEDURE °After your procedure, it is typical to have the following: °· Bruising at the catheter insertion site that usually fades within 1-2 weeks. °· Blood collecting in the tissue (hematoma) that may be painful to the touch. It should usually decrease in size and tenderness within 1-2 weeks. °HOME CARE INSTRUCTIONS °· Take medicines only as directed by your health care provider. °· You may shower 24-48 hours after the procedure or as directed by your health care provider. Remove the bandage (dressing) and gently wash the site with plain soap and water. Pat the area dry with a clean towel. Do not rub the site, because this may cause bleeding. °· Do not take baths, swim, or use a hot tub until your health care provider approves. °· Check your insertion site every day for redness, swelling, or drainage. °· Do not apply powder or lotion to the site. °· Do not lift over 10 lb (4.5 kg) for 5 days after your procedure or as directed by your health care provider. °· Ask your health care provider when it is okay to: °¨ Return to work or school. °¨ Resume usual physical activities or sports. °¨ Resume sexual activity. °· Do not drive home if you are discharged the same day as the procedure. Have someone else drive you. °· You may drive 24 hours after the procedure unless otherwise instructed by your health care provider. °· Do not operate machinery or power tools for 24 hours after the procedure or as directed by your health care provider. °· If your procedure was done as an  outpatient procedure, which means that you went home the same day as your procedure, a responsible adult should be with you for the first 24 hours after you arrive home. °· Keep all follow-up visits as directed by your health care provider. This is important. °SEEK MEDICAL CARE IF: °· You have a fever. °· You have chills. °· You have increased bleeding from the catheter insertion site. Hold pressure on the site.  CALL 911 °SEEK IMMEDIATE MEDICAL CARE IF: °· You have unusual pain at the catheter insertion site. °· You have redness, warmth, or swelling at the catheter insertion site. °· You have drainage (other than a small amount of blood on the dressing) from the catheter insertion site. °· The catheter insertion site is bleeding, and the bleeding does not stop after 30 minutes of holding steady pressure on the site. °· The area near or just beyond the catheter insertion site becomes pale, cool, tingly, or numb. °  °This information is not intended to replace advice given to you by your health care provider. Make sure you discuss any questions you have with your health care provider. °  °Document Released: 01/06/2005 Document Revised: 07/11/2014 Document Reviewed: 11/21/2012 °Elsevier Interactive Patient Education ©2016 Else[employer] Inc. ° °

## 2015-06-26 NOTE — Progress Notes (Signed)
Site area:  Right groin fa and fv sheaths    Site Prior to Removal:  Level 0 Pressure Applied For:  20 minutes Manual:   Yes   Patient Status During Pull:  stable Post Pull Site:  Level  0 Post Pull Instructions Given:  yes Post Pull Pulses Present: yes Dressing Applied:  tegaderm Bedrest begins @ 0905 Comments:

## 2015-06-26 NOTE — H&P (View-Only) (Signed)
ADVANCED HF CLINIC NOTE  Patient ID: Logan [name], male   DOB: 21-Mar-1976, 39 y.o.   MRN: [id number] PCP: None   HPI:  Mr. Wares is a 39 year old male with CAD s/p CABG x 3; LIMA-LAD, SVG -obtuse marginal, SVG-RCA (07/2012), ischemic cardiomyopathy- Hx of anterolateral MI with DES to LAD; reinfarction 02/05/12 while in [place], [place] with thrombosis of the stent and repeat stenting. He also has a history of nicotine dependence, previous ETOH abuse, TIA 05/2014, PAF and non-compliance.  Admitted early December 2016 with CP. Had myoview extensive scar with minimal peri-infarct ischemia. Areas of scar correlated with findings on his cardiac MRI in 2014. Troponin remained mildly elevated but did not demonstrate typical rise and fall as expected with ACS - more consistent with significant cardiomyopathy. EF not  calculated. Later discharged home.   He presents today for HF follow up. Overall feeling ok. Mild dyspnea with exertion. Denies orthopnea/PND . Having intermittent CP at rest. Taking all meds. Smoking 5-6 days a week. Drinks a beer a week.  On disability.  CPX Results 06/2012 Peak VO2: 21.9 ml/kg/min  predicted peak VO2: 52.9% VE/VCO2 slope: 33.7 OUES: 1.53 Peak RER: 1.31 Ventilatory Threshold: 17.4 % predicted peak VO2: 42%   L/RHC 07/2012 (prior to CABG) RA = 4  RV = 35/2/6  PA = 30/14 (20)  PCW = 15 v = 25  Fick cardiac output/index = 3.7/2.1  PVR = 1.4 Woods  FA sat = 97%  PA sat = 62%, 65%   Assessment:  1. 3 vessel disease with totally occluded RCA, severe disease in LCx, and 80% in-stent restenosis in LAD.  2. Low cardiac output, elevated filling pressures.   ECHO 04/2013: EF 20% with mural thrombus, grade I diastolic dysfunction, moderate RV dilation, moderate MR.   Limited ECHO (7/15): moderate LV dilation, EF 10-15%, restrictive diastolic function, there was an apical thrombus noted, moderate functional MR, moderately decreased RV systolic function.   Labs (7/15):  K 4.4, creatinine 0.87, HCT 42.6 Labs (8/15): LDL 158, HDL 56 Labs 05/2014: K 4.1 Creatinine 0.87   SH: Married, lives in [place], 2 kids, on disability, smokes 1 ppd, +ETOH  FH: Brother with MI at 33, grandmother MI at 28, mother SCD  ROS: All systems negative except as listed in HPI, PMH and Problem List.  Past Medical History  Diagnosis Date  . GERD (gastroesophageal reflux disease)   . CAD (coronary artery disease) 01/22/12    a.anterolateral MI with DES to LAD EF 25% b. reinfarction 8/13 with thrombosis of stent c. s/p CABG x 3: LIMA-LAD, SVG-OM, SVG-RCA (07/2012)   . Hyperlipidemia   . Tobacco abuse   . Chronic systolic CHF (congestive heart failure) (HCC)     a. (07/2012) EF 20-25% diff HK with reg wall abnl, HK anterolat wall, akinesis inferosep, mod MR;  b. 01/2014 Echo: EF 10-15%, glob HK, mod MR.  . Noncompliance   . Syncope   . LV (left ventricular) mural thrombus (HCC)     "just found it in Oct" (07/09/2012)  . Ischemic cardiomyopathy     a.  01/2014 Echo: EF 10-15%, glob HK;  b. 03/2014 S/P Subcutaneous ICD.  Marland Kitchen Anginal pain (HCC)   . Moderate mitral regurgitation   . Myocardial infarction (HCC) 01/2012; 02/2012; 04/2012  . Stroke [facility]) 04/2014    TIA in the setting of medical non-compliance with coumadin requiring hospitalization.    Current Outpatient Prescriptions  Medication Sig Dispense Refill  . aspirin EC 81 MG  tablet Take 1 tablet (81 mg total) by mouth daily. 30 tablet 6  . atorvastatin (LIPITOR) 80 MG tablet TAKE ONE TABLET BY MOUTH ONCE DAILY AT  6:00  PM 30 tablet 3  . carvedilol (COREG) 6.25 MG tablet Take 1 tablet (6.25 mg total) by mouth 2 (two) times daily with a meal. 60 tablet 6  . furosemide (LASIX) 20 MG tablet Take 1 tablet (20 mg total) by mouth daily. 30 tablet 3  . lisinopril (PRINIVIL,ZESTRIL) 5 MG tablet Take 1 tablet (5 mg total) by mouth daily. 30 tablet 6  . nitroGLYCERIN (NITROLINGUAL) 0.4 MG/SPRAY spray Place 1 spray under the tongue every 5  (five) minutes x 3 doses as needed for chest pain. 12 g 12  . rivaroxaban (XARELTO) 20 MG TABS tablet Take 1 tablet (20 mg total) by mouth daily with supper. 30 tablet 3  . spironolactone (ALDACTONE) 25 MG tablet Take 1 tablet (25 mg total) by mouth daily. (Patient not taking: Reported on 06/08/2015) 30 tablet 2   No current facility-administered medications for this encounter.    Filed Vitals:   06/25/15 1514  BP: 119/66  Pulse: 70  Height:  (1.727 m)  Weight: 170 lb 1.9 oz (77.166 kg)  SpO2: 99%    PHYSICAL EXAM: General:   No respiratory difficulty; GF present  HEENT: normal Neck: supple. JVP 5-6 cm. Carotids 2+ bilat; no bruits. No lymphadenopathy or thryomegaly appreciated. Cor: PMI nondisplaced. Regular rate & rhythm. No rubs, gallops or murmurs. Midline incision approximated and healed Lungs: prolonged expiratory phase.  Abdomen: soft, nontender, nondistended. No hepatosplenomegaly. No bruits or masses. Good bowel sounds. Extremities: no cyanosis, clubbing, rash, no edema Neuro: alert & oriented x 3, cranial nerves grossly intact. moves all 4 extremities w/o difficulty. Affect pleasant.  ASSESSMENT & PLAN:  1) Chronic systolic HF: Echo (11/15) with EF 10-15%. Ischemic cardiomyopathy, not much improvement in LV function post-CABG.  Has Subcutaneous [employer] ICD.  NYHA class II-III symptoms. Volume status ok.   - Continue carvedilol 6.25 mg bid, spiro  daily and lisinopril 5 mg daily.  - Takes lasix as needed 2) Chest pain/CAD: s/p CABG x 3 in 1/14. - Recently admitted with CP. Troponin mildly elevated. Continues with intermittent CP at rest - I reviewed Myoview images personally and he has extensive scarring with mild peri-infarct ischemia - Both he and his wife are concerned about worsening CAD. We discussed options. They would like to pursue cath. Will plan R/L cath in am. We discussed risks. - Continue b-blocker, atorvastatin and ASA 81.  3) Mural  Thrombus: Noted again on most recent echo.  Not able to handle coumadin. Continue to Xarelto for thrombus and PAF.  4) OSA:  Sleep study not suggestive of OSA.  5) Hyperlipidemia: On atorvastatin 80 mg daily.  6) ETOH abuse: Has cut back 7) Current Smoker: Smoking 5-6 PPD. Encouraged to stop all together.   8) TIA- 05/2014. On Xarelto and statin. 9) PAF - This patients CHA2DS2-VASc Score and unadjusted Ischemic Stroke Rate (% per year) is equal to 4.8 % stroke rate/year from a score of 4  Unable to manage coumadin. Continue Xarelto.   Follow up 4 weeks.   Amy Clegg NP-C  06/25/2015   Patient seen and examined with Tonye Becket, NP. We discussed all aspects of the encounter. I agree with the assessment and plan as stated above.   I have edited the note above. I reviewed Myoview images personally and discussed risks of cath  in detail. We will proceed with cath tomorrow am.   Bensimhon, Daniel,MD 9:56 PM

## 2015-07-03 ENCOUNTER — Encounter: Payer: Self-pay | Admitting: *Deleted

## 2015-07-07 ENCOUNTER — Telehealth (HOSPITAL_COMMUNITY): Payer: Self-pay | Admitting: *Deleted

## 2015-07-07 NOTE — Telephone Encounter (Signed)
Cath approval # [id number]

## 2015-07-29 ENCOUNTER — Encounter (HOSPITAL_COMMUNITY): Payer: Medicare Other | Admitting: Internal Medicine

## 2015-07-29 ENCOUNTER — Ambulatory Visit (HOSPITAL_COMMUNITY): Payer: Medicare Other

## 2015-08-04 ENCOUNTER — Encounter: Payer: Self-pay | Admitting: Internal Medicine

## 2015-08-12 ENCOUNTER — Ambulatory Visit (HOSPITAL_COMMUNITY): Admission: RE | Admit: 2015-08-12 | Payer: Medicare Other | Source: Ambulatory Visit

## 2015-08-12 ENCOUNTER — Encounter (HOSPITAL_COMMUNITY): Payer: Medicare Other | Admitting: Internal Medicine

## 2015-08-24 ENCOUNTER — Encounter: Payer: Self-pay | Admitting: Internal Medicine

## 2015-09-02 ENCOUNTER — Encounter: Payer: Self-pay | Admitting: *Deleted

## 2015-09-10 ENCOUNTER — Ambulatory Visit (HOSPITAL_COMMUNITY): Admission: RE | Admit: 2015-09-10 | Payer: Medicare Other | Source: Ambulatory Visit

## 2015-09-10 ENCOUNTER — Encounter (HOSPITAL_COMMUNITY): Payer: Medicare Other | Admitting: Internal Medicine

## 2015-10-01 ENCOUNTER — Emergency Department: Payer: Medicare Other

## 2015-10-01 ENCOUNTER — Emergency Department
Admission: EM | Admit: 2015-10-01 | Discharge: 2015-10-01 | Discharge status: Against Medical Advice | Payer: Medicare Other | Attending: Emergency Medicine | Admitting: Emergency Medicine

## 2015-10-01 DIAGNOSIS — Z7982 Long term (current) use of aspirin: Secondary | ICD-10-CM | POA: Insufficient documentation

## 2015-10-01 DIAGNOSIS — Z9889 Other specified postprocedural states: Secondary | ICD-10-CM | POA: Insufficient documentation

## 2015-10-01 DIAGNOSIS — Z9861 Coronary angioplasty status: Secondary | ICD-10-CM | POA: Insufficient documentation

## 2015-10-01 DIAGNOSIS — R079 Chest pain, unspecified: Secondary | ICD-10-CM | POA: Diagnosis present

## 2015-10-01 DIAGNOSIS — Z7901 Long term (current) use of anticoagulants: Secondary | ICD-10-CM | POA: Diagnosis not present

## 2015-10-01 DIAGNOSIS — Z880 Allergy status to penicillin: Secondary | ICD-10-CM | POA: Diagnosis not present

## 2015-10-01 DIAGNOSIS — Z79899 Other long term (current) drug therapy: Secondary | ICD-10-CM | POA: Insufficient documentation

## 2015-10-01 DIAGNOSIS — F17210 Nicotine dependence, cigarettes, uncomplicated: Secondary | ICD-10-CM | POA: Insufficient documentation

## 2015-10-01 MED ORDER — ASPIRIN 81 MG PO CHEW: 324.0000 mg | CHEWABLE_TABLET | Freq: Once | ORAL | Status: DC

## 2015-10-01 NOTE — ED Notes (Signed)
Pt refused Tx, states "nothing is wrong with me." Pt made aware of risks of leaving without being evaluated. Informed to return if his symptoms worsen. Pt verbalize understanding.
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
## 2015-10-01 NOTE — ED Notes (Signed)
Before triage completed pt decided that he was going to leave and started pulling off medical equipment with Dr Manson Passey at bedside

## 2015-10-01 NOTE — ED Notes (Signed)
Per EMS pt c/o chest pain with shortness of breath and dizziness

## 2015-10-09 ENCOUNTER — Ambulatory Visit (INDEPENDENT_AMBULATORY_CARE_PROVIDER_SITE_OTHER): Payer: Medicare Other | Admitting: *Deleted

## 2015-10-09 DIAGNOSIS — I255 Ischemic cardiomyopathy: Secondary | ICD-10-CM

## 2015-10-12 NOTE — Progress Notes (Signed)
Remote ICD transmission.   

## 2015-11-26 ENCOUNTER — Encounter: Payer: Self-pay | Admitting: Cardiology

## 2015-11-26 LAB — CUP PACEART REMOTE DEVICE CHECK
Date Time Interrogation Session: 20170407143600
Implantable Lead Implant Date: 20150925
Implantable Lead Location: 753862
MDC IDC LEAD MODEL: 3010
MDC IDC MSMT BATTERY REMAINING PERCENTAGE: 80 %
MDC IDC PG SERIAL: 105963

## 2015-12-21 ENCOUNTER — Telehealth (HOSPITAL_COMMUNITY): Payer: Self-pay | Admitting: Vascular Surgery

## 2015-12-21 ENCOUNTER — Encounter (HOSPITAL_COMMUNITY): Payer: Self-pay

## 2015-12-21 NOTE — Progress Notes (Signed)
Patient dropped off social security forms to be completed by CHF office for disability claim. These papers are to be filled out by patient (no provider section or signatures required), however, did fill out dates of previous apts, admissions, and procedures. Patient was last seen in CHF clinic in 06/2015 and was due to come back in 4 weeks, but has no showed and cancelled multiple appointments. In trying to contact patient to pick up this paperwork and schedule him for follow up with Dr. Gala Romney and echocardiogram, his number listed in chart has been disconnected. Emergency contact (sister) states she has not seen him in awhile bu will relay the message that he has paperwork to pick up, appointment to make with [place], and needs to call us to update his contact info. Paperwork copied and scanned into patient's electronic medical records. Last OV note, labs, and cardiac cath report printed and attached to original paperwork for patient pickup. Will file original paperwork in CHF disability/forms completion folder in CHF clinic for pending pickup by patient.  Ave Filter

## 2015-12-21 NOTE — Telephone Encounter (Signed)
Called # in chart, pt sister answered she does not know his phone # or where he is living, she said if she sees him or talk to pt she will tell him to give [place] a call

## 2015-12-23 ENCOUNTER — Telehealth (HOSPITAL_COMMUNITY): Payer: Self-pay | Admitting: Vascular Surgery

## 2015-12-23 ENCOUNTER — Encounter (HOSPITAL_COMMUNITY): Payer: Self-pay | Admitting: Vascular Surgery

## 2015-12-23 NOTE — Telephone Encounter (Signed)
Pt does not have vm set up, will send a letter to make f/u appt w/ echo

## 2015-12-23 NOTE — Telephone Encounter (Signed)
Patient called to ask for social security paperwork to be mailed back to him since areas were not completed by him. Will make copy of paperwork and mail to address provided by patient [id number]  [place] [address]  Ave [place]

## 2016-01-06 ENCOUNTER — Observation Stay (HOSPITAL_COMMUNITY)
Admission: EM | Admit: 2016-01-06 | Discharge: 2016-01-07 | Disposition: A | Discharge status: Home / Self Care | Payer: Medicare Other | Attending: Internal Medicine | Admitting: Internal Medicine

## 2016-01-06 ENCOUNTER — Encounter (HOSPITAL_COMMUNITY): Payer: Self-pay | Admitting: Emergency Medicine

## 2016-01-06 ENCOUNTER — Emergency Department (HOSPITAL_COMMUNITY): Payer: Medicare Other

## 2016-01-06 DIAGNOSIS — Z79899 Other long term (current) drug therapy: Secondary | ICD-10-CM | POA: Diagnosis not present

## 2016-01-06 DIAGNOSIS — I213 ST elevation (STEMI) myocardial infarction of unspecified site: Secondary | ICD-10-CM | POA: Diagnosis not present

## 2016-01-06 DIAGNOSIS — Z7901 Long term (current) use of anticoagulants: Secondary | ICD-10-CM | POA: Diagnosis not present

## 2016-01-06 DIAGNOSIS — I509 Heart failure, unspecified: Secondary | ICD-10-CM

## 2016-01-06 DIAGNOSIS — R202 Paresthesia of skin: Secondary | ICD-10-CM | POA: Insufficient documentation

## 2016-01-06 DIAGNOSIS — R531 Weakness: Secondary | ICD-10-CM | POA: Insufficient documentation

## 2016-01-06 DIAGNOSIS — I110 Hypertensive heart disease with heart failure: Secondary | ICD-10-CM | POA: Insufficient documentation

## 2016-01-06 DIAGNOSIS — H538 Other visual disturbances: Secondary | ICD-10-CM | POA: Insufficient documentation

## 2016-01-06 DIAGNOSIS — R4781 Slurred speech: Secondary | ICD-10-CM | POA: Insufficient documentation

## 2016-01-06 DIAGNOSIS — Z880 Allergy status to penicillin: Secondary | ICD-10-CM | POA: Insufficient documentation

## 2016-01-06 DIAGNOSIS — Z8249 Family history of ischemic heart disease and other diseases of the circulatory system: Secondary | ICD-10-CM | POA: Insufficient documentation

## 2016-01-06 DIAGNOSIS — Z7982 Long term (current) use of aspirin: Secondary | ICD-10-CM | POA: Insufficient documentation

## 2016-01-06 DIAGNOSIS — I513 Intracardiac thrombosis, not elsewhere classified: Secondary | ICD-10-CM | POA: Insufficient documentation

## 2016-01-06 DIAGNOSIS — I639 Cerebral infarction, unspecified: Secondary | ICD-10-CM

## 2016-01-06 DIAGNOSIS — I2510 Atherosclerotic heart disease of native coronary artery without angina pectoris: Secondary | ICD-10-CM | POA: Insufficient documentation

## 2016-01-06 DIAGNOSIS — E785 Hyperlipidemia, unspecified: Secondary | ICD-10-CM | POA: Diagnosis not present

## 2016-01-06 DIAGNOSIS — I5022 Chronic systolic (congestive) heart failure: Secondary | ICD-10-CM | POA: Diagnosis not present

## 2016-01-06 DIAGNOSIS — I340 Nonrheumatic mitral (valve) insufficiency: Secondary | ICD-10-CM | POA: Diagnosis not present

## 2016-01-06 DIAGNOSIS — R2990 Unspecified symptoms and signs involving the nervous system: Secondary | ICD-10-CM | POA: Diagnosis present

## 2016-01-06 DIAGNOSIS — I252 Old myocardial infarction: Secondary | ICD-10-CM | POA: Insufficient documentation

## 2016-01-06 DIAGNOSIS — Z720 Tobacco use: Secondary | ICD-10-CM | POA: Diagnosis present

## 2016-01-06 DIAGNOSIS — Z955 Presence of coronary angioplasty implant and graft: Secondary | ICD-10-CM | POA: Insufficient documentation

## 2016-01-06 DIAGNOSIS — R200 Anesthesia of skin: Secondary | ICD-10-CM

## 2016-01-06 DIAGNOSIS — Z8673 Personal history of transient ischemic attack (TIA), and cerebral infarction without residual deficits: Secondary | ICD-10-CM | POA: Insufficient documentation

## 2016-01-06 DIAGNOSIS — F17200 Nicotine dependence, unspecified, uncomplicated: Secondary | ICD-10-CM | POA: Insufficient documentation

## 2016-01-06 DIAGNOSIS — G459 Transient cerebral ischemic attack, unspecified: Secondary | ICD-10-CM | POA: Diagnosis not present

## 2016-01-06 DIAGNOSIS — I25118 Atherosclerotic heart disease of native coronary artery with other forms of angina pectoris: Secondary | ICD-10-CM

## 2016-01-06 DIAGNOSIS — I480 Paroxysmal atrial fibrillation: Secondary | ICD-10-CM | POA: Diagnosis not present

## 2016-01-06 DIAGNOSIS — K219 Gastro-esophageal reflux disease without esophagitis: Secondary | ICD-10-CM | POA: Insufficient documentation

## 2016-01-06 LAB — URINALYSIS, ROUTINE W REFLEX MICROSCOPIC
BILIRUBIN URINE: NEGATIVE
Glucose, UA: NEGATIVE mg/dL
KETONES UR: NEGATIVE mg/dL
Leukocytes, UA: NEGATIVE
NITRITE: NEGATIVE
PH: 6 (ref 5.0–8.0)
Protein, ur: NEGATIVE mg/dL
Specific Gravity, Urine: 1.022 (ref 1.005–1.030)

## 2016-01-06 LAB — RAPID URINE DRUG SCREEN, HOSP PERFORMED
AMPHETAMINES: NOT DETECTED
Barbiturates: NOT DETECTED
Benzodiazepines: NOT DETECTED
Cocaine: NOT DETECTED
OPIATES: NOT DETECTED
TETRAHYDROCANNABINOL: NOT DETECTED

## 2016-01-06 LAB — CBC
HCT: 40.7 % (ref 39.0–52.0)
Hemoglobin: 14.3 g/dL (ref 13.0–17.0)
MCH: 34.2 pg — ABNORMAL HIGH (ref 26.0–34.0)
MCHC: 35.1 g/dL (ref 30.0–36.0)
MCV: 97.4 fL (ref 78.0–100.0)
PLATELETS: 287 10*3/uL (ref 150–400)
RBC: 4.18 MIL/uL — AB (ref 4.22–5.81)
RDW: 12.2 % (ref 11.5–15.5)
WBC: 8.4 10*3/uL (ref 4.0–10.5)

## 2016-01-06 LAB — COMPREHENSIVE METABOLIC PANEL
ALBUMIN: 3.8 g/dL (ref 3.5–5.0)
ALK PHOS: 91 U/L (ref 38–126)
ALT: 27 U/L (ref 17–63)
AST: 34 U/L (ref 15–41)
Anion gap: 7 (ref 5–15)
BUN: 9 mg/dL (ref 6–20)
CALCIUM: 9.4 mg/dL (ref 8.9–10.3)
CO2: 24 mmol/L (ref 22–32)
CREATININE: 0.91 mg/dL (ref 0.61–1.24)
Chloride: 108 mmol/L (ref 101–111)
GFR calc Af Amer: >60 mL/min (ref >60)
GFR calc non Af Amer: >60 mL/min (ref >60)
GLUCOSE: 104 mg/dL — AB (ref 65–99)
Potassium: 4 mmol/L (ref 3.5–5.1)
SODIUM: 139 mmol/L (ref 135–145)
Total Bilirubin: 0.9 mg/dL (ref 0.3–1.2)
Total Protein: 6.4 g/dL — ABNORMAL LOW (ref 6.5–8.1)

## 2016-01-06 LAB — I-STAT CHEM 8, ED
BUN: 12 mg/dL (ref 6–20)
CALCIUM ION: 1.21 mmol/L (ref 1.13–1.30)
CHLORIDE: 106 mmol/L (ref 101–111)
Creatinine, Ser: 0.9 mg/dL (ref 0.61–1.24)
Glucose, Bld: 101 mg/dL — ABNORMAL HIGH (ref 65–99)
HCT: 42 % (ref 39.0–52.0)
Hemoglobin: 14.3 g/dL (ref 13.0–17.0)
Potassium: 4 mmol/L (ref 3.5–5.1)
SODIUM: 142 mmol/L (ref 135–145)
TCO2: 25 mmol/L (ref 0–100)

## 2016-01-06 LAB — CBG MONITORING, ED: Glucose-Capillary: 122 mg/dL — ABNORMAL HIGH (ref 65–99)

## 2016-01-06 LAB — URINE MICROSCOPIC-ADD ON: Squamous Epithelial / LPF: NONE SEEN

## 2016-01-06 LAB — DIFFERENTIAL
BASOS ABS: 0 10*3/uL (ref 0.0–0.1)
Basophils Relative: 0 %
Eosinophils Absolute: 0.1 10*3/uL (ref 0.0–0.7)
Eosinophils Relative: 2 %
LYMPHS PCT: 32 %
Lymphs Abs: 2.6 10*3/uL (ref 0.7–4.0)
Monocytes Absolute: 0.5 10*3/uL (ref 0.1–1.0)
Monocytes Relative: 6 %
NEUTROS ABS: 5.1 10*3/uL (ref 1.7–7.7)
NEUTROS PCT: 60 %

## 2016-01-06 LAB — I-STAT TROPONIN, ED: Troponin i, poc: 0.02 ng/mL (ref 0.00–0.08)

## 2016-01-06 LAB — APTT: aPTT: 29 seconds (ref 24–37)

## 2016-01-06 LAB — ETHANOL: Alcohol, Ethyl (B): <5 mg/dL (ref <5)

## 2016-01-06 LAB — PROTIME-INR
INR: 0.99 (ref 0.00–1.49)
PROTHROMBIN TIME: 13.3 s (ref 11.6–15.2)

## 2016-01-06 MED ORDER — ENOXAPARIN SODIUM 40 MG/0.4ML ~~LOC~~ SOLN: 40.0000 mg | SUBCUTANEOUS | Status: DC

## 2016-01-06 MED ORDER — CARVEDILOL 6.25 MG PO TABS
6.2500 mg | ORAL_TABLET | Freq: Two times a day (BID) | ORAL | Status: DC
  Administered 2016-01-07 (×2): 6.25 mg via ORAL
  Filled 2016-01-06 (×2): qty 1

## 2016-01-06 MED ORDER — ACETAMINOPHEN 650 MG RE SUPP: 650.0000 mg | RECTAL | Status: DC | PRN

## 2016-01-06 MED ORDER — STROKE: EARLY STAGES OF RECOVERY BOOK
Freq: Once | Status: AC
  Administered 2016-01-07: 07:00:00

## 2016-01-06 MED ORDER — ASPIRIN 325 MG PO TABS
325.0000 mg | ORAL_TABLET | Freq: Every day | ORAL | Status: DC
  Administered 2016-01-07: 325 mg via ORAL
  Filled 2016-01-06: qty 1

## 2016-01-06 MED ORDER — ASPIRIN EC 325 MG PO TBEC
325.0000 mg | DELAYED_RELEASE_TABLET | Freq: Once | ORAL | Status: AC
  Administered 2016-01-06: 325 mg via ORAL
  Filled 2016-01-06: qty 1

## 2016-01-06 MED ORDER — ACETAMINOPHEN 325 MG PO TABS: 650.0000 mg | ORAL_TABLET | ORAL | Status: DC | PRN

## 2016-01-06 MED ORDER — LISINOPRIL 5 MG PO TABS
5.0000 mg | ORAL_TABLET | Freq: Every day | ORAL | Status: DC
  Administered 2016-01-07: 5 mg via ORAL
  Filled 2016-01-06: qty 1

## 2016-01-06 MED ORDER — SPIRONOLACTONE 25 MG PO TABS
12.5000 mg | ORAL_TABLET | Freq: Every day | ORAL | Status: DC
  Administered 2016-01-07: 12.5 mg via ORAL
  Filled 2016-01-06: qty 1

## 2016-01-06 MED ORDER — ATORVASTATIN CALCIUM 80 MG PO TABS
80.0000 mg | ORAL_TABLET | Freq: Every day | ORAL | Status: DC
  Administered 2016-01-07: 80 mg via ORAL
  Filled 2016-01-06: qty 1

## 2016-01-06 MED ORDER — ASPIRIN 300 MG RE SUPP: 300.0000 mg | Freq: Every day | RECTAL | Status: DC

## 2016-01-06 MED ORDER — ASPIRIN 300 MG RE SUPP: 300.0000 mg | Freq: Once | RECTAL | Status: AC

## 2016-01-06 NOTE — Consult Note (Signed)
Neurology Consultation Reason for Consult: slurred speech Referring Physician: Patria Mane, K  CC: left arm numbness  History is obtained from:patient  HPI: Logan [name] is a 40 y.o. male with a history of severe CAD and CHF who has had what was called TIA in the past who presents with transient slurred speech and left arm numbness. It has mostly resolved at this point, but still with mild left hand numbness.   He states that he typically takes Xarelto, but has been out of it for about 2 weeks at this point.   LKW: 1850 tpa given?: no, rapidly improving mild symptoms NIHSS: 1   ROS: A 14 point ROS was performed and is negative except as noted in the HPI.   Past Medical History  Diagnosis Date  . GERD (gastroesophageal reflux disease)   . CAD (coronary artery disease) 01/22/12    a.anterolateral MI with DES to LAD EF 25% b. reinfarction 8/13 with thrombosis of stent c. s/p CABG x 3: LIMA-LAD, SVG-OM, SVG-RCA (07/2012)   . Hyperlipidemia   . Tobacco abuse   . Chronic systolic CHF (congestive heart failure) (HCC)     a. (07/2012) EF 20-25% diff HK with reg wall abnl, HK anterolat wall, akinesis inferosep, mod MR;  b. 01/2014 Echo: EF 10-15%, glob HK, mod MR.  . Noncompliance   . Syncope   . LV (left ventricular) mural thrombus (HCC)     "just found it in Oct" (07/09/2012)  . Ischemic cardiomyopathy     a.  01/2014 Echo: EF 10-15%, glob HK;  b. 03/2014 S/P Subcutaneous ICD.  Marland Kitchen Anginal pain (HCC)   . Moderate mitral regurgitation   . Myocardial infarction (HCC) 01/2012; 02/2012; 04/2012  . Stroke [facility]) 04/2014    TIA in the setting of medical non-compliance with coumadin requiring hospitalization.     Family History  Problem Relation Age of Onset  . Coronary artery disease Father     Premature CAD  . Heart disease Father   . Heart attack Father   . Coronary artery disease Brother     Premature CAD  . Heart attack Brother   . Heart disease Brother   . Hypertension Sister   . Heart  disease Mother      Social History:  reports that he has been smoking Cigarettes.  He started smoking about 29 years ago. He has a 25 pack-year smoking history. He has quit using smokeless tobacco. His smokeless tobacco use included Snuff. He reports that he drinks about 8.4 oz of alcohol per week. He reports that he uses illicit drugs (Marijuana).   Exam: Current vital signs: BP 123/74 mmHg  Pulse 75  Temp(Src) 98.9 F (37.2 C) (Oral)  Resp 18  SpO2 99% Vital signs in last 24 hours: Temp:  [98.9 F (37.2 C)] 98.9 F (37.2 C) (07/05 1900) Pulse Rate:  [75] 75 (07/05 1900) Resp:  [18] 18 (07/05 1900) BP: (123)/(74) 123/74 mmHg (07/05 1900) SpO2:  [99 %] 99 % (07/05 1900)   Physical Exam  Constitutional: Appears well-developed and well-nourished.  Psych: Affect appropriate to situation Eyes: No scleral injection HENT: No OP obstrucion Head: Normocephalic.  Cardiovascular: Normal rate and regular rhythm.  Respiratory: Effort normal and breath sounds normal to anterior ascultation GI: Soft.  No distension. There is no tenderness.  Skin: WDI  Neuro: Mental Status: Patient is awake, alert, oriented to person, place, month, year, and situation. Patient is able to give a clear and coherent history. No signs of aphasia  or neglect Cranial Nerves: II: Visual Fields are full. Pupils are equal, round, and reactive to light.   III,IV, VI: EOMI without ptosis or diploplia.  V: Facial sensation is symmetric to temperature VII: Facial movement is symmetric.  VIII: hearing is intact to voice X: Uvula elevates symmetrically XI: Shoulder shrug is symmetric. XII: tongue is midline without atrophy or fasciculations.  Motor: Tone is normal. Bulk is normal. 5/5 strength was present in all four extremities.  Sensory: Sensation is decreased in left arm Deep Tendon Reflexes: 2+ and symmetric in the biceps and patellae.  Plantars: Toes are downgoing bilaterally.  Cerebellar: FNF and  HKS are intact bilaterally  I have reviewed labs in epic and the results pertinent to this consultation are: BMP unremarkable  I have reviewed the images obtained: CT head-unremarkable  Impression: 40 year old male with history of cardiomyopathy who presents with TIA/possible stroke. He does have an ICD, but I have discussed with MRI and this does appear to be MRI safe. My suspicion is this represents cardioembolic source due to low EF.  Recommendations: 1. HgbA1c, fasting lipid panel 2. MRI, MRA  of the brain without contrast 3. Frequent neuro checks 4. Echocardiogram 5. Carotid dopplers 6. Prophylactic therapy-Antiplatelet med: Aspirin - dose  PO or  PR if no large area stroke on MRI, would restart anticoagulation 7. Risk factor modification 8. Telemetry monitoring 9. PT consult, OT consult, Speech consult 10. please page stroke NP  Or  PA  Or MD  M-F from 8am -4 pm starting 7/6 as this patient will be followed by the stroke team at this point.   You can look them up on [email or web address]      Ritta Slot, MD [facility] [phone]  If 7pm- 7am, please page neurology on call as listed in AMION.

## 2016-01-06 NOTE — ED Notes (Signed)
Called [facility] to activate Code Stroke
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
## 2016-01-06 NOTE — ED Notes (Addendum)
Pt. reported slurred speech/expressive aphasia onset approx. Marland Kitchen 6:50 pm this evening with mild left side body weakness/numbness. Code stroke activated , pt.seen by Dr. Patria Mane and transported to CT scan .
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
## 2016-01-06 NOTE — ED Notes (Signed)
Pt passed swallow screen. Pt states he does not feel the urge to urinate. Given water and a urinal.

## 2016-01-06 NOTE — Progress Notes (Signed)
Pt arrived around 2200 alert no pain will continue to monitor.

## 2016-01-07 ENCOUNTER — Observation Stay (HOSPITAL_COMMUNITY): Payer: Medicare Other

## 2016-01-07 ENCOUNTER — Encounter (HOSPITAL_COMMUNITY): Payer: Self-pay | Admitting: Radiology

## 2016-01-07 ENCOUNTER — Observation Stay (HOSPITAL_BASED_OUTPATIENT_CLINIC_OR_DEPARTMENT_OTHER): Payer: Medicare Other

## 2016-01-07 DIAGNOSIS — I213 ST elevation (STEMI) myocardial infarction of unspecified site: Secondary | ICD-10-CM

## 2016-01-07 DIAGNOSIS — Z7901 Long term (current) use of anticoagulants: Secondary | ICD-10-CM

## 2016-01-07 DIAGNOSIS — G459 Transient cerebral ischemic attack, unspecified: Secondary | ICD-10-CM | POA: Diagnosis not present

## 2016-01-07 DIAGNOSIS — I5022 Chronic systolic (congestive) heart failure: Secondary | ICD-10-CM

## 2016-01-07 DIAGNOSIS — G451 Carotid artery syndrome (hemispheric): Secondary | ICD-10-CM

## 2016-01-07 LAB — ECHOCARDIOGRAM COMPLETE
E decel time: 116 msec
E/e' ratio: 8.81
FS: 8 % — AB (ref 28–44)
IVS/LV PW RATIO, ED: 0.58
LA ID, A-P, ES: 33 mm
LA diam end sys: 33 mm
LA diam index: 1.73 cm/m2
LA vol A4C: 72.9 ml
LA vol: 67.6 mL
LAVOLIN: 35.4 mL/m2
LV TDI E'LATERAL: 9.36
LV TDI E'MEDIAL: 6.31
LVEEAVG: 8.81
LVEEMED: 8.81
LVELAT: 9.36 cm/s
LVOT VTI: 10.7 cm
LVOT area: 5.31 cm2
LVOTD: 26 mm
LVOTPV: 53.3 cm/s
LVOTSV: 57 mL
MV Dec: 116
MV pk A vel: 62.1 m/s
MV pk E vel: 82.5 m/s
MVPG: 3 mmHg
PISA EROA: 0.07 cm2
PW: 14.5 mm — AB (ref 0.6–1.1)
VTI: 173 cm

## 2016-01-07 LAB — LIPID PANEL
CHOL/HDL RATIO: 7.1 ratio
CHOLESTEROL: 228 mg/dL — AB (ref 0–200)
HDL: 32 mg/dL — ABNORMAL LOW (ref >40)
LDL CALC: 170 mg/dL — AB (ref 0–99)
TRIGLYCERIDES: 128 mg/dL (ref <150)
VLDL: 26 mg/dL (ref 0–40)

## 2016-01-07 LAB — GLUCOSE, CAPILLARY
GLUCOSE-CAPILLARY: 81 mg/dL (ref 65–99)
Glucose-Capillary: 83 mg/dL (ref 65–99)

## 2016-01-07 MED ORDER — RIVAROXABAN 20 MG PO TABS
20.0000 mg | ORAL_TABLET | ORAL | Status: AC
  Administered 2016-01-07: 20 mg via ORAL
  Filled 2016-01-07: qty 1

## 2016-01-07 MED ORDER — PERFLUTREN LIPID MICROSPHERE
1.0000 mL | INTRAVENOUS | Status: AC | PRN
  Administered 2016-01-07: 2 mL via INTRAVENOUS
  Filled 2016-01-07: qty 10

## 2016-01-07 MED ORDER — IOPAMIDOL (ISOVUE-370) INJECTION 76%
INTRAVENOUS | Status: AC
  Administered 2016-01-07: 50 mL
  Filled 2016-01-07: qty 50

## 2016-01-07 MED ORDER — PERFLUTREN LIPID MICROSPHERE
INTRAVENOUS | Status: AC
  Filled 2016-01-07: qty 10

## 2016-01-07 NOTE — Progress Notes (Signed)
STROKE TEAM PROGRESS NOTE   HISTORY OF PRESENT ILLNESS (per record) Logan [name] is a 40 y.o. male with a history of severe CAD and CHF who has had what was called TIA in the past who presents with transient slurred speech and left arm numbness. It has mostly resolved at this point, but still with mild left hand numbness. He states that he typically takes Xarelto, but has been out of it for about 2 weeks at this point. He was LKW 01/06/2016 at 1850. NIHSS: 1. Patient was not administered IV t-PA secondary to rapidly improving mild symptoms. He was admitted for further evaluation and treatment.   SUBJECTIVE (INTERVAL HISTORY) No famliy is at the bedside.  Overall he feels his condition is completely resolved. He reports his symptoms only lasted 10-15 mins. He did not get Xarelto recently because he could not afford. His wife plans to get filled in the am. He has follow up with cardiology next week.    OBJECTIVE Temp:  [97.9 F (36.6 C)-98.9 F (37.2 C)] 97.9 F (36.6 C) (07/06 0930) Pulse Rate:  [29-78] 59 (07/06 0930) Cardiac Rhythm:  [-] Sinus bradycardia (07/06 0705) Resp:  [11-18] 16 (07/06 0930) BP: (84-163)/(47-78) 107/63 mmHg (07/06 0930) SpO2:  [94 %-99 %] 99 % (07/06 0930)  CBC:   Recent Labs Lab 01/06/16 1920 01/06/16 1925  WBC 8.4  --   NEUTROABS 5.1  --   HGB 14.3 14.3  HCT 40.7 42.0  MCV 97.4  --   PLT 287  --     Basic Metabolic Panel:   Recent Labs Lab 01/06/16 1920 01/06/16 1925  NA 139 142  K 4.0 4.0  CL 108 106  CO2 24  --   GLUCOSE 104* 101*  BUN 9 12  CREATININE 0.91 0.90  CALCIUM 9.4  --     Lipid Panel:     Component Value Date/Time   CHOL 228* 01/07/2016 0528   TRIG 128 01/07/2016 0528   HDL 32* 01/07/2016 0528   CHOLHDL 7.1 01/07/2016 0528   VLDL 26 01/07/2016 0528   LDLCALC 170* 01/07/2016 0528   HgbA1c:  Lab Results  Component Value Date   HGBA1C 5.4 05/07/2014   Urine Drug Screen:     Component Value Date/Time   LABOPIA  NONE DETECTED 01/06/2016 2029   LABOPIA NEGATIVE 01/23/2012 1227   COCAINSCRNUR NONE DETECTED 01/06/2016 2029   COCAINSCRNUR NEGATIVE 01/23/2012 1227   LABBENZ NONE DETECTED 01/06/2016 2029   LABBENZ NEGATIVE 01/23/2012 1227   AMPHETMU NONE DETECTED 01/06/2016 2029   AMPHETMU NEGATIVE 01/23/2012 1227   THCU NONE DETECTED 01/06/2016 2029   LABBARB NONE DETECTED 01/06/2016 2029      IMAGING I have personally reviewed the radiological images below and agree with the radiology interpretations.  Ct Angio Head & Neck W Or Wo Contrast 01/07/2016   Unremarkable CTA head and neck. No flow reducing lesion or dissection is identified.   Dg Chest Port 1 View 01/07/2016  : Prior CABG.  Stable cardiomegaly.  No CHF. Electronically   Ct Head Code Stroke W/o Cm 01/06/2016  Normal   TTE - - Left ventricle: Inferior , posterior lateral septal and apical  akinesis The cavity size was severely dilated. Wall thickness was  normal. The estimated ejection fraction was 25%. Left ventricular  diastolic function parameters were normal. - Mitral valve: There was moderate regurgitation. - Atrial septum: No defect or patent foramen ovale was identified.  PHYSICAL EXAM  Temp:  [97.9 F (36.6  C)-98.9 F (37.2 C)] 97.9 F (36.6 C) (07/06 0930) Pulse Rate:  [29-78] 59 (07/06 0930) Resp:  [11-18] 16 (07/06 0930) BP: (84-163)/(47-78) 116/70 mmHg (07/06 1206) SpO2:  [94 %-99 %] 99 % (07/06 0930)  General - Well nourished, well developed, in no apparent distress.  Ophthalmologic - Sharp disc margins OU. F  Cardiovascular - Regular rate and rhythm.  Mental Status -  Level of arousal and orientation to time, place, and person were intact. Language including expression, naming, repetition, comprehension was assessed and found intact. Fund of Knowledge was assessed and was intact.  Cranial Nerves II - XII - II - Visual field intact OU. III, IV, VI - Extraocular movements intact. V - Facial sensation  intact bilaterally. VII - Facial movement intact bilaterally. VIII - Hearing & vestibular intact bilaterally. X - Palate elevates symmetrically. XI - Chin turning & shoulder shrug intact bilaterally. XII - Tongue protrusion intact.  Motor Strength - The patient's strength was normal in all extremities and pronator drift was absent.  Bulk was normal and fasciculations were absent.   Motor Tone - Muscle tone was assessed at the neck and appendages and was normal.  Reflexes - The patient's reflexes were 1+ in all extremities and he had no pathological reflexes.  Sensory - Light touch, temperature/pinprick, vibration and proprioception, and Romberg testing were assessed and were symmetrical.    Coordination - The patient had normal movements in the hands and feet with no ataxia or dysmetria.  Tremor was absent.  Gait and Station - The patient's transfers, posture, gait, station, and turns were observed as normal.   ASSESSMENT/PLAN [name] is a 40 y.o. male with history of severe CAD and CHF w/ hx TIA presenting with transient slurred speech and L arm numbnes. He did not receive IV t-PA due to rapidly improving mild symptoms.   Probable R brain TIA  Resultant  Neuro deficits resolved  Repeat CT head no acute infarct seen  MRI not able to perform due to pacer  CTA head and neck Unremarkable   2D Echo  EF 25%, no LV thrombus reported  LDL 170  HgbA1c pending  SCDs ordered for VTE prophylaxis  Diet Heart Room service appropriate?: Yes; Fluid consistency:: Thin   Prescribed aspirin 81 mg daily and Xarelto (rivaroxaban) daily but has not taken for 2 weeks prior to admission as he ran out/financial difficulties, now on aspirin 325 mg daily recommend resuming xarelto prior to discharge and continue Xarelto at home.  Patient counseled to be compliant with his antithrombotic medications  Ongoing aggressive stroke risk factor management  Therapy recommendations:  No  OT  Disposition:  Return home  Cardiomyopathy with low EF  Home anticoagulation:  Xarelto (rivaroxaban) daily until has not taken for 2 weeks prior to admission as he ran out/financial difficulties.   TTE EF 25%, improved form two years ago  Hx LV thrombus 2014, previously noncomplaint with coumadin, now on xarelto  Resume xarelto in hospital and continue at home  Followed by Dr. Gala Romney  Hypertension  Stable  Permissive hypertension (OK if < 220/120) but gradually normalize in 5-7 days  Long-term BP goal normotensive  Hyperlipidemia  Home meds:  lipitor 80, resumed in hospital  LDL 170, goal < 70  Continue statin at discharge  Tobacco abuse  Current smoker  Former smokeless tobacco user  Smoking cessation counseling provided  Pt is willing to quit  Other Stroke Risk Factors  ETOH use, advised to drink no more  than 2 drink(s) a day  Marijuana use  Hx TIA in the past per pt  Coronary artery disease s/p MI, CABG 2014  Other Active Problems  GERD  Noncompliance  s /p ICD  Hospital day # 1  Neurology will sign off. Please call with questions. Pt will follow up with carolyn Daphine Deutscher NP at [facility] in about 2 months. Thanks for the consult.  Marvel Plan, MD PhD Stroke Neurology 01/07/2016 12:27 PM      To contact Stroke Continuity provider, please refer to [email or web address]. After hours, contact General Neurology

## 2016-01-07 NOTE — Progress Notes (Deleted)
Patient walked off unit without notifying RN or anyone after being instructed not to leave unit per ad lib order. Pt returned to unit and asked for AMA papers. MD notified via amion and charge nurse Tracie, RN notified. Papers provided to patient and patient informed would not receive prescriptions or discharge papers. Pt signed AMA papers. Pt also removed IV on his own.  

## 2016-01-07 NOTE — Care Management Obs Status (Signed)
MEDICARE OBSERVATION STATUS NOTIFICATION   Patient Details  Name: Logan [name] MRN: [id number] Date of Birth: [birth year]   Medicare Observation Status Notification Given:  Yes (MRI negative)    Kermit Balo, RN 01/07/2016, 2:50 PM
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
## 2016-01-07 NOTE — Progress Notes (Signed)
Pt being discharged from hospital per orders from MD. Pt educated on discharge instructions. Pt verbalized understanding of instructions. All questions and concerns were addressed. Pt's IV was removed before discharge. Pt exited hospital via ambulation with family.

## 2016-01-07 NOTE — Discharge Instructions (Signed)
Transient Ischemic Attack °A transient ischemic attack (TIA) is a "warning stroke" that causes stroke-like symptoms. Unlike a stroke, a TIA does not cause permanent damage to the brain. The symptoms of a TIA can happen very fast and do not last long. It is important to know the symptoms of a TIA and what to do. This can help prevent a major stroke or death. °CAUSES  °A TIA is caused by a temporary blockage in an artery in the brain or neck (carotid artery). The blockage does not allow the brain to get the blood supply it needs and can cause different symptoms. The blockage can be caused by either: °· A blood clot. °· Fatty buildup (plaque) in a neck or brain artery. °RISK FACTORS °· High blood pressure (hypertension). °· High cholesterol. °· Diabetes mellitus. °· Heart disease. °· The buildup of plaque in the blood vessels (peripheral artery disease or atherosclerosis). °· The buildup of plaque in the blood vessels that provide blood and oxygen to the brain (carotid artery stenosis). °· An abnormal heart rhythm (atrial fibrillation). °· Obesity. °· Using any tobacco products, including cigarettes, chewing tobacco, or electronic cigarettes. °· Taking oral contraceptives, especially in combination with using tobacco. °· Physical inactivity. °· A diet high in fats, salt (sodium), and calories. °· Excessive alcohol use. °· Use of illegal drugs (especially cocaine and methamphetamine). °· Being male. °· Being African American. °· Being over the age of 55 years. °· Family history of stroke. °· Previous history of blood clots, stroke, TIA, or heart attack. °· Sickle cell disease. °SIGNS AND SYMPTOMS  °TIA symptoms are the same as a stroke but are temporary. These symptoms usually develop suddenly, or may be newly present upon waking from sleep: °· Sudden weakness or numbness of the face, arm, or leg, especially on one side of the body. °· Sudden trouble walking or difficulty moving arms or legs. °· Sudden  confusion. °· Sudden personality changes. °· Trouble speaking (aphasia) or understanding. °· Difficulty swallowing. °· Sudden trouble seeing in one or both eyes. °· Double vision. °· Dizziness. °· Loss of balance or coordination. °· Sudden severe headache with no known cause. °· Trouble reading or writing. °· Loss of bowel or bladder control. °· Loss of consciousness. °DIAGNOSIS  °Your health care provider may be able to determine the presence or absence of a TIA based on your symptoms, history, and physical exam. CT scan of the brain is usually performed to help identify a TIA. Other tests may include: °· Electrocardiography (ECG). °· Continuous heart monitoring. °· Echocardiography. °· Carotid ultrasonography. °· MRI. °· A scan of the brain circulation. °· Blood tests. °TREATMENT  °Since the symptoms of TIA are the same as a stroke, it is important to seek treatment as soon as possible. You may need a medicine to dissolve a blood clot (thrombolytic) if that is the cause of the TIA. This medicine cannot be given if too much time has passed. Treatment may also include:  °· Rest, oxygen, fluids through an IV tube, and medicines to thin the blood (anticoagulants). °· Measures will be taken to prevent short-term and long-term complications, including infection from breathing foreign material into the lungs (aspiration pneumonia), blood clots in the legs, and falls. °· Procedures to either remove plaque in the carotid arteries or dilate carotid arteries that have narrowed due to plaque. Those procedures are: °¨ Carotid endarterectomy. °¨ Carotid angioplasty and stenting. °· Medicines and diet may be used to address diabetes, high blood pressure, and   other underlying risk factors. °HOME CARE INSTRUCTIONS  °· Take medicines only as directed by your health care provider. Follow the directions carefully. Medicines may be used to control risk factors for a stroke. Be sure you understand all your medicine instructions. °· You  may be told to take aspirin or the anticoagulant warfarin. Warfarin needs to be taken exactly as instructed. °¨ Taking too much or too little warfarin is dangerous. Too much warfarin increases the risk of bleeding. Too little warfarin continues to allow the risk for blood clots. While taking warfarin, you will need to have regular blood tests to measure your blood clotting time. A PT blood test measures how long it takes for blood to clot. Your PT is used to calculate another value called an INR. Your PT and INR help your health care provider to adjust your dose of warfarin. The dose can change for many reasons. It is critically important that you take warfarin exactly as prescribed. °¨ Many foods, especially foods high in vitamin K can interfere with warfarin and affect the PT and INR. Foods high in vitamin K include spinach, kale, broccoli, cabbage, collard and turnip greens, Brussels sprouts, peas, cauliflower, seaweed, and parsley, as well as beef and pork liver, green tea, and soybean oil. You should eat a consistent amount of foods high in vitamin K. Avoid major changes in your diet, or notify your health care provider before changing your diet. Arrange a visit with a dietitian to answer your questions. °¨ Many medicines can interfere with warfarin and affect the PT and INR. You must tell your health care provider about any and all medicines you take; this includes all vitamins and supplements. Be especially cautious with aspirin and anti-inflammatory medicines. Do not take or discontinue any prescribed or over-the-counter medicine except on the advice of your health care provider or pharmacist. °¨ Warfarin can have side effects, such as excessive bruising or bleeding. You will need to hold pressure over cuts for longer than usual. Your health care provider or pharmacist will discuss other potential side effects. °¨ Avoid sports or activities that may cause injury or bleeding. °¨ Be careful when shaving,  flossing your teeth, or handling sharp objects. °¨ Alcohol can change the body's ability to handle warfarin. It is best to avoid alcoholic drinks or consume only very small amounts while taking warfarin. Notify your health care provider if you change your alcohol intake. °¨ Notify your dentist or other health care providers before procedures. °· Eat a diet that includes 5 or more servings of fruits and vegetables each day. This may reduce the risk of stroke. Certain diets may be prescribed to address high blood pressure, high cholesterol, diabetes, or obesity. °¨ A diet low in sodium, saturated fat, trans fat, and cholesterol is recommended to manage high blood pressure. °¨ A diet low in saturated fat, trans fat, and cholesterol, and high in fiber may control cholesterol levels. °¨ A controlled-carbohydrate, controlled-sugar diet is recommended to manage diabetes. °¨ A reduced-calorie diet that is low in sodium, saturated fat, trans fat, and cholesterol is recommended to manage obesity. °· Maintain a healthy weight. °· Stay physically active. It is recommended that you get at least 30 minutes of activity on most or all days. °· Do not use any tobacco products, including cigarettes, chewing tobacco, or electronic cigarettes. If you need help quitting, ask your health care provider. °· Limit alcohol intake to no more than 1 drink per day for nonpregnant women and 2 drinks   per day for men. One drink equals 12 ounces of beer, 5 ounces of wine, or 1½ ounces of hard liquor. °· Do not abuse drugs. °· A safe home environment is important to reduce the risk of falls. Your health care provider may arrange for specialists to evaluate your home. Having grab bars in the bedroom and bathroom is often important. Your health care provider may arrange for equipment to be used at home, such as raised toilets and a seat for the shower. °· Follow all instructions for follow-up with your health care provider. This is very important.  This includes any referrals and lab tests. Proper follow-up can prevent a stroke or another TIA from occurring. °PREVENTION  °The risk of a TIA can be decreased by appropriately treating high blood pressure, high cholesterol, diabetes, heart disease, and obesity, and by quitting smoking, limiting alcohol, and staying physically active. °SEEK MEDICAL CARE IF: °· You have personality changes. °· You have difficulty swallowing. °· You are seeing double. °· You have dizziness. °· You have a fever. °SEEK IMMEDIATE MEDICAL CARE IF:  °Any of the following symptoms may represent a serious problem that is an emergency. Do not wait to see if the symptoms will go away. Get medical help right away. Call your local emergency services (911 in [place] Do not drive yourself to the hospital. °· You have sudden weakness or numbness of the face, arm, or leg, especially on one side of the body. °· You have sudden trouble walking or difficulty moving arms or legs. °· You have sudden confusion. °· You have trouble speaking (aphasia) or understanding. °· You have sudden trouble seeing in one or both eyes. °· You have a loss of balance or coordination. °· You have a sudden, severe headache with no known cause. °· You have new chest pain or an irregular heartbeat. °· You have a partial or total loss of consciousness. °MAKE SURE YOU:  °· Understand these instructions. °· Will watch your condition. °· Will get help right away if you are not doing well or get worse. °  °This information is not intended to replace advice given to you by your health care provider. Make sure you discuss any questions you have with your health care provider. °  °Document Released: 03/30/2005 Document Revised: 07/11/2014 Document Reviewed: 09/25/2013 °[employer] Interactive Patient Education ©2016 [employer] ° °

## 2016-01-07 NOTE — Progress Notes (Signed)
  Echocardiogram 2D Echocardiogram with Definity has been performed.  Logan [name] 01/07/2016, 11:28 AM

## 2016-01-07 NOTE — Discharge Summary (Addendum)
Physician Discharge Summary  Yuval Rubens ZOX:[id number] DOB: [birth year] DOA: 01/06/2016  PCP: No primary care provider on file.  Admit date: 01/06/2016 Discharge date: 01/07/2016  Recommendations for Outpatient Follow-up:  1. Follow up in stroke clinic per scheduled appt   Discharge Diagnoses:  Principal Problem:   TIA (transient ischemic attack) Active Problems:   Tobacco abuse   CAD (coronary artery disease)   Chronic systolic congestive heart failure (HCC)   LV (left ventricular) mural thrombus (HCC)   Long term (current) use of anticoagulants   Paroxysmal atrial fibrillation (HCC)    Discharge Condition: stable   Diet recommendation: as tolerated   History of present illness:  '40 y.o. male with a past medical history significant for advanced CAD for age, s/p CABG in 2013, isch CM with EF 10-15%, smoking, and hx of TIA who presents with slurred speech and left sided numbness, weakness. The patient was in his normal state of health until tonight around 6:30 PM, while he was cooking dinner, he started to notice numbness and tingling on his left side and blurry vision. He walked into the house to tell his girlfriend, and she describes that he was only able to "speak like toddler" and "it was all garbled", and he complained of LEFT-sided "drooping", so she immediately brought him to the ER.  ED course: Symptoms started to resolve already, CODE STROKE canceled. Afebrile, hemodynamically stable, saturating well on room air. Sodium 139, potassium 4.0, creatinine 0.9, WBC 8.4K, hemoglobin 14, coags normal, negative, troponin normal, UDS negative. CT head unremarkable. ECG showed sinus rhythm. He was evaluated by neurology who recommended MRI (he has a Environmental manager ICD, MRI compatible) tomorrow and stroke workup. Of note, the patient has been off Xarelto for the last week because of financial issues (his disability check and been canceled because of paperwork, and he could not afford  the co-pay, and he will not have money to procure supply until Friday when his wife gets paid). Also, he denies cocaine, crack, meth, but endorses 10-15 beers yesterday (about 5 22 and/or 40 oz beers), none today. The patient had similar presentation (unilateral weakness, numbness, slurred speech, vision change) and was admitted for TIA at [facility] in 2015, also in the context of having missed his anticoagulant for a period of time. "   Hospital Course:  Principal Problem:   TIA (transient ischemic attack) Stroke/ TIA work up initiated:  - Aspirin daily - CT head / angio - unremarkable  - 2D ECHO - EF 25% - LDL 170 - A1c pending, needs outpt follow up - LDL goal < 100. Patient on lipitor 80 mg daily  - Seen by stroke team, clear for discharge with anticoagulation with xarelto   Active Problems:   Tobacco abuse - Counseled on smoking cessation     CAD (coronary artery disease) native artery without angina pectoris  - No chest pain - Continue daily aspirin    Chronic systolic congestive heart failure (HCC) - S/P CABG - Compensated  - Continue carvedilol and lasix  - Continue aldactone     LV (left ventricular) mural thrombus (HCC) / Long term (current) use of anticoagulants - Continue xarelto    Paroxysmal atrial fibrillation (HCC) - CHADS vasc score 4 - On AC with xarelto - HR controlled with coreg    Dyslipidemia - Continue statin therapy     Essential hypertension - Continue carvedilol    Signed:  Manson Passey, MD  [facility] 01/07/2016, 1:57 PM  Pager #:  [phone]  Time spent in minutes: more than 30 minutes  Procedures:  ECHO EF 25%  Consultations:  Stroke team   Discharge Exam: Filed Vitals:   01/07/16 0930 01/07/16 1206  BP: 107/63 116/70  Pulse: 59   Temp: 97.9 F (36.6 C)   Resp: 16    Filed Vitals:   01/07/16 0400 01/07/16 0600 01/07/16 0930 01/07/16 1206  BP: 107/59 84/52 107/63 116/70  Pulse: 54 55 59   Temp:   97.9 F  (36.6 C)   TempSrc:   Oral   Resp:   16   SpO2: 94% 95% 99%     General: Pt is alert, follows commands appropriately, not in acute distress Cardiovascular: Regular rate and rhythm, S1/S2 +, no murmurs Respiratory: Clear to auscultation bilaterally, no wheezing, no crackles, no rhonchi Abdominal: Soft, non tender, non distended, bowel sounds +, no guarding Extremities: no edema, no cyanosis, pulses palpable bilaterally DP and PT Neuro:   General: Mental Status: Alert, oriented, thought content appropriate. Mild dysarthria without evidence of aphasia. Able to follow 3 step commands without difficulty. Cranial Nerves: II: Discs flat bilaterally; Visual fields grossly normal, pupils equal, round, reactive to light and accommodation III,IV, VI: ptosis not present, extra-ocular motions intact bilaterally V,VII: smile symmetric, facial light touch sensation normal bilaterally VIII: hearing normal bilaterally IX,X: uvula rises symmetrically XI: bilateral shoulder shrug XII: midline tongue extension without atrophy or fasciculations  Motor: Right :Upper extremity 5/5Left: Upper extremity 5/5 Lower extremity 5/5Lower extremity 5/5 Tone and bulk:normal tone throughout; no atrophy noted Sensory: Pinprick and light touch intact throughout, bilaterally Deep Tendon Reflexes:  Right: Upper Extremity Left: Upper extremity   biceps (C-5 to C-6) 2/4 biceps (C-5 to C-6) 2/4 tricep (C7) 2/4triceps (C7) 2/4 Brachioradialis (C6) 2/4Brachioradialis (C6) 2/4  Lower Extremity Lower Extremity  quadriceps (L-2 to L-4) 2/4 quadriceps (L-2 to L-4) 2/4 Achilles (S1) 2/4Achilles (S1) 2/4  Plantars: Right:  downgoingLeft: downgoing Cerebellar: normal finger-to-nose, normal heel-to-shin test    Discharge Instructions  Discharge Instructions    Ambulatory referral to Neurology    Complete by:  As directed   Follow up with Darrol Angel, NP, at [facility] in about 2 months. Thanks.     Call MD for:  difficulty breathing, headache or visual disturbances    Complete by:  As directed      Call MD for:  persistant dizziness or light-headedness    Complete by:  As directed      Call MD for:  persistant nausea and vomiting    Complete by:  As directed      Call MD for:  severe uncontrolled pain    Complete by:  As directed      Diet - low sodium heart healthy    Complete by:  As directed      Discharge instructions    Complete by:  As directed   Continue xarelto 20 mg daily on discharge.     Increase activity slowly    Complete by:  As directed             Medication List    TAKE these medications        aspirin EC 81 MG tablet  Take 1 tablet (81 mg total) by mouth daily.     atorvastatin 80 MG tablet  Commonly known as:  LIPITOR  TAKE ONE TABLET BY MOUTH ONCE DAILY AT  6:00  PM     carvedilol 6.25 MG tablet  Commonly known as:  COREG  Take 1 tablet (6.25 mg total) by mouth 2 (two) times daily with a meal.     furosemide 20 MG tablet  Commonly known as:  LASIX  Take 1 tablet (20 mg total) by mouth as needed.     lisinopril 5 MG tablet  Commonly known as:  PRINIVIL,ZESTRIL  Take 1 tablet (5 mg total) by mouth daily.     nitroGLYCERIN 0.4 MG/SPRAY spray  Commonly known as:  NITROLINGUAL  Place 1 spray under the tongue every 5 (five) minutes x 3 doses as needed for chest pain.     rivaroxaban 20 MG Tabs tablet  Commonly known as:  XARELTO  Take 1 tablet (20 mg total) by mouth daily with supper.     spironolactone 25 MG tablet  Commonly known as:  ALDACTONE  Take 0.5 tablets (12.5 mg total) by mouth daily.           Follow-up  Information    Follow up with Nilda Riggs, NP. Schedule an appointment as soon as possible for a visit in 2 months.   Specialty:  Family Medicine   Why:  stroke clinic   Contact information:   [address] [place] [address] [phone]        The results of significant diagnostics from this hospitalization (including imaging, microbiology, ancillary and laboratory) are listed below for reference.    Significant Diagnostic Studies: Ct Angio Head W Or Wo Contrast  01/07/2016  CLINICAL DATA:  Episodic slurred speech and LEFT hand numbness which occurred 01/06/2015. Symptoms have now resolved. Suspected cardioembolic source due to low ejection fraction. EXAM: CT ANGIOGRAPHY HEAD AND NECK TECHNIQUE: Multidetector CT imaging of the head and neck was performed using the standard protocol during bolus administration of intravenous contrast. Multiplanar CT image reconstructions and MIPs were obtained to evaluate the vascular anatomy. Carotid stenosis measurements (when applicable) are obtained utilizing NASCET criteria, using the distal internal carotid diameter as the denominator. CONTRAST:  Isovue 370, 50 mL. COMPARISON:  CT head 01/06/2016. FINDINGS: CT HEAD Calvarium and skull base: No fracture or destructive lesion. Mastoids and middle ears are grossly clear. Paranasal sinuses: Imaged portions are clear. Orbits: Negative. Brain: No evidence of acute abnormality, including acute infarct, hemorrhage, hydrocephalus, or mass lesion. CTA NECK Aortic arch: Standard branching. Imaged portion shows no evidence of aneurysm or dissection. No significant stenosis of the major arch vessel origins. Right carotid system: Minor atheromatous change at the bifurcation. No evidence of dissection, stenosis (50% or greater) or occlusion. Left carotid system: Minor atheromatous change at the bifurcation. No evidence of dissection, stenosis (50% or greater) or occlusion. Vertebral arteries:  Codominant. No evidence of dissection, stenosis (50% or greater) or occlusion. Nonvascular soft tissues: Prior CABG. Subcutaneous defibrillator. Shotty cervical lymph nodes, nonspecific. CTA HEAD Anterior circulation: No significant stenosis, proximal occlusion, aneurysm, or vascular malformation. Posterior circulation: No significant stenosis, proximal occlusion, aneurysm, or vascular malformation. Venous sinuses: As permitted by contrast timing, patent. Anatomic variants: None of significance. Delayed phase:   No abnormal intracranial enhancement. IMPRESSION: Unremarkable CTA head and neck. No flow reducing lesion or dissection is identified. Electronically Signed   By: Elsie Stain M.D.   On: 01/07/2016 09:46   Ct Angio Neck W Or Wo Contrast  01/07/2016  CLINICAL DATA:  Episodic slurred speech and LEFT hand numbness which occurred 01/06/2015. Symptoms have now resolved. Suspected cardioembolic source due to low ejection fraction. EXAM: CT ANGIOGRAPHY HEAD AND NECK TECHNIQUE: Multidetector CT imaging of the  head and neck was performed using the standard protocol during bolus administration of intravenous contrast. Multiplanar CT image reconstructions and MIPs were obtained to evaluate the vascular anatomy. Carotid stenosis measurements (when applicable) are obtained utilizing NASCET criteria, using the distal internal carotid diameter as the denominator. CONTRAST:  Isovue 370, 50 mL. COMPARISON:  CT head 01/06/2016. FINDINGS: CT HEAD Calvarium and skull base: No fracture or destructive lesion. Mastoids and middle ears are grossly clear. Paranasal sinuses: Imaged portions are clear. Orbits: Negative. Brain: No evidence of acute abnormality, including acute infarct, hemorrhage, hydrocephalus, or mass lesion. CTA NECK Aortic arch: Standard branching. Imaged portion shows no evidence of aneurysm or dissection. No significant stenosis of the major arch vessel origins. Right carotid system: Minor atheromatous change  at the bifurcation. No evidence of dissection, stenosis (50% or greater) or occlusion. Left carotid system: Minor atheromatous change at the bifurcation. No evidence of dissection, stenosis (50% or greater) or occlusion. Vertebral arteries: Codominant. No evidence of dissection, stenosis (50% or greater) or occlusion. Nonvascular soft tissues: Prior CABG. Subcutaneous defibrillator. Shotty cervical lymph nodes, nonspecific. CTA HEAD Anterior circulation: No significant stenosis, proximal occlusion, aneurysm, or vascular malformation. Posterior circulation: No significant stenosis, proximal occlusion, aneurysm, or vascular malformation. Venous sinuses: As permitted by contrast timing, patent. Anatomic variants: None of significance. Delayed phase:   No abnormal intracranial enhancement. IMPRESSION: Unremarkable CTA head and neck. No flow reducing lesion or dissection is identified. Electronically Signed   By: Elsie Stain M.D.   On: 01/07/2016 09:46   Dg Chest Port 1 View  01/07/2016  CLINICAL DATA:  CHF. EXAM: PORTABLE CHEST 1 VIEW COMPARISON:  06/08/2015. FINDINGS: Prior CABG. Lead again noted in stable position projected over the sternum. Stable cardiomegaly no pulmonary venous congestion. No focal infiltrate. IMPRESSION: Prior CABG.  Stable cardiomegaly.  No CHF. Electronically Signed   By: Maisie Fus  Register   On: 01/07/2016 10:10   Ct Head Code Stroke W/o Cm  01/06/2016  CLINICAL DATA:  Slurred speech.  Code stroke. EXAM: CT HEAD WITHOUT CONTRAST TECHNIQUE: Contiguous axial images were obtained from the base of the skull through the vertex without intravenous contrast. COMPARISON:  CT 05/06/2014 FINDINGS: Ventricle size is normal. Negative for acute or chronic infarction. Negative for hemorrhage or fluid collection. Negative for mass or edema. No shift of the midline structures. Calvarium is intact. IMPRESSION: Normal These results were called by telephone at the time of interpretation on 01/06/2016 at 7:16 pm  to Dr. Patria Mane, who verbally acknowledged these results. Electronically Signed   By: Marlan Palau M.D.   On: 01/06/2016 19:17    Microbiology: No results found for this or any previous visit (from the past 240 hour(s)).   Labs: Basic Metabolic Panel:  Recent Labs Lab 01/06/16 1920 01/06/16 1925  NA 139 142  K 4.0 4.0  CL 108 106  CO2 24  --   GLUCOSE 104* 101*  BUN 9 12  CREATININE 0.91 0.90  CALCIUM 9.4  --    Liver Function Tests:  Recent Labs Lab 01/06/16 1920  AST 34  ALT 27  ALKPHOS 91  BILITOT 0.9  PROT 6.4*  ALBUMIN 3.8   No results for input(s): LIPASE, AMYLASE in the last 168 hours. No results for input(s): AMMONIA in the last 168 hours. CBC:  Recent Labs Lab 01/06/16 1920 01/06/16 1925  WBC 8.4  --   NEUTROABS 5.1  --   HGB 14.3 14.3  HCT 40.7 42.0  MCV 97.4  --  PLT 287  --    Cardiac Enzymes: No results for input(s): CKTOTAL, CKMB, CKMBINDEX, TROPONINI in the last 168 hours. BNP: BNP (last 3 results)  Recent Labs  06/09/15 0624  BNP 110.8*    ProBNP (last 3 results) No results for input(s): PROBNP in the last 8760 hours.  CBG:  Recent Labs Lab 01/06/16 1913 01/07/16 0631 01/07/16 1124  GLUCAP 122* 81 83

## 2016-01-07 NOTE — Progress Notes (Signed)
Occupational Therapy Treatment and Discharge Patient Details Name: Logan [name] MRN: [id number] DOB: [birth year] Today's Date: 01/07/2016    History of present illness 40 y.o. male with a past medical history significant for advanced CAD for age, s/p CABG in 2013, isch CM with EF 10-15%, smoking, and hx of TIA who presents with slurred speech and left sided numbness, weakness. CT on 7/5 negative for acute or chronic infarction.    OT comments  Pt reports he was independent with ADLs and mobility PTA. Currently pt overall mod I to independent with ADLs and mobility. Pt able to perform higher level balance activities without unsteadiness or LOB. Pt reports he feels all symptoms have resolved with no residual UE weakness, sensation changes, or visual/speech deficits. Pt planning to d/c home with support from family. No further acute OT needs identified, signing off at this time. Please re-consult if needs change. Thank you for this referral.   Follow Up Recommendations  No OT follow up;Supervision - Intermittent    Equipment Recommendations  None recommended by OT    Recommendations for Other Services      Precautions / Restrictions Precautions Precautions: None Restrictions Weight Bearing Restrictions: No       Mobility Bed Mobility Overal bed mobility: Modified Independent             General bed mobility comments: HOB elevated, no use of bed rail.  Transfers Overall transfer level: Independent Equipment used: None             General transfer comment: No unsteadiness or LOB noted.    Balance Overall balance assessment: Modified Independent                                 ADL Overall ADL's : Modified independent                                       General ADL Comments: Pt able to perform higher level balance activities without LOB or unsteadiness.      Vision                     Perception     Praxis       Cognition   Behavior During Therapy: WFL for tasks assessed/performed Overall Cognitive Status: Within Functional Limits for tasks assessed                       Extremity/Trunk Assessment  Upper Extremity Assessment Upper Extremity Assessment: Overall WFL for tasks assessed   Lower Extremity Assessment Lower Extremity Assessment: Defer to PT evaluation   Cervical / Trunk Assessment Cervical / Trunk Assessment: Normal    Exercises     Shoulder Instructions       General Comments      Pertinent Vitals/ Pain       Pain Assessment: No/denies pain  Home Living Family/patient expects to be discharged to:: Private residence Living Arrangements: Spouse/significant other;Children Available Help at Discharge: Family;Available 24 hours/day Type of Home: Mobile home Home Access: Stairs to enter Entrance Stairs-Number of Steps: 3 Entrance Stairs-Rails: Right;Left;Can reach both Home Layout: One level     Bathroom Shower/Tub: Tub/shower unit Shower/tub characteristics: Engineer, building services: Standard     Home Equipment: None          Prior Functioning/Environment Level  of Independence: Independent            Frequency       Progress Toward Goals  OT Goals(current goals can now be found in the care plan section)     Acute Rehab OT Goals Patient Stated Goal: home today OT Goal Formulation: All assessment and education complete, DC therapy  Plan      Co-evaluation                 End of Session     Activity Tolerance Patient tolerated treatment well   Patient Left in bed;with call bell/phone within reach   Nurse Communication      Functional Assessment Tool Used: Clinical judgement Functional Limitation: Self care Self Care Current Status (X9147): 0 percent impaired, limited or restricted Self Care Goal Status (W2956): 0 percent impaired, limited or restricted Self Care Discharge Status [phone]): 0 percent impaired, limited or  restricted   Time: 0808-0820 OT Time Calculation (min): 12 min  Charges: OT G-codes **NOT FOR INPATIENT CLASS** Functional Assessment Tool Used: Clinical judgement Functional Limitation: Self care Self Care Current Status (M5784): 0 percent impaired, limited or restricted Self Care Goal Status (O9629): 0 percent impaired, limited or restricted Self Care Discharge Status (B2841): 0 percent impaired, limited or restricted OT General Charges $OT Visit: 1 Procedure OT Evaluation $OT Eval Low Complexity: 1 Procedure  Gaye Alken M.S., OTR/L Pager: [phone]  01/07/2016, 8:25 AM

## 2016-01-07 NOTE — Care Management Note (Signed)
Case Management Note  Patient Details  Name: Atley Neubert MRN: [id number] Date of Birth: 07-12-1975  Subjective/Objective:   Pt in to r/o TIA. He is from home with his girlfriend. Pt states he has had trouble affording his medications d/t lapse in his disability. Pt states the problem with his disability should be worked out and he should be receiving some income soon.                  Action/Plan: CM consulted to assist with patients medications. Pt has insurance. CM met with Mr Haisley and asked him about his medications especially the Xarelto. Pt states he has spoken to Dr Polo Riley office and they have arranged for his medications to be sent to his girlfriends pharmacy and have assisted with the cost of his medications. CM asked if he knew how much the medications were going to be when picked up tomorrow and he did not. He stated that his girlfriend was going to purchase the medications for him tomorrow after she gets paid. CM left him with their number in case any issues arise at the pharmacy. Pt also without a PCP listed. Pt states he has started going to the [facility]. Pt states they will be his PCP. CM will updated the bedside RN.  Expected Discharge Date:  01/07/16               Expected Discharge Plan:  Home/Self Care  In-House Referral:     Discharge planning Services  CM Consult  Post Acute Care Choice:    Choice offered to:     DME Arranged:    DME Agency:     HH Arranged:    HH Agency:     Status of Service:  Completed, signed off  If discussed at [employer] of Stay Meetings, dates discussed:    Additional Comments:  Pollie Friar, RN 01/07/2016, 2:07 PM

## 2016-01-08 ENCOUNTER — Telehealth (HOSPITAL_COMMUNITY): Payer: Self-pay | Admitting: Cardiology

## 2016-01-08 LAB — HEMOGLOBIN A1C
Hgb A1c MFr Bld: 5.3 % (ref 4.8–5.6)
Mean Plasma Glucose: 105 mg/dL

## 2016-01-08 NOTE — Telephone Encounter (Signed)
Pt stated he was in the hospital recently and an echocardiogram was done Pt is scheduled for a routine follow up with echo an hour prior to appt 7/12 Echo appt cancelled and advised he was ok to just show up for appt

## 2016-01-11 ENCOUNTER — Telehealth (HOSPITAL_COMMUNITY): Payer: Self-pay

## 2016-01-11 MED ORDER — ATORVASTATIN CALCIUM 80 MG PO TABS: ORAL_TABLET | ORAL | Status: DC

## 2016-01-11 NOTE — Telephone Encounter (Signed)
Patient calling for refill on Lipitor 80 mg tablet to be sent to [facility]. Rx sent electronically to preferred pharmacy.  Ave Filter

## 2016-01-12 ENCOUNTER — Other Ambulatory Visit (HOSPITAL_COMMUNITY): Payer: Self-pay

## 2016-01-12 MED ORDER — ATORVASTATIN CALCIUM 80 MG PO TABS: ORAL_TABLET | ORAL | Status: DC

## 2016-01-13 ENCOUNTER — Ambulatory Visit (HOSPITAL_COMMUNITY)
Admission: RE | Admit: 2016-01-13 | Discharge: 2016-01-13 | Disposition: A | Discharge status: Home / Self Care | Payer: Medicare Other | Source: Ambulatory Visit | Attending: Cardiology | Admitting: Cardiology

## 2016-01-13 ENCOUNTER — Ambulatory Visit (HOSPITAL_COMMUNITY): Payer: Medicare Other

## 2016-01-13 ENCOUNTER — Encounter (HOSPITAL_COMMUNITY): Payer: Self-pay

## 2016-01-13 VITALS — BP 98/70 | HR 66 | Wt 161.4 lb

## 2016-01-13 DIAGNOSIS — I5022 Chronic systolic (congestive) heart failure: Secondary | ICD-10-CM | POA: Insufficient documentation

## 2016-01-13 DIAGNOSIS — I255 Ischemic cardiomyopathy: Secondary | ICD-10-CM | POA: Diagnosis not present

## 2016-01-13 DIAGNOSIS — G458 Other transient cerebral ischemic attacks and related syndromes: Secondary | ICD-10-CM | POA: Diagnosis not present

## 2016-01-13 DIAGNOSIS — Z95818 Presence of other cardiac implants and grafts: Secondary | ICD-10-CM | POA: Insufficient documentation

## 2016-01-13 DIAGNOSIS — I340 Nonrheumatic mitral (valve) insufficiency: Secondary | ICD-10-CM | POA: Diagnosis not present

## 2016-01-13 DIAGNOSIS — I2510 Atherosclerotic heart disease of native coronary artery without angina pectoris: Secondary | ICD-10-CM | POA: Insufficient documentation

## 2016-01-13 DIAGNOSIS — F1010 Alcohol abuse, uncomplicated: Secondary | ICD-10-CM

## 2016-01-13 DIAGNOSIS — F17210 Nicotine dependence, cigarettes, uncomplicated: Secondary | ICD-10-CM | POA: Insufficient documentation

## 2016-01-13 DIAGNOSIS — Z7901 Long term (current) use of anticoagulants: Secondary | ICD-10-CM | POA: Diagnosis not present

## 2016-01-13 DIAGNOSIS — R0602 Shortness of breath: Secondary | ICD-10-CM | POA: Diagnosis not present

## 2016-01-13 DIAGNOSIS — Z951 Presence of aortocoronary bypass graft: Secondary | ICD-10-CM

## 2016-01-13 DIAGNOSIS — I252 Old myocardial infarction: Secondary | ICD-10-CM | POA: Insufficient documentation

## 2016-01-13 DIAGNOSIS — Z8673 Personal history of transient ischemic attack (TIA), and cerebral infarction without residual deficits: Secondary | ICD-10-CM | POA: Diagnosis not present

## 2016-01-13 DIAGNOSIS — G4733 Obstructive sleep apnea (adult) (pediatric): Secondary | ICD-10-CM | POA: Diagnosis not present

## 2016-01-13 DIAGNOSIS — E785 Hyperlipidemia, unspecified: Secondary | ICD-10-CM | POA: Diagnosis not present

## 2016-01-13 DIAGNOSIS — Z7982 Long term (current) use of aspirin: Secondary | ICD-10-CM | POA: Diagnosis not present

## 2016-01-13 DIAGNOSIS — I480 Paroxysmal atrial fibrillation: Secondary | ICD-10-CM | POA: Diagnosis not present

## 2016-01-13 DIAGNOSIS — K219 Gastro-esophageal reflux disease without esophagitis: Secondary | ICD-10-CM | POA: Insufficient documentation

## 2016-01-13 DIAGNOSIS — I2582 Chronic total occlusion of coronary artery: Secondary | ICD-10-CM | POA: Insufficient documentation

## 2016-01-13 DIAGNOSIS — Z720 Tobacco use: Secondary | ICD-10-CM

## 2016-01-13 DIAGNOSIS — I25118 Atherosclerotic heart disease of native coronary artery with other forms of angina pectoris: Secondary | ICD-10-CM

## 2016-01-13 NOTE — Progress Notes (Signed)
Patient ID: Logan [name], male   DOB: 05-18-1976, 40 y.o.   MRN: [id number]   ADVANCED HF CLINIC NOTE  Patient ID: Logan [name], male   DOB: [birth year], 40 y.o.   MRN: [id number] PCP: None   HPI:  Logan [name] is a 40 year old male with CAD s/p CABG x 3; LIMA-LAD, SVG -obtuse marginal, SVG-RCA (07/2012), ischemic cardiomyopathy- Hx of anterolateral MI with DES to LAD; reinfarction 02/05/12 while in [place], [place] with thrombosis of the stent and repeat stenting. He also has a history of nicotine dependence, previous ETOH abuse, TIA 05/2014, PAF and non-compliance.  Admitted early December 2016 with CP. Had myoview extensive scar with minimal peri-infarct ischemia. Areas of scar correlated with findings on his cardiac MRI in 2014. Troponin remained mildly elevated but did not demonstrate typical rise and fall as expected with ACS - more consistent with significant cardiomyopathy. EF not  calculated. Later discharged home.   Admitted 7/5-7/6 with TIA symptoms in the setting of stopping Xarelto for several days (has had the same thing in the past). Improved spontaneously.  CT unremarkable.  No MRI with ICD (has a bostin Sci that IS MRI compatible, but radiology refused per patient). Needs to make follow up with stroke clinic.   He presents today for post hospital follow up.  Overall has been feeling OK, but has had some more SOB these past few days.  Can walk up to a mile at a careful pace without SOB. Denies CP. Occasional orthopnea/PND. Weight at home 165-166 at highest. Taking all medications. Smoking 1/2 ppd.  Trying to quit with nicotine gum. Has stopped drinking since he has been in the hospital.  (Had 15 beers the day prior to admission). On disability.  CPX Results 06/2012 Peak VO2: 21.9 ml/kg/min  predicted peak VO2: 52.9% VE/VCO2 slope: 33.7 OUES: 1.53 Peak RER: 1.31 Ventilatory Threshold: 17.4 % predicted peak VO2: 42%   L/RHC 06/2015  Prox RCA lesion, 100% stenosed.  Ost LM to LM  lesion, 20% stenosed.  2nd Mrg lesion, 100% stenosed.  SVG was injected is large.  The graft exhibits minimal luminal irregularities.  Mid LAD lesion, 40% stenosed.  Dist LAD lesion, 40% stenosed.  LIMA is small.  Insertion lesion, 70% stenosed.  SVG is very large.  The graft exhibits minimal luminal irregularities.   Findings:  RA = 2 RV = 27/05 PA = 19/2 (11) PCW = 6 Ao = 109/69 (86) LV = 115/8/20 Fick cardiac output/index = 3.9/2.1 SVR = 1719 PVR = 1.3 WU FA sat = 96% PA sat = 62%, 64%  Assessment: 1. Severe native 3v CAD with severe diffuse premature atherosclerosis 2. LIMA - LAD is very small due to competitive flow in native LAD. There is a bout a 70% narrowing at the insertion with the LAD 3. SVG - RPDA widely patent 4. SVR - OM2 widely patent 5. Normal hemodynamics with low filling pressures  L/RHC 07/2012 (prior to CABG) RA = 4  RV = 35/2/6  PA = 30/14 (20)  PCW = 15 v = 25  Fick cardiac output/index = 3.7/2.1  PVR = 1.4 Woods  FA sat = 97%  PA sat = 62%, 65%   Assessment:  1. 3 vessel disease with totally occluded RCA, severe disease in LCx, and 80% in-stent restenosis in LAD.  2. Low cardiac output, elevated filling pressures.   ECHO 04/2013: EF 20% with mural thrombus, grade I diastolic dysfunction, moderate RV dilation, moderate MR.  Limited ECHO (7/15): moderate LV dilation,  EF 10-15%, restrictive diastolic function, there was an apical thrombus noted, moderate functional MR, moderately decreased RV systolic function.   Labs (7/15): K 4.4, creatinine 0.87, HCT 42.6 Labs (8/15): LDL 158, HDL 56 Labs 05/2014: K 4.1 Creatinine 0.87   SH: Married, lives in [place], 2 kids, on disability, smokes 1 ppd, +ETOH  FH: Brother with MI at 36, grandmother MI at 8, mother SCD  ROS: All systems negative except as listed in HPI, PMH and Problem List.  Past Medical History  Diagnosis Date  . GERD (gastroesophageal reflux disease)   . CAD  (coronary artery disease) 01/22/12    a.anterolateral MI with DES to LAD EF 25% b. reinfarction 8/13 with thrombosis of stent c. s/p CABG x 3: LIMA-LAD, SVG-OM, SVG-RCA (07/2012)   . Hyperlipidemia   . Tobacco abuse   . Chronic systolic CHF (congestive heart failure) (HCC)     a. (07/2012) EF 20-25% diff HK with reg wall abnl, HK anterolat wall, akinesis inferosep, mod MR;  b. 01/2014 Echo: EF 10-15%, glob HK, mod MR.  . Noncompliance   . Syncope   . LV (left ventricular) mural thrombus (HCC)     "just found it in Oct" (07/09/2012)  . Ischemic cardiomyopathy     a.  01/2014 Echo: EF 10-15%, glob HK;  b. 03/2014 S/P Subcutaneous ICD.  Marland Kitchen Anginal pain (HCC)   . Moderate mitral regurgitation   . Myocardial infarction (HCC) 01/2012; 02/2012; 04/2012  . Stroke [facility]) 04/2014    TIA in the setting of medical non-compliance with coumadin requiring hospitalization.    Current Outpatient Prescriptions  Medication Sig Dispense Refill  . aspirin EC 81 MG tablet Take 1 tablet (81 mg total) by mouth daily. 30 tablet 6  . atorvastatin (LIPITOR) 80 MG tablet Take 80 mg by mouth daily.    . carvedilol (COREG) 6.25 MG tablet Take 1 tablet (6.25 mg total) by mouth 2 (two) times daily with a meal. 60 tablet 6  . lisinopril (PRINIVIL,ZESTRIL) 5 MG tablet Take 1 tablet (5 mg total) by mouth daily. 30 tablet 6  . rivaroxaban (XARELTO) 20 MG TABS tablet Take 1 tablet (20 mg total) by mouth daily with supper. 30 tablet 3  . spironolactone (ALDACTONE) 25 MG tablet Take 0.5 tablets (12.5 mg total) by mouth daily. 30 tablet 2  . furosemide (LASIX) 20 MG tablet Take 20 mg by mouth daily as needed (leg swelling or weight gain). Reported on 01/13/2016    . nitroGLYCERIN (NITROLINGUAL) 0.4 MG/SPRAY spray Place 1 spray under the tongue every 5 (five) minutes x 3 doses as needed for chest pain. (Patient not taking: Reported on 01/13/2016) 12 g 12   No current facility-administered medications for this encounter.    Filed Vitals:     01/13/16 1144  BP: 98/70  Pulse: 66  Weight: 161 lb 6.4 oz (73.211 kg)  SpO2: 99%    PHYSICAL EXAM: General:   No respiratory difficulty HEENT: normal Neck: supple. JVP 6-7 cm. Carotids 2+ bilat; no bruits. No thyromegaly or nodule appreciated. Cor: PMI nondisplaced. RRR. No M/G/R. Midline incision approximated and healed Lungs: prolonged expiratory phase.  Abdomen: soft, NT, ND, no HSM. No bruits or masses. +BS  Extremities: no cyanosis, clubbing, rash, no edema Neuro: alert & oriented x 3, cranial nerves grossly intact. moves all 4 extremities w/o difficulty. Affect pleasant.  ASSESSMENT & PLAN:  1) Chronic systolic HF: Echo (11/15) with EF 10-15%. Ischemic cardiomyopathy, not much improvement in LV function post-CABG.  Has Subcutaneous [employer] ICD.  NYHA class II-III symptoms. Volume status looks great.    - Continue carvedilol 6.25 mg bid, spiro 12.5mg  daily and lisinopril 5 mg daily. No room to up-titrate with soft pressures.  - Can continue lasix as needed, should take in next 2-3 days if he continues to have SOB (although by his description this is not of concern, with ability to walk up to a mile at pace) 2) Chest pain/CAD: s/p CABG x 3 in 1/14. - No further CP.  - LHC 06/2015 with severe native disease. Patent SVG (RPDA) and SVR (OM2).  LAD is very small due to competitive flow in native LAD. There is a bout a 70% narrowing at the insertion with the LAD - Continue b-blocker, atorvastatin 80 mg daily and ASA 81.  3) Mural Thrombus: Noted again on most recent echo.  Not able to handle coumadin. Continue to Xarelto for thrombus and PAF.  4) OSA:  Sleep study not suggestive of OSA.  5) Hyperlipidemia: On atorvastatin 80 mg daily.  6) ETOH abuse: - He states he has stopped since recent admission.  7) Current Smoker:  - Smoking greatly decreased down to 1/2 ppd. (down from 5-6 PPD in December).  - Encouraged to stop all together.   8) TIA- 05/2014 and 01/2016.  -  Continue Xarelto and statin.   - Is supposed to follow up with Stroke clinic. Urged him to make appointment.  9) PAF - This patients CHA2DS2-VASc Score and unadjusted Ischemic Stroke Rate (% per year) is equal to 4.8 % stroke rate/year from a score of 4  Continue Xarelto.   Follow up 2 months. Recent labs (last week) stable.   Mariam Dollar Rayhaan Huster PA-C  01/13/2016

## 2016-01-13 NOTE — Patient Instructions (Signed)
Follow up with Dr. Gala Romney in 2 months.  Refill for statin sent to [facility] pharmacy.  Do the following things EVERYDAY: 1) Weigh yourself in the morning before breakfast. Write it down and keep it in a log. 2) Take your medicines as prescribed 3) Eat low salt foods-Limit salt (sodium) to 2000 mg per day.  4) Stay as active as you can everyday 5) Limit all fluids for the day to less than 2 liters

## 2016-01-13 NOTE — Progress Notes (Signed)
Advanced Heart Failure Medication Review by a Pharmacist  Does the patient  feel that his/her medications are working for him/her?  yes  Has the patient been experiencing any side effects to the medications prescribed?  no  Does the patient measure his/her own blood pressure or blood glucose at home?  no   Does the patient have any problems obtaining medications due to transportation or finances?   no  Understanding of regimen: good Understanding of indications: good Potential of compliance: good Patient understands to avoid NSAIDs. Patient understands to avoid decongestants.  Issues to address at subsequent visits: Compliance   Pharmacist comments:  Mr. Serratore is a pleasant 40 yo M presenting with a recent hospital discharge medication list. He reports great compliance with his regimen since his most recent discharge and admits to being non-compliant prior to his last hospitalization. He states that he has not taken any lasix since his discharge and has not been weighing himself because the scale we gave him has not been working. I have provided him with a new scale and encouraged weighing daily. He also states that he does not have any cost issues with his medications and his Xarelto is much more affordable for him. He did not have any other medication-related questions or concerns for me at this time.   Tyler Deis. Bonnye Fava, PharmD, BCPS, CPP Clinical Pharmacist Pager: [phone] Phone: [phone] 01/13/2016 11:15 AM      Time with patient: 10 minutes Preparation and documentation time: 2 minutes Total time: 12 minutes

## 2016-02-25 ENCOUNTER — Other Ambulatory Visit (HOSPITAL_COMMUNITY): Payer: Self-pay | Admitting: Internal Medicine

## 2016-02-25 ENCOUNTER — Encounter (HOSPITAL_COMMUNITY): Payer: Self-pay | Admitting: Internal Medicine

## 2016-02-25 ENCOUNTER — Ambulatory Visit (HOSPITAL_COMMUNITY)
Admission: RE | Admit: 2016-02-25 | Discharge: 2016-02-25 | Disposition: A | Discharge status: Home / Self Care | Payer: Medicare Other | Source: Ambulatory Visit | Attending: Internal Medicine | Admitting: Internal Medicine

## 2016-02-25 VITALS — BP 112/72 | HR 70 | Ht 68.0 in | Wt 159.8 lb

## 2016-02-25 DIAGNOSIS — F17210 Nicotine dependence, cigarettes, uncomplicated: Secondary | ICD-10-CM | POA: Diagnosis not present

## 2016-02-25 DIAGNOSIS — Z8249 Family history of ischemic heart disease and other diseases of the circulatory system: Secondary | ICD-10-CM | POA: Insufficient documentation

## 2016-02-25 DIAGNOSIS — I480 Paroxysmal atrial fibrillation: Secondary | ICD-10-CM | POA: Diagnosis not present

## 2016-02-25 DIAGNOSIS — I255 Ischemic cardiomyopathy: Secondary | ICD-10-CM | POA: Diagnosis not present

## 2016-02-25 DIAGNOSIS — I2510 Atherosclerotic heart disease of native coronary artery without angina pectoris: Secondary | ICD-10-CM | POA: Diagnosis not present

## 2016-02-25 DIAGNOSIS — I25118 Atherosclerotic heart disease of native coronary artery with other forms of angina pectoris: Secondary | ICD-10-CM

## 2016-02-25 DIAGNOSIS — I739 Peripheral vascular disease, unspecified: Secondary | ICD-10-CM | POA: Diagnosis not present

## 2016-02-25 DIAGNOSIS — E785 Hyperlipidemia, unspecified: Secondary | ICD-10-CM | POA: Insufficient documentation

## 2016-02-25 DIAGNOSIS — Z7901 Long term (current) use of anticoagulants: Secondary | ICD-10-CM | POA: Diagnosis not present

## 2016-02-25 DIAGNOSIS — I5022 Chronic systolic (congestive) heart failure: Secondary | ICD-10-CM | POA: Diagnosis not present

## 2016-02-25 DIAGNOSIS — Z832 Family history of diseases of the blood and blood-forming organs and certain disorders involving the immune mechanism: Secondary | ICD-10-CM | POA: Insufficient documentation

## 2016-02-25 DIAGNOSIS — Z79899 Other long term (current) drug therapy: Secondary | ICD-10-CM | POA: Insufficient documentation

## 2016-02-25 DIAGNOSIS — Z95810 Presence of automatic (implantable) cardiac defibrillator: Secondary | ICD-10-CM | POA: Insufficient documentation

## 2016-02-25 DIAGNOSIS — Z951 Presence of aortocoronary bypass graft: Secondary | ICD-10-CM | POA: Diagnosis not present

## 2016-02-25 DIAGNOSIS — F1010 Alcohol abuse, uncomplicated: Secondary | ICD-10-CM | POA: Insufficient documentation

## 2016-02-25 DIAGNOSIS — K219 Gastro-esophageal reflux disease without esophagitis: Secondary | ICD-10-CM | POA: Insufficient documentation

## 2016-02-25 DIAGNOSIS — I252 Old myocardial infarction: Secondary | ICD-10-CM | POA: Diagnosis not present

## 2016-02-25 DIAGNOSIS — Z8673 Personal history of transient ischemic attack (TIA), and cerebral infarction without residual deficits: Secondary | ICD-10-CM | POA: Insufficient documentation

## 2016-02-25 DIAGNOSIS — Z7982 Long term (current) use of aspirin: Secondary | ICD-10-CM | POA: Diagnosis not present

## 2016-02-25 LAB — BASIC METABOLIC PANEL
Anion gap: 8 (ref 5–15)
BUN: 12 mg/dL (ref 6–20)
CALCIUM: 9.7 mg/dL (ref 8.9–10.3)
CO2: 23 mmol/L (ref 22–32)
CREATININE: 1.12 mg/dL (ref 0.61–1.24)
Chloride: 106 mmol/L (ref 101–111)
GFR calc Af Amer: >60 mL/min (ref >60)
GFR calc non Af Amer: >60 mL/min (ref >60)
GLUCOSE: 87 mg/dL (ref 65–99)
Potassium: 3.9 mmol/L (ref 3.5–5.1)
Sodium: 137 mmol/L (ref 135–145)

## 2016-02-25 MED ORDER — LISINOPRIL 5 MG PO TABS: 5.0000 mg | ORAL_TABLET | Freq: Two times a day (BID) | ORAL | 6 refills | Status: DC

## 2016-02-25 NOTE — Patient Instructions (Signed)
INCREASE Lisinopril to 5 mg , one tab twice a day  Labs today  Your physician has requested that you have an abdominal aorta duplex. During this test, an ultrasound is used to evaluate the aorta. Allow 30 minutes for this exam. Do not eat after midnight the day before and avoid carbonated beverages  Your physician has recommended that you have a cardiopulmonary stress test (CPX). CPX testing is a non-invasive measurement of heart and lung function. It replaces a traditional treadmill stress test. This type of test provides a tremendous amount of information that relates not only to your present condition but also for future outcomes. This test combines measurements of you ventilation, respiratory gas exchange in the lungs, electrocardiogram (EKG), blood pressure and physical response before, during, and following an exercise protocol.  Your physician recommends that you schedule a follow-up appointment in: 3 months with dr Gala Romney

## 2016-02-25 NOTE — Progress Notes (Signed)
Patient ID: Logan [name], male   DOB: 08/02/1975, 40 y.o.   MRN: [id number]   ADVANCED HF CLINIC NOTE  Patient ID: Logan [name], male   DOB: 06/29/1976, 40 y.o.   MRN: [id number] PCP: None   HPI:  [name] is a 40 year old male with CAD s/p CABG x 3; LIMA-LAD, SVG -obtuse marginal, SVG-RCA (07/2012), ischemic cardiomyopathy- Hx of anterolateral MI with DES to LAD; [place]infar[place] thrombosis of the stent and repeat stenting. He also has a history of nicotine dependence, previous ETOH abuse, TIA 05/2014, PAF and non-compliance.  Admitted early December 2016 with CP. Had myoview extensive scar with minimal peri-infarct ischemia. Areas of scar correlated with findings on his cardiac MRI in 2014. Troponin remained mildly elevated but did not demonstrate typical rise and fall as expected with ACS - more consistent with significant cardiomyopathy. EF not  calculated. Later discharged home.   Admitted 7/5-7/6 with TIA symptoms in the setting of stopping Xarelto for several days (has had the same thing in the past). Improved spontaneously.  CT unremarkable.  No MRI with ICD (has a [employer] Sci that is MRI compatible, but radiology refused per patient). Needs to make follow up with stroke clinic.   He presents for follow up.  Overall has been feeling OK. Has good days and bad days. On the bad days just feels tired. Can do all ADLs but if walks quickly gets more SOB. Feels bloated at times.Taking lasix every day now. Weight stable 159-165. Taking all medications. Smoking 1/2 ppd. Has stopped drinking since he has been in the hospital as his wife won't let him. + claudication  On disability.  CPX Results 06/2012 Peak VO2: 21.9 ml/kg/min  predicted peak VO2: 52.9% VE/VCO2 slope: 33.7 OUES: 1.53 Peak RER: 1.31 Ventilatory Threshold: 17.4 % predicted peak VO2: 42%   L/RHC 06/2015  Prox RCA lesion, 100% stenosed.  Ost LM to LM lesion, 20% stenosed.  2nd Mrg lesion, 100%  stenosed.  SVG was injected is large.  The graft exhibits minimal luminal irregularities.  Mid LAD lesion, 40% stenosed.  Dist LAD lesion, 40% stenosed.  LIMA is small.  Insertion lesion, 70% stenosed.  SVG is very large.  The graft exhibits minimal luminal irregularities.   Findings:  RA = 2 RV = 27/05 PA = 19/2 (11) PCW = 6 Ao = 109/69 (86) LV = 115/8/20 Fick cardiac output/index = 3.9/2.1 SVR = 1719 PVR = 1.3 WU FA sat = 96% PA sat = 62%, 64%  Assessment: 1. Severe native 3v CAD with severe diffuse premature atherosclerosis 2. LIMA - LAD is very small due to competitive flow in native LAD. There is a bout a 70% narrowing at the insertion with the LAD 3. SVG - RPDA widely patent 4. SVR - OM2 widely patent 5. Normal hemodynamics with low filling pressures  L/RHC 07/2012 (prior to CABG) RA = 4  RV = 35/2/6  PA = 30/14 (20)  PCW = 15 v = 25  Fick cardiac output/index = 3.7/2.1  PVR = 1.4 Woods  FA sat = 97%  PA sat = 62%, 65%   Assessment:  1. 3 vessel disease with totally occluded RCA, severe disease in LCx, and 80% in-stent restenosis in LAD.  2. Low cardiac output, elevated filling pressures.   ECHO 04/2013: EF 20% with mural thrombus, grade I diastolic dysfunction, moderate RV dilation, moderate MR.  Limited ECHO (7/15): moderate LV dilation, EF 10-15%, restrictive diastolic function, there was an  apical thrombus noted, moderate functional MR, moderately decreased RV systolic function.   Labs (7/15): K 4.4, creatinine 0.87, HCT 42.6 Labs (8/15): LDL 158, HDL 56 Labs 05/2014: K 4.1 Creatinine 0.87   SH: Married, lives in [place], 2 kids, on disability, smokes 1 ppd, +ETOH  FH: Brother with MI at 59, grandmother MI at 54, mother SCD  ROS: All systems negative except as listed in HPI, PMH and Problem List.  Past Medical History:  Diagnosis Date  . Anginal pain (HCC)   . CAD (coronary artery disease) 01/22/12   a.anterolateral MI with DES to  LAD EF 25% b. reinfarction 8/13 with thrombosis of stent c. s/p CABG x 3: LIMA-LAD, SVG-OM, SVG-RCA (07/2012)   . Chronic systolic CHF (congestive heart failure) (HCC)    a. (07/2012) EF 20-25% diff HK with reg wall abnl, HK anterolat wall, akinesis inferosep, mod MR;  b. 01/2014 Echo: EF 10-15%, glob HK, mod MR.  Marland Kitchen GERD (gastroesophageal reflux disease)   . Hyperlipidemia   . Ischemic cardiomyopathy    a.  01/2014 Echo: EF 10-15%, glob HK;  b. 03/2014 S/P Subcutaneous ICD.  . LV (left ventricular) mural thrombus (HCC)    "just found it in Oct" (07/09/2012)  . Moderate mitral regurgitation   . Myocardial infarction (HCC) 01/2012; 02/2012; 04/2012  . Noncompliance   . Stroke [facility]) 04/2014   TIA in the setting of medical non-compliance with coumadin requiring hospitalization.  . Syncope   . Tobacco abuse     Current Outpatient Prescriptions  Medication Sig Dispense Refill  . aspirin EC 81 MG tablet Take 1 tablet (81 mg total) by mouth daily. 30 tablet 6  . atorvastatin (LIPITOR) 80 MG tablet Take 80 mg by mouth daily.    . carvedilol (COREG) 6.25 MG tablet Take 1 tablet (6.25 mg total) by mouth 2 (two) times daily with a meal. 60 tablet 6  . furosemide (LASIX) 20 MG tablet Take 20 mg by mouth daily as needed (leg swelling or weight gain). Reported on 01/13/2016    . lisinopril (PRINIVIL,ZESTRIL) 5 MG tablet Take 1 tablet (5 mg total) by mouth daily. 30 tablet 6  . rivaroxaban (XARELTO) 20 MG TABS tablet Take 1 tablet (20 mg total) by mouth daily with supper. 30 tablet 3  . spironolactone (ALDACTONE) 25 MG tablet Take 0.5 tablets (12.5 mg total) by mouth daily. 30 tablet 2  . nitroGLYCERIN (NITROLINGUAL) 0.4 MG/SPRAY spray Place 1 spray under the tongue every 5 (five) minutes x 3 doses as needed for chest pain. (Patient not taking: Reported on 01/13/2016) 12 g 12   No current facility-administered medications for this encounter.     Vitals:   02/25/16 1532  BP: 112/72  BP Location: Left Arm    Patient Position: Sitting  Cuff Size: Normal  Pulse: 70  SpO2: 99%  Weight: 159 lb 12.8 oz (72.5 kg)  Height: 5\' 8"  (1.727 m)    PHYSICAL EXAM: General:   No respiratory difficulty HEENT: normal Neck: supple. JVP 6-7 cm. Carotids 2+ bilat; no bruits. No thyromegaly or nodule appreciated. Cor: PMI nondisplaced. RRR. No M/G/R. Lungs: clear with prolonged expiratory phase.  Abdomen: soft, NT, ND, no HSM. No bruits or masses. +BS  Extremities: no cyanosis, clubbing, rash, no edema Neuro: alert & oriented x 3, cranial nerves grossly intact. moves all 4 extremities w/o difficulty. Affect pleasant.  ASSESSMENT & PLAN:  1) Chronic systolic HF: Echo (11/15) with EF 10-15%. Ischemic cardiomyopathy, not much improvement in LV  function post-CABG.  Has Subcutaneous [employer] ICD.  - NYHA class III-IIIB  symptoms. Volume status looks ok    - Continue carvedilol 6.25 mg bid, spiro 12.5mg  daily. - Increase lisinopril to 5 mg bid. BP too soft to switch to [employer].  -Given progressive symptoms will repeat CPX though he is not good candidate for advanced therapies due to non-compliance and ongoing smoking.  2) Chest pain/CAD: s/p CABG x 3 in 1/14. - No further CP.  - LHC 06/2015 with severe native disease. Patent SVG (RPDA) and SVR (OM2).  LIMA is very small due to competitive flow in native LAD. There is a bout a 70% narrowing at the insertion with the LAD - Continue b-blocker, atorvastatin 80 mg daily and ASA 81.  3) Mural Thrombus:  Not able to handle coumadin. Continue to Xarelto for thrombus and PAF.  4) OSA:  Sleep study not suggestive of OSA.  5) Hyperlipidemia: On atorvastatin 80 mg daily.  6) ETOH abuse: - In remission 7) Current Smoker:  - Has cut back to 1/2 ppd - Encouraged to stop all together.   8) TIA- 05/2014 and 01/2016.  - Continue Xarelto and statin.   - Is supposed to follow up with Stroke clinic. Urged him to make appointment.  9) PAF - This patients CHA2DS2-VASc  Score and unadjusted Ischemic Stroke Rate (% per year) is equal to 4.8 % stroke rate/year from a score of 4  Continue Xarelto.  10) Claudication  - Check ABIs  Bensimhon, Daniel,MD 10:48 PM

## 2016-03-08 ENCOUNTER — Encounter (HOSPITAL_COMMUNITY): Payer: Medicare Other

## 2016-03-09 ENCOUNTER — Encounter: Payer: Self-pay | Admitting: Internal Medicine

## 2016-03-15 ENCOUNTER — Other Ambulatory Visit (HOSPITAL_COMMUNITY): Payer: Self-pay | Admitting: Internal Medicine

## 2016-03-15 DIAGNOSIS — I739 Peripheral vascular disease, unspecified: Secondary | ICD-10-CM

## 2016-03-21 ENCOUNTER — Ambulatory Visit (HOSPITAL_COMMUNITY)
Admission: RE | Admit: 2016-03-21 | Discharge: 2016-03-21 | Disposition: A | Discharge status: Home / Self Care | Payer: Medicare Other | Source: Ambulatory Visit | Attending: Cardiovascular Disease | Admitting: Cardiovascular Disease

## 2016-03-21 DIAGNOSIS — I739 Peripheral vascular disease, unspecified: Secondary | ICD-10-CM | POA: Insufficient documentation

## 2016-03-24 ENCOUNTER — Encounter: Payer: Medicare Other | Admitting: Internal Medicine

## 2016-03-24 ENCOUNTER — Ambulatory Visit (HOSPITAL_COMMUNITY): Payer: Medicare Other | Attending: Internal Medicine

## 2016-03-24 ENCOUNTER — Inpatient Hospital Stay (HOSPITAL_COMMUNITY): Admission: RE | Admit: 2016-03-24 | Payer: Medicare Other | Source: Ambulatory Visit

## 2016-03-24 DIAGNOSIS — I5022 Chronic systolic (congestive) heart failure: Secondary | ICD-10-CM | POA: Insufficient documentation

## 2016-03-25 DIAGNOSIS — I5022 Chronic systolic (congestive) heart failure: Secondary | ICD-10-CM | POA: Diagnosis not present

## 2016-04-18 ENCOUNTER — Encounter: Payer: Self-pay | Admitting: Internal Medicine

## 2016-04-18 ENCOUNTER — Ambulatory Visit (INDEPENDENT_AMBULATORY_CARE_PROVIDER_SITE_OTHER): Payer: Medicare Other | Admitting: Internal Medicine

## 2016-04-18 VITALS — BP 112/66 | HR 65 | Ht 68.0 in | Wt 162.6 lb

## 2016-04-18 DIAGNOSIS — I5022 Chronic systolic (congestive) heart failure: Secondary | ICD-10-CM

## 2016-04-18 DIAGNOSIS — Z95810 Presence of automatic (implantable) cardiac defibrillator: Secondary | ICD-10-CM | POA: Diagnosis not present

## 2016-04-18 DIAGNOSIS — I255 Ischemic cardiomyopathy: Secondary | ICD-10-CM

## 2016-04-18 NOTE — Patient Instructions (Signed)
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
## 2016-05-05 ENCOUNTER — Encounter (HOSPITAL_COMMUNITY): Payer: Medicare Other | Admitting: Internal Medicine

## 2016-05-30 ENCOUNTER — Ambulatory Visit (HOSPITAL_COMMUNITY)
Admission: RE | Admit: 2016-05-30 | Discharge: 2016-05-30 | Disposition: A | Discharge status: Home / Self Care | Payer: Medicare Other | Source: Ambulatory Visit | Attending: Internal Medicine | Admitting: Internal Medicine

## 2016-05-30 VITALS — BP 124/80 | HR 58 | Wt 166.4 lb

## 2016-05-30 DIAGNOSIS — Z8673 Personal history of transient ischemic attack (TIA), and cerebral infarction without residual deficits: Secondary | ICD-10-CM | POA: Diagnosis not present

## 2016-05-30 DIAGNOSIS — I5022 Chronic systolic (congestive) heart failure: Secondary | ICD-10-CM | POA: Insufficient documentation

## 2016-05-30 DIAGNOSIS — Z7901 Long term (current) use of anticoagulants: Secondary | ICD-10-CM | POA: Insufficient documentation

## 2016-05-30 DIAGNOSIS — Z7982 Long term (current) use of aspirin: Secondary | ICD-10-CM | POA: Insufficient documentation

## 2016-05-30 DIAGNOSIS — I739 Peripheral vascular disease, unspecified: Secondary | ICD-10-CM | POA: Diagnosis not present

## 2016-05-30 DIAGNOSIS — I25118 Atherosclerotic heart disease of native coronary artery with other forms of angina pectoris: Secondary | ICD-10-CM

## 2016-05-30 DIAGNOSIS — Z95810 Presence of automatic (implantable) cardiac defibrillator: Secondary | ICD-10-CM | POA: Diagnosis not present

## 2016-05-30 DIAGNOSIS — I255 Ischemic cardiomyopathy: Secondary | ICD-10-CM | POA: Diagnosis not present

## 2016-05-30 DIAGNOSIS — I2510 Atherosclerotic heart disease of native coronary artery without angina pectoris: Secondary | ICD-10-CM | POA: Insufficient documentation

## 2016-05-30 DIAGNOSIS — I252 Old myocardial infarction: Secondary | ICD-10-CM | POA: Insufficient documentation

## 2016-05-30 DIAGNOSIS — G458 Other transient cerebral ischemic attacks and related syndromes: Secondary | ICD-10-CM | POA: Diagnosis not present

## 2016-05-30 DIAGNOSIS — Z720 Tobacco use: Secondary | ICD-10-CM

## 2016-05-30 DIAGNOSIS — E785 Hyperlipidemia, unspecified: Secondary | ICD-10-CM | POA: Insufficient documentation

## 2016-05-30 DIAGNOSIS — I480 Paroxysmal atrial fibrillation: Secondary | ICD-10-CM | POA: Insufficient documentation

## 2016-05-30 DIAGNOSIS — F1011 Alcohol abuse, in remission: Secondary | ICD-10-CM | POA: Diagnosis not present

## 2016-05-30 DIAGNOSIS — F1010 Alcohol abuse, uncomplicated: Secondary | ICD-10-CM

## 2016-05-30 DIAGNOSIS — Z8249 Family history of ischemic heart disease and other diseases of the circulatory system: Secondary | ICD-10-CM | POA: Diagnosis not present

## 2016-05-30 DIAGNOSIS — F17210 Nicotine dependence, cigarettes, uncomplicated: Secondary | ICD-10-CM | POA: Insufficient documentation

## 2016-05-30 DIAGNOSIS — K219 Gastro-esophageal reflux disease without esophagitis: Secondary | ICD-10-CM | POA: Diagnosis not present

## 2016-05-30 DIAGNOSIS — Z951 Presence of aortocoronary bypass graft: Secondary | ICD-10-CM | POA: Diagnosis not present

## 2016-05-30 DIAGNOSIS — Z79899 Other long term (current) drug therapy: Secondary | ICD-10-CM | POA: Diagnosis not present

## 2016-05-30 LAB — BASIC METABOLIC PANEL
Anion gap: 7 (ref 5–15)
BUN: 9 mg/dL (ref 6–20)
CHLORIDE: 104 mmol/L (ref 101–111)
CO2: 25 mmol/L (ref 22–32)
CREATININE: 1.05 mg/dL (ref 0.61–1.24)
Calcium: 9.5 mg/dL (ref 8.9–10.3)
GFR calc Af Amer: >60 mL/min (ref >60)
GFR calc non Af Amer: >60 mL/min (ref >60)
GLUCOSE: 88 mg/dL (ref 65–99)
Potassium: 4.2 mmol/L (ref 3.5–5.1)
SODIUM: 136 mmol/L (ref 135–145)

## 2016-05-30 LAB — BRAIN NATRIURETIC PEPTIDE: B NATRIURETIC PEPTIDE 5: 105.1 pg/mL — AB (ref 0.0–100.0)

## 2016-05-30 MED ORDER — SACUBITRIL-VALSARTAN 24-26 MG PO TABS: 1.0000 | ORAL_TABLET | Freq: Two times a day (BID) | ORAL | 6 refills | Status: DC

## 2016-05-30 MED FILL — ENTRESTO 24 MG-26 MG TABLET: 24-26 | 30 days supply | Qty: 60 | Fill #0

## 2016-05-30 NOTE — Patient Instructions (Signed)
STOP Lisinopril WAIT 36 HOURS  START Entresto 24/26 mg, one tab twice a day  Labs today We will only contact you if something comes back abnormal or we need to make some changes. Otherwise no news is good news!  Your physician recommends that you schedule a follow-up appointment in: 2 weeks with the CHF Pharmacist and 6-8 weeks with Dr Gala Romney  Do the following things EVERYDAY: 1) Weigh yourself in the morning before breakfast. Write it down and keep it in a log. 2) Take your medicines as prescribed 3) Eat low salt foods-Limit salt (sodium) to 2000 mg per day.  4) Stay as active as you can everyday 5) Limit all fluids for the day to less than 2 liters

## 2016-05-30 NOTE — Progress Notes (Signed)
Patient ID: Logan [name] [name], male   DOB: 08/08/1975, 40 y.o.   MRN: [id number]   ADVANCED HF CLINIC NOTE  Patient ID: Logan [name] [name], male   DOB: 11/22/1975, 40 y.o.   MRN: [id number] PCP: None   HPI:  Mr. Logan ClevelandWigington is a 40 year old male with CAD s/p CABG x 3; LIMA-LAD, SVG -obtuse marginal, SVG-RCA (07/2012), ischemic cardiomyopathy- Hx of anterolateral MI with DES to LAD; [place] of the stent and repeat stenting. He also has a history of nicotine dependence, previous ETOH abuse, TIA 05/2014, PAF and non-compliance.  Admitted early December 2016 with CP. Had myoview extensive scar with minimal peri-infarct ischemia. Areas of scar correlated with findings on his cardiac MRI in 2014. Troponin remained mildly elevated but did not demonstrate typical rise and fall as expected with ACS - more consistent with significant cardiomyopathy. EF not  calculated. Later discharged home.   Admitted 7/5-7/6 with TIA symptoms in the setting of stopping Xarelto for several days (has had the same thing in the past). Improved spontaneously.  CT unremarkable.  No MRI with ICD (has a [employer] that is MRI compatible, but radiology refused per patient). Needs to make follow up with stroke clinic.   He presents today for follow up.  Weight up 7 lbs since visit in August. Has been feeling great.  Weight at home 160 (wearing heavy boots).  Working on stopping cigarettes. Trying to vape to get over the habit. Still smoking about a pack a day. Will have an occasional beer. Continues to take lasix every day. Denies DOE on flat ground. Gets SOB during sex and stairs.  Otherwise no complaints. Still has not followed up with Stroke Clinic.  Hasn't had any TIA symptoms so hasn't felt it was necessary.   CPX Results 03/2016 Peak VO2: 19.4 ml/kg/min  predicted peak VO2: 48.8% VE/VCO2 slope: 29.5 OUES: 1.85 Peak RER: 1.08 Ventilatory Threshold: 17.8% predicted peak VO2: 44.7%    CPX  Results 06/2012 Peak VO2: 21.9 ml/kg/min  predicted peak VO2: 52.9% VE/VCO2 slope: 33.7 OUES: 1.53 Peak RER: 1.31 Ventilatory Threshold: 17.4 % predicted peak VO2: 42%   L/RHC 06/2015  Prox RCA lesion, 100% stenosed.  Ost LM to LM lesion, 20% stenosed.  2nd Mrg lesion, 100% stenosed.  SVG was injected is large.  The graft exhibits minimal luminal irregularities.  Mid LAD lesion, 40% stenosed.  Dist LAD lesion, 40% stenosed.  LIMA is small.  Insertion lesion, 70% stenosed.  SVG is very large.  The graft exhibits minimal luminal irregularities.   Findings:  RA = 2 RV = 27/05 PA = 19/2 (11) PCW = 6 Ao = 109/69 (86) LV = 115/8/20 Fick cardiac output/index = 3.9/2.1 SVR = 1719 PVR = 1.3 WU FA sat = 96% PA sat = 62%, 64%  Assessment: 1. Severe native 3v CAD with severe diffuse premature atherosclerosis 2. LIMA - LAD is very small due to competitive flow in native LAD. There is a bout a 70% narrowing at the insertion with the LAD 3. SVG - RPDA widely patent 4. SVR - OM2 widely patent 5. Normal hemodynamics with low filling pressures  L/RHC 07/2012 (prior to CABG) RA = 4  RV = 35/2/6  PA = 30/14 (20)  PCW = 15 v = 25  Fick cardiac output/index = 3.7/2.1  PVR = 1.4 [place]  FA sat = 97%  PA sat = 62%, 65%   Assessment:  1. 3 vessel disease with totally occluded RCA,  severe disease in LCx, and 80% in-stent restenosis in LAD.  2. Low cardiac output, elevated filling pressures.   ECHO 04/2013: EF 20% with mural thrombus, grade I diastolic dysfunction, moderate RV dilation, moderate MR.  Limited ECHO (7/15): moderate LV dilation, EF 10-15%, restrictive diastolic function, there was an apical thrombus noted, moderate functional MR, moderately decreased RV systolic function.   Labs (7/15): K 4.4, creatinine 0.87, HCT 42.6 Labs (8/15): LDL 158, HDL 56 Labs 05/2014: K 4.1 Creatinine 0.87   SH: Married, lives in [place], 2 kids, on disability, smokes 1 ppd,  +ETOH  FH: Brother with MI at 59, grandmother MI at 72, mother SCD  ROS: All systems negative except as listed in HPI, PMH and Problem List.  Past Medical History:  Diagnosis Date  . Anginal pain (HCC)   . CAD (coronary artery disease) 01/22/12   a.anterolateral MI with DES to LAD EF 25% b. reinfarction 8/13 with thrombosis of stent c. s/p CABG x 3: LIMA-LAD, SVG-OM, SVG-RCA (07/2012)   . Chronic systolic CHF (congestive heart failure) (HCC)    a. (07/2012) EF 20-25% diff HK with reg wall abnl, HK anterolat wall, akinesis inferosep, mod MR;  b. 01/2014 Echo: EF 10-15%, glob HK, mod MR.  Marland Kitchen GERD (gastroesophageal reflux disease)   . Hyperlipidemia   . Ischemic cardiomyopathy    a.  01/2014 Echo: EF 10-15%, glob HK;  b. 03/2014 S/P Subcutaneous ICD.  . LV (left ventricular) mural thrombus    "just found it in Oct" (07/09/2012)  . Moderate mitral regurgitation   . Myocardial infarction 01/2012; 02/2012; 04/2012  . Noncompliance   . Stroke [facility]) 04/2014   TIA in the setting of medical non-compliance with coumadin requiring hospitalization.  . Syncope   . Tobacco abuse     Current Outpatient Prescriptions  Medication Sig Dispense Refill  . aspirin EC 81 MG tablet Take 1 tablet (81 mg total) by mouth daily. 30 tablet 6  . atorvastatin (LIPITOR) 80 MG tablet Take 80 mg by mouth daily.    . carvedilol (COREG) 6.25 MG tablet Take 1 tablet (6.25 mg total) by mouth 2 (two) times daily with a meal. 60 tablet 6  . furosemide (LASIX) 20 MG tablet Take 20 mg by mouth daily.    Marland Kitchen lisinopril (PRINIVIL,ZESTRIL) 5 MG tablet TAKE 1 TABLET BY MOUTH TWICE DAILY 180 tablet 6  . rivaroxaban (XARELTO) 20 MG TABS tablet Take 1 tablet (20 mg total) by mouth daily with supper. 30 tablet 3  . spironolactone (ALDACTONE) 25 MG tablet Take 25 mg by mouth daily.    . nitroGLYCERIN (NITROLINGUAL) 0.4 MG/SPRAY spray Place 1 spray under the tongue every 5 (five) minutes x 3 doses as needed for chest pain. (Patient not taking:  Reported on 05/30/2016) 12 g 12   No current facility-administered medications for this encounter.     Vitals:   05/30/16 1107  BP: 124/80  BP Location: Right Arm  Patient Position: Sitting  Cuff Size: Normal  Pulse: (!) 58  SpO2: 99%  Weight: 166 lb 6.4 oz (75.5 kg)   Wt Readings from Last 3 Encounters:  05/30/16 166 lb 6.4 oz (75.5 kg)  04/18/16 162 lb 9.6 oz (73.8 kg)  02/25/16 159 lb 12.8 oz (72.5 kg)     PHYSICAL EXAM: General:  Well appearing, NAD HEENT: Normal Neck: supple. JVP flat,. Carotids 2+ bilat; no bruits. No thyromegaly or nodule appreciated. Cor: PMI nondisplaced. RRR. No M/G/R. Lungs: CTAB, normal effort.   Abdomen:  soft, NT, ND, no HSM. No bruits or masses. +BS  Extremities: no cyanosis, clubbing, rash, No peripheral edema. Neuro: Alert & oriented x 3, cranial nerves grossly intact. moves all 4 extremities w/o difficulty. Affect pleasant.  ASSESSMENT & PLAN:  1) Chronic systolic HF: Echo (11/15) with EF 10-15%. Ischemic cardiomyopathy, not much improvement in LV function post-CABG.  Has Subcutaneous [employer] ICD.  - Echo 01/07/2016 LVEF 25% - NYHA class III-IIIB  symptoms. Volume status looks ok    - Continue carvedilol 6.25 mg bid, spiro 12.5mg  daily. - Stop lisinopril. Will start him on Entresto 24/26 mg BID.   2) Chest pain/CAD: s/p CABG x 3 in 1/14. - Denies chest pain.  - LHC 06/2015 with severe native disease. Patent SVG (RPDA) and SVR (OM2).  LIMA is very small due to competitive flow in native LAD. There is a bout a 70% narrowing at the insertion with the LAD - Continue b-blocker, atorvastatin 80 mg daily and ASA 81.  3) Mural Thrombus:  Not able to handle coumadin. Continue to Xarelto for thrombus and PAF.  4) OSA:  Sleep study not suggestive of OSA.  5) Hyperlipidemia: On atorvastatin 80 mg daily.  6) ETOH abuse: - In remission. Has an occasional drink. Asked to stop completely.  7) Current Smoker:  - Still smoking up to a pack a  day. States he is attempting to quit with vaping. Urged to stop completely.  8) TIA- 05/2014 and 01/2016.  - Continue Xarelto and statin.   - Is supposed to follow up with Stroke clinic. Urged him to make appointment.  9) PAF - This patients CHA2DS2-VASc Score and unadjusted Ischemic Stroke Rate (% per year) is equal to 4.8 % stroke rate/year from a score of 4  Continue Xarelto.  10) Claudication  - ABIs normal 03/21/16.  Labs today. Switching to [facility] as above. Will have see Pharm-D in 2 weeks for repeat BMET and med titration as tolerated. 6-8 weeks with MD.   Graciella Freer, PA-C 11:24 AM  Total time spent > 25 minutes over half that spent discussing the above.

## 2016-05-30 NOTE — Progress Notes (Signed)
Advanced Heart Failure Medication Review by a Pharmacist  Does the patient  feel that his/her medications are working for him/her?  yes  Has the patient been experiencing any side effects to the medications prescribed?  no  Does the patient measure his/her own blood pressure or blood glucose at home?  no   Does the patient have any problems obtaining medications due to transportation or finances?   no  Understanding of regimen: good Understanding of indications: good Potential of compliance: good Patient understands to avoid NSAIDs. Patient understands to avoid decongestants.  Issues to address at subsequent visits: None   Pharmacist comments:  Logan [name] is a pleasant 40 yo M presenting without a medication list but with good recall of his regimen. He reports good compliance with his regimen but did admit to taking lasix daily instead of PRN and spironolactone 25 mg daily instead of 12.5 mg daily since it is too difficult for him to cut the tablet in half.   Tyler Deis. Bonnye Fava, PharmD, BCPS, CPP Clinical Pharmacist Pager: [phone] Phone: [phone] 05/30/2016 11:25 AM      Time with patient: 10 minutes Preparation and documentation time: 2 minutes Total time: 12 minutes

## 2016-06-15 ENCOUNTER — Ambulatory Visit (HOSPITAL_COMMUNITY): Payer: Medicare Other

## 2016-07-11 ENCOUNTER — Inpatient Hospital Stay (HOSPITAL_COMMUNITY): Admission: RE | Admit: 2016-07-11 | Payer: Medicare Other | Source: Ambulatory Visit

## 2016-07-18 ENCOUNTER — Encounter: Payer: Medicare Other | Admitting: *Deleted

## 2016-07-18 ENCOUNTER — Telehealth: Payer: Self-pay | Admitting: Cardiology

## 2016-07-18 NOTE — Telephone Encounter (Signed)
Spoke with pt and reminded pt of remote transmission that is due today. Pt verbalized understanding.   

## 2016-07-22 ENCOUNTER — Encounter: Payer: Self-pay | Admitting: Cardiology

## 2016-08-03 ENCOUNTER — Inpatient Hospital Stay (HOSPITAL_COMMUNITY): Admission: RE | Admit: 2016-08-03 | Payer: Medicare Other | Source: Ambulatory Visit

## 2016-08-25 ENCOUNTER — Encounter: Payer: Self-pay | Admitting: Cardiology

## 2016-09-09 ENCOUNTER — Encounter: Payer: Self-pay | Admitting: Cardiology

## 2016-09-27 ENCOUNTER — Encounter (HOSPITAL_COMMUNITY): Payer: Self-pay | Admitting: *Deleted

## 2016-09-27 ENCOUNTER — Ambulatory Visit (HOSPITAL_COMMUNITY)
Admission: EM | Admit: 2016-09-27 | Discharge: 2016-09-27 | Disposition: A | Discharge status: Home / Self Care | Payer: Medicare Other | Attending: Internal Medicine | Admitting: Internal Medicine

## 2016-09-27 DIAGNOSIS — R112 Nausea with vomiting, unspecified: Secondary | ICD-10-CM | POA: Diagnosis not present

## 2016-09-27 DIAGNOSIS — G43A0 Cyclical vomiting, not intractable: Secondary | ICD-10-CM

## 2016-09-27 DIAGNOSIS — R300 Dysuria: Secondary | ICD-10-CM | POA: Diagnosis not present

## 2016-09-27 DIAGNOSIS — N3001 Acute cystitis with hematuria: Secondary | ICD-10-CM | POA: Diagnosis not present

## 2016-09-27 DIAGNOSIS — R110 Nausea: Secondary | ICD-10-CM | POA: Diagnosis present

## 2016-09-27 DIAGNOSIS — K529 Noninfective gastroenteritis and colitis, unspecified: Secondary | ICD-10-CM | POA: Diagnosis not present

## 2016-09-27 DIAGNOSIS — R1115 Cyclical vomiting syndrome unrelated to migraine: Secondary | ICD-10-CM

## 2016-09-27 LAB — POCT URINALYSIS DIP (DEVICE)
BILIRUBIN URINE: NEGATIVE
Glucose, UA: NEGATIVE mg/dL
Ketones, ur: NEGATIVE mg/dL
NITRITE: NEGATIVE
Protein, ur: >=300 mg/dL — AB
Specific Gravity, Urine: 1.025 (ref 1.005–1.030)
Urobilinogen, UA: 0.2 mg/dL (ref 0.0–1.0)
pH: 5.5 (ref 5.0–8.0)

## 2016-09-27 MED ORDER — ONDANSETRON 8 MG PO TBDP: 8.0000 mg | ORAL_TABLET | Freq: Three times a day (TID) | ORAL | 0 refills | Status: DC | PRN

## 2016-09-27 MED ORDER — CIPROFLOXACIN HCL 500 MG PO TABS
500.0000 mg | ORAL_TABLET | Freq: Two times a day (BID) | ORAL | 0 refills | Status: DC
Start: 2016-09-27 — End: 2017-02-05

## 2016-09-27 MED ORDER — PHENAZOPYRIDINE HCL 200 MG PO TABS: 200.0000 mg | ORAL_TABLET | Freq: Three times a day (TID) | ORAL | 0 refills | Status: DC
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
## 2016-09-27 NOTE — ED Triage Notes (Signed)
Nausea  Vomiting  Diarrhea    Burns   When  Urinated   Urine  Was  Dark  This  Am     Daughter  Has  Similar  Symptoms

## 2016-09-27 NOTE — ED Notes (Signed)
Patient and minor child are being seen in the same treatment room

## 2016-09-29 LAB — URINE CULTURE: Culture: NO GROWTH

## 2016-09-29 LAB — URINE CYTOLOGY ANCILLARY ONLY
Chlamydia: POSITIVE — AB
Neisseria Gonorrhea: NEGATIVE
Trichomonas: NEGATIVE

## 2016-09-30 ENCOUNTER — Telehealth (HOSPITAL_COMMUNITY): Payer: Self-pay | Admitting: Internal Medicine

## 2016-09-30 MED ORDER — AZITHROMYCIN 500 MG PO TABS: 1000.0000 mg | ORAL_TABLET | Freq: Once | ORAL | 0 refills | Status: AC

## 2016-09-30 NOTE — Telephone Encounter (Signed)
Clinical staff, please let patient know that test for chlamydia was positive.  Rx zithromax sent to pharmacy of record, [facility] at [address] and [address].  Sexual partners need to be notified and tested/treated.  Condoms may reduce risk of reinfection.   Urine culture was negative (which does not rule out prostatitis).  Recheck or followup with urologist or a PCP for further evaluation if symptoms are not improving.  LM

## 2017-02-05 ENCOUNTER — Emergency Department: Payer: Medicare Other

## 2017-02-05 ENCOUNTER — Encounter: Payer: Self-pay | Admitting: Emergency Medicine

## 2017-02-05 ENCOUNTER — Emergency Department
Admission: EM | Admit: 2017-02-05 | Discharge: 2017-02-05 | Disposition: A | Discharge status: Home / Self Care | Payer: Medicare Other | Attending: Emergency Medicine | Admitting: Emergency Medicine

## 2017-02-05 DIAGNOSIS — I5022 Chronic systolic (congestive) heart failure: Secondary | ICD-10-CM | POA: Insufficient documentation

## 2017-02-05 DIAGNOSIS — I2510 Atherosclerotic heart disease of native coronary artery without angina pectoris: Secondary | ICD-10-CM | POA: Insufficient documentation

## 2017-02-05 DIAGNOSIS — Z7982 Long term (current) use of aspirin: Secondary | ICD-10-CM | POA: Diagnosis not present

## 2017-02-05 DIAGNOSIS — I480 Paroxysmal atrial fibrillation: Secondary | ICD-10-CM | POA: Diagnosis not present

## 2017-02-05 DIAGNOSIS — I110 Hypertensive heart disease with heart failure: Secondary | ICD-10-CM | POA: Insufficient documentation

## 2017-02-05 DIAGNOSIS — F17210 Nicotine dependence, cigarettes, uncomplicated: Secondary | ICD-10-CM | POA: Diagnosis not present

## 2017-02-05 DIAGNOSIS — Z8673 Personal history of transient ischemic attack (TIA), and cerebral infarction without residual deficits: Secondary | ICD-10-CM | POA: Insufficient documentation

## 2017-02-05 DIAGNOSIS — R079 Chest pain, unspecified: Secondary | ICD-10-CM | POA: Insufficient documentation

## 2017-02-05 DIAGNOSIS — Z79899 Other long term (current) drug therapy: Secondary | ICD-10-CM | POA: Insufficient documentation

## 2017-02-05 DIAGNOSIS — I252 Old myocardial infarction: Secondary | ICD-10-CM | POA: Insufficient documentation

## 2017-02-05 DIAGNOSIS — Z7901 Long term (current) use of anticoagulants: Secondary | ICD-10-CM | POA: Diagnosis not present

## 2017-02-05 LAB — BASIC METABOLIC PANEL
ANION GAP: 12 (ref 5–15)
BUN: 7 mg/dL (ref 6–20)
CALCIUM: 9.3 mg/dL (ref 8.9–10.3)
CO2: 18 mmol/L — ABNORMAL LOW (ref 22–32)
Chloride: 107 mmol/L (ref 101–111)
Creatinine, Ser: 0.97 mg/dL (ref 0.61–1.24)
GFR calc Af Amer: >60 mL/min (ref >60)
GLUCOSE: 94 mg/dL (ref 65–99)
POTASSIUM: 3.8 mmol/L (ref 3.5–5.1)
SODIUM: 137 mmol/L (ref 135–145)

## 2017-02-05 LAB — CBC
HEMATOCRIT: 41.4 % (ref 40.0–52.0)
Hemoglobin: 14.5 g/dL (ref 13.0–18.0)
MCH: 34.3 pg — ABNORMAL HIGH (ref 26.0–34.0)
MCHC: 35.1 g/dL (ref 32.0–36.0)
MCV: 97.8 fL (ref 80.0–100.0)
Platelets: 237 10*3/uL (ref 150–440)
RBC: 4.23 MIL/uL — ABNORMAL LOW (ref 4.40–5.90)
RDW: 12.4 % (ref 11.5–14.5)
WBC: 11.8 10*3/uL — AB (ref 3.8–10.6)

## 2017-02-05 LAB — TROPONIN I: TROPONIN I: 0.06 ng/mL — AB (ref <0.03)

## 2017-02-05 NOTE — Discharge Instructions (Signed)
Please seek medical attention for any high fevers, chest pain, shortness of breath, change in behavior, persistent vomiting, bloody stool or any other new or concerning symptoms.  
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
## 2017-02-27 ENCOUNTER — Ambulatory Visit (INDEPENDENT_AMBULATORY_CARE_PROVIDER_SITE_OTHER): Payer: Medicare Other | Admitting: *Deleted

## 2017-02-27 DIAGNOSIS — I255 Ischemic cardiomyopathy: Secondary | ICD-10-CM

## 2017-02-27 NOTE — Progress Notes (Signed)
Remote ICD transmission.   

## 2017-03-03 ENCOUNTER — Emergency Department (HOSPITAL_COMMUNITY): Payer: Medicare Other | Admitting: Certified Registered Nurse Anesthetist

## 2017-03-03 ENCOUNTER — Other Ambulatory Visit (HOSPITAL_COMMUNITY): Payer: Medicare Other

## 2017-03-03 ENCOUNTER — Encounter (HOSPITAL_COMMUNITY): Payer: Self-pay | Admitting: Certified Registered Nurse Anesthetist

## 2017-03-03 ENCOUNTER — Emergency Department (HOSPITAL_COMMUNITY): Payer: Medicare Other

## 2017-03-03 ENCOUNTER — Inpatient Hospital Stay (HOSPITAL_COMMUNITY)
Admission: EM | Admit: 2017-03-03 | Discharge: 2017-03-08 | DRG: 023 | Disposition: A | Discharge status: Rehabilitation Facility | Payer: Medicare Other | Attending: Neurology | Admitting: Neurology

## 2017-03-03 ENCOUNTER — Inpatient Hospital Stay (HOSPITAL_COMMUNITY): Payer: Medicare Other

## 2017-03-03 ENCOUNTER — Encounter (HOSPITAL_COMMUNITY)
Admission: EM | Disposition: A | Discharge status: Rehabilitation Facility | Payer: Self-pay | Source: Home / Self Care | Attending: Neurology

## 2017-03-03 DIAGNOSIS — G8194 Hemiplegia, unspecified affecting left nondominant side: Secondary | ICD-10-CM | POA: Diagnosis present

## 2017-03-03 DIAGNOSIS — K219 Gastro-esophageal reflux disease without esophagitis: Secondary | ICD-10-CM | POA: Diagnosis present

## 2017-03-03 DIAGNOSIS — Z9119 Patient's noncompliance with other medical treatment and regimen: Secondary | ICD-10-CM

## 2017-03-03 DIAGNOSIS — Z8673 Personal history of transient ischemic attack (TIA), and cerebral infarction without residual deficits: Secondary | ICD-10-CM

## 2017-03-03 DIAGNOSIS — I63119 Cerebral infarction due to embolism of unspecified vertebral artery: Secondary | ICD-10-CM | POA: Diagnosis not present

## 2017-03-03 DIAGNOSIS — I63549 Cerebral infarction due to unspecified occlusion or stenosis of unspecified cerebellar artery: Secondary | ICD-10-CM | POA: Diagnosis not present

## 2017-03-03 DIAGNOSIS — I63531 Cerebral infarction due to unspecified occlusion or stenosis of right posterior cerebral artery: Secondary | ICD-10-CM | POA: Diagnosis not present

## 2017-03-03 DIAGNOSIS — R4781 Slurred speech: Secondary | ICD-10-CM | POA: Diagnosis present

## 2017-03-03 DIAGNOSIS — I639 Cerebral infarction, unspecified: Secondary | ICD-10-CM | POA: Diagnosis present

## 2017-03-03 DIAGNOSIS — Z95810 Presence of automatic (implantable) cardiac defibrillator: Secondary | ICD-10-CM

## 2017-03-03 DIAGNOSIS — F1210 Cannabis abuse, uncomplicated: Secondary | ICD-10-CM

## 2017-03-03 DIAGNOSIS — J9600 Acute respiratory failure, unspecified whether with hypoxia or hypercapnia: Secondary | ICD-10-CM | POA: Diagnosis not present

## 2017-03-03 DIAGNOSIS — I63431 Cerebral infarction due to embolism of right posterior cerebral artery: Secondary | ICD-10-CM | POA: Diagnosis not present

## 2017-03-03 DIAGNOSIS — I69354 Hemiplegia and hemiparesis following cerebral infarction affecting left non-dominant side: Secondary | ICD-10-CM | POA: Diagnosis not present

## 2017-03-03 DIAGNOSIS — Z8249 Family history of ischemic heart disease and other diseases of the circulatory system: Secondary | ICD-10-CM

## 2017-03-03 DIAGNOSIS — I255 Ischemic cardiomyopathy: Secondary | ICD-10-CM | POA: Diagnosis present

## 2017-03-03 DIAGNOSIS — Z7901 Long term (current) use of anticoagulants: Secondary | ICD-10-CM

## 2017-03-03 DIAGNOSIS — E785 Hyperlipidemia, unspecified: Secondary | ICD-10-CM | POA: Diagnosis present

## 2017-03-03 DIAGNOSIS — F17210 Nicotine dependence, cigarettes, uncomplicated: Secondary | ICD-10-CM | POA: Diagnosis present

## 2017-03-03 DIAGNOSIS — I6340 Cerebral infarction due to embolism of unspecified cerebral artery: Secondary | ICD-10-CM | POA: Diagnosis not present

## 2017-03-03 DIAGNOSIS — R29810 Facial weakness: Secondary | ICD-10-CM | POA: Diagnosis present

## 2017-03-03 DIAGNOSIS — I63441 Cerebral infarction due to embolism of right cerebellar artery: Secondary | ICD-10-CM | POA: Diagnosis present

## 2017-03-03 DIAGNOSIS — I340 Nonrheumatic mitral (valve) insufficiency: Secondary | ICD-10-CM | POA: Diagnosis present

## 2017-03-03 DIAGNOSIS — Z638 Other specified problems related to primary support group: Secondary | ICD-10-CM

## 2017-03-03 DIAGNOSIS — G8929 Other chronic pain: Secondary | ICD-10-CM

## 2017-03-03 DIAGNOSIS — Z720 Tobacco use: Secondary | ICD-10-CM | POA: Diagnosis not present

## 2017-03-03 DIAGNOSIS — R29712 NIHSS score 12: Secondary | ICD-10-CM | POA: Diagnosis present

## 2017-03-03 DIAGNOSIS — I63331 Cerebral infarction due to thrombosis of right posterior cerebral artery: Secondary | ICD-10-CM | POA: Diagnosis not present

## 2017-03-03 DIAGNOSIS — Z951 Presence of aortocoronary bypass graft: Secondary | ICD-10-CM

## 2017-03-03 DIAGNOSIS — I110 Hypertensive heart disease with heart failure: Secondary | ICD-10-CM | POA: Diagnosis present

## 2017-03-03 DIAGNOSIS — G8191 Hemiplegia, unspecified affecting right dominant side: Secondary | ICD-10-CM

## 2017-03-03 DIAGNOSIS — I2510 Atherosclerotic heart disease of native coronary artery without angina pectoris: Secondary | ICD-10-CM | POA: Diagnosis present

## 2017-03-03 DIAGNOSIS — I472 Ventricular tachycardia: Secondary | ICD-10-CM | POA: Diagnosis not present

## 2017-03-03 DIAGNOSIS — I69393 Ataxia following cerebral infarction: Secondary | ICD-10-CM | POA: Diagnosis not present

## 2017-03-03 DIAGNOSIS — I5043 Acute on chronic combined systolic (congestive) and diastolic (congestive) heart failure: Secondary | ICD-10-CM | POA: Diagnosis present

## 2017-03-03 DIAGNOSIS — Z7982 Long term (current) use of aspirin: Secondary | ICD-10-CM

## 2017-03-03 DIAGNOSIS — Z880 Allergy status to penicillin: Secondary | ICD-10-CM

## 2017-03-03 DIAGNOSIS — Z955 Presence of coronary angioplasty implant and graft: Secondary | ICD-10-CM

## 2017-03-03 DIAGNOSIS — G936 Cerebral edema: Secondary | ICD-10-CM | POA: Diagnosis present

## 2017-03-03 DIAGNOSIS — R269 Unspecified abnormalities of gait and mobility: Secondary | ICD-10-CM | POA: Diagnosis not present

## 2017-03-03 DIAGNOSIS — I252 Old myocardial infarction: Secondary | ICD-10-CM

## 2017-03-03 DIAGNOSIS — I059 Rheumatic mitral valve disease, unspecified: Secondary | ICD-10-CM | POA: Diagnosis not present

## 2017-03-03 DIAGNOSIS — F17200 Nicotine dependence, unspecified, uncomplicated: Secondary | ICD-10-CM | POA: Diagnosis not present

## 2017-03-03 DIAGNOSIS — Z789 Other specified health status: Secondary | ICD-10-CM | POA: Diagnosis not present

## 2017-03-03 DIAGNOSIS — M545 Low back pain, unspecified: Secondary | ICD-10-CM

## 2017-03-03 DIAGNOSIS — Z91199 Patient's noncompliance with other medical treatment and regimen due to unspecified reason: Secondary | ICD-10-CM

## 2017-03-03 DIAGNOSIS — H53462 Homonymous bilateral field defects, left side: Secondary | ICD-10-CM | POA: Diagnosis present

## 2017-03-03 DIAGNOSIS — I509 Heart failure, unspecified: Secondary | ICD-10-CM | POA: Diagnosis not present

## 2017-03-03 DIAGNOSIS — I69398 Other sequelae of cerebral infarction: Secondary | ICD-10-CM | POA: Diagnosis not present

## 2017-03-03 DIAGNOSIS — I5022 Chronic systolic (congestive) heart failure: Secondary | ICD-10-CM | POA: Diagnosis not present

## 2017-03-03 DIAGNOSIS — Z87892 Personal history of anaphylaxis: Secondary | ICD-10-CM

## 2017-03-03 DIAGNOSIS — J988 Other specified respiratory disorders: Secondary | ICD-10-CM | POA: Diagnosis not present

## 2017-03-03 DIAGNOSIS — Z91048 Other nonmedicinal substance allergy status: Secondary | ICD-10-CM

## 2017-03-03 DIAGNOSIS — Z91030 Bee allergy status: Secondary | ICD-10-CM

## 2017-03-03 DIAGNOSIS — J9690 Respiratory failure, unspecified, unspecified whether with hypoxia or hypercapnia: Secondary | ICD-10-CM | POA: Diagnosis present

## 2017-03-03 DIAGNOSIS — G459 Transient cerebral ischemic attack, unspecified: Secondary | ICD-10-CM | POA: Diagnosis not present

## 2017-03-03 HISTORY — PX: IR PERCUTANEOUS ART THROMBECTOMY/INFUSION INTRACRANIAL INC DIAG ANGIO: IMG6087

## 2017-03-03 HISTORY — PX: IR ANGIO INTRA EXTRACRAN SEL COM CAROTID INNOMINATE UNI R MOD SED: IMG5359

## 2017-03-03 HISTORY — PX: RADIOLOGY WITH ANESTHESIA: SHX6223

## 2017-03-03 LAB — PROTIME-INR
INR: 0.96
Prothrombin Time: 12.7 seconds (ref 11.4–15.2)

## 2017-03-03 LAB — DIFFERENTIAL
Basophils Absolute: 0 10*3/uL (ref 0.0–0.1)
Basophils Relative: 0 %
Eosinophils Absolute: 0 10*3/uL (ref 0.0–0.7)
Eosinophils Relative: 0 %
LYMPHS PCT: 11 %
Lymphs Abs: 1.2 10*3/uL (ref 0.7–4.0)
MONO ABS: 0.3 10*3/uL (ref 0.1–1.0)
MONOS PCT: 3 %
Neutro Abs: 9.4 10*3/uL — ABNORMAL HIGH (ref 1.7–7.7)
Neutrophils Relative %: 86 %

## 2017-03-03 LAB — I-STAT CHEM 8, ED
BUN: 15 mg/dL (ref 6–20)
Calcium, Ion: 1.12 mmol/L — ABNORMAL LOW (ref 1.15–1.40)
Chloride: 101 mmol/L (ref 101–111)
Creatinine, Ser: 0.8 mg/dL (ref 0.61–1.24)
Glucose, Bld: 156 mg/dL — ABNORMAL HIGH (ref 65–99)
HCT: 44 % (ref 39.0–52.0)
Hemoglobin: 15 g/dL (ref 13.0–17.0)
Potassium: 4.3 mmol/L (ref 3.5–5.1)
Sodium: 138 mmol/L (ref 135–145)
TCO2: 26 mmol/L (ref 22–32)

## 2017-03-03 LAB — COMPREHENSIVE METABOLIC PANEL
ALK PHOS: 76 U/L (ref 38–126)
ALT: 23 U/L (ref 17–63)
AST: 28 U/L (ref 15–41)
Albumin: 3.6 g/dL (ref 3.5–5.0)
Anion gap: 8 (ref 5–15)
BUN: 13 mg/dL (ref 6–20)
CHLORIDE: 103 mmol/L (ref 101–111)
CO2: 25 mmol/L (ref 22–32)
Calcium: 9.1 mg/dL (ref 8.9–10.3)
Creatinine, Ser: 0.93 mg/dL (ref 0.61–1.24)
Glucose, Bld: 155 mg/dL — ABNORMAL HIGH (ref 65–99)
Potassium: 4.3 mmol/L (ref 3.5–5.1)
SODIUM: 136 mmol/L (ref 135–145)
TOTAL PROTEIN: 6.7 g/dL (ref 6.5–8.1)
Total Bilirubin: 0.6 mg/dL (ref 0.3–1.2)

## 2017-03-03 LAB — CBC
HEMATOCRIT: 41.3 % (ref 39.0–52.0)
Hemoglobin: 14.5 g/dL (ref 13.0–17.0)
MCH: 34.4 pg — ABNORMAL HIGH (ref 26.0–34.0)
MCHC: 35.1 g/dL (ref 30.0–36.0)
MCV: 97.9 fL (ref 78.0–100.0)
Platelets: 260 10*3/uL (ref 150–400)
RBC: 4.22 MIL/uL (ref 4.22–5.81)
RDW: 13.4 % (ref 11.5–15.5)
WBC: 11 10*3/uL — AB (ref 4.0–10.5)

## 2017-03-03 LAB — I-STAT TROPONIN, ED: Troponin i, poc: 0.03 ng/mL (ref 0.00–0.08)

## 2017-03-03 LAB — RAPID URINE DRUG SCREEN, HOSP PERFORMED
AMPHETAMINES: NOT DETECTED
BENZODIAZEPINES: NOT DETECTED
Barbiturates: NOT DETECTED
Cocaine: NOT DETECTED
Opiates: NOT DETECTED
TETRAHYDROCANNABINOL: POSITIVE — AB

## 2017-03-03 LAB — CBG MONITORING, ED: Glucose-Capillary: 157 mg/dL — ABNORMAL HIGH (ref 65–99)

## 2017-03-03 LAB — MRSA PCR SCREENING: MRSA BY PCR: NEGATIVE

## 2017-03-03 LAB — APTT: aPTT: 26 seconds (ref 24–36)

## 2017-03-03 SURGERY — RADIOLOGY WITH ANESTHESIA
Anesthesia: General

## 2017-03-03 MED ORDER — PHENYLEPHRINE HCL 10 MG/ML IJ SOLN
INTRAMUSCULAR | Status: DC | PRN
  Administered 2017-03-03 (×2): 80 ug via INTRAVENOUS

## 2017-03-03 MED ORDER — ONDANSETRON HCL 4 MG/2ML IJ SOLN
4.0000 mg | Freq: Four times a day (QID) | INTRAMUSCULAR | Status: DC | PRN
  Administered 2017-03-03 – 2017-03-04 (×2): 4 mg via INTRAVENOUS
  Filled 2017-03-03 (×2): qty 2

## 2017-03-03 MED ORDER — LIDOCAINE HCL (CARDIAC) 20 MG/ML IV SOLN
INTRAVENOUS | Status: DC | PRN
  Administered 2017-03-03: 100 mg via INTRAVENOUS

## 2017-03-03 MED ORDER — PHENYLEPHRINE HCL 10 MG/ML IJ SOLN
INTRAVENOUS | Status: DC | PRN
  Administered 2017-03-03: 25 ug/min via INTRAVENOUS

## 2017-03-03 MED ORDER — PROPOFOL 1000 MG/100ML IV EMUL: 5.0000 ug/kg/min | INTRAVENOUS | Status: DC

## 2017-03-03 MED ORDER — SODIUM CHLORIDE 0.9 % IV SOLN
INTRAVENOUS | Status: DC
  Administered 2017-03-03 – 2017-03-05 (×4): via INTRAVENOUS

## 2017-03-03 MED ORDER — SUCCINYLCHOLINE CHLORIDE 20 MG/ML IJ SOLN
INTRAMUSCULAR | Status: DC | PRN
  Administered 2017-03-03: 140 mg via INTRAVENOUS

## 2017-03-03 MED ORDER — IOPAMIDOL (ISOVUE-370) INJECTION 76%
INTRAVENOUS | Status: AC
  Administered 2017-03-03: 100 mL via INTRAVENOUS
  Filled 2017-03-03: qty 100

## 2017-03-03 MED ORDER — EPTIFIBATIDE 20 MG/10ML IV SOLN
INTRAVENOUS | Status: DC
Start: 2017-03-03 — End: 2017-03-03
  Filled 2017-03-03: qty 10

## 2017-03-03 MED ORDER — PROPOFOL 10 MG/ML IV BOLUS
INTRAVENOUS | Status: DC | PRN
  Administered 2017-03-03: 100 mg via INTRAVENOUS

## 2017-03-03 MED ORDER — ARTIFICIAL TEARS OPHTHALMIC OINT
TOPICAL_OINTMENT | OPHTHALMIC | Status: DC | PRN
  Administered 2017-03-03: 1 via OPHTHALMIC

## 2017-03-03 MED ORDER — SODIUM CHLORIDE 0.9 % IJ SOLN
INTRAMUSCULAR | Status: DC | PRN
  Administered 2017-03-03 (×4): 25 ug via INTRA_ARTERIAL

## 2017-03-03 MED ORDER — ROCURONIUM BROMIDE 100 MG/10ML IV SOLN
INTRAVENOUS | Status: DC | PRN
  Administered 2017-03-03 (×2): 50 mg via INTRAVENOUS

## 2017-03-03 MED ORDER — VANCOMYCIN HCL 1000 MG IV SOLR
INTRAVENOUS | Status: DC | PRN
  Administered 2017-03-03: 1000 mg via INTRAVENOUS

## 2017-03-03 MED ORDER — IOPAMIDOL (ISOVUE-300) INJECTION 61%
INTRAVENOUS | Status: AC
  Administered 2017-03-03: 60 mL
  Filled 2017-03-03: qty 150

## 2017-03-03 MED ORDER — ATORVASTATIN CALCIUM 80 MG PO TABS
80.0000 mg | ORAL_TABLET | Freq: Every day | ORAL | Status: DC
  Administered 2017-03-03 – 2017-03-07 (×5): 80 mg via ORAL
  Filled 2017-03-03 (×5): qty 1

## 2017-03-03 MED ORDER — ACETAMINOPHEN 650 MG RE SUPP: 650.0000 mg | RECTAL | Status: DC | PRN

## 2017-03-03 MED ORDER — NICARDIPINE HCL IN NACL 20-0.86 MG/200ML-% IV SOLN
0.0000 mg/h | INTRAVENOUS | Status: DC
Start: 2017-03-03 — End: 2017-03-04
  Administered 2017-03-03: 5 mg/h via INTRAVENOUS
  Administered 2017-03-03: 2.5 mg/h via INTRAVENOUS
  Administered 2017-03-03 (×2): 7.5 mg/h via INTRAVENOUS
  Filled 2017-03-03 (×4): qty 200

## 2017-03-03 MED ORDER — SODIUM CHLORIDE 0.9 % IV SOLN
INTRAVENOUS | Status: DC
  Administered 2017-03-05 – 2017-03-08 (×5): via INTRAVENOUS

## 2017-03-03 MED ORDER — ACETAMINOPHEN 160 MG/5ML PO SOLN
650.0000 mg | ORAL | Status: DC | PRN
  Administered 2017-03-04 – 2017-03-06 (×4): 650 mg
  Filled 2017-03-03 (×4): qty 20.3

## 2017-03-03 MED ORDER — IOPAMIDOL (ISOVUE-300) INJECTION 61%
INTRAVENOUS | Status: AC
  Administered 2017-03-03: 36 mL
  Filled 2017-03-03: qty 150

## 2017-03-03 MED ORDER — ACETAMINOPHEN 325 MG PO TABS
650.0000 mg | ORAL_TABLET | ORAL | Status: DC | PRN
  Administered 2017-03-03 – 2017-03-04 (×3): 650 mg via ORAL
  Filled 2017-03-03 (×3): qty 2

## 2017-03-03 MED ORDER — LACTATED RINGERS IV SOLN
INTRAVENOUS | Status: DC | PRN
  Administered 2017-03-03: 08:00:00 via INTRAVENOUS

## 2017-03-03 MED ORDER — NITROGLYCERIN 1 MG/10 ML FOR IR/CATH LAB
INTRA_ARTERIAL | Status: AC
  Filled 2017-03-03: qty 10

## 2017-03-03 MED ORDER — FENTANYL CITRATE (PF) 100 MCG/2ML IJ SOLN
INTRAMUSCULAR | Status: DC | PRN
  Administered 2017-03-03: 200 ug via INTRAVENOUS

## 2017-03-03 MED ORDER — VANCOMYCIN HCL IN DEXTROSE 1-5 GM/200ML-% IV SOLN
INTRAVENOUS | Status: AC
  Filled 2017-03-03: qty 200

## 2017-03-03 MED ORDER — ACETAMINOPHEN 325 MG PO TABS: 650.0000 mg | ORAL_TABLET | ORAL | Status: DC | PRN

## 2017-03-03 MED ORDER — ACETAMINOPHEN 160 MG/5ML PO SOLN: 650.0000 mg | ORAL | Status: DC | PRN

## 2017-03-03 MED ORDER — ONDANSETRON HCL 4 MG/2ML IJ SOLN
INTRAMUSCULAR | Status: DC | PRN
  Administered 2017-03-03: 4 mg via INTRAVENOUS

## 2017-03-03 MED ORDER — STROKE: EARLY STAGES OF RECOVERY BOOK
Freq: Once | Status: AC
  Administered 2017-03-03: 16:00:00
  Filled 2017-03-03: qty 1

## 2017-03-03 MED ORDER — PROPOFOL 1000 MG/100ML IV EMUL
5.0000 ug/kg/min | INTRAVENOUS | Status: DC
  Administered 2017-03-03: 25 ug/kg/min via INTRAVENOUS
  Administered 2017-03-03: 15 ug/kg/min via INTRAVENOUS
  Administered 2017-03-03: 60 ug/kg/min via INTRAVENOUS
  Administered 2017-03-03: 50 ug/kg/min via INTRAVENOUS

## 2017-03-03 MED ORDER — PROPOFOL 500 MG/50ML IV EMUL
INTRAVENOUS | Status: DC | PRN
  Administered 2017-03-03: 100 ug/kg/min via INTRAVENOUS

## 2017-03-03 MED ORDER — ESMOLOL HCL 100 MG/10ML IV SOLN
INTRAVENOUS | Status: DC | PRN
  Administered 2017-03-03: 40 mg via INTRAVENOUS

## 2017-03-03 MED ORDER — EPHEDRINE SULFATE 50 MG/ML IJ SOLN
INTRAMUSCULAR | Status: DC | PRN
  Administered 2017-03-03: 10 mg via INTRAVENOUS

## 2017-03-03 MED ORDER — ASPIRIN 325 MG PO TABS
325.0000 mg | ORAL_TABLET | Freq: Every day | ORAL | Status: DC
  Administered 2017-03-04 – 2017-03-08 (×5): 325 mg via ORAL
  Filled 2017-03-03 (×7): qty 1

## 2017-03-03 NOTE — Anesthesia Postprocedure Evaluation (Signed)
Anesthesia Post Note  Patient: Omarion Aquirre  Procedure(s) Performed: Procedure(s) (LRB): RADIOLOGY WITH ANESTHESIA (N/A)     Patient location during evaluation: PACU Anesthesia Type: General Level of consciousness: awake and alert Pain management: pain level controlled Vital Signs Assessment: post-procedure vital signs reviewed and stable Respiratory status: spontaneous breathing, nonlabored ventilation, respiratory function stable and patient connected to nasal cannula oxygen Cardiovascular status: blood pressure returned to baseline and stable Postop Assessment: no signs of nausea or vomiting Anesthetic complications: no    Last Vitals:  Vitals:   03/03/17 1315 03/03/17 1330  BP:    Pulse: 71 72  Resp: 17 17  Temp:  36.7 C  SpO2: 98% 99%    Last Pain:  Vitals:   03/03/17 1315  PainSc: 0-No pain                 Islam Villescas DAVID
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
## 2017-03-03 NOTE — Consult Note (Signed)
PULMONARY / CRITICAL CARE MEDICINE   Name: Logan [name] MRN: [id number] DOB: 08/23/1975    ADMISSION DATE:  03/03/2017 CONSULTATION DATE:  03/03/2017  REFERRING MD: Titus Dubin  CHIEF COMPLAINT:  Airway management  HISTORY OF PRESENT ILLNESS:   41 yr old rt handed male with Hx of CAD,CHF?,EF 25 % and ? etoh . Last seen normal at   11pm last pm 8/30.Found with AMS and Lt sided weakness, left facial droop, slurred speech and Left sided gaze on  8/31 am  Premorbid mRSscore 0 . CT brain  NO ICH. Evolving RT SCA ischemic stroke. CTP . After reviewing findings with neurology, Patient meets the criteria for revascularization as per  the DAWN and Diffuse 3 trials . Pt. was taken for reperfusion 8/31 by Dr. Titus Dubin. Mechanical retrieval of clot from PCA with TC3 reperfusion. Pt. Was transferred to PACU for recovery. CCM has been asked to manage extubation .  PAST MEDICAL HISTORY :  He  has a past medical history of Anginal pain (HCC); CAD (coronary artery disease) (01/22/12); Chronic systolic CHF (congestive heart failure) (HCC); GERD (gastroesophageal reflux disease); Hyperlipidemia; Ischemic cardiomyopathy; LV (left ventricular) mural thrombus; Moderate mitral regurgitation; Myocardial infarction (HCC) (01/2012; 02/2012; 04/2012); Noncompliance; Stroke (HCC) (04/2014); Syncope; and Tobacco abuse.  PAST SURGICAL HISTORY: He  has a past surgical history that includes Tibia fracture surgery (Left, 2006); Coronary artery bypass graft (07/16/2012); Intraoprative transesophageal echocardiogram (07/16/2012); Cardiac defibrillator placement (03/28/2014); Fracture surgery; Coronary angioplasty with stent (01/22/12); Cardiac catheterization (07/09/2012); left heart catheterization with coronary angiogram (N/A, 01/22/2012); percutaneous coronary stent intervention (pci-s) (N/A, 01/22/2012); left heart catheterization with coronary angiogram (N/A, 07/09/2012); right heart catheterization (N/A, 07/12/2012); implantable  cardioverter defibrillator implant (N/A, 03/28/2014); and Cardiac catheterization (N/A, 06/26/2015).  Allergies  Allergen Reactions  . Adhesive [Tape] Rash    "paper tape is ok" (07/09/2012)  . Bee Venom Anaphylaxis  . Penicillins Rash    Has patient had a PCN reaction causing immediate rash, facial/tongue/throat swelling, SOB or lightheadedness with hypotension: Yes Has patient had a PCN reaction causing severe rash involving mucus membranes or skin necrosis: No Has patient had a PCN reaction that required hospitalization No Has patient had a PCN reaction occurring within the last 10 years: No If all of the above answers are "NO", then may proceed with Cephalosporin use.     No current facility-administered medications on file prior to encounter.    Current Outpatient Prescriptions on File Prior to Encounter  Medication Sig  . aspirin EC 81 MG tablet Take 1 tablet (81 mg total) by mouth daily.  Marland Kitchen atorvastatin (LIPITOR) 80 MG tablet Take 80 mg by mouth daily.  . carvedilol (COREG) 6.25 MG tablet Take 1 tablet (6.25 mg total) by mouth 2 (two) times daily with a meal.  . furosemide (LASIX) 20 MG tablet Take 20 mg by mouth daily.  . nitroGLYCERIN (NITROLINGUAL) 0.4 MG/SPRAY spray Place 1 spray under the tongue every 5 (five) minutes x 3 doses as needed for chest pain. (Patient not taking: Reported on 05/30/2016)  . rivaroxaban (XARELTO) 20 MG TABS tablet Take 1 tablet (20 mg total) by mouth daily with supper.  . sacubitril-valsartan (ENTRESTO) 24-26 MG Take 1 tablet by mouth 2 (two) times daily.  Marland Kitchen spironolactone (ALDACTONE) 25 MG tablet Take 25 mg by mouth daily.    FAMILY HISTORY:  His indicated that his mother is alive. He indicated that his father is alive. He indicated that the status of his sister is unknown. He indicated that his  brother is alive.    SOCIAL HISTORY: He  reports that he has been smoking Cigarettes.  He started smoking about 30 years ago. He has a 25.00 pack-year  smoking history. He has quit using smokeless tobacco. His smokeless tobacco use included Snuff. He reports that he drinks about 8.4 oz of alcohol per week . He reports that he uses drugs, including Marijuana.  REVIEW OF SYSTEMS:   Sedated and intubated. UTA  SUBJECTIVE:  41 year old male , sedated and intubated on stretcher in PACU. Oral ETT, on vent, full support. Hemodynamically stable  VITAL SIGNS: BP 120/76   Pulse 71   Temp (!) 97.5 F (36.4 C)   Resp 15   Wt 164 lb 14.5 oz (74.8 kg)   SpO2 100%   BMI 24.71 kg/m   HEMODYNAMICS:    VENTILATOR SETTINGS: Vent Mode: PRVC FiO2 (%):  [50 %] 50 % Set Rate:  [12 bmp] 12 bmp Vt Set:  [550 mL] 550 mL PEEP:  [5 cmH20] 5 cmH20 Plateau Pressure:  [14 cmH20] 14 cmH20  INTAKE / OUTPUT: No intake/output data recorded.  PHYSICAL EXAMINATION: General:  Middle aged male, sedated and intubated.  Neuro:  PERRLA, 2-3 mm HEENT:  Normocephalic, atraumatic, Oral ETT, OG tube Cardiovascular:  SR per monitor, RRR, No RMG Lungs: Clear to auscultation, over breathing set rate Abdomen: Non-distended, non-tender, BS diminished Musculoskeletal:  No obvious deformities  Skin:  Warm dry and intact, no lesions or rash, multiple tattoos  LABS:  BMET  Recent Labs Lab 03/03/17 0710 03/03/17 0718  NA 136 138  K 4.3 4.3  CL 103 101  CO2 25  --   BUN 13 15  CREATININE 0.93 0.80  GLUCOSE 155* 156*    Electrolytes  Recent Labs Lab 03/03/17 0710  CALCIUM 9.1    CBC  Recent Labs Lab 03/03/17 0710 03/03/17 0718  WBC 11.0*  --   HGB 14.5 15.0  HCT 41.3 44.0  PLT 260  --     Coag's  Recent Labs Lab 03/03/17 0710  APTT 26  INR 0.96    Sepsis Markers No results for input(s): LATICACIDVEN, PROCALCITON, O2SATVEN in the last 168 hours.  ABG No results for input(s): PHART, PCO2ART, PO2ART in the last 168 hours.  Liver Enzymes  Recent Labs Lab 03/03/17 0710  AST 28  ALT 23  ALKPHOS 76  BILITOT 0.6  ALBUMIN 3.6     Cardiac Enzymes No results for input(s): TROPONINI, PROBNP in the last 168 hours.  Glucose No results for input(s): GLUCAP in the last 168 hours.  Imaging Ct Angio Head W Or Wo Contrast  Result Date: 03/03/2017 CLINICAL DATA:  Left-sided weakness and hemianopia. EXAM: CT ANGIOGRAPHY HEAD AND NECK CT PERFUSION BRAIN TECHNIQUE: Multidetector CT imaging of the head and neck was performed using the standard protocol during bolus administration of intravenous contrast. Multiplanar CT image reconstructions and MIPs were obtained to evaluate the vascular anatomy. Carotid stenosis measurements (when applicable) are obtained utilizing NASCET criteria, using the distal internal carotid diameter as the denominator. Multiphase CT imaging of the brain was performed following IV bolus contrast injection. Subsequent parametric perfusion maps were calculated using RAPID software. CONTRAST:  100 cc Isovue 370 intravenous COMPARISON:  Head CT from earlier today. CTA of the head neck 01/07/2016 FINDINGS: CTA NECK FINDINGS Aortic arch: Partly seen changes of CABG. The visible saphenous grafts and upper LIMA are enhancing. No acute finding. Right carotid system: Minimal atheromatous changes at the ICA bulb. No  stenosis or ulceration. Left carotid system: Minimal atheromatous changes at the ICA bulb. No stenosis or ulceration. Negative for dissection. Vertebral arteries: No proximal subclavian stenosis. There is mild atheromatous change at the origin of the left subclavian. Both vertebral arteries are smooth and widely patent to the dura. Skeleton: No acute or aggressive finding. Other neck: No incidental mass or inflammation. Upper chest: Status post CABG. Subcutaneous defibrillator. Paraseptal emphysematous change. Review of the MIP images confirms the above findings CTA HEAD FINDINGS Anterior circulation: Mild atheromatous changes on the carotid siphons, but greater than expected for age. Negative for branch occlusion  or flow limiting stenosis. Negative for aneurysm. Posterior circulation: Mild right vertebral artery dominance. Despite infarct, right superior cerebellar artery is seen and has recannulized. There is right proximal P2 segment occlusion. Negative for aneurysm. Venous sinuses: Patent Anatomic variants: None significant Delayed phase: Not performed in the emergent setting Review of the MIP images confirms the above findings CT Brain Perfusion Findings: CBF (<30%) Volume: 13mL Perfusion (Tmax>6.0s) volume: 64 ccmL Mismatch Volume: 51 ccmL Infarction Location:Right occipital Case discussed with Dr. Amada Jupiter 03/03/2017  at 7:56 am. IMPRESSION: 1. Right proximal P2 occlusion. By cerebral perfusion there is 13 cc of right occipital infarct and 64 cc of ischemia - 51 cc penumbra. 2. Mild atheromatous changes. No flow limiting stenosis or embolic source identified more proximally. 3. Right superior cerebellar infarct. The right superior cerebellar artery has recannulized. Electronically Signed   By: Marnee Spring M.D.   On: 03/03/2017 08:00   Ct Head Wo Contrast  Result Date: 03/03/2017 CLINICAL DATA:  Post stroke intervention EXAM: CT HEAD WITHOUT CONTRAST TECHNIQUE: Contiguous axial images were obtained from the base of the skull through the vertex without intravenous contrast. COMPARISON:  03/03/2017 FINDINGS: Brain: No hemorrhage. No visible occipital infarct. The right superior cerebellar infarct is obscured postcontrast. Vascular: No asymmetry vessel density. Skull: Negative. Sinuses/Orbits: Intubation with nasopharyngeal fluid. No acute finding IMPRESSION: 1. No acute finding after right PCA revascularization. 2. A right superior cerebellar infarct is obscured due to enhancement. No visible occipital infarct. Electronically Signed   By: Marnee Spring M.D.   On: 03/03/2017 10:18   Ct Angio Neck W Or Wo Contrast  Result Date: 03/03/2017 CLINICAL DATA:  Left-sided weakness and hemianopia. EXAM: CT  ANGIOGRAPHY HEAD AND NECK CT PERFUSION BRAIN TECHNIQUE: Multidetector CT imaging of the head and neck was performed using the standard protocol during bolus administration of intravenous contrast. Multiplanar CT image reconstructions and MIPs were obtained to evaluate the vascular anatomy. Carotid stenosis measurements (when applicable) are obtained utilizing NASCET criteria, using the distal internal carotid diameter as the denominator. Multiphase CT imaging of the brain was performed following IV bolus contrast injection. Subsequent parametric perfusion maps were calculated using RAPID software. CONTRAST:  100 cc Isovue 370 intravenous COMPARISON:  Head CT from earlier today. CTA of the head neck 01/07/2016 FINDINGS: CTA NECK FINDINGS Aortic arch: Partly seen changes of CABG. The visible saphenous grafts and upper LIMA are enhancing. No acute finding. Right carotid system: Minimal atheromatous changes at the ICA bulb. No stenosis or ulceration. Left carotid system: Minimal atheromatous changes at the ICA bulb. No stenosis or ulceration. Negative for dissection. Vertebral arteries: No proximal subclavian stenosis. There is mild atheromatous change at the origin of the left subclavian. Both vertebral arteries are smooth and widely patent to the dura. Skeleton: No acute or aggressive finding. Other neck: No incidental mass or inflammation. Upper chest: Status post CABG. Subcutaneous defibrillator. Paraseptal emphysematous  change. Review of the MIP images confirms the above findings CTA HEAD FINDINGS Anterior circulation: Mild atheromatous changes on the carotid siphons, but greater than expected for age. Negative for branch occlusion or flow limiting stenosis. Negative for aneurysm. Posterior circulation: Mild right vertebral artery dominance. Despite infarct, right superior cerebellar artery is seen and has recannulized. There is right proximal P2 segment occlusion. Negative for aneurysm. Venous sinuses: Patent  Anatomic variants: None significant Delayed phase: Not performed in the emergent setting Review of the MIP images confirms the above findings CT Brain Perfusion Findings: CBF (<30%) Volume: 13mL Perfusion (Tmax>6.0s) volume: 64 ccmL Mismatch Volume: 51 ccmL Infarction Location:Right occipital Case discussed with Dr. Amada Jupiter 03/03/2017  at 7:56 am. IMPRESSION: 1. Right proximal P2 occlusion. By cerebral perfusion there is 13 cc of right occipital infarct and 64 cc of ischemia - 51 cc penumbra. 2. Mild atheromatous changes. No flow limiting stenosis or embolic source identified more proximally. 3. Right superior cerebellar infarct. The right superior cerebellar artery has recannulized. Electronically Signed   By: Marnee Spring M.D.   On: 03/03/2017 08:00   Ct Cerebral Perfusion W Contrast  Result Date: 03/03/2017 CLINICAL DATA:  Left-sided weakness and hemianopia. EXAM: CT ANGIOGRAPHY HEAD AND NECK CT PERFUSION BRAIN TECHNIQUE: Multidetector CT imaging of the head and neck was performed using the standard protocol during bolus administration of intravenous contrast. Multiplanar CT image reconstructions and MIPs were obtained to evaluate the vascular anatomy. Carotid stenosis measurements (when applicable) are obtained utilizing NASCET criteria, using the distal internal carotid diameter as the denominator. Multiphase CT imaging of the brain was performed following IV bolus contrast injection. Subsequent parametric perfusion maps were calculated using RAPID software. CONTRAST:  100 cc Isovue 370 intravenous COMPARISON:  Head CT from earlier today. CTA of the head neck 01/07/2016 FINDINGS: CTA NECK FINDINGS Aortic arch: Partly seen changes of CABG. The visible saphenous grafts and upper LIMA are enhancing. No acute finding. Right carotid system: Minimal atheromatous changes at the ICA bulb. No stenosis or ulceration. Left carotid system: Minimal atheromatous changes at the ICA bulb. No stenosis or ulceration.  Negative for dissection. Vertebral arteries: No proximal subclavian stenosis. There is mild atheromatous change at the origin of the left subclavian. Both vertebral arteries are smooth and widely patent to the dura. Skeleton: No acute or aggressive finding. Other neck: No incidental mass or inflammation. Upper chest: Status post CABG. Subcutaneous defibrillator. Paraseptal emphysematous change. Review of the MIP images confirms the above findings CTA HEAD FINDINGS Anterior circulation: Mild atheromatous changes on the carotid siphons, but greater than expected for age. Negative for branch occlusion or flow limiting stenosis. Negative for aneurysm. Posterior circulation: Mild right vertebral artery dominance. Despite infarct, right superior cerebellar artery is seen and has recannulized. There is right proximal P2 segment occlusion. Negative for aneurysm. Venous sinuses: Patent Anatomic variants: None significant Delayed phase: Not performed in the emergent setting Review of the MIP images confirms the above findings CT Brain Perfusion Findings: CBF (<30%) Volume: 13mL Perfusion (Tmax>6.0s) volume: 64 ccmL Mismatch Volume: 51 ccmL Infarction Location:Right occipital Case discussed with Dr. Amada Jupiter 03/03/2017  at 7:56 am. IMPRESSION: 1. Right proximal P2 occlusion. By cerebral perfusion there is 13 cc of right occipital infarct and 64 cc of ischemia - 51 cc penumbra. 2. Mild atheromatous changes. No flow limiting stenosis or embolic source identified more proximally. 3. Right superior cerebellar infarct. The right superior cerebellar artery has recannulized. Electronically Signed   By: Kathrynn Ducking.D.  On: 03/03/2017 08:00   Ct Head Code Stroke Wo Contrast  Result Date: 03/03/2017 CLINICAL DATA:  Code stroke. Left-sided weakness with neglect. Hemianopia. EXAM: CT HEAD WITHOUT CONTRAST TECHNIQUE: Contiguous axial images were obtained from the base of the skull through the vertex without intravenous  contrast. COMPARISON:  01/07/2016 FINDINGS: Brain: There is edema in the right superior cerebellar artery distribution. No cerebral infarct is seen. No acute hemorrhage, hydrocephalus, or masslike findings. Vascular: Hyperdense right P2 segment. Skull: No acute or aggressive finding Sinuses/Orbits: Negative Other: These results were called by telephone at the time of interpretation on 03/03/2017 at 7:27 am to Dr. Amada Jupiter , who verbally acknowledged these results. ASPECTS [facility] Stroke Program Early CT Score) Not scored in this setting. IMPRESSION: 1. Acute right superior cerebellar infarct. 2. Hyperdense right P2 without definitively seen occipital infarct. Electronically Signed   By: Marnee Spring M.D.   On: 03/03/2017 07:33     STUDIES:  03/03/2017>> CT Head>>: No acute finding after right PCA revascularization. A right superior cerebellar infarct is obscured due toenhancement. No visible occipital infarct.  CULTURES: None  ANTIBIOTICS: 03/03/2017>> Vanc 1 gram pre-op  SIGNIFICANT EVENTS: 03/02/2017>> Last seen normal 8/30 11 pm. 03/03/2017>> Right PCA Re-Vascularization LINES/TUBES: 03/03/2017>> ETT 03/03/2017>> L Radial A line 03/03/2017>> R AC large bore PIV 03/03/2017>> L Hand  Large bore PIV  DISCUSSION: Pt. With . Evolving RT SCA ischemic stroke. CTP . Taken for Mechanical retrieval of clot from PCA with TC3 reperfusion. CCM asked to assist with extubation post procedure.  ASSESSMENT / PLAN:  PULMONARY A: Intubation for surgical intervention Current Every day smoker Clear secretions  P:   CPAP/ PS now Extubate if tolerating after 30 minutes Titrate oxygen for saturations > 93% Elevate HOB as able( 6 hours posr procedure) Will need smoking cessation counseling as out patient Swallow study CXR now  CARDIOVASCULAR A:  Hx of CAD,CHF?,EF 25 %, SP CABG 2013 History of medical non-compliance P:  Cardene for BP goal of 120-140  Add home meds as able  RENAL A:   No  acute issues P:   BMET in am Replete electrolytes as needed.  GASTROINTESTINAL A:   No acute issues P:   NPO for now  HEMATOLOGIC A:   No acute issues P:  Monitor CBC Assess for bleeding PAS hose  INFECTIOUS A:   No acute issues P:   Monitor fever curve and WBC  ENDOCRINE A:   Hyperglycemia per CMET   P:   Follow Glucose per CMET CBG's Q 4 SSI id indicated HGB A1C   NEUROLOGIC A:   Hx. TIA, medical non-compliance with coumadin/ xaralto P:   RASS goal: 0-1 Neuro checks per floor protocol Anticoagulation per interventional neurology   FAMILY  - Updates: No family at bedside to update. Apparently he is estranged form wife, but she gave consent for procedure.  Awakening and following commands, successful extubated. Care per primary team - Inter-disciplinary family meet or Palliative Care meeting due by:  Sept. 6,2018.   Bevelyn Ngo, AGACNP-BC [facility] Pulmonary/Critical Care Medicine Pager # [phone] if no answer,  Pager: [phone]  03/03/2017, 11:15 AM

## 2017-03-03 NOTE — ED Triage Notes (Signed)
Pt brought in by [facility]. LSN 2300. L arm, L facial droop, L leg weakness, slurred speech, L visual field cut. Pt alert and oriented. Bp 152/86, HR 60 SR, CBG 173

## 2017-03-03 NOTE — Transfer of Care (Signed)
Immediate Anesthesia Transfer of Care Note  Patient: Logan [name]  Procedure(s) Performed: Procedure(s): RADIOLOGY WITH ANESTHESIA (N/A)  Patient Location: PACU  Anesthesia Type:General  Level of Consciousness: Patient remains intubated per anesthesia plan  Airway & Oxygen Therapy: Patient remains intubated per anesthesia plan and Patient placed on Ventilator (see vital sign flow sheet for setting)  Post-op Assessment: Report given to RN and Post -op Vital signs reviewed and stable  Post vital signs: Reviewed and stable  Last Vitals:  Vitals:   03/03/17 1010 03/03/17 1012  BP: 120/76 120/76  Pulse: 72 72  Resp: 15 12  Temp: (!) 36.4 C   SpO2: 100% 100%    Last Pain:  Vitals:   03/03/17 0757  PainSc: 6          Complications: No apparent anesthesia complications

## 2017-03-03 NOTE — Procedures (Signed)
Called to CT to place pt on vent, pt placed on settings per CRNA.  Pt then transported to PACU.

## 2017-03-03 NOTE — Anesthesia Procedure Notes (Signed)
Procedure Name: Intubation Date/Time: 03/03/2017 8:17 AM Performed by: Rejeana Brock L Pre-anesthesia Checklist: Patient identified, Emergency Drugs available, Suction available and Patient being monitored Patient Re-evaluated:Patient Re-evaluated prior to induction Oxygen Delivery Method: Circle System Utilized Preoxygenation: Pre-oxygenation with 100% oxygen Induction Type: IV induction Ventilation: Mask ventilation without difficulty Laryngoscope Size: Mac and 3 Grade View: Grade I Tube type: Subglottic suction tube Tube size: 8.0 mm Number of attempts: 1 Airway Equipment and Method: Stylet and Oral airway Placement Confirmation: ETT inserted through vocal cords under direct vision,  positive ETCO2 and breath sounds checked- equal and bilateral Secured at: 22 cm Tube secured with: Tape Dental Injury: Teeth and Oropharynx as per pre-operative assessment

## 2017-03-03 NOTE — H&P (Deleted)
Neurology H&P  CC: Left sided weakness  History is obtained from:patient  HPI: Logan [name] is a 41 y.o. male with a history of CAD, MI who presents with unsteadiness/hemianopia. He was in his normal state of health yesterday, but noticed that he was beginning to have some left sided weakness prior ot bed around 11pm. This morning, he felt significantly worse and therefore presents via EMS.     LKW: 11 pm 8/30 tpa given?: no, out of window Modified Rankin Score: 1  ROS: A 14 point ROS was performed and is negative except as noted in the HPI.   Past Medical History:  Diagnosis Date  . Anginal pain (HCC)   . CAD (coronary artery disease) 01/22/12   a.anterolateral MI with DES to LAD EF 25% b. reinfarction 8/13 with thrombosis of stent c. s/p CABG x 3: LIMA-LAD, SVG-OM, SVG-RCA (07/2012)   . Chronic systolic CHF (congestive heart failure) (HCC)    a. (07/2012) EF 20-25% diff HK with reg wall abnl, HK anterolat wall, akinesis inferosep, mod MR;  b. 01/2014 Echo: EF 10-15%, glob HK, mod MR.  Marland Kitchen GERD (gastroesophageal reflux disease)   . Hyperlipidemia   . Ischemic cardiomyopathy    a.  01/2014 Echo: EF 10-15%, glob HK;  b. 03/2014 S/P Subcutaneous ICD.  . LV (left ventricular) mural thrombus    "just found it in Oct" (07/09/2012)  . Moderate mitral regurgitation   . Myocardial infarction (HCC) 01/2012; 02/2012; 04/2012  . Noncompliance   . Stroke [facility]) 04/2014   TIA in the setting of medical non-compliance with coumadin requiring hospitalization.  . Syncope   . Tobacco abuse      Family History  Problem Relation Age of Onset  . Coronary artery disease Father        Premature CAD  . Heart disease Father   . Heart attack Father   . Coronary artery disease Brother        Premature CAD  . Heart attack Brother   . Heart disease Brother   . Heart disease Mother   . Hypertension Sister      Social History:  reports that he has been smoking Cigarettes.  He started smoking about 30  years ago. He has a 25.00 pack-year smoking history. He has quit using smokeless tobacco. His smokeless tobacco use included Snuff. He reports that he drinks about 8.4 oz of alcohol per week . He reports that he uses drugs, including Marijuana.   Exam: Current vital signs: BP 129/81 (BP Location: Right Arm)   Pulse (!) 59   Resp 15   SpO2 100%  Vital signs in last 24 hours: Pulse Rate:  [59] 59 (08/31 0745) Resp:  [15] 15 (08/31 0745) BP: (129)/(81) 129/81 (08/31 0745) SpO2:  [100 %] 100 % (08/31 0745)  Physical Exam  Constitutional: Appears well-developed and well-nourished.  Psych: Affect appropriate to situation Eyes: No scleral injection HENT: No OP obstrucion Head: Normocephalic.  Cardiovascular: Normal rate and regular rhythm.  Respiratory: Effort normal and breath sounds normal to anterior ascultation GI: Soft.  No distension. There is no tenderness.  Skin: WDI  Neuro: Mental Status: Patient is awake, alert, oriented to person, place, month, year, and situation. Patient is able to give a clear and coherent history. No signs of aphasia or neglect Cranial Nerves: II: He has a dense right hemianopia. Pupils are equal, round, and reactive to light.   III,IV, VI: EOMI without ptosis or diploplia.  V: Facial sensation is symmetric  to temperature VII: Facial movement is symmetric.  VIII: hearing is intact to voice X: Uvula elevates symmetrically XI: Shoulder shrug is symmetric. XII: tongue is midline without atrophy or fasciculations.  Motor: He has 2/5 left sided weakness, 5/5 on the right.  Sensory: Sensation is symmetric to light touch  in the arms and legs. Cerebellar: He has right sided ataxia in the arm and leg.    I have reviewed labs in epic and the results pertinent to this consultation are: cmp - unremarkable.   I have reviewed the images obtained: CTA/P- R PCA occlusion with large penumbra.   Impression: 41 yo M with posterior circulation infarct. I  suspect one embolus that broke up. He has a large penumbra in the PCA territory on the right and I advised  IR for mechanical thrombectomy. I suspect this is cardioembolic due to his chronic systolic heart failure from ischemic cardiomyopathy.   Recommendations: 1. Mechanical thrombectomy.  2. HgbA1c, fasting lipid panel 3. MRI  of the brain without contrast 4. Frequent neuro checks 5. Echocardiogram 6. Prophylactic therapy-Antiplatelet med: Aspirin - dose 325mg  PO or 300mg  PR 7. Risk factor modification 8. Telemetry monitoring 9. PT consult, OT consult, Speech consult 10. please page stroke NP  Or  PA  Or MD  from 8am -4 pm as this patient will be followed by the stroke team at this point.   You can look them up on [email or web address]     This patient is critically ill and at significant risk of neurological worsening, death and care requires constant monitoring of vital signs, hemodynamics,respiratory and cardiac monitoring, neurological assessment, discussion with family, other specialists and medical decision making of high complexity. I spent 65 minutes of neurocritical care time  in the care of  this patient.  Ritta Slot, MD [facility] [phone]  If 7pm- 7am, please page neurology on call as listed in AMION. 03/03/2017  8:00 AM

## 2017-03-03 NOTE — OR Nursing (Signed)
Weaning propofol for spontaneous weaning trial and neuro assessment.

## 2017-03-03 NOTE — Progress Notes (Signed)
Pt extubated per MD order. No distress or stridor noted.Positive cuff leak prior to extubation. Pt was able to stick out his tongue and lift head off bed as well.  Minimal snoring, but patient is laying flat, RN put patient In reverse trend position to elevate head lightly. Pt ventilation and oxygenating well. Sat 100% on 2L [place]. VS stable. Will cont to monitor

## 2017-03-03 NOTE — Anesthesia Preprocedure Evaluation (Signed)
Anesthesia Evaluation  Patient identified by MRN, date of birth, ID band Patient confused  Preop documentation limited or incomplete due to emergent nature of procedure.  Airway Mallampati: I  TM Distance: >3 FB Neck ROM: Full    Dental   Pulmonary Current Smoker,    Pulmonary exam normal        Cardiovascular + CAD, + Past MI and + CABG  Normal cardiovascular exam     Neuro/Psych TIACVA    GI/Hepatic GERD  Medicated and Controlled,  Endo/Other    Renal/GU      Musculoskeletal   Abdominal   Peds  Hematology   Anesthesia Other Findings   Reproductive/Obstetrics                             Anesthesia Physical Anesthesia Plan  ASA: III and emergent  Anesthesia Plan: General   Post-op Pain Management:    Induction: Intravenous, Rapid sequence and Cricoid pressure planned  PONV Risk Score and Plan: 1 and Ondansetron and Dexamethasone  Airway Management Planned: Oral ETT  Additional Equipment: Arterial line  Intra-op Plan:   Post-operative Plan: Post-operative intubation/ventilation  Informed Consent: I have reviewed the patients History and Physical, chart, labs and discussed the procedure including the risks, benefits and alternatives for the proposed anesthesia with the patient or authorized representative who has indicated his/her understanding and acceptance.     Plan Discussed with: CRNA and Surgeon  Anesthesia Plan Comments:         Anesthesia Quick Evaluation

## 2017-03-03 NOTE — Anesthesia Procedure Notes (Signed)
Arterial Line Insertion Start/End8/31/2018 8:03 AM Performed by: Edmonia Caprio, CRNA  Emergency situation Lidocaine 1% used for infiltration Left, radial was placed Catheter size: 20 G Hand hygiene performed  and maximum sterile barriers used  Allen's test indicative of satisfactory collateral circulation Attempts: 1 Procedure performed without using ultrasound guided technique. Following insertion, Biopatch and dressing applied. Post procedure assessment: normal  Patient tolerated the procedure well with no immediate complications.

## 2017-03-03 NOTE — ED Notes (Signed)
Pt transported to IR and report given. Pt shirt, shorts, belt, and all belongings in pockets, medications taken to IR in pt belonging bag. Bag given to RN in IR.

## 2017-03-03 NOTE — H&P (Signed)
Neurology H&P  CC: Left sided weakness  History is obtained from:patient  HPI: Logan [name] is a 41 y.o. male with a history of CAD, MI who presents with unsteadiness/hemianopia. He was in his normal state of health yesterday, but noticed that he was beginning to have some left sided weakness prior ot bed around 11pm. This morning, he felt significantly worse and therefore presents via EMS.     LKW: 11 pm 8/30 tpa given?: no, out of window Modified Rankin Score: 1  ROS: A 14 point ROS was performed and is negative except as noted in the HPI.   Past Medical History:  Diagnosis Date  . Anginal pain (HCC)   . CAD (coronary artery disease) 01/22/12   a.anterolateral MI with DES to LAD EF 25% b. reinfarction 8/13 with thrombosis of stent c. s/p CABG x 3: LIMA-LAD, SVG-OM, SVG-RCA (07/2012)   . Chronic systolic CHF (congestive heart failure) (HCC)    a. (07/2012) EF 20-25% diff HK with reg wall abnl, HK anterolat wall, akinesis inferosep, mod MR;  b. 01/2014 Echo: EF 10-15%, glob HK, mod MR.  Marland Kitchen GERD (gastroesophageal reflux disease)   . Hyperlipidemia   . Ischemic cardiomyopathy    a.  01/2014 Echo: EF 10-15%, glob HK;  b. 03/2014 S/P Subcutaneous ICD.  . LV (left ventricular) mural thrombus    "just found it in Oct" (07/09/2012)  . Moderate mitral regurgitation   . Myocardial infarction (HCC) 01/2012; 02/2012; 04/2012  . Noncompliance   . Stroke [facility]) 04/2014   TIA in the setting of medical non-compliance with coumadin requiring hospitalization.  . Syncope   . Tobacco abuse      Family History  Problem Relation Age of Onset  . Coronary artery disease Father        Premature CAD  . Heart disease Father   . Heart attack Father   . Coronary artery disease Brother        Premature CAD  . Heart attack Brother   . Heart disease Brother   . Heart disease Mother   . Hypertension Sister      Social History:  reports that he has been smoking Cigarettes.  He started smoking about 30  years ago. He has a 25.00 pack-year smoking history. He has quit using smokeless tobacco. His smokeless tobacco use included Snuff. He reports that he drinks about 8.4 oz of alcohol per week . He reports that he uses drugs, including Marijuana.   Exam: Current vital signs: BP 129/81 (BP Location: Right Arm)   Pulse (!) 59   Resp 15   SpO2 100%  Vital signs in last 24 hours: Pulse Rate:  [59] 59 (08/31 0745) Resp:  [15] 15 (08/31 0745) BP: (129)/(81) 129/81 (08/31 0745) SpO2:  [100 %] 100 % (08/31 0745)  Physical Exam  Constitutional: Appears well-developed and well-nourished.  Psych: Affect appropriate to situation Eyes: No scleral injection HENT: No OP obstrucion Head: Normocephalic.  Cardiovascular: Normal rate and regular rhythm.  Respiratory: Effort normal and breath sounds normal to anterior ascultation GI: Soft.  No distension. There is no tenderness.  Skin: WDI  Neuro: Mental Status: Patient is awake, alert, oriented to person, place, month, year, and situation. Patient is able to give a clear and coherent history. No signs of aphasia or neglect Cranial Nerves: II: He has a dense right hemianopia. Pupils are equal, round, and reactive to light.   III,IV, VI: EOMI without ptosis or diploplia.  V: Facial sensation is symmetric  to temperature VII: Facial movement is symmetric.  VIII: hearing is intact to voice X: Uvula elevates symmetrically XI: Shoulder shrug is symmetric. XII: tongue is midline without atrophy or fasciculations.  Motor: He has 2/5 left sided weakness, 5/5 on the right.  Sensory: Sensation is symmetric to light touch  in the arms and legs. Cerebellar: He has right sided ataxia in the arm and leg.    I have reviewed labs in epic and the results pertinent to this consultation are: cmp - unremarkable.   I have reviewed the images obtained: CTA/P- R PCA occlusion with large penumbra.   Impression: 41 yo M with posterior circulation infarct. I  suspect one embolus that borke up. He has a large penumbra in the PCA territory on the right and I advised  IR for mechanical thrombectomy. I suspect this is cardioembolic due to his chronic systolic heart failure from ischemic cardiomyopathy.   Recommendations: 1. Mechanical thrombectomy.  2. HgbA1c, fasting lipid panel 3. MRI  of the brain without contrast 4. Frequent neuro checks 5. Echocardiogram 6. Prophylactic therapy-Antiplatelet med: Aspirin - dose 325mg  PO or 300mg  PR, holding xarelto for now.  7. Risk factor modification 8. Telemetry monitoring 9. PT consult, OT consult, Speech consult 10. please page stroke NP  Or  PA  Or MD  from 8am -4 pm as this patient will be followed by the stroke team at this point.   You can look them up on [email or web address]     This patient is critically ill and at significant risk of neurological worsening, death and care requires constant monitoring of vital signs, hemodynamics,respiratory and cardiac monitoring, neurological assessment, discussion with family, other specialists and medical decision making of high complexity. I spent 65 minutes of neurocritical care time  in the care of  this patient.  Ritta Slot, MD [facility] [phone]  If 7pm- 7am, please page neurology on call as listed in AMION. 03/03/2017  8:00 AM

## 2017-03-03 NOTE — Procedures (Signed)
S/P RT common carotid areriogramand RT Vert artery angiogram followed by complete revascularization  of occluded RT PCA P1 seg with x1 pass with solitaire 13mm x 40 mm retriever device  with reestablishment if TICI 3 reperfusion.Logan [name]

## 2017-03-03 NOTE — Progress Notes (Signed)
Patient ID: Logan [name], male   DOB: 20-Mar-1976, 41 y.o.   MRN: [id number] 41 yr old rt handed male with Hx of CAD,CHF?,EF 25 % and ? etoh .LSW  11pm last night. Found with AMS and Lt sided weakness this am  Premorbid mRSscore 0 CT brain  NO ICH. Evolving RT SCA ischemic stroke. CTP . CBF< 30 % 13 ml Tmax >6 secs vol 64 ml . Mismatch 51 ml .Mismatch ratio 4.9 CTA  .RT P1 occlusion .  No Pcoms.  Findings reviewed with neurologist. Patient meets the criteria for revascularization as per  the DAWN and Diffuse 3 trials .  Option discussed with patients wife .Reasons ,risks of ICH of approx 10 % ,worsening neuro deficits ,vent dependency death and inability to revascularize all discussed. Informed witnessed consent obtained over the phone., for endovascular revascularization under [place]. S.Osiris Charles MD

## 2017-03-04 ENCOUNTER — Inpatient Hospital Stay (HOSPITAL_COMMUNITY): Payer: Medicare Other

## 2017-03-04 DIAGNOSIS — J9600 Acute respiratory failure, unspecified whether with hypoxia or hypercapnia: Secondary | ICD-10-CM

## 2017-03-04 DIAGNOSIS — I63119 Cerebral infarction due to embolism of unspecified vertebral artery: Secondary | ICD-10-CM

## 2017-03-04 DIAGNOSIS — I340 Nonrheumatic mitral (valve) insufficiency: Secondary | ICD-10-CM

## 2017-03-04 LAB — CBC WITH DIFFERENTIAL/PLATELET
Basophils Absolute: 0 10*3/uL (ref 0.0–0.1)
Basophils Relative: 0 %
EOS ABS: 0 10*3/uL (ref 0.0–0.7)
Eosinophils Relative: 0 %
HEMATOCRIT: 38.6 % — AB (ref 39.0–52.0)
Hemoglobin: 13.1 g/dL (ref 13.0–17.0)
LYMPHS ABS: 2.7 10*3/uL (ref 0.7–4.0)
Lymphocytes Relative: 27 %
MCH: 33.2 pg (ref 26.0–34.0)
MCHC: 33.9 g/dL (ref 30.0–36.0)
MCV: 97.7 fL (ref 78.0–100.0)
MONOS PCT: 6 %
Monocytes Absolute: 0.6 10*3/uL (ref 0.1–1.0)
NEUTROS PCT: 67 %
Neutro Abs: 6.6 10*3/uL (ref 1.7–7.7)
Platelets: 254 10*3/uL (ref 150–400)
RBC: 3.95 MIL/uL — ABNORMAL LOW (ref 4.22–5.81)
RDW: 13.7 % (ref 11.5–15.5)
WBC: 10 10*3/uL (ref 4.0–10.5)

## 2017-03-04 LAB — BASIC METABOLIC PANEL
Anion gap: 6 (ref 5–15)
BUN: 7 mg/dL (ref 6–20)
CHLORIDE: 109 mmol/L (ref 101–111)
CO2: 23 mmol/L (ref 22–32)
CREATININE: 0.65 mg/dL (ref 0.61–1.24)
Calcium: 8.6 mg/dL — ABNORMAL LOW (ref 8.9–10.3)
GFR calc Af Amer: >60 mL/min (ref >60)
GFR calc non Af Amer: >60 mL/min (ref >60)
Glucose, Bld: 87 mg/dL (ref 65–99)
Potassium: 3.7 mmol/L (ref 3.5–5.1)
SODIUM: 138 mmol/L (ref 135–145)

## 2017-03-04 LAB — LIPID PANEL
CHOL/HDL RATIO: 4.5 ratio
CHOLESTEROL: 174 mg/dL (ref 0–200)
HDL: 39 mg/dL — ABNORMAL LOW (ref >40)
LDL Cholesterol: 113 mg/dL — ABNORMAL HIGH (ref 0–99)
TRIGLYCERIDES: 110 mg/dL (ref <150)
VLDL: 22 mg/dL (ref 0–40)

## 2017-03-04 LAB — HEMOGLOBIN A1C
Hgb A1c MFr Bld: 5.1 % (ref 4.8–5.6)
MEAN PLASMA GLUCOSE: 99.67 mg/dL

## 2017-03-04 LAB — BRAIN NATRIURETIC PEPTIDE: B Natriuretic Peptide: 462.7 pg/mL — ABNORMAL HIGH (ref 0.0–100.0)

## 2017-03-04 LAB — PROTIME-INR
INR: 1.01
Prothrombin Time: 13.2 seconds (ref 11.4–15.2)

## 2017-03-04 LAB — ECHOCARDIOGRAM COMPLETE
HEIGHTINCHES: 68 in
WEIGHTICAEL: 2507.95 [oz_av]

## 2017-03-04 LAB — HIV ANTIBODY (ROUTINE TESTING W REFLEX): HIV Screen 4th Generation wRfx: NONREACTIVE

## 2017-03-04 LAB — PHOSPHORUS: Phosphorus: 3.1 mg/dL (ref 2.5–4.6)

## 2017-03-04 LAB — MAGNESIUM: Magnesium: 1.9 mg/dL (ref 1.7–2.4)

## 2017-03-04 MED ORDER — PERFLUTREN LIPID MICROSPHERE
1.0000 mL | INTRAVENOUS | Status: AC | PRN
  Administered 2017-03-04: 3 mL via INTRAVENOUS
  Filled 2017-03-04: qty 10

## 2017-03-04 NOTE — Progress Notes (Signed)
  Echocardiogram 2D Echocardiogram with definity has been performed.  Leta Jungling M 03/04/2017, 8:53 AM

## 2017-03-04 NOTE — Evaluation (Signed)
Occupational Therapy Evaluation Patient Details Name: Logan [name] MRN: [id number] DOB: Dec 12, 1975 Today's Date: 03/04/2017    History of Present Illness Patient is a 41 yr old male admitted 03/03/17 with AMS and Lt sided weakness, left facial droop, slurred speech and Left sided gaze.  Patient with evolving RT SCA ischemic stroke.  Patient s/p revascularization in IR.    PMH:    Anginal pain; CAD; Chronic systolic CHF; Hyperlipidemia; Ischemic cardiomyopathy; LV mural thrombus; Moderate mitral regurgitation; Myocardial infarction; Noncompliance; Stroke; Syncope; and Tobacco abuse; CABG   Clinical Impression   This 41 yo male admitted with above presents to acute OT with looking pretty good until he gets up on his feet demonstrating decreased balance thus affecting all of his basic ADLs and IADls. He would greatly benefit from follow up OT on CIR to get to a S level or better to return home with family.    Follow Up Recommendations  CIR;Supervision/Assistance - 24 hour    Equipment Recommendations  3 in 1 bedside commode       Precautions / Restrictions Precautions Precautions: Fall Restrictions Weight Bearing Restrictions: No      Mobility Bed Mobility Overal bed mobility: Needs Assistance Bed Mobility: Supine to Sit     Supine to sit: Min guard     General bed mobility comments: Assist for safety.  Transfers Overall transfer level: Needs assistance Equipment used: 2 person hand held assist Transfers: Sit to/from Stand Sit to Stand: Min assist;+2 physical assistance;+2 safety/equipment         General transfer comment: Assist to power up to stance and maintain balance.  Patient requiring UE support to maintain static balance    Balance Overall balance assessment: Needs assistance         Standing balance support: Bilateral upper extremity supported Standing balance-Leahy Scale: Poor Standing balance comment: Requires UE support and external assist to prevent  fall.                           ADL either performed or assessed with clinical judgement   ADL Overall ADL's : Needs assistance/impaired Eating/Feeding: Independent;Sitting     Grooming Details (indicate cue type and reason): Mod A for standing balance Upper Body Bathing: Set up;Sitting     Lower Body Bathing Details (indicate cue type and reason): Mod A for standing balance Upper Body Dressing : Set up;Sitting     Lower Body Dressing Details (indicate cue type and reason): Mod A for standing balance Toilet Transfer: Moderate assistance;Ambulation;RW Toilet Transfer Details (indicate cue type and reason): bed>around to recliner   Toileting - Clothing Manipulation Details (indicate cue type and reason): Mod A for standing balance             Vision Baseline Vision/History: Wears glasses Wears Glasses:  (says he should have some for far away and at night, but does not have any) Patient Visual Report: No change from baseline Vision Assessment?: Yes Eye Alignment: Within Functional Limits Ocular Range of Motion: Within Functional Limits Alignment/Gaze Preference: Within Defined Limits Tracking/Visual Pursuits: Able to track stimulus in all quads without difficulty Saccades: Within functional limits Additional Comments: Pt appears to be unable to see in left superior quadrant of right eye            Pertinent Vitals/Pain Pain Assessment: 0-10 Pain Score: 7  Pain Location: Head Pain Descriptors / Indicators: Headache Pain Intervention(s): Monitored during session     Hand Dominance Right  Extremity/Trunk Assessment Upper Extremity Assessment Upper Extremity Assessment: Overall WFL for tasks assessed   Lower Extremity Assessment Lower Extremity Assessment: LLE deficits/detail LLE Deficits / Details: Patient with strength at least 4+/5.  Decreased coordination. LLE Coordination: decreased gross motor   Cervical / Trunk Assessment Cervical / Trunk  Assessment: Normal   Communication Communication Communication: No difficulties   Cognition Arousal/Alertness: Awake/alert Behavior During Therapy: WFL for tasks assessed/performed Overall Cognitive Status: Within Functional Limits for tasks assessed                                                Home Living Family/patient expects to be discharged to:: Private residence Living Arrangements: Other relatives (brother-in law) Available Help at Discharge: Family;Available 24 hours/day Type of Home: House Home Access: Stairs to enter [employer] of Steps: 1 Entrance Stairs-Rails: Left Home Layout: One level     Bathroom Shower/Tub: Walk-in shower;Door   [employer] Toilet: Standard     Home Equipment: None          Prior Functioning/Environment Level of Independence: Independent        Comments: does not work or drive        OT Problem List: Impaired balance (sitting and/or standing);Impaired vision/perception;Pain      OT Treatment/Interventions: Self-care/ADL training;Therapeutic activities;Visual/perceptual remediation/compensation;Patient/family education;DME and/or AE instruction;Balance training    OT Goals(Current goals can be found in the care plan section) Acute Rehab OT Goals Patient Stated Goal: To walk OT Goal Formulation: With patient Time For Goal Achievement: 03/18/17 Potential to Achieve Goals: Good  OT Frequency: Min 3X/week           Co-evaluation PT/OT/SLP Co-Evaluation/Treatment: Yes Reason for Co-Treatment: For patient/therapist safety;To address functional/ADL transfers PT goals addressed during session: Mobility/safety with mobility OT goals addressed during session: ADL's and self-care;Strengthening/ROM      AM-PAC PT "6 Clicks" Daily Activity     Outcome Measure Help from another person eating meals?: None Help from another person taking care of personal grooming?: A Little Help from another person  toileting, which includes using toliet, bedpan, or urinal?: A Lot Help from another person bathing (including washing, rinsing, drying)?: A Little Help from another person to put on and taking off regular upper body clothing?: A Little Help from another person to put on and taking off regular lower body clothing?: A Lot 6 Click Score: 17   End of Session Equipment Utilized During Treatment: Gait belt;Rolling walker Nurse Communication: Mobility status  Activity Tolerance: Patient tolerated treatment well Patient left: in chair;with call bell/phone within reach;with chair alarm set  OT Visit Diagnosis: Unsteadiness on feet (R26.81);Other abnormalities of gait and mobility (R26.89);Muscle weakness (generalized) (M62.81);Pain Pain - part of body:  (headache)                Time: 4503-8882 OT Time Calculation (min): 25 min Charges:  OT General Charges $OT Visit: 1 Visit OT Evaluation $OT Eval Moderate Complexity: [address], [place] [phone] 03/04/2017
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
## 2017-03-04 NOTE — Progress Notes (Signed)
STROKE TEAM PROGRESS NOTE   HISTORY OF PRESENT ILLNESS (per record) Logan [name] is a 41 y.o. male with a history of CAD and MI who presents with unsteadiness/hemianopia. He was in his normal state of health yesterday, but noticed that he was beginning to have some left sided weakness prior ot bed around 11pm. This morning, he felt significantly worse and therefore presents via EMS.   LKW: 11 pm 8/30 tpa given?: no, out of window Modified Rankin Score: 1   SUBJECTIVE (INTERVAL HISTORY)    OBJECTIVE Temp:  [97.5 F (36.4 C)-98.8 F (37.1 C)] 98.4 F (36.9 C) (09/01 0400) Pulse Rate:  [56-88] 84 (09/01 0700) Cardiac Rhythm: Normal sinus rhythm (08/31 2000) Resp:  [7-21] 20 (09/01 0700) BP: (97-129)/(53-81) 123/69 (09/01 0700) SpO2:  [95 %-100 %] 95 % (09/01 0700) Arterial Line BP: (99-168)/(53-71) 136/65 (09/01 0700) FiO2 (%):  [40 %-50 %] 40 % (08/31 1135) Weight:  [156 lb 12 oz (71.1 kg)-164 lb 14.5 oz (74.8 kg)] 156 lb 12 oz (71.1 kg) (08/31 1400)  Examination: Cardiovascular: Normal rate and regular rhythm.  Respiratory: Effort normal and breath sounds normal to anterior ascultation GI: Soft.  No distension. There is no tenderness.   Neuro: Mental Status: Patient is awake, alert, oriented to person, place, month, year, and situation. Patient is able to give a clear and coherent history. No signs of aphasia or neglect Cranial Nerves: II: He has a dense right hemianopia. Pupils are equal, round, and reactive to light.   III,IV, VI: EOMI without ptosis or diploplia.  V: Facial sensation is symmetric to temperature VII: Facial movement is symmetric.  VIII: hearing is intact to voice X: Uvula elevates symmetrically XI: Shoulder shrug is symmetric. XII: tongue is midline without atrophy or fasciculations.  Motor: He has 2/5 left sided weakness, 5/5 on the right.  Sensory: Sensation is symmetric to light touch  in the arms and legs. Cerebellar: He has right sided  ataxia in the arm and leg.  CBC:  Recent Labs Lab 03/03/17 0710 03/03/17 0718 03/04/17 0429  WBC 11.0*  --  10.0  NEUTROABS 9.4*  --  6.6  HGB 14.5 15.0 13.1  HCT 41.3 44.0 38.6*  MCV 97.9  --  97.7  PLT 260  --  254    Basic Metabolic Panel:  Recent Labs Lab 03/03/17 0710 03/03/17 0718 03/04/17 0429  NA 136 138 138  K 4.3 4.3 3.7  CL 103 101 109  CO2 25  --  23  GLUCOSE 155* 156* 87  BUN 13 15 7   CREATININE 0.93 0.80 0.65  CALCIUM 9.1  --  8.6*  MG  --   --  1.9  PHOS  --   --  3.1    Lipid Panel:    Component Value Date/Time   CHOL 174 03/04/2017 0430   TRIG 110 03/04/2017 0430   HDL 39 (L) 03/04/2017 0430   CHOLHDL 4.5 03/04/2017 0430   VLDL 22 03/04/2017 0430   LDLCALC 113 (H) 03/04/2017 0430   HgbA1c:  Lab Results  Component Value Date   HGBA1C 5.1 03/04/2017   Urine Drug Screen:    Component Value Date/Time   LABOPIA NONE DETECTED 03/03/2017 1625   COCAINSCRNUR NONE DETECTED 03/03/2017 1625   COCAINSCRNUR NEGATIVE 01/23/2012 1227   LABBENZ NONE DETECTED 03/03/2017 1625   LABBENZ NEGATIVE 01/23/2012 1227   AMPHETMU NONE DETECTED 03/03/2017 1625   THCU POSITIVE (A) 03/03/2017 1625   LABBARB NONE DETECTED 03/03/2017 1625  Alcohol Level     Component Value Date/Time   ETH <5 01/06/2016 1920    IMAGING  Ct Angio Head W Or Wo Contrast  Ct Angio Neck W Or Wo Contrast Ct Cerebral Perfusion W Contrast 03/03/2017 1. Right proximal P2 occlusion.   By cerebral perfusion there is 13 cc of right occipital infarct and 64 cc of ischemia - 51 cc penumbra.  2. Mild atheromatous changes. No flow limiting stenosis or embolic source identified more proximally.  3. Right superior cerebellar infarct. The right superior cerebellar artery has recannulized.     Ct Head Wo Contrast 03/03/2017 1. No acute finding after right PCA revascularization.  2. A right superior cerebellar infarct is obscured due to enhancement. No visible occipital infarct.     Dg  Chest Port 1 View 03/03/2017 1. No radiographic evidence of acute cardiopulmonary disease.  2. Worsening cardiomegaly.  3. Aortic atherosclerosis.    Ct Head Code Stroke Wo Contrast 03/03/2017 1. Acute right superior cerebellar infarct.  2. Hyperdense right P2 without definitively seen occipital infarct.    Interventional Neuro Radiology S/P RT common carotid areriogramand RT Vert artery angiogram followed by complete revascularization  of occluded RT PCA P1 seg with x1 pass with solitaire 4mm x 40 mm retriever device  with reestablishment if TICI 3 reperfusion.     PHYSICAL EXAM Vitals:   03/04/17 0400 03/04/17 0500 03/04/17 0600 03/04/17 0700  BP: 108/70 117/76 119/79 123/69  Pulse: (!) 58 (!) 57 60 84  Resp: 16 17 16 20   Temp: 98.4 F (36.9 C)     TempSrc: Oral     SpO2: 97% 96% 96% 95%  Weight:      Height:             ASSESSMENT/PLAN [name] is a 41 y.o. male with history of tobacco use, previous stroke, medical noncompliance, coronary artery disease with MI, moderate mitral regurgitation, left ventricular mural thrombus in 2014, ischemic cardiomyopathy, Xarelto therapy, hyperlipidemia, and congestive heart failure presenting with left-sided weakness, unsteadiness, and hemianopia. He did not receive IV t-PA due to late presentation.  Interventional Neuro Radiology Complete revascularization of occluded RT PCA P1 seg with solitaire device and reestablishment if TICI 3 reperfusion.  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  Resultant   CT head - right superior cerebellar infarct.   MRI head - pending  MRA head - not performed  Carotid Doppler - CTA H&N  2D Echo - pending  LDL - 113  HgbA1c - 5.1  VTE prophylaxis - SCDs  Diet clear liquid Room service appropriate? Yes; Fluid consistency: Thin  aspirin 81 mg daily and Xarelto (rivaroxaban) daily prior to admission, now on aspirin 325 mg  daily  Patient counseled to be compliant with his antithrombotic medications  Ongoing aggressive stroke risk factor management  Therapy recommendations: pending  Disposition:  Pending  Hypertension  Stable  Permissive hypertension (OK if < 220/120) but gradually normalize in 5-7 days  Long-term BP goal normotensive  Hyperlipidemia  Home meds: No lipid lowering medications prior to admission  LDL 113, goal < 70  Now on Lipitor 80 mg daily  Continue statin at discharge   Other Stroke Risk Factors  Cigarette smoker - advised to stop smoking  ETOH use, advised to drink no more than 1 - 2 drink per day  Hx stroke/TIA  Coronary artery disease  Cardiomyopathy   Other Active Problems  UDS - THC  History of  ischemic cardiomyopathy and left ventricular mural thrombus in 2014.  History of medical noncompliance  Restart Xarelto soon  Hospital day # 1    To contact Stroke Continuity provider, please refer to [email or web address]. After hours, contact General Neurology

## 2017-03-04 NOTE — Progress Notes (Signed)
PULMONARY / CRITICAL CARE MEDICINE   Name: Logan [name] MRN: [id number] DOB: 08/23/1975    ADMISSION DATE:  03/03/2017 CONSULTATION DATE:  03/03/2017  REFERRING MD: Titus Dubin  CHIEF COMPLAINT:  Airway management  HISTORY OF PRESENT ILLNESS:   41 yr old rt handed male with Hx of CAD,CHF?,EF 25 % and ? etoh . Last seen normal at   11pm last pm 8/30.Found with AMS and Lt sided weakness, left facial droop, slurred speech and Left sided gaze on  8/31 am  Premorbid mRSscore 0 . CT brain  NO ICH. Evolving RT SCA ischemic stroke. CTP . After reviewing findings with neurology, Patient meets the criteria for revascularization as per  the DAWN and Diffuse 3 trials . Pt. was taken for reperfusion 8/31 by Dr. Titus Dubin. Mechanical retrieval of clot from PCA with TC3 reperfusion. Pt. Was transferred to PACU for recovery. CCM has been asked to manage extubation .  PAST MEDICAL HISTORY :  He  has a past medical history of Anginal pain (HCC); CAD (coronary artery disease) (01/22/12); Chronic systolic CHF (congestive heart failure) (HCC); GERD (gastroesophageal reflux disease); Hyperlipidemia; Ischemic cardiomyopathy; LV (left ventricular) mural thrombus; Moderate mitral regurgitation; Myocardial infarction (HCC) (01/2012; 02/2012; 04/2012); Noncompliance; Stroke (HCC) (04/2014); Syncope; and Tobacco abuse.  PAST SURGICAL HISTORY: He  has a past surgical history that includes Tibia fracture surgery (Left, 2006); Coronary artery bypass graft (07/16/2012); Intraoprative transesophageal echocardiogram (07/16/2012); Cardiac defibrillator placement (03/28/2014); Fracture surgery; Coronary angioplasty with stent (01/22/12); Cardiac catheterization (07/09/2012); left heart catheterization with coronary angiogram (N/A, 01/22/2012); percutaneous coronary stent intervention (pci-s) (N/A, 01/22/2012); left heart catheterization with coronary angiogram (N/A, 07/09/2012); right heart catheterization (N/A, 07/12/2012); implantable  cardioverter defibrillator implant (N/A, 03/28/2014); and Cardiac catheterization (N/A, 06/26/2015).  Allergies  Allergen Reactions  . Adhesive [Tape] Rash    "paper tape is ok" (07/09/2012)  . Bee Venom Anaphylaxis  . Penicillins Rash    Has patient had a PCN reaction causing immediate rash, facial/tongue/throat swelling, SOB or lightheadedness with hypotension: Yes Has patient had a PCN reaction causing severe rash involving mucus membranes or skin necrosis: No Has patient had a PCN reaction that required hospitalization No Has patient had a PCN reaction occurring within the last 10 years: No If all of the above answers are "NO", then may proceed with Cephalosporin use.     No current facility-administered medications on file prior to encounter.    Current Outpatient Prescriptions on File Prior to Encounter  Medication Sig  . aspirin EC 81 MG tablet Take 1 tablet (81 mg total) by mouth daily.  Marland Kitchen atorvastatin (LIPITOR) 80 MG tablet Take 80 mg by mouth daily.  . carvedilol (COREG) 6.25 MG tablet Take 1 tablet (6.25 mg total) by mouth 2 (two) times daily with a meal.  . furosemide (LASIX) 20 MG tablet Take 20 mg by mouth daily.  . nitroGLYCERIN (NITROLINGUAL) 0.4 MG/SPRAY spray Place 1 spray under the tongue every 5 (five) minutes x 3 doses as needed for chest pain. (Patient not taking: Reported on 05/30/2016)  . rivaroxaban (XARELTO) 20 MG TABS tablet Take 1 tablet (20 mg total) by mouth daily with supper.  . sacubitril-valsartan (ENTRESTO) 24-26 MG Take 1 tablet by mouth 2 (two) times daily.  Marland Kitchen spironolactone (ALDACTONE) 25 MG tablet Take 25 mg by mouth daily.    FAMILY HISTORY:  His indicated that his mother is alive. He indicated that his father is alive. He indicated that the status of his sister is unknown. He indicated that his  brother is alive.    SOCIAL HISTORY: He  reports that he has been smoking Cigarettes.  He started smoking about 30 years ago. He has a 25.00 pack-year  smoking history. He has quit using smokeless tobacco. His smokeless tobacco use included Snuff. He reports that he drinks about 8.4 oz of alcohol per week . He reports that he uses drugs, including Marijuana.  REVIEW OF SYSTEMS:   Sedated and intubated. UTA  SUBJECTIVE:  Extubated, stable  VITAL SIGNS: BP 123/69   Pulse 84   Temp 98.4 F (36.9 C) (Oral)   Resp 20   Ht 5\' 8"  (1.727 m)   Wt 156 lb 12 oz (71.1 kg)   SpO2 95%   BMI 23.83 kg/m   HEMODYNAMICS:    VENTILATOR SETTINGS: Vent Mode: CPAP;PSV FiO2 (%):  [40 %-50 %] 40 % Set Rate:  [12 bmp] 12 bmp Vt Set:  [550 mL] 550 mL PEEP:  [5 cmH20] 5 cmH20 Pressure Support:  [5 cmH20] 5 cmH20 Plateau Pressure:  [14 cmH20] 14 cmH20  INTAKE / OUTPUT: I/O last 3 completed shifts: In: 3099.3 [P.O.:240; I.V.:2620.4; IV Piggyback:238.8] Out: 3685 [Urine:3675; Blood:10]  PHYSICAL EXAMINATION: Gen:      No acute distress HEENT:  EOMI, sclera anicteric Neck:     No masses; no thyromegaly Lungs:    Clear to auscultation bilaterally; normal respiratory effort CV:         Regular rate and rhythm; no murmurs Abd:      + bowel sounds; soft, non-tender; no palpable masses, no distension Ext:    No edema; adequate peripheral perfusion Skin:      Warm and dry; no rash Neuro: alert and oriented x 3 Psych: normal mood and affect  LABS:  BMET  Recent Labs Lab 03/03/17 0710 03/03/17 0718 03/04/17 0429  NA 136 138 138  K 4.3 4.3 3.7  CL 103 101 109  CO2 25  --  23  BUN 13 15 7   CREATININE 0.93 0.80 0.65  GLUCOSE 155* 156* 87    Electrolytes  Recent Labs Lab 03/03/17 0710 03/04/17 0429  CALCIUM 9.1 8.6*  MG  --  1.9  PHOS  --  3.1    CBC  Recent Labs Lab 03/03/17 0710 03/03/17 0718 03/04/17 0429  WBC 11.0*  --  10.0  HGB 14.5 15.0 13.1  HCT 41.3 44.0 38.6*  PLT 260  --  254    Coag's  Recent Labs Lab 03/03/17 0710  APTT 26  INR 0.96    Sepsis Markers No results for input(s): LATICACIDVEN,  PROCALCITON, O2SATVEN in the last 168 hours.  ABG No results for input(s): PHART, PCO2ART, PO2ART in the last 168 hours.  Liver Enzymes  Recent Labs Lab 03/03/17 0710  AST 28  ALT 23  ALKPHOS 76  BILITOT 0.6  ALBUMIN 3.6    Cardiac Enzymes No results for input(s): TROPONINI, PROBNP in the last 168 hours.  Glucose  Recent Labs Lab 03/03/17 1017  GLUCAP 157*     STUDIES:  03/03/2017>> CT Head>>: No acute finding after right PCA revascularization. A right superior cerebellar infarct is obscured due toenhancement. No visible occipital infarct. CXR 9/1 > no acute abnormality I have reviewed all images personally  CULTURES: None  ANTIBIOTICS: 03/03/2017>> Vanc 1 gram pre-op  SIGNIFICANT EVENTS: 03/02/2017>> Last seen normal 8/30 11 pm. 03/03/2017>> Right PCA Re-Vascularization LINES/TUBES: 03/03/2017>> ETT 03/03/2017>> L Radial A line 03/03/2017>> R AC large bore PIV 03/03/2017>> L Hand  Large bore PIV  DISCUSSION: Pt. With . Evolving RT SCA ischemic stroke. CTP . Taken for Mechanical retrieval of clot from PCA with TC3 reperfusion. CCM asked to assist with extubation post procedure.  ASSESSMENT / PLAN:  PULMONARY A: Intubation for surgical intervention Current Every day smoker Clear secretions  P:   Stable post extubation Wean down O2  CARDIOVASCULAR A:  Hx of CAD,CHF?,EF 25 %, SP CABG 2013 History of medical non-compliance P:  Cardene for BP goal of 120-140  Add home meds as able  RENAL A:   No acute issues P:   Follow urine output and lites   GASTROINTESTINAL A:   No acute issues P:   Clears  HEMATOLOGIC A:   No acute issues P:  Monitor CBC Assess for bleeding   INFECTIOUS A:   No acute issues P:   Monitor fever curve and WBC Observe off antibiotics  ENDOCRINE A:   Hyperglycemia per CMET   P:   Follow CBC   NEUROLOGIC A:   Hx. TIA, medical non-compliance with coumadin/ xaralto P:   Management per neurology.  FAMILY   - Updates: No family at bedside to update. Apparently he is estranged form wife, but she gave consent for procedure.  PCCM will be available as needed. Please call with any questions  Chilton Greathouse MD [facility] Pager [phone] If no answer or after 3pm call: [phone] 03/04/2017, 8:32 AM

## 2017-03-04 NOTE — Progress Notes (Signed)
Rehab Admissions Coordinator Note:  Patient was screened by Trish Mage for appropriateness for an Inpatient Acute Rehab Consult.  At this time, we are recommending Inpatient Rehab consult.  Trish Mage 03/04/2017, 3:44 PM  I can be reached at [phone].

## 2017-03-04 NOTE — Progress Notes (Signed)
Referring Physician(s):  Dr. Ritta Slot  Supervising Physician: Oley Balm  Patient Status:  [facility] - In-pt  Chief Complaint: CVA, Right PCA occlusion  Subjective: Patient sitting up in chair. Conversant.  Moving all extremities, working with PT. Little memory of events of yesterday.   Allergies: Adhesive [tape]; Bee venom; and Penicillins  Medications: Prior to Admission medications   Medication Sig Start Date End Date Taking? Authorizing Provider  aspirin EC 81 MG tablet Take 1 tablet (81 mg total) by mouth daily. Patient not taking: Reported on 03/04/2017 04/08/13   Ulla Potash B, NP  carvedilol (COREG) 6.25 MG tablet Take 1 tablet (6.25 mg total) by mouth 2 (two) times daily with a meal. Patient not taking: Reported on 03/04/2017 02/12/15   Bensimhon, Bevelyn Buckles, MD  nitroGLYCERIN (NITROLINGUAL) 0.4 MG/SPRAY spray Place 1 spray under the tongue every 5 (five) minutes x 3 doses as needed for chest pain. Patient not taking: Reported on 05/30/2016 06/09/15   Barrett, Joline Salt, PA-C  rivaroxaban (XARELTO) 20 MG TABS tablet Take 1 tablet (20 mg total) by mouth daily with supper. Patient not taking: Reported on 03/04/2017 02/12/15   Bensimhon, Bevelyn Buckles, MD  sacubitril-valsartan (ENTRESTO) 24-26 MG Take 1 tablet by mouth 2 (two) times daily. Patient not taking: Reported on 03/04/2017 06/01/16   Graciella Freer, PA-C     Vital Signs: BP 116/70   Pulse 73   Temp 98.1 F (36.7 C) (Oral)   Resp 19   Ht 5\' 8"  (1.727 m)   Wt 156 lb 12 oz (71.1 kg)   SpO2 100%   BMI 23.83 kg/m   Physical Exam  Constitutional: He is oriented to person, place, and time. He appears well-developed.  Neurological: He is alert and oriented to person, place, and time.  Moving all 4 extremities.  Able to walk halls with PT. Finger-to-nose uncoordinated on the right, stable on left. Hemianopsia improved.   Nursing note and vitals reviewed.   Imaging: Ct Angio Head W Or Wo  Contrast  Result Date: 03/03/2017 CLINICAL DATA:  Left-sided weakness and hemianopia. EXAM: CT ANGIOGRAPHY HEAD AND NECK CT PERFUSION BRAIN TECHNIQUE: Multidetector CT imaging of the head and neck was performed using the standard protocol during bolus administration of intravenous contrast. Multiplanar CT image reconstructions and MIPs were obtained to evaluate the vascular anatomy. Carotid stenosis measurements (when applicable) are obtained utilizing NASCET criteria, using the distal internal carotid diameter as the denominator. Multiphase CT imaging of the brain was performed following IV bolus contrast injection. Subsequent parametric perfusion maps were calculated using RAPID software. CONTRAST:  100 cc Isovue 370 intravenous COMPARISON:  Head CT from earlier today. CTA of the head neck 01/07/2016 FINDINGS: CTA NECK FINDINGS Aortic arch: Partly seen changes of CABG. The visible saphenous grafts and upper LIMA are enhancing. No acute finding. Right carotid system: Minimal atheromatous changes at the ICA bulb. No stenosis or ulceration. Left carotid system: Minimal atheromatous changes at the ICA bulb. No stenosis or ulceration. Negative for dissection. Vertebral arteries: No proximal subclavian stenosis. There is mild atheromatous change at the origin of the left subclavian. Both vertebral arteries are smooth and widely patent to the dura. Skeleton: No acute or aggressive finding. Other neck: No incidental mass or inflammation. Upper chest: Status post CABG. Subcutaneous defibrillator. Paraseptal emphysematous change. Review of the MIP images confirms the above findings CTA HEAD FINDINGS Anterior circulation: Mild atheromatous changes on the carotid siphons, but greater than expected for age. Negative for branch  occlusion or flow limiting stenosis. Negative for aneurysm. Posterior circulation: Mild right vertebral artery dominance. Despite infarct, right superior cerebellar artery is seen and has recannulized.  There is right proximal P2 segment occlusion. Negative for aneurysm. Venous sinuses: Patent Anatomic variants: None significant Delayed phase: Not performed in the emergent setting Review of the MIP images confirms the above findings CT Brain Perfusion Findings: CBF (<30%) Volume: 13mL Perfusion (Tmax>6.0s) volume: 64 ccmL Mismatch Volume: 51 ccmL Infarction Location:Right occipital Case discussed with Dr. Amada Jupiter 03/03/2017  at 7:56 am. IMPRESSION: 1. Right proximal P2 occlusion. By cerebral perfusion there is 13 cc of right occipital infarct and 64 cc of ischemia - 51 cc penumbra. 2. Mild atheromatous changes. No flow limiting stenosis or embolic source identified more proximally. 3. Right superior cerebellar infarct. The right superior cerebellar artery has recannulized. Electronically Signed   By: Marnee Spring M.D.   On: 03/03/2017 08:00   Ct Head Wo Contrast  Result Date: 03/03/2017 CLINICAL DATA:  Post stroke intervention EXAM: CT HEAD WITHOUT CONTRAST TECHNIQUE: Contiguous axial images were obtained from the base of the skull through the vertex without intravenous contrast. COMPARISON:  03/03/2017 FINDINGS: Brain: No hemorrhage. No visible occipital infarct. The right superior cerebellar infarct is obscured postcontrast. Vascular: No asymmetry vessel density. Skull: Negative. Sinuses/Orbits: Intubation with nasopharyngeal fluid. No acute finding IMPRESSION: 1. No acute finding after right PCA revascularization. 2. A right superior cerebellar infarct is obscured due to enhancement. No visible occipital infarct. Electronically Signed   By: Marnee Spring M.D.   On: 03/03/2017 10:18   Ct Angio Neck W Or Wo Contrast  Result Date: 03/03/2017 CLINICAL DATA:  Left-sided weakness and hemianopia. EXAM: CT ANGIOGRAPHY HEAD AND NECK CT PERFUSION BRAIN TECHNIQUE: Multidetector CT imaging of the head and neck was performed using the standard protocol during bolus administration of intravenous contrast.  Multiplanar CT image reconstructions and MIPs were obtained to evaluate the vascular anatomy. Carotid stenosis measurements (when applicable) are obtained utilizing NASCET criteria, using the distal internal carotid diameter as the denominator. Multiphase CT imaging of the brain was performed following IV bolus contrast injection. Subsequent parametric perfusion maps were calculated using RAPID software. CONTRAST:  100 cc Isovue 370 intravenous COMPARISON:  Head CT from earlier today. CTA of the head neck 01/07/2016 FINDINGS: CTA NECK FINDINGS Aortic arch: Partly seen changes of CABG. The visible saphenous grafts and upper LIMA are enhancing. No acute finding. Right carotid system: Minimal atheromatous changes at the ICA bulb. No stenosis or ulceration. Left carotid system: Minimal atheromatous changes at the ICA bulb. No stenosis or ulceration. Negative for dissection. Vertebral arteries: No proximal subclavian stenosis. There is mild atheromatous change at the origin of the left subclavian. Both vertebral arteries are smooth and widely patent to the dura. Skeleton: No acute or aggressive finding. Other neck: No incidental mass or inflammation. Upper chest: Status post CABG. Subcutaneous defibrillator. Paraseptal emphysematous change. Review of the MIP images confirms the above findings CTA HEAD FINDINGS Anterior circulation: Mild atheromatous changes on the carotid siphons, but greater than expected for age. Negative for branch occlusion or flow limiting stenosis. Negative for aneurysm. Posterior circulation: Mild right vertebral artery dominance. Despite infarct, right superior cerebellar artery is seen and has recannulized. There is right proximal P2 segment occlusion. Negative for aneurysm. Venous sinuses: Patent Anatomic variants: None significant Delayed phase: Not performed in the emergent setting Review of the MIP images confirms the above findings CT Brain Perfusion Findings: CBF (<30%) Volume: 13mL  Perfusion (Tmax>6.0s)  volume: 64 ccmL Mismatch Volume: 51 ccmL Infarction Location:Right occipital Case discussed with Dr. Amada Jupiter 03/03/2017  at 7:56 am. IMPRESSION: 1. Right proximal P2 occlusion. By cerebral perfusion there is 13 cc of right occipital infarct and 64 cc of ischemia - 51 cc penumbra. 2. Mild atheromatous changes. No flow limiting stenosis or embolic source identified more proximally. 3. Right superior cerebellar infarct. The right superior cerebellar artery has recannulized. Electronically Signed   By: Marnee Spring M.D.   On: 03/03/2017 08:00   Ct Cerebral Perfusion W Contrast  Result Date: 03/03/2017 CLINICAL DATA:  Left-sided weakness and hemianopia. EXAM: CT ANGIOGRAPHY HEAD AND NECK CT PERFUSION BRAIN TECHNIQUE: Multidetector CT imaging of the head and neck was performed using the standard protocol during bolus administration of intravenous contrast. Multiplanar CT image reconstructions and MIPs were obtained to evaluate the vascular anatomy. Carotid stenosis measurements (when applicable) are obtained utilizing NASCET criteria, using the distal internal carotid diameter as the denominator. Multiphase CT imaging of the brain was performed following IV bolus contrast injection. Subsequent parametric perfusion maps were calculated using RAPID software. CONTRAST:  100 cc Isovue 370 intravenous COMPARISON:  Head CT from earlier today. CTA of the head neck 01/07/2016 FINDINGS: CTA NECK FINDINGS Aortic arch: Partly seen changes of CABG. The visible saphenous grafts and upper LIMA are enhancing. No acute finding. Right carotid system: Minimal atheromatous changes at the ICA bulb. No stenosis or ulceration. Left carotid system: Minimal atheromatous changes at the ICA bulb. No stenosis or ulceration. Negative for dissection. Vertebral arteries: No proximal subclavian stenosis. There is mild atheromatous change at the origin of the left subclavian. Both vertebral arteries are smooth and widely  patent to the dura. Skeleton: No acute or aggressive finding. Other neck: No incidental mass or inflammation. Upper chest: Status post CABG. Subcutaneous defibrillator. Paraseptal emphysematous change. Review of the MIP images confirms the above findings CTA HEAD FINDINGS Anterior circulation: Mild atheromatous changes on the carotid siphons, but greater than expected for age. Negative for branch occlusion or flow limiting stenosis. Negative for aneurysm. Posterior circulation: Mild right vertebral artery dominance. Despite infarct, right superior cerebellar artery is seen and has recannulized. There is right proximal P2 segment occlusion. Negative for aneurysm. Venous sinuses: Patent Anatomic variants: None significant Delayed phase: Not performed in the emergent setting Review of the MIP images confirms the above findings CT Brain Perfusion Findings: CBF (<30%) Volume: 31mL Perfusion (Tmax>6.0s) volume: 64 ccmL Mismatch Volume: 51 ccmL Infarction Location:Right occipital Case discussed with Dr. Amada Jupiter 03/03/2017  at 7:56 am. IMPRESSION: 1. Right proximal P2 occlusion. By cerebral perfusion there is 13 cc of right occipital infarct and 64 cc of ischemia - 51 cc penumbra. 2. Mild atheromatous changes. No flow limiting stenosis or embolic source identified more proximally. 3. Right superior cerebellar infarct. The right superior cerebellar artery has recannulized. Electronically Signed   By: Marnee Spring M.D.   On: 03/03/2017 08:00   Dg Chest Port 1 View  Result Date: 03/04/2017 CLINICAL DATA:  Respiratory failure. EXAM: PORTABLE CHEST 1 VIEW COMPARISON:  Chest x-ray 03/03/2017 and 02/05/2017. FINDINGS: Cardiac enlargement is again noted. There is no edema or effusion. Median sternotomy for CABG is noted. Superficial AICD is stable. IMPRESSION: 1. Stable cardiomegaly without failure. 2. No acute cardiopulmonary disease. Electronically Signed   By: Marin Roberts M.D.   On: 03/04/2017 09:36   Dg  Chest Port 1 View  Result Date: 03/03/2017 CLINICAL DATA:  41 year old male with history of code stroke. EXAM: PORTABLE  CHEST 1 VIEW COMPARISON:  Chest x-ray 02/05/2017. FINDINGS: Lung volumes are low. No acute consolidative airspace disease. No pleural effusions. No evidence of pulmonary edema. No pneumothorax. Mild cardiomegaly, worsened compared to the prior study. Upper mediastinal contours are within normal limits. Aortic atherosclerosis. Status post median sternotomy for CABG. Left-sided defibrillator in place with lead projecting over the mediastinum. IMPRESSION: 1. No radiographic evidence of acute cardiopulmonary disease. 2. Worsening cardiomegaly. 3. Aortic atherosclerosis. Electronically Signed   By: Trudie Reed M.D.   On: 03/03/2017 12:29   Ct Head Code Stroke Wo Contrast  Result Date: 03/03/2017 CLINICAL DATA:  Code stroke. Left-sided weakness with neglect. Hemianopia. EXAM: CT HEAD WITHOUT CONTRAST TECHNIQUE: Contiguous axial images were obtained from the base of the skull through the vertex without intravenous contrast. COMPARISON:  01/07/2016 FINDINGS: Brain: There is edema in the right superior cerebellar artery distribution. No cerebral infarct is seen. No acute hemorrhage, hydrocephalus, or masslike findings. Vascular: Hyperdense right P2 segment. Skull: No acute or aggressive finding Sinuses/Orbits: Negative Other: These results were called by telephone at the time of interpretation on 03/03/2017 at 7:27 am to Dr. Amada Jupiter , who verbally acknowledged these results. ASPECTS [facility] Stroke Program Early CT Score) Not scored in this setting. IMPRESSION: 1. Acute right superior cerebellar infarct. 2. Hyperdense right P2 without definitively seen occipital infarct. Electronically Signed   By: Marnee Spring M.D.   On: 03/03/2017 07:33    Labs:  CBC:  Recent Labs  02/05/17 2154 03/03/17 0710 03/03/17 0718 03/04/17 0429  WBC 11.8* 11.0*  --  10.0  HGB 14.5 14.5 15.0 13.1   HCT 41.4 41.3 44.0 38.6*  PLT 237 260  --  254    COAGS:  Recent Labs  03/03/17 0710 03/04/17 1235  INR 0.96 1.01  APTT 26  --     BMP:  Recent Labs  05/30/16 1146 02/05/17 2154 03/03/17 0710 03/03/17 0718 03/04/17 0429  NA 136 137 136 138 138  K 4.2 3.8 4.3 4.3 3.7  CL 104 107 103 101 109  CO2 25 18* 25  --  23  GLUCOSE 88 94 155* 156* 87  BUN 9 7 13 15 7   CALCIUM 9.5 9.3 9.1  --  8.6*  CREATININE 1.05 0.97 0.93 0.80 0.65  GFRNONAA >60 >60 >60  --  >60  GFRAA >60 >60 >60  --  >60    LIVER FUNCTION TESTS:  Recent Labs  03/03/17 0710  BILITOT 0.6  AST 28  ALT 23  ALKPHOS 76  PROT 6.7  ALBUMIN 3.6    Assessment and Plan: Right PCA P1 segment occlusion s/p complete revascularization and reestablishes of TICI 3 reperfusion Patient significantly improved on neurologic exam today. Continue plans per Neuro and rehab.  On Xarelto and aspirin. Counseled on cessation of smoking.  All questions answered.  IR to follow.   Electronically Signed: Hoyt Koch, PA 03/04/2017, 3:58 PM   I spent a total of 15 Minutes at the the patient's bedside AND on the patient's hospital floor or unit, greater than 50% of which was counseling/coordinating care for CVA

## 2017-03-04 NOTE — Evaluation (Signed)
Physical Therapy Evaluation Patient Details Name: Logan [name] MRN: [id number] DOB: 05-05-1976 Today's Date: 03/04/2017   History of Present Illness  Patient is a 41 yr old male admitted 03/03/17 with AMS and Lt sided weakness, left facial droop, slurred speech and Left sided gaze.  Patient with evolving RT SCA ischemic stroke.  Patient s/p revascularization in IR.    PMH:    Anginal pain; CAD; Chronic systolic CHF; Hyperlipidemia; Ischemic cardiomyopathy; LV mural thrombus; Moderate mitral regurgitation; Myocardial infarction; Noncompliance; Stroke; Syncope; and Tobacco abuse; CABG     Clinical Impression  Patient presents with problems listed below.  Will benefit from acute PT to maximize functional mobility prior to discharge.  Patient was independent pta.  Today patient with decreased coordination, ataxic gait, and decreased balance impacting mobility and safety.  Recommend Inpatient Rehab consult to return patient to optimal functional level prior to return home with family.    Follow Up Recommendations CIR    Equipment Recommendations  Rolling walker with 5" wheels    Recommendations for Other Services Rehab consult     Precautions / Restrictions Precautions Precautions: Fall Restrictions Weight Bearing Restrictions: No      Mobility  Bed Mobility Overal bed mobility: Needs Assistance Bed Mobility: Supine to Sit     Supine to sit: Min guard     General bed mobility comments: Assist for safety.  Transfers Overall transfer level: Needs assistance Equipment used: 2 person hand held assist Transfers: Sit to/from Stand Sit to Stand: Min assist;+2 physical assistance;+2 safety/equipment         General transfer comment: Assist to power up to stance and maintain balance.  Patient requiring UE support to maintain static balance  Ambulation/Gait Ambulation/Gait assistance: Mod assist;+2 safety/equipment Ambulation Distance (Feet): 12 Feet Assistive device: 1 person  hand held assist Gait Pattern/deviations: Step-through pattern;Decreased stride length;Decreased step length - right;Decreased step length - left;Ataxic;Staggering left;Staggering right;Trunk flexed;Narrow base of support Gait velocity: decreased Gait velocity interpretation: Below normal speed for age/gender General Gait Details: Patient with very ataxic gait, and decreased balance. Patient required UE support and external mod assist to prevent fall during gait.  Patient with flexed posture and head down during gait.  Patient looking at feet to help with foot placement.  Encouraged patient to look up during gait for safety - assist to prevent patient from running into objects.  Stairs            Wheelchair Mobility    Modified Rankin (Stroke Patients Only) Modified Rankin (Stroke Patients Only) Pre-Morbid Rankin Score: No symptoms Modified Rankin: Moderately severe disability     Balance Overall balance assessment: Needs assistance         Standing balance support: Bilateral upper extremity supported Standing balance-Leahy Scale: Poor Standing balance comment: Requires UE support and external assist to prevent fall.                             Pertinent Vitals/Pain Pain Assessment: 0-10 Pain Score: 7  Pain Location: Head Pain Descriptors / Indicators: Headache Pain Intervention(s): Monitored during session    Home Living                        Prior Function           Comments: does not work or drive     Higher education careers adviser        Extremity/Trunk Assessment   Upper Extremity Assessment  Upper Extremity Assessment: Defer to OT evaluation    Lower Extremity Assessment Lower Extremity Assessment: LLE deficits/detail LLE Deficits / Details: Patient with strength at least 4+/5.  Decreased coordination. LLE Coordination: decreased gross motor    Cervical / Trunk Assessment Cervical / Trunk Assessment: Normal  Communication       Cognition Arousal/Alertness: Awake/alert Behavior During Therapy: WFL for tasks assessed/performed Overall Cognitive Status: Within Functional Limits for tasks assessed                                        General Comments      Exercises     Assessment/Plan    PT Assessment Patient needs continued PT services  PT Problem List Decreased activity tolerance;Decreased balance;Decreased mobility;Decreased coordination;Decreased knowledge of use of DME;Cardiopulmonary status limiting activity;Pain       PT Treatment Interventions DME instruction;Gait training;Functional mobility training;Therapeutic activities;Therapeutic exercise;Balance training;Neuromuscular re-education;Patient/family education    PT Goals (Current goals can be found in the Care Plan section)  Acute Rehab PT Goals Patient Stated Goal: To walk PT Goal Formulation: With patient Time For Goal Achievement: 03/11/17 Potential to Achieve Goals: Good    Frequency Min 4X/week   Barriers to discharge        Co-evaluation PT/OT/SLP Co-Evaluation/Treatment: Yes Reason for Co-Treatment: For patient/therapist safety;To address functional/ADL transfers PT goals addressed during session: Mobility/safety with mobility         AM-PAC PT "6 Clicks" Daily Activity  Outcome Measure Difficulty turning over in bed (including adjusting bedclothes, sheets and blankets)?: None Difficulty moving from lying on back to sitting on the side of the bed? : None Difficulty sitting down on and standing up from a chair with arms (e.g., wheelchair, bedside commode, etc,.)?: Unable Help needed moving to and from a bed to chair (including a wheelchair)?: A Little Help needed walking in hospital room?: A Lot Help needed climbing 3-5 steps with a railing? : A Lot 6 Click Score: 16    End of Session Equipment Utilized During Treatment: Gait belt Activity Tolerance: Patient tolerated treatment well;Patient limited by  fatigue Patient left: in chair;with call bell/phone within reach;with chair alarm set Nurse Communication: Mobility status PT Visit Diagnosis: Unsteadiness on feet (R26.81);Other abnormalities of gait and mobility (R26.89);Ataxic gait (R26.0);Pain Pain - part of body:  (Headache)    Time: 4650-3546 PT Time Calculation (min) (ACUTE ONLY): 25 min   Charges:   PT Evaluation $PT Eval Moderate Complexity: 1 Mod     PT G Codes:        Logan Hurt. Logan [name], Logan [name] Acute Rehab Services Pager [phone]'  Logan [name] 03/04/2017, 1:37 PM

## 2017-03-05 DIAGNOSIS — I639 Cerebral infarction, unspecified: Secondary | ICD-10-CM

## 2017-03-05 DIAGNOSIS — Z720 Tobacco use: Secondary | ICD-10-CM

## 2017-03-05 DIAGNOSIS — F1210 Cannabis abuse, uncomplicated: Secondary | ICD-10-CM

## 2017-03-05 DIAGNOSIS — I63431 Cerebral infarction due to embolism of right posterior cerebral artery: Secondary | ICD-10-CM

## 2017-03-05 DIAGNOSIS — I6340 Cerebral infarction due to embolism of unspecified cerebral artery: Secondary | ICD-10-CM

## 2017-03-05 DIAGNOSIS — G459 Transient cerebral ischemic attack, unspecified: Secondary | ICD-10-CM

## 2017-03-05 DIAGNOSIS — I2510 Atherosclerotic heart disease of native coronary artery without angina pectoris: Secondary | ICD-10-CM

## 2017-03-05 DIAGNOSIS — K219 Gastro-esophageal reflux disease without esophagitis: Secondary | ICD-10-CM

## 2017-03-05 DIAGNOSIS — I059 Rheumatic mitral valve disease, unspecified: Secondary | ICD-10-CM

## 2017-03-05 DIAGNOSIS — G8191 Hemiplegia, unspecified affecting right dominant side: Secondary | ICD-10-CM

## 2017-03-05 DIAGNOSIS — I5043 Acute on chronic combined systolic (congestive) and diastolic (congestive) heart failure: Secondary | ICD-10-CM

## 2017-03-05 DIAGNOSIS — F17200 Nicotine dependence, unspecified, uncomplicated: Secondary | ICD-10-CM

## 2017-03-05 DIAGNOSIS — I340 Nonrheumatic mitral (valve) insufficiency: Secondary | ICD-10-CM

## 2017-03-05 NOTE — Plan of Care (Signed)
Problem: Activity: Goal: Risk for activity intolerance will decrease Outcome: Progressing Patient able to ambulate with front wheel Logan [name] for balance and RN assist to manage tele cords. R side gait disturbance and patient reports issues with balance more than strength while ambulating. Patient tolerated movement from bed to bathroom to chair with no other difficulties than listed above.

## 2017-03-05 NOTE — Plan of Care (Addendum)
Problem: Education: Goal: Knowledge of disease or condition will improve Outcome: Progressing Patient able to verbalize importance of compliance with medications to help prevent future strokes. Specifically spoke about carvedilol, xarelto, and cholesterol medications. Goal: Knowledge of secondary prevention will improve Outcome: Progressing Discussed with patient importance of keeping all follow up appointments. Patient verbalized understanding. Goal: Knowledge of patient specific risk factors addressed and post discharge goals established will improve Outcome: Progressing Patient counseled to cease alcohol intake and smoking of all types in order to lower risk for recurrent stroke. Patient expressed desire and willingness to do both.  Xarelto warnings such as bleeding risk discussed with patient. Patient verbalize understanding.  *carvedilol is outpatient medication that patient had questions about.

## 2017-03-05 NOTE — Plan of Care (Signed)
Problem: Health Behavior/Discharge Planning: Goal: Ability to manage health-related needs will improve Outcome: Progressing Patient evaluated and treat by therapy while inpatient. Making progress towards goals. Therapy recommends CIR.  Patient making adequate amounts of urine, no signs of retention.
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
## 2017-03-05 NOTE — Progress Notes (Signed)
Referring Physician(s):  Dr. Ritta Slot  Supervising Physician: Oley Balm  Patient Status:  [facility] - In-pt  Chief Complaint: CVA, Right PCA occlusion  Subjective: Continues to progress with therapy and recovery.  No complaints today.   Allergies: Adhesive [tape]; Bee venom; and Penicillins  Medications: Prior to Admission medications   Medication Sig Start Date End Date Taking? Authorizing Provider  aspirin EC 81 MG tablet Take 1 tablet (81 mg total) by mouth daily. Patient not taking: Reported on 03/04/2017 04/08/13   Ulla Potash B, NP  carvedilol (COREG) 6.25 MG tablet Take 1 tablet (6.25 mg total) by mouth 2 (two) times daily with a meal. Patient not taking: Reported on 03/04/2017 02/12/15   Bensimhon, Bevelyn Buckles, MD  nitroGLYCERIN (NITROLINGUAL) 0.4 MG/SPRAY spray Place 1 spray under the tongue every 5 (five) minutes x 3 doses as needed for chest pain. Patient not taking: Reported on 05/30/2016 06/09/15   Barrett, Joline Salt, PA-C  rivaroxaban (XARELTO) 20 MG TABS tablet Take 1 tablet (20 mg total) by mouth daily with supper. Patient not taking: Reported on 03/04/2017 02/12/15   Bensimhon, Bevelyn Buckles, MD  sacubitril-valsartan (ENTRESTO) 24-26 MG Take 1 tablet by mouth 2 (two) times daily. Patient not taking: Reported on 03/04/2017 06/01/16   Graciella Freer, PA-C     Vital Signs: BP 125/71 (BP Location: Right Arm)   Pulse (!) 35   Temp 98.6 F (37 C) (Oral)   Resp 17   Ht 5\' 8"  (1.727 m)   Wt 151 lb 3.8 oz (68.6 kg)   SpO2 100%   BMI 23.00 kg/m   Physical Exam  Constitutional: He is oriented to person, place, and time. He appears well-developed.  Neurological: He is alert and oriented to person, place, and time.  Moving all 4 extremities. Finger-to-nose uncoordinated on the right but slightly improved, stable on left. Hemianopsia improving.   Nursing note and vitals reviewed.   Imaging: Ct Angio Head W Or Wo Contrast  Result Date: 03/03/2017 CLINICAL  DATA:  Left-sided weakness and hemianopia. EXAM: CT ANGIOGRAPHY HEAD AND NECK CT PERFUSION BRAIN TECHNIQUE: Multidetector CT imaging of the head and neck was performed using the standard protocol during bolus administration of intravenous contrast. Multiplanar CT image reconstructions and MIPs were obtained to evaluate the vascular anatomy. Carotid stenosis measurements (when applicable) are obtained utilizing NASCET criteria, using the distal internal carotid diameter as the denominator. Multiphase CT imaging of the brain was performed following IV bolus contrast injection. Subsequent parametric perfusion maps were calculated using RAPID software. CONTRAST:  100 cc Isovue 370 intravenous COMPARISON:  Head CT from earlier today. CTA of the head neck 01/07/2016 FINDINGS: CTA NECK FINDINGS Aortic arch: Partly seen changes of CABG. The visible saphenous grafts and upper LIMA are enhancing. No acute finding. Right carotid system: Minimal atheromatous changes at the ICA bulb. No stenosis or ulceration. Left carotid system: Minimal atheromatous changes at the ICA bulb. No stenosis or ulceration. Negative for dissection. Vertebral arteries: No proximal subclavian stenosis. There is mild atheromatous change at the origin of the left subclavian. Both vertebral arteries are smooth and widely patent to the dura. Skeleton: No acute or aggressive finding. Other neck: No incidental mass or inflammation. Upper chest: Status post CABG. Subcutaneous defibrillator. Paraseptal emphysematous change. Review of the MIP images confirms the above findings CTA HEAD FINDINGS Anterior circulation: Mild atheromatous changes on the carotid siphons, but greater than expected for age. Negative for branch occlusion or flow limiting stenosis. Negative for  aneurysm. Posterior circulation: Mild right vertebral artery dominance. Despite infarct, right superior cerebellar artery is seen and has recannulized. There is right proximal P2 segment  occlusion. Negative for aneurysm. Venous sinuses: Patent Anatomic variants: None significant Delayed phase: Not performed in the emergent setting Review of the MIP images confirms the above findings CT Brain Perfusion Findings: CBF (<30%) Volume: 13mL Perfusion (Tmax>6.0s) volume: 64 ccmL Mismatch Volume: 51 ccmL Infarction Location:Right occipital Case discussed with Dr. Amada Jupiter 03/03/2017  at 7:56 am. IMPRESSION: 1. Right proximal P2 occlusion. By cerebral perfusion there is 13 cc of right occipital infarct and 64 cc of ischemia - 51 cc penumbra. 2. Mild atheromatous changes. No flow limiting stenosis or embolic source identified more proximally. 3. Right superior cerebellar infarct. The right superior cerebellar artery has recannulized. Electronically Signed   By: Marnee Spring M.D.   On: 03/03/2017 08:00   Ct Head Wo Contrast  Result Date: 03/03/2017 CLINICAL DATA:  Post stroke intervention EXAM: CT HEAD WITHOUT CONTRAST TECHNIQUE: Contiguous axial images were obtained from the base of the skull through the vertex without intravenous contrast. COMPARISON:  03/03/2017 FINDINGS: Brain: No hemorrhage. No visible occipital infarct. The right superior cerebellar infarct is obscured postcontrast. Vascular: No asymmetry vessel density. Skull: Negative. Sinuses/Orbits: Intubation with nasopharyngeal fluid. No acute finding IMPRESSION: 1. No acute finding after right PCA revascularization. 2. A right superior cerebellar infarct is obscured due to enhancement. No visible occipital infarct. Electronically Signed   By: Marnee Spring M.D.   On: 03/03/2017 10:18   Ct Angio Neck W Or Wo Contrast  Result Date: 03/03/2017 CLINICAL DATA:  Left-sided weakness and hemianopia. EXAM: CT ANGIOGRAPHY HEAD AND NECK CT PERFUSION BRAIN TECHNIQUE: Multidetector CT imaging of the head and neck was performed using the standard protocol during bolus administration of intravenous contrast. Multiplanar CT image reconstructions  and MIPs were obtained to evaluate the vascular anatomy. Carotid stenosis measurements (when applicable) are obtained utilizing NASCET criteria, using the distal internal carotid diameter as the denominator. Multiphase CT imaging of the brain was performed following IV bolus contrast injection. Subsequent parametric perfusion maps were calculated using RAPID software. CONTRAST:  100 cc Isovue 370 intravenous COMPARISON:  Head CT from earlier today. CTA of the head neck 01/07/2016 FINDINGS: CTA NECK FINDINGS Aortic arch: Partly seen changes of CABG. The visible saphenous grafts and upper LIMA are enhancing. No acute finding. Right carotid system: Minimal atheromatous changes at the ICA bulb. No stenosis or ulceration. Left carotid system: Minimal atheromatous changes at the ICA bulb. No stenosis or ulceration. Negative for dissection. Vertebral arteries: No proximal subclavian stenosis. There is mild atheromatous change at the origin of the left subclavian. Both vertebral arteries are smooth and widely patent to the dura. Skeleton: No acute or aggressive finding. Other neck: No incidental mass or inflammation. Upper chest: Status post CABG. Subcutaneous defibrillator. Paraseptal emphysematous change. Review of the MIP images confirms the above findings CTA HEAD FINDINGS Anterior circulation: Mild atheromatous changes on the carotid siphons, but greater than expected for age. Negative for branch occlusion or flow limiting stenosis. Negative for aneurysm. Posterior circulation: Mild right vertebral artery dominance. Despite infarct, right superior cerebellar artery is seen and has recannulized. There is right proximal P2 segment occlusion. Negative for aneurysm. Venous sinuses: Patent Anatomic variants: None significant Delayed phase: Not performed in the emergent setting Review of the MIP images confirms the above findings CT Brain Perfusion Findings: CBF (<30%) Volume: 13mL Perfusion (Tmax>6.0s) volume: 64 ccmL  Mismatch Volume: 51 ccmL  Infarction Location:Right occipital Case discussed with Dr. Amada Jupiter 03/03/2017  at 7:56 am. IMPRESSION: 1. Right proximal P2 occlusion. By cerebral perfusion there is 13 cc of right occipital infarct and 64 cc of ischemia - 51 cc penumbra. 2. Mild atheromatous changes. No flow limiting stenosis or embolic source identified more proximally. 3. Right superior cerebellar infarct. The right superior cerebellar artery has recannulized. Electronically Signed   By: Marnee Spring M.D.   On: 03/03/2017 08:00   Ct Cerebral Perfusion W Contrast  Result Date: 03/03/2017 CLINICAL DATA:  Left-sided weakness and hemianopia. EXAM: CT ANGIOGRAPHY HEAD AND NECK CT PERFUSION BRAIN TECHNIQUE: Multidetector CT imaging of the head and neck was performed using the standard protocol during bolus administration of intravenous contrast. Multiplanar CT image reconstructions and MIPs were obtained to evaluate the vascular anatomy. Carotid stenosis measurements (when applicable) are obtained utilizing NASCET criteria, using the distal internal carotid diameter as the denominator. Multiphase CT imaging of the brain was performed following IV bolus contrast injection. Subsequent parametric perfusion maps were calculated using RAPID software. CONTRAST:  100 cc Isovue 370 intravenous COMPARISON:  Head CT from earlier today. CTA of the head neck 01/07/2016 FINDINGS: CTA NECK FINDINGS Aortic arch: Partly seen changes of CABG. The visible saphenous grafts and upper LIMA are enhancing. No acute finding. Right carotid system: Minimal atheromatous changes at the ICA bulb. No stenosis or ulceration. Left carotid system: Minimal atheromatous changes at the ICA bulb. No stenosis or ulceration. Negative for dissection. Vertebral arteries: No proximal subclavian stenosis. There is mild atheromatous change at the origin of the left subclavian. Both vertebral arteries are smooth and widely patent to the dura. Skeleton: No acute  or aggressive finding. Other neck: No incidental mass or inflammation. Upper chest: Status post CABG. Subcutaneous defibrillator. Paraseptal emphysematous change. Review of the MIP images confirms the above findings CTA HEAD FINDINGS Anterior circulation: Mild atheromatous changes on the carotid siphons, but greater than expected for age. Negative for branch occlusion or flow limiting stenosis. Negative for aneurysm. Posterior circulation: Mild right vertebral artery dominance. Despite infarct, right superior cerebellar artery is seen and has recannulized. There is right proximal P2 segment occlusion. Negative for aneurysm. Venous sinuses: Patent Anatomic variants: None significant Delayed phase: Not performed in the emergent setting Review of the MIP images confirms the above findings CT Brain Perfusion Findings: CBF (<30%) Volume: 13mL Perfusion (Tmax>6.0s) volume: 64 ccmL Mismatch Volume: 51 ccmL Infarction Location:Right occipital Case discussed with Dr. Amada Jupiter 03/03/2017  at 7:56 am. IMPRESSION: 1. Right proximal P2 occlusion. By cerebral perfusion there is 13 cc of right occipital infarct and 64 cc of ischemia - 51 cc penumbra. 2. Mild atheromatous changes. No flow limiting stenosis or embolic source identified more proximally. 3. Right superior cerebellar infarct. The right superior cerebellar artery has recannulized. Electronically Signed   By: Marnee Spring M.D.   On: 03/03/2017 08:00   Dg Chest Port 1 View  Result Date: 03/04/2017 CLINICAL DATA:  Respiratory failure. EXAM: PORTABLE CHEST 1 VIEW COMPARISON:  Chest x-ray 03/03/2017 and 02/05/2017. FINDINGS: Cardiac enlargement is again noted. There is no edema or effusion. Median sternotomy for CABG is noted. Superficial AICD is stable. IMPRESSION: 1. Stable cardiomegaly without failure. 2. No acute cardiopulmonary disease. Electronically Signed   By: Marin Roberts M.D.   On: 03/04/2017 09:36   Dg Chest Port 1 View  Result Date:  03/03/2017 CLINICAL DATA:  41 year old male with history of code stroke. EXAM: PORTABLE CHEST 1 VIEW COMPARISON:  Chest x-ray  02/05/2017. FINDINGS: Lung volumes are low. No acute consolidative airspace disease. No pleural effusions. No evidence of pulmonary edema. No pneumothorax. Mild cardiomegaly, worsened compared to the prior study. Upper mediastinal contours are within normal limits. Aortic atherosclerosis. Status post median sternotomy for CABG. Left-sided defibrillator in place with lead projecting over the mediastinum. IMPRESSION: 1. No radiographic evidence of acute cardiopulmonary disease. 2. Worsening cardiomegaly. 3. Aortic atherosclerosis. Electronically Signed   By: Trudie Reed M.D.   On: 03/03/2017 12:29   Ct Head Code Stroke Wo Contrast  Result Date: 03/03/2017 CLINICAL DATA:  Code stroke. Left-sided weakness with neglect. Hemianopia. EXAM: CT HEAD WITHOUT CONTRAST TECHNIQUE: Contiguous axial images were obtained from the base of the skull through the vertex without intravenous contrast. COMPARISON:  01/07/2016 FINDINGS: Brain: There is edema in the right superior cerebellar artery distribution. No cerebral infarct is seen. No acute hemorrhage, hydrocephalus, or masslike findings. Vascular: Hyperdense right P2 segment. Skull: No acute or aggressive finding Sinuses/Orbits: Negative Other: These results were called by telephone at the time of interpretation on 03/03/2017 at 7:27 am to Dr. Amada Jupiter , who verbally acknowledged these results. ASPECTS [facility] Stroke Program Early CT Score) Not scored in this setting. IMPRESSION: 1. Acute right superior cerebellar infarct. 2. Hyperdense right P2 without definitively seen occipital infarct. Electronically Signed   By: Marnee Spring M.D.   On: 03/03/2017 07:33    Labs:  CBC:  Recent Labs  02/05/17 2154 03/03/17 0710 03/03/17 0718 03/04/17 0429  WBC 11.8* 11.0*  --  10.0  HGB 14.5 14.5 15.0 13.1  HCT 41.4 41.3 44.0 38.6*  PLT 237  260  --  254    COAGS:  Recent Labs  03/03/17 0710 03/04/17 1235  INR 0.96 1.01  APTT 26  --     BMP:  Recent Labs  05/30/16 1146 02/05/17 2154 03/03/17 0710 03/03/17 0718 03/04/17 0429  NA 136 137 136 138 138  K 4.2 3.8 4.3 4.3 3.7  CL 104 107 103 101 109  CO2 25 18* 25  --  23  GLUCOSE 88 94 155* 156* 87  BUN 9 7 13 15 7   CALCIUM 9.5 9.3 9.1  --  8.6*  CREATININE 1.05 0.97 0.93 0.80 0.65  GFRNONAA >60 >60 >60  --  >60  GFRAA >60 >60 >60  --  >60    LIVER FUNCTION TESTS:  Recent Labs  03/03/17 0710  BILITOT 0.6  AST 28  ALT 23  ALKPHOS 76  PROT 6.7  ALBUMIN 3.6    Assessment and Plan: Right PCA P1 segment occlusion s/p complete revascularization and reestablishes of TICI 3 reperfusion Patient continues to improve Continue plans per Neuro and rehab. Patient being evaluated by CIR, but appears to be progressing well.  On Xarelto and aspirin. Counseled on cessation of smoking.  All questions answered.  IR to follow.   Electronically Signed: Hoyt Koch, PA 03/05/2017, 3:05 PM   I spent a total of 15 Minutes at the the patient's bedside AND on the patient's hospital floor or unit, greater than 50% of which was counseling/coordinating care for CVA

## 2017-03-05 NOTE — Progress Notes (Signed)
Physical Therapy Treatment Patient Details Name: Logan [name] MRN: [id number] DOB: 1976-07-02 Today's Date: 03/05/2017    History of Present Illness Patient is a 41 yr old male admitted 03/03/17 with AMS and Lt sided weakness, left facial droop, slurred speech and Left sided gaze.  Patient with evolving RT SCA ischemic stroke.  Patient s/p revascularization in IR.    PMH:    Anginal pain; CAD; Chronic systolic CHF; Hyperlipidemia; Ischemic cardiomyopathy; LV mural thrombus; Moderate mitral regurgitation; Myocardial infarction; Noncompliance; Stroke; Syncope; and Tobacco abuse; CABG    PT Comments    Pt making steady progress but remains ataxic with mobility. Continue to feel he can benefit from CIR to maximize his potential after this CVA.    Follow Up Recommendations  CIR     Equipment Recommendations  Rolling walker with 5" wheels    Recommendations for Other Services       Precautions / Restrictions Precautions Precautions: Fall Restrictions Weight Bearing Restrictions: No    Mobility  Bed Mobility Overal bed mobility: Modified Independent Bed Mobility: Supine to Sit;Sit to Supine     Supine to sit: Modified independent (Device/Increase time);HOB elevated Sit to supine: Modified independent (Device/Increase time)   General bed mobility comments: Incr time  Transfers Overall transfer level: Needs assistance Equipment used: 1 person hand held assist;Rolling walker (2 wheeled) Transfers: Sit to/from Stand Sit to Stand: Min guard;Min assist         General transfer comment: Min guard for safety when coming to stand with walker and min assist for balance when coming to stand without walker  Ambulation/Gait Ambulation/Gait assistance: Min assist;Mod assist Ambulation Distance (Feet): 150 Feet (150 x1, 100' x 1) Assistive device: Rolling walker (2 wheeled);1 person hand held assist Gait Pattern/deviations: Step-through pattern;Decreased stride length;Ataxic;Drifts  right/left Gait velocity: decr Gait velocity interpretation: Below normal speed for age/gender General Gait Details: With walker pt requires min assist for balance. Manual facilitation for approximation at hips to improve ataxia. Pt using vision to see placement of feet. Without walker pt requires mod assist using hand held for balance.   Stairs            Wheelchair Mobility    Modified Rankin (Stroke Patients Only) Modified Rankin (Stroke Patients Only) Pre-Morbid Rankin Score: No symptoms Modified Rankin: Moderately severe disability     Balance Overall balance assessment: Needs assistance Sitting-balance support: No upper extremity supported;Feet supported Sitting balance-Leahy Scale: Good     Standing balance support: Single extremity supported Standing balance-Leahy Scale: Poor Standing balance comment: UE support for static standing                            Cognition Arousal/Alertness: Awake/alert Behavior During Therapy: WFL for tasks assessed/performed Overall Cognitive Status: Within Functional Limits for tasks assessed                                        Exercises      General Comments        Pertinent Vitals/Pain Pain Assessment: No/denies pain    Home Living                      Prior Function            PT Goals (current goals can now be found in the care plan section) Progress towards PT  goals: Progressing toward goals    Frequency    Min 4X/week      PT Plan Current plan remains appropriate    Co-evaluation              AM-PAC PT "6 Clicks" Daily Activity  Outcome Measure  Difficulty turning over in bed (including adjusting bedclothes, sheets and blankets)?: None Difficulty moving from lying on back to sitting on the side of the bed? : None Difficulty sitting down on and standing up from a chair with arms (e.g., wheelchair, bedside commode, etc,.)?: Unable Help needed moving to  and from a bed to chair (including a wheelchair)?: A Little Help needed walking in hospital room?: A Little Help needed climbing 3-5 steps with a railing? : A Lot 6 Click Score: 17    End of Session Equipment Utilized During Treatment: Gait belt Activity Tolerance: Patient tolerated treatment well Patient left: in bed;with call bell/phone within reach Nurse Communication: Mobility status PT Visit Diagnosis: Unsteadiness on feet (R26.81);Other abnormalities of gait and mobility (R26.89);Ataxic gait (R26.0)     Time: 2957-4734 PT Time Calculation (min) (ACUTE ONLY): 21 min  Charges:  $Gait Training: 8-22 mins                    G Codes:       [facility] PT [phone]    Angelina Ok [facility] 03/05/2017, 4:40 PM

## 2017-03-05 NOTE — Progress Notes (Signed)
STROKE TEAM PROGRESS NOTE   HISTORY OF PRESENT ILLNESS (per record) Logan [name] is a 41 y.o. male with a history of CAD and MI who presents with unsteadiness/hemianopia. He was in his normal state of health yesterday, but noticed that he was beginning to have some left sided weakness prior ot bed around 11pm. This morning, he felt significantly worse and therefore presents via EMS.   LKW: 11 pm 8/30 tpa given?: no, out of window Modified Rankin Score: 1   SUBJECTIVE (INTERVAL HISTORY)  No acute changes, awaiting placement   OBJECTIVE Temp:  [98.1 F (36.7 C)-98.6 F (37 C)] 98.6 F (37 C) (09/02 0004) Pulse Rate:  [37-116] 116 (09/02 0700) Cardiac Rhythm: Normal sinus rhythm (09/02 0726) Resp:  [11-21] 15 (09/02 0700) BP: (105-132)/(39-110) 132/71 (09/02 0700) SpO2:  [92 %-100 %] 100 % (09/02 0700) Arterial Line BP: (160-162)/(63-67) 160/67 (09/01 0900) Weight:  [151 lb 3.8 oz (68.6 kg)] 151 lb 3.8 oz (68.6 kg) (09/01 2207)  Examination: Cardiovascular: Normal rate and regular rhythm.  Respiratory: Effort normal and breath sounds normal to anterior ascultation GI: Soft.  No distension. There is no tenderness.   Neuro: Mental Status: Patient is awake, alert, oriented to person, place, month, year, and situation. Patient is able to give a clear and coherent history. No signs of aphasia or neglect Cranial Nerves: II: He has a dense right hemianopia. Pupils are equal, round, and reactive to light.   III,IV, VI: EOMI without ptosis or diploplia.  V: Facial sensation is symmetric to temperature VII: Facial movement is symmetric.  VIII: hearing is intact to voice X: Uvula elevates symmetrically XI: Shoulder shrug is symmetric. XII: tongue is midline without atrophy or fasciculations.  Motor: He has 2/5 left sided weakness, 5/5 on the right.  Sensory: Sensation is symmetric to light touch  in the arms and legs. Cerebellar: He has right sided ataxia in the arm and leg.   CBC:   Recent Labs Lab 03/03/17 0710 03/03/17 0718 03/04/17 0429  WBC 11.0*  --  10.0  NEUTROABS 9.4*  --  6.6  HGB 14.5 15.0 13.1  HCT 41.3 44.0 38.6*  MCV 97.9  --  97.7  PLT 260  --  254    Basic Metabolic Panel:   Recent Labs Lab 03/03/17 0710 03/03/17 0718 03/04/17 0429  NA 136 138 138  K 4.3 4.3 3.7  CL 103 101 109  CO2 25  --  23  GLUCOSE 155* 156* 87  BUN 13 15 7   CREATININE 0.93 0.80 0.65  CALCIUM 9.1  --  8.6*  MG  --   --  1.9  PHOS  --   --  3.1    Lipid Panel:     Component Value Date/Time   CHOL 174 03/04/2017 0430   TRIG 110 03/04/2017 0430   HDL 39 (L) 03/04/2017 0430   CHOLHDL 4.5 03/04/2017 0430   VLDL 22 03/04/2017 0430   LDLCALC 113 (H) 03/04/2017 0430   HgbA1c:  Lab Results  Component Value Date   HGBA1C 5.1 03/04/2017   Urine Drug Screen:     Component Value Date/Time   LABOPIA NONE DETECTED 03/03/2017 1625   COCAINSCRNUR NONE DETECTED 03/03/2017 1625   COCAINSCRNUR NEGATIVE 01/23/2012 1227   LABBENZ NONE DETECTED 03/03/2017 1625   LABBENZ NEGATIVE 01/23/2012 1227   AMPHETMU NONE DETECTED 03/03/2017 1625   THCU POSITIVE (A) 03/03/2017 1625   LABBARB NONE DETECTED 03/03/2017 1625    Alcohol Level  Component Value Date/Time   ETH <5 01/06/2016 1920    IMAGING  Ct Angio Head W Or Wo Contrast  Ct Angio Neck W Or Wo Contrast Ct Cerebral Perfusion W Contrast 03/03/2017 1. Right proximal P2 occlusion.   By cerebral perfusion there is 13 cc of right occipital infarct and 64 cc of ischemia - 51 cc penumbra.  2. Mild atheromatous changes. No flow limiting stenosis or embolic source identified more proximally.  3. Right superior cerebellar infarct. The right superior cerebellar artery has recannulized.     Ct Head Wo Contrast 03/03/2017 1. No acute finding after right PCA revascularization.  2. A right superior cerebellar infarct is obscured due to enhancement. No visible occipital infarct.     Dg Chest Port 1  View 03/03/2017 1. No radiographic evidence of acute cardiopulmonary disease.  2. Worsening cardiomegaly.  3. Aortic atherosclerosis.    Ct Head Code Stroke Wo Contrast 03/03/2017 1. Acute right superior cerebellar infarct.  2. Hyperdense right P2 without definitively seen occipital infarct.    Interventional Neuro Radiology S/P RT common carotid areriogramand RT Vert artery angiogram followed by complete revascularization  of occluded RT PCA P1 seg with x1 pass with solitaire 4mm x 40 mm retriever device  with reestablishment if TICI 3 reperfusion.     PHYSICAL EXAM Vitals:   03/05/17 0400 03/05/17 0500 03/05/17 0600 03/05/17 0700  BP: 120/80 111/72 105/71 132/71  Pulse: (!) 59 63 (!) 59 (!) 116  Resp: 13 14 15 15   Temp:      TempSrc:      SpO2: 100% 100% 99% 100%  Weight:      Height:             ASSESSMENT/PLAN [name] is a 41 y.o. male with history of tobacco use, previous stroke, medical noncompliance, coronary artery disease with MI, moderate mitral regurgitation, left ventricular mural thrombus in 2014, ischemic cardiomyopathy, Xarelto therapy, hyperlipidemia, and congestive heart failure presenting with left-sided weakness, unsteadiness, and hemianopia. He did not receive IV t-PA due to late presentation.  Interventional Neuro Radiology Complete revascularization of occluded RT PCA P1 seg with solitaire device and reestablishment if TICI 3 reperfusion.  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  Resultant   CT head - right superior cerebellar infarct.   MRI head - pending  MRA head - not performed  Carotid Doppler - CTA H&N  2D Echo - LVEF may be lower at   20-25% with global hypokinesis and regional variation. There is moderate MR and mildly reduced RV systolic function. No apical mural thrombus noted with Definity contrast.  LDL - 113  HgbA1c - 5.1  VTE prophylaxis - SCDs Diet Heart Room  service appropriate? Yes; Fluid consistency: Thin  aspirin 81 mg daily and Xarelto (rivaroxaban) daily prior to admission, now on aspirin 325 mg daily  Patient counseled to be compliant with his antithrombotic medications  Ongoing aggressive stroke risk factor management  Therapy recommendations: pending  Disposition:  Pending  Hypertension  Stable  Permissive hypertension (OK if < 220/120) but gradually normalize in 5-7 days  Long-term BP goal normotensive  Hyperlipidemia  Home meds: No lipid lowering medications prior to admission  LDL 113, goal < 70  Now on Lipitor 80 mg daily  Continue statin at discharge   Other Stroke Risk Factors  Cigarette smoker - advised to stop smoking  ETOH use, advised to drink no more than 1 - 2 drink per day  Hx stroke/TIA  Coronary artery disease  Cardiomyopathy   Other Active Problems  UDS - THC  History of ischemic cardiomyopathy and left ventricular mural thrombus in 2014.  History of medical noncompliance  Restart Xarelto soon  Hospital day # 2    To contact Stroke Continuity provider, please refer to [email or web address]. After hours, contact General Neurology

## 2017-03-05 NOTE — Plan of Care (Signed)
Problem: Coping: Goal: Ability to identify appropriate support needs will improve Outcome: Progressing Patient identifies brother in law and nephew as sources of support post discharge from inpatient and rehab

## 2017-03-06 ENCOUNTER — Inpatient Hospital Stay (HOSPITAL_COMMUNITY): Payer: Medicare Other

## 2017-03-06 ENCOUNTER — Encounter (HOSPITAL_COMMUNITY): Payer: Self-pay | Admitting: Radiology

## 2017-03-06 DIAGNOSIS — I63331 Cerebral infarction due to thrombosis of right posterior cerebral artery: Secondary | ICD-10-CM

## 2017-03-06 MED ORDER — ACETAMINOPHEN 325 MG PO TABS
650.0000 mg | ORAL_TABLET | Freq: Four times a day (QID) | ORAL | Status: DC | PRN
Start: 2017-03-06 — End: 2017-03-07
  Administered 2017-03-06: 650 mg via ORAL
  Filled 2017-03-06: qty 2

## 2017-03-06 NOTE — Progress Notes (Signed)
Physical Therapy Treatment Patient Details Name: Logan [name] MRN: [id number] DOB: 19-Oct-1975 Today's Date: 03/06/2017    History of Present Illness Patient is a 42 yr old male admitted 03/03/17 with AMS and Lt sided weakness, left facial droop, slurred speech and Left sided gaze.  Patient with evolving RT SCA ischemic stroke.  Patient s/p revascularization in IR.    PMH:    Anginal pain; CAD; Chronic systolic CHF; Hyperlipidemia; Ischemic cardiomyopathy; LV mural thrombus; Moderate mitral regurgitation; Myocardial infarction; Noncompliance; Stroke; Syncope; and Tobacco abuse; CABG    PT Comments    Patient progressing well towards PT goals. Continues to demonstrate ataxia during gait training requiring Mod A for support/balance. Pt with LOB x2-3 during head turns or when distracted. Pt with posterior bias standing statically and able to correct with cues. No stepping strategy or stepping outside BoS when playing catch due to fear of falling. Pt with multiple seated rest breaks due to fatigue. Continues to require extensive rehab so pt can maximize independence. Great CIR candidate. Will follow.  Follow Up Recommendations  CIR     Equipment Recommendations  None recommended by PT    Recommendations for Other Services       Precautions / Restrictions Precautions Precautions: Fall Restrictions Weight Bearing Restrictions: No    Mobility  Bed Mobility Overal bed mobility: Modified Independent       Supine to sit: Modified independent (Device/Increase time);HOB elevated Sit to supine: Modified independent (Device/Increase time)   General bed mobility comments: Incr time, no assist needed.  Transfers Overall transfer level: Needs assistance Equipment used: 1 person hand held assist Transfers: Sit to/from Stand Sit to Stand: Min assist         General transfer comment: Min A to steady in standing due to ataxia. Stood from EOB x3, transferred from chair post  ambulation.  Ambulation/Gait Ambulation/Gait assistance: Mod assist Ambulation Distance (Feet): 150 Feet Assistive device: 1 person hand held assist Gait Pattern/deviations: Step-through pattern;Decreased stride length;Ataxic;Drifts right/left Gait velocity: decr Gait velocity interpretation: Below normal speed for age/gender General Gait Details: Ataxic like gait most notably in RLE. Slow, guarded gait with Min-Mod A for stability. LOB x2 with head turns. Balance worsens with challenges and when dual tasking.    Stairs            Wheelchair Mobility    Modified Rankin (Stroke Patients Only) Modified Rankin (Stroke Patients Only) Pre-Morbid Rankin Score: No symptoms Modified Rankin: Moderately severe disability     Balance Overall balance assessment: Needs assistance Sitting-balance support: No upper extremity supported;Feet supported Sitting balance-Leahy Scale: Good     Standing balance support: During functional activity;Single extremity supported Standing balance-Leahy Scale: Poor Standing balance comment: When standing and looking at bulletin board to find objects on it--he had a posterior bias (Mod A for balance, self corrected with cues and at times self corrected without cues). In standing and working on catching objects--but did not want to reach or step out of base of support due to knowing his balance is off (so min A for balance within base of support)                            Cognition Arousal/Alertness: Awake/alert Behavior During Therapy: WFL for tasks assessed/performed Overall Cognitive Status: Within Functional Limits for tasks assessed  Exercises      General Comments General comments (skin integrity, edema, etc.): VSS.      Pertinent Vitals/Pain Pain Assessment: No/denies pain    Home Living                      Prior Function            PT Goals (current  goals can now be found in the care plan section) Progress towards PT goals: Progressing toward goals    Frequency    Min 4X/week      PT Plan Current plan remains appropriate    Co-evaluation PT/OT/SLP Co-Evaluation/Treatment: Yes Reason for Co-Treatment: To address functional/ADL transfers;For patient/therapist safety PT goals addressed during session: Mobility/safety with mobility OT goals addressed during session: ADL's and self-care;Strengthening/ROM      AM-PAC PT "6 Clicks" Daily Activity  Outcome Measure  Difficulty turning over in bed (including adjusting bedclothes, sheets and blankets)?: None Difficulty moving from lying on back to sitting on the side of the bed? : None Difficulty sitting down on and standing up from a chair with arms (e.g., wheelchair, bedside commode, etc,.)?: Unable Help needed moving to and from a bed to chair (including a wheelchair)?: A Little Help needed walking in hospital room?: A Lot Help needed climbing 3-5 steps with a railing? : A Lot 6 Click Score: 16    End of Session Equipment Utilized During Treatment: Gait belt Activity Tolerance: Patient tolerated treatment well Patient left: in chair;with call bell/phone within reach Nurse Communication: Mobility status PT Visit Diagnosis: Unsteadiness on feet (R26.81);Other abnormalities of gait and mobility (R26.89);Ataxic gait (R26.0)     Time: 8185-6314 PT Time Calculation (min) (ACUTE ONLY): 27 min  Charges:  $Neuromuscular Re-education: 8-22 mins                    G Codes:       Mylo Red, PT, DPT [phone]     Blake Divine A Anijah Spohr 03/06/2017, 12:26 PM

## 2017-03-06 NOTE — Progress Notes (Signed)
Patient arrived to unit.  Assisted to room chair call light in reach.  Alert, oriented no complaints of pain or discomfort.

## 2017-03-06 NOTE — Plan of Care (Signed)
Problem: Self-Care: Goal: Ability to participate in self-care as condition permits will improve Outcome: Progressing Discussed with patient plan of care for the evening, pain management and mobility with some teach back displayed. Discussed with patient about his difficulty in ambulating without assistance at this time. Patient coping well with deficits from recent stroke. Patient in good spirits and highly motivated.

## 2017-03-06 NOTE — Progress Notes (Signed)
Report given to recieving RN.

## 2017-03-06 NOTE — Progress Notes (Signed)
Will have first shift RN call to see about MR Brain to check status of obtaining.

## 2017-03-06 NOTE — Progress Notes (Signed)
//  Inpatient Rehabilitation  Met with patient to discuss team's recommendation for IP Rehab.  Shared booklets and answered questions.  Note updated PT/OT notes with on going therapy needs.  Patient in agreement with plan for IP Rehab.  Plan to initiate insurance authorization tomorrow,  03/07/17 morning.  Will follow for timing of medical readiness, insurance authorization, and IP Rehab bed availability.  Please call with questions.    Carmelia Roller., CCC/SLP Admission Coordinator  [place]  Cell [phone]

## 2017-03-06 NOTE — Progress Notes (Signed)
Occupational Therapy Treatment Patient Details Name: Logan [name] MRN: [id number] DOB: 10-20-1975 Today's Date: 03/06/2017    History of present illness Patient is a 41 yr old male admitted 03/03/17 with AMS and Lt sided weakness, left facial droop, slurred speech and Left sided gaze.  Patient with evolving RT SCA ischemic stroke.  Patient s/p revascularization in IR.    PMH:    Anginal pain; CAD; Chronic systolic CHF; Hyperlipidemia; Ischemic cardiomyopathy; LV mural thrombus; Moderate mitral regurgitation; Myocardial infarction; Noncompliance; Stroke; Syncope; and Tobacco abuse; CABG   OT comments  This 41 yo male admitted with above presents to acute OT today making progress with basic ADLs and transfers with visual field cut not interfering with function. He is still very unsteady when up on his feet thus making him an increased fall risk and decreased independence with basic ADLs. Feel with inpatient rehab stay he will get back to his Independent level.  Follow Up Recommendations  CIR;Supervision/Assistance - 24 hour    Equipment Recommendations  3 in 1 bedside commode       Precautions / Restrictions Precautions Precautions: Fall Restrictions Weight Bearing Restrictions: No       Mobility Bed Mobility Overal bed mobility: Modified Independent                Transfers Overall transfer level: Needs assistance Equipment used: 1 person hand held assist Transfers: Sit to/from Stand Sit to Stand: Min assist              Balance   Sitting-balance support: No upper extremity supported;Feet supported Sitting balance-Leahy Scale: Good     Standing balance support: Single extremity supported (or support of gait belt) Standing balance-Leahy Scale: Poor Standing balance comment: When standing and looking at bulletin board to find objects on it--he had a posterior bias (Mod A for balance, self corrected with cues and at times self corrected without cues). In standing and  working on catching objects--but did not want to reach or step out of base of support due to he knows his balance is off (so min A for balance within base of support)                           ADL either performed or assessed with clinical judgement   ADL Overall ADL's : Needs assistance/impaired     Grooming: Oral care Grooming Details (indicate cue type and reason): min A for standing balance; could not step of water pedal and keep water on while also brushing his teeth                 Toilet Transfer: Moderate assistance;Ambulation Toilet Transfer Details (indicate cue type and reason): bed>around and out into hall>back to room                 Vision Baseline Vision/History: Wears glasses Wears Glasses:  (pt state he should have them for distance and at night, but does not have any) Patient Visual Report: No change from baseline Additional Comments: had pt stand in front of bullentin board and find items I asked of him--100% accuracy          Cognition Arousal/Alertness: Awake/alert Behavior During Therapy: WFL for tasks assessed/performed Overall Cognitive Status: Within Functional Limits for tasks assessed  Pertinent Vitals/ Pain       Pain Assessment: No/denies pain         Frequency  Min 3X/week        Progress Toward Goals  OT Goals(current goals can now be found in the care plan section)  Progress towards OT goals: Progressing toward goals     Plan Discharge plan remains appropriate    Co-evaluation    PT/OT/SLP Co-Evaluation/Treatment: Yes Reason for Co-Treatment: To address functional/ADL transfers;For patient/therapist safety PT goals addressed during session: Mobility/safety with mobility OT goals addressed during session: ADL's and self-care;Strengthening/ROM      AM-PAC PT "6 Clicks" Daily Activity     Outcome Measure   Help from another person  eating meals?: None Help from another person taking care of personal grooming?: A Little Help from another person toileting, which includes using toliet, bedpan, or urinal?: A Lot Help from another person bathing (including washing, rinsing, drying)?: A Little Help from another person to put on and taking off regular upper body clothing?: A Little Help from another person to put on and taking off regular lower body clothing?: A Lot 6 Click Score: 17    End of Session Equipment Utilized During Treatment: Gait belt  OT Visit Diagnosis: Unsteadiness on feet (R26.81);Other abnormalities of gait and mobility (R26.89);Muscle weakness (generalized) (M62.81)   Activity Tolerance Patient tolerated treatment well   Patient Left in chair;with call bell/phone within reach           Time: 9604-5409 OT Time Calculation (min): 28 min  Charges: OT General Charges $OT Visit: 1 Visit OT Treatments $Self Care/Home Management : 8-22 mins  Ignacia Palma, OTR/L [phone] 03/06/2017

## 2017-03-06 NOTE — Progress Notes (Signed)
Patient complained of right rib cage pain as he woke up at 0715. Patient complained it was hard to breathe because of the pain. Repositioned patient and gave acetaminophen solution 650mg  at 0722. 1st shift made aware and they will follow-up.

## 2017-03-06 NOTE — Progress Notes (Signed)
STROKE TEAM PROGRESS NOTE   HISTORY OF PRESENT ILLNESS (per record) Logan [name] is a 41 y.o. male with a history of CAD and MI who presents with unsteadiness/hemianopia. He was in his normal state of health yesterday, but noticed that he was beginning to have some left sided weakness prior ot bed around 11pm. This morning, he felt significantly worse and therefore presents via EMS.   LKW: 11 pm 8/30 tpa given?: no, out of window Modified Rankin Score: 1   SUBJECTIVE (INTERVAL HISTORY)  No acute changes,He is sitting in bedside chair he has no complaints. awaiting placement        CBC:   Recent Labs Lab 03/03/17 0710 03/03/17 0718 03/04/17 0429  WBC 11.0*  --  10.0  NEUTROABS 9.4*  --  6.6  HGB 14.5 15.0 13.1  HCT 41.3 44.0 38.6*  MCV 97.9  --  97.7  PLT 260  --  254    Basic Metabolic Panel:   Recent Labs Lab 03/03/17 0710 03/03/17 0718 03/04/17 0429  NA 136 138 138  K 4.3 4.3 3.7  CL 103 101 109  CO2 25  --  23  GLUCOSE 155* 156* 87  BUN 13 15 7   CREATININE 0.93 0.80 0.65  CALCIUM 9.1  --  8.6*  MG  --   --  1.9  PHOS  --   --  3.1    Lipid Panel:     Component Value Date/Time   CHOL 174 03/04/2017 0430   TRIG 110 03/04/2017 0430   HDL 39 (L) 03/04/2017 0430   CHOLHDL 4.5 03/04/2017 0430   VLDL 22 03/04/2017 0430   LDLCALC 113 (H) 03/04/2017 0430   HgbA1c:  Lab Results  Component Value Date   HGBA1C 5.1 03/04/2017   Urine Drug Screen:     Component Value Date/Time   LABOPIA NONE DETECTED 03/03/2017 1625   COCAINSCRNUR NONE DETECTED 03/03/2017 1625   COCAINSCRNUR NEGATIVE 01/23/2012 1227   LABBENZ NONE DETECTED 03/03/2017 1625   LABBENZ NEGATIVE 01/23/2012 1227   AMPHETMU NONE DETECTED 03/03/2017 1625   THCU POSITIVE (A) 03/03/2017 1625   LABBARB NONE DETECTED 03/03/2017 1625    Alcohol Level     Component Value Date/Time   ETH <5 01/06/2016 1920    IMAGING  Ct Angio Head W Or Wo Contrast  Ct Angio Neck W Or Wo Contrast Ct  Cerebral Perfusion W Contrast 03/03/2017 1. Right proximal P2 occlusion.   By cerebral perfusion there is 13 cc of right occipital infarct and 64 cc of ischemia - 51 cc penumbra.  2. Mild atheromatous changes. No flow limiting stenosis or embolic source identified more proximally.  3. Right superior cerebellar infarct. The right superior cerebellar artery has recannulized.     Ct Head Wo Contrast 03/03/2017 1. No acute finding after right PCA revascularization.  2. A right superior cerebellar infarct is obscured due to enhancement. No visible occipital infarct.     Dg Chest Port 1 View 03/03/2017 1. No radiographic evidence of acute cardiopulmonary disease.  2. Worsening cardiomegaly.  3. Aortic atherosclerosis.    Ct Head Code Stroke Wo Contrast 03/03/2017 1. Acute right superior cerebellar infarct.  2. Hyperdense right P2 without definitively seen occipital infarct.    Interventional Neuro Radiology S/P RT common carotid areriogramand RT Vert artery angiogram followed by complete revascularization  of occluded RT PCA P1 seg with x1 pass with solitaire 52mm x 40 mm retriever device  with reestablishment if TICI 3 reperfusion.  PHYSICAL EXAM Vitals:   03/06/17 0600 03/06/17 0722 03/06/17 0800 03/06/17 1156  BP: 122/75 123/64 108/60 127/68  Pulse: 61 72 71 73  Resp: 19 12 (!) 21 (!) 21  Temp:  98.5 F (36.9 C)  98.1 F (36.7 C)  TempSrc:  Oral  Oral  SpO2: 99% 100% 100% 100%  Weight:      Height:       Temp:  [98 F (36.7 C)-99.6 F (37.6 C)] 98.1 F (36.7 C) (09/03 1156) Pulse Rate:  [41-88] 73 (09/03 1156) Cardiac Rhythm: Normal sinus rhythm (09/03 0729) Resp:  [12-22] 21 (09/03 1156) BP: (106-139)/(57-85) 127/68 (09/03 1156) SpO2:  [96 %-100 %] 100 % (09/03 1156)   . Afebrile. Head is nontraumatic. Neck is supple without bruit.    Cardiac exam no murmur or gallop. Lungs are clear to auscultation. Distal pulses are well felt.  Neurological Exam ;  Awake   Alert oriented x 3. Normal speech and language.eye movements full without nystagmus.fundi were not visualized. Vision acuity   appears normal But dense left homonymous hemianopsia.Marland Kitchen Hearing is normal. Palatal movements are normal. Face symmetric. Tongue midline. Normal strength, tone, reflexes and coordination. Normal sensation. Gait deferred.  ASSESSMENT/PLAN [name] is a 41 y.o. male with history of tobacco use, previous stroke, medical noncompliance, coronary artery disease with MI, moderate mitral regurgitation, left ventricular mural thrombus in 2014, ischemic cardiomyopathy, Xarelto therapy, hyperlipidemia, and congestive heart failure presenting with left-sided weakness, unsteadiness, and hemianopia. He did not receive IV t-PA due to late presentation.  Interventional Neuro Radiology Complete revascularization of occluded RT PCA P1 seg with solitaire device and reestablishment if TICI 3 reperfusion.  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  Resultant   CT head - right superior cerebellar infarct.   MRI head - pending  MRA head - not performed  Carotid Doppler - CTA H&N  2D Echo - LVEF may be lower at   20-25% with global hypokinesis and regional variation. There is moderate MR and mildly reduced RV systolic function. No apical mural thrombus noted with Definity contrast.  LDL - 113  HgbA1c - 5.1  VTE prophylaxis - SCDs Diet Heart Room service appropriate? Yes; Fluid consistency: Thin  aspirin 81 mg daily and Xarelto (rivaroxaban) daily prior to admission, now on aspirin 325 mg daily  Patient counseled to be compliant with his antithrombotic medications  Ongoing aggressive stroke risk factor management  Therapy recommendations: pending  Disposition:  Pending  Hypertension  Stable  Permissive hypertension (OK if < 220/120) but gradually normalize in 5-7 days  Long-term BP goal  normotensive  Hyperlipidemia  Home meds: No lipid lowering medications prior to admission  LDL 113, goal < 70  Now on Lipitor 80 mg daily  Continue statin at discharge   Other Stroke Risk Factors  Cigarette smoker - advised to stop smoking  ETOH use, advised to drink no more than 1 - 2 drink per day  Hx stroke/TIA  Coronary artery disease  Cardiomyopathy   Other Active Problems  UDS - THC  History of ischemic cardiomyopathy and left ventricular mural thrombus in 2014.  History of medical noncompliance  Restart Xarelto soon  Hospital day # 3  I have personally examined this patient, reviewed notes, independently viewed imaging studies, participated in medical decision making and plan of care.ROS completed by me personally and pertinent positives fully documented  I have made any additions or clarifications directly to the above note.  Recommend mobilize out of bed. Physical occupational therapy consults. Transfer to neurology floor bed. Check CT scan and if no bleed start anticoagulation with xarelto. Patient counseled to be compliant with Xarelto and to take it with a meal at the same time everyday. Greater than 50% time during this 35 minute visit was spent on counseling and coordination of care about his embolic stroke and answering questions Delia Heady, MD Medical Director [facility] Pager: [phone] 03/06/2017 3:11 PM   To contact Stroke Continuity provider, please refer to [email or web address]. After hours, contact General Neurology

## 2017-03-06 NOTE — Progress Notes (Signed)
Report attempted. Will call back in 15 min.

## 2017-03-07 ENCOUNTER — Inpatient Hospital Stay (HOSPITAL_COMMUNITY): Payer: Medicare Other

## 2017-03-07 ENCOUNTER — Encounter (HOSPITAL_COMMUNITY): Payer: Self-pay | Admitting: Interventional Radiology

## 2017-03-07 DIAGNOSIS — I63549 Cerebral infarction due to unspecified occlusion or stenosis of unspecified cerebellar artery: Secondary | ICD-10-CM

## 2017-03-07 LAB — BASIC METABOLIC PANEL WITH GFR
Anion gap: 7 (ref 5–15)
BUN: 8 mg/dL (ref 6–20)
CO2: 21 mmol/L — ABNORMAL LOW (ref 22–32)
Calcium: 8.4 mg/dL — ABNORMAL LOW (ref 8.9–10.3)
Chloride: 110 mmol/L (ref 101–111)
Creatinine, Ser: 0.7 mg/dL (ref 0.61–1.24)
GFR calc Af Amer: >60 mL/min
GFR calc non Af Amer: >60 mL/min
Glucose, Bld: 80 mg/dL (ref 65–99)
Potassium: 3.4 mmol/L — ABNORMAL LOW (ref 3.5–5.1)
Sodium: 138 mmol/L (ref 135–145)

## 2017-03-07 LAB — MAGNESIUM: Magnesium: 1.7 mg/dL (ref 1.7–2.4)

## 2017-03-07 MED ORDER — ACETAMINOPHEN-CODEINE #3 300-30 MG PO TABS
1.0000 | ORAL_TABLET | Freq: Four times a day (QID) | ORAL | Status: DC | PRN
  Administered 2017-03-07 – 2017-03-08 (×2): 1 via ORAL
  Filled 2017-03-07 (×2): qty 1

## 2017-03-07 NOTE — Progress Notes (Signed)
Inpatient Rehabilitation  I have initiated insurance authorization; however, require updated SLP notes from today.  Plan to continue to follow for timing of medical readiness, insurance authorization, and IP Rehab bed availability.    Charlane Ferretti., CCC/SLP Admission Coordinator  [facility] Inpatient Rehabilitation  Cell [phone]

## 2017-03-07 NOTE — Progress Notes (Signed)
PT Cancellation Note  Patient Details Name: [name] MRN: [id number] DOB: 10-02-1975   Cancelled Treatment:    Reason Eval/Treat Not Completed: Patient at procedure or test/unavailable, to MRI   Fabio Asa 03/07/2017, 2:21 PM Charlotte Crumb, PT DPT  Board Certified Neurologic Specialist [phone]

## 2017-03-07 NOTE — Care Management Note (Signed)
Case Management Note  Patient Details  Name: Logan [name] MRN: [id number] Date of Birth: [birth year]  Subjective/Objective:   Pt admitted with CVA. He is from home with brother in law.               Action/Plan: PT/OT recommending CIR. CM following for d/c disposition.  Expected Discharge Date:                  Expected Discharge Plan:  IP Rehab Facility  In-House Referral:     Discharge planning Services  CM Consult  Post Acute Care Choice:    Choice offered to:     DME Arranged:    DME Agency:     HH Arranged:    HH Agency:     Status of Service:  In process, will continue to follow  If discussed at Long Length of Stay Meetings, dates discussed:    Additional Comments:  Kermit Balo, RN 03/07/2017, 10:51 AM

## 2017-03-07 NOTE — Care Management Important Message (Signed)
Important Message  Patient Details  Name: Logan [name] MRN: [id number] Date of Birth: [birth year]   Medicare Important Message Given:  Yes    Elam Ellis 03/07/2017, 1:40 PM

## 2017-03-07 NOTE — Progress Notes (Addendum)
Was called by nursing regarding brief asymptomatic run of V. Tach. I have asked for BMP, magnesium. Continue to monitor.  Ritta Slot, MD [facility] [phone]  If 7pm- 7am, please page neurology on call as listed in AMION.

## 2017-03-07 NOTE — Progress Notes (Signed)
  Speech Language Pathology Treatment: Cognitive-Linquistic  Patient Details Name: Logan [name] MRN: [id number] DOB: 05-10-1976 Today's Date: 03/07/2017 Time: 2122-4825 SLP Time Calculation (min) (ACUTE ONLY): 23 min  Assessment / Plan / Recommendation Clinical Impression  Skilled treatment session focused on cognition and speech communication goals. 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 SLP facilitated session by providing Min A for use of speech intelligibility strategies to achieve speech intelligibility of ~ 75 at sentence level. Pt's speech is not at baseline and interferes with his ability to independently communicate within his daily life. Pt continues to exhibit Mild cognitive deficits in the areas of mildly complex problem solving, alternating attention, emergent/anticipatory awareness d/t overall delayed processing of cognitive information. Pt with delayed responses to questions regarding current abilities/deficits. At baseline, pt managed his own finances, medication and household activities. At this point, pt is unable to due so d/t the above mentioned cognitive and dysarthric impairments. Recommend CIR to increase functional independence and safety at discharge.    HPI HPI: Patient is a 41 yr old male admitted 03/03/17 with AMS and Lt sided weakness, left facial droop, slurred speech and Left sided gaze.  Patient with evolving RT SCA ischemic stroke.  Patient s/p revascularization in IR.    PMH:    Anginal pain; CAD; Chronic systolic CHF; Hyperlipidemia; Ischemic cardiomyopathy; LV mural thrombus; Moderate mitral regurgitation; Myocardial infarction; Noncompliance; Stroke; Syncope; and Tobacco abuse; CABG       SLP Plan  Continue with current plan of care       Recommendations                   Follow up Recommendations: Inpatient Rehab SLP Visit Diagnosis: Dysarthria and anarthria (R47.1);Cognitive communication deficit (R41.841) Plan: Continue with current  plan of care       GO                Mahmud Keithly 03/07/2017, 10:27 AM

## 2017-03-07 NOTE — Progress Notes (Signed)
Inpatient Rehabilitation  I have faxed updated SLP notes to [facility] Medicare authorization case manager and await response.  Plan to follow up with team when there has been a decision.  Please call with questions.   Charlane Ferretti., CCC/SLP Admission Coordinator  [facility] Inpatient Rehabilitation  Cell [phone]

## 2017-03-07 NOTE — Progress Notes (Signed)
STROKE TEAM PROGRESS NOTE   HISTORY OF PRESENT ILLNESS (per record) Logan [name] is a 41 y.o. male with a history of CAD and MI who presents with unsteadiness/hemianopia. He was in his normal state of health yesterday, but noticed that he was beginning to have some left sided weakness prior ot bed around 11pm. This morning, he felt significantly worse and therefore presents via EMS.   LKW: 11 pm 8/30 tpa given?: no, out of window Modified Rankin Score: 1   SUBJECTIVE (INTERVAL HISTORY)  No acute changes,He is sitting in bedside chair he has no complaints.Repeat Ct head y`day showed moderate size infarct with some hemorrhagic transformation        CBC:   Recent Labs Lab 03/03/17 0710 03/03/17 0718 03/04/17 0429  WBC 11.0*  --  10.0  NEUTROABS 9.4*  --  6.6  HGB 14.5 15.0 13.1  HCT 41.3 44.0 38.6*  MCV 97.9  --  97.7  PLT 260  --  254    Basic Metabolic Panel:   Recent Labs Lab 03/04/17 0429 03/07/17 0519  NA 138 138  K 3.7 3.4*  CL 109 110  CO2 23 21*  GLUCOSE 87 80  BUN 7 8  CREATININE 0.65 0.70  CALCIUM 8.6* 8.4*  MG 1.9 1.7  PHOS 3.1  --     Lipid Panel:     Component Value Date/Time   CHOL 174 03/04/2017 0430   TRIG 110 03/04/2017 0430   HDL 39 (L) 03/04/2017 0430   CHOLHDL 4.5 03/04/2017 0430   VLDL 22 03/04/2017 0430   LDLCALC 113 (H) 03/04/2017 0430   HgbA1c:  Lab Results  Component Value Date   HGBA1C 5.1 03/04/2017   Urine Drug Screen:     Component Value Date/Time   LABOPIA NONE DETECTED 03/03/2017 1625   COCAINSCRNUR NONE DETECTED 03/03/2017 1625   COCAINSCRNUR NEGATIVE 01/23/2012 1227   LABBENZ NONE DETECTED 03/03/2017 1625   LABBENZ NEGATIVE 01/23/2012 1227   AMPHETMU NONE DETECTED 03/03/2017 1625   THCU POSITIVE (A) 03/03/2017 1625   LABBARB NONE DETECTED 03/03/2017 1625    Alcohol Level     Component Value Date/Time   ETH <5 01/06/2016 1920    IMAGING  Ct Angio Head W Or Wo Contrast  Ct Angio Neck W Or Wo  Contrast Ct Cerebral Perfusion W Contrast 03/03/2017 1. Right proximal P2 occlusion.   By cerebral perfusion there is 13 cc of right occipital infarct and 64 cc of ischemia - 51 cc penumbra.  2. Mild atheromatous changes. No flow limiting stenosis or embolic source identified more proximally.  3. Right superior cerebellar infarct. The right superior cerebellar artery has recannulized.     Ct Head Wo Contrast 03/03/2017 1. No acute finding after right PCA revascularization.  2. A right superior cerebellar infarct is obscured due to enhancement. No visible occipital infarct.     Dg Chest Port 1 View 03/03/2017 1. No radiographic evidence of acute cardiopulmonary disease.  2. Worsening cardiomegaly.  3. Aortic atherosclerosis.    Ct Head Code Stroke Wo Contrast 03/03/2017 1. Acute right superior cerebellar infarct.  2. Hyperdense right P2 without definitively seen occipital infarct.    Interventional Neuro Radiology S/P RT common carotid areriogramand RT Vert artery angiogram followed by complete revascularization  of occluded RT PCA P1 seg with x1 pass with solitaire 100mm x 40 mm retriever device  with reestablishment if TICI 3 reperfusion.   MRI 03/07/2017 :1. Confluent acute infarct in the superior right cerebellum and  right occipital temporal cortex. There is also small areas of infarction in the central right thalamus and left occipital cortex. 2. Right occipital petechial hemorrhage.  No focal hematoma. 3. Local mass effect from cytotoxic edema.  No ventriculomegaly. 4. Restricted diffusion closely follows the right hippocampal formation, possible superimposed seizure phenomenon.    PHYSICAL EXAM Vitals:   03/07/17 0517 03/07/17 0900 03/07/17 1545 03/07/17 1754  BP: 113/67 120/70 (!) 124/57 123/75  Pulse: 64 65 62 71  Resp: 18 18 16 18   Temp: 98.2 F (36.8 C) 98.5 F (36.9 C) 98.3 F (36.8 C) 98.2 F (36.8 C)  TempSrc: Oral Oral Oral Oral  SpO2: 96% 98% 97% 100%   Weight:      Height:       Temp:  [98 F (36.7 C)-99.6 F (37.6 C)] 98.1 F (36.7 C) (09/03 1156) Pulse Rate:  [41-88] 73 (09/03 1156) Cardiac Rhythm: Normal sinus rhythm (09/03 0729) Resp:  [12-22] 21 (09/03 1156) BP: (106-139)/(57-85) 127/68 (09/03 1156) SpO2:  [96 %-100 %] 100 % (09/03 1156)   . Afebrile. Head is nontraumatic. Neck is supple without bruit.    Cardiac exam no murmur or gallop. Lungs are clear to auscultation. Distal pulses are well felt.  Neurological Exam ;  Awake  Alert oriented x 3. Normal speech and language.eye movements full without nystagmus.fundi were not visualized. Vision acuity   appears normal But dense left homonymous hemianopsia.Marland Kitchen Hearing is normal. Palatal movements are normal. Face symmetric. Tongue midline. Normal strength, tone, reflexes and coordination. Normal sensation. Gait deferred.  ASSESSMENT/PLAN [name] is a 41 y.o. male with history of tobacco use, previous stroke, medical noncompliance, coronary artery disease with MI, moderate mitral regurgitation, left ventricular mural thrombus in 2014, ischemic cardiomyopathy, Xarelto therapy, hyperlipidemia, and congestive heart failure presenting with left-sided weakness, unsteadiness, and hemianopia. He did not receive IV t-PA due to late presentation.  Interventional Neuro Radiology Complete revascularization of occluded RT PCA P1 seg with solitaire device and reestablishment if TICI 3 reperfusion.  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  Resultant   CT head - right superior cerebellar infarct.  MRI head - 1. Confluent acute infarct in the superior right cerebellum and right occipital temporal cortex. There is also small areas of infarction in the central right thalamus and left occipital cortex. 2. Right occipital petechial hemorrhage.  No focal hematoma. 3. Local mass effect from cytotoxic edema.  No ventriculomegaly. 4.  Restricted diffusion closely follows the right hippocampal formation, possible superimposed seizure phenomenon.   MRA head - not performed  Carotid Doppler - CTA H&N  2D Echo - LVEF may be lower at   20-25% with global hypokinesis and regional variation. There is moderate MR and mildly reduced RV systolic function. No apical mural thrombus noted with Definity contrast.  LDL - 113  HgbA1c - 5.1  VTE prophylaxis - SCDs Diet Heart Room service appropriate? Yes; Fluid consistency: Thin  aspirin 81 mg daily and Xarelto (rivaroxaban) daily prior to admission, now on aspirin 325 mg daily  Plan to change to anticoagulation after 1 week given large size of infarct and risk for hemorrhagic transformation  Patient counseled to be compliant with his antithrombotic medications  Ongoing aggressive stroke risk factor management  Therapy recommendations: CLR Disposition:  CLR Hypertension  Stable  Permissive hypertension (OK if < 220/120) but gradually normalize in 5-7 days  Long-term BP goal normotensive  Hyperlipidemia  Home meds: No lipid lowering medications prior  to admission  LDL 113, goal < 70  Now on Lipitor 80 mg daily  Continue statin at discharge   Other Stroke Risk Factors  Cigarette smoker - advised to stop smoking  ETOH use, advised to drink no more than 1 - 2 drink per day  Hx stroke/TIA  Coronary artery disease  Cardiomyopathy   Other Active Problems  UDS - THC  History of ischemic cardiomyopathy and left ventricular mural thrombus in 2014.  History of medical noncompliance  Restart Xarelto soon  Hospital day # 4  I have personally examined this patient, reviewed notes, independently viewed imaging studies, participated in medical decision making and plan of care.ROS completed by me personally and pertinent positives fully documented  I have made any additions or clarifications directly to the above note.  Recommend mobilize out of bed. Physical  occupational therapy consults. Transfer to rehab bed. Tomorrow. Hold anticoagulation due to large size of infarct. Greater than 50% time during this 25 minute visit was spent on counseling and coordination of care about his embolic stroke and answering questions Delia Heady, MD Medical Director [facility] Pager: [phone] 03/07/2017 8:40 PM   To contact Stroke Continuity provider, please refer to [email or web address]. After hours, contact General Neurology

## 2017-03-07 NOTE — Progress Notes (Signed)
Inpatient Rehabilitation  I have insurance approval to admit patient to IP Rehab; however, patient is not medically ready at this time.  Plan to follow up tomorrow for hopeful medical readiness and IP Rehab bed availability.  Updated team.  Please call with questions.  Charlane Ferretti., CCC/SLP Admission Coordinator  [facility] Inpatient Rehabilitation  Cell [phone]

## 2017-03-07 NOTE — Progress Notes (Addendum)
Got a call from CCMD that pt had 7 runs of Vtach at [phone], pt quiet and sleeping in bed, denies any discomfort on assessment, Dr Amada Jupiter paged and notified, no new orders at this time, will however continue to monitor. Obasogie-Asidi, Lashya Passe Efe  AM Labs ordered.

## 2017-03-07 NOTE — PMR Pre-admission (Signed)
PMR Admission Coordinator Pre-Admission Assessment  Patient: Logan [name] is an 41 y.o., male MRN: [id number] DOB: [birth year] Height: 5\' 8"  (172.7 cm) Weight: 68.6 kg (151 lb 3.8 oz)              Insurance Information HMO: X    PPO:      PCP:      IPA:      80/20:      OTHER:  PRIMARY: UHC Medicare      Policy#: [id number]      Subscriber: Self CM Name: Rebeca Alert       Phone#: [phone]     Fax#: [phone] Pre-Cert#: [id number]      Employer:  Benefits:  Phone #: Verified online     Name: [facility] online Eff. Date: 07/04/16     Deduct: $0      Out of Pocket Max: [phone]      Life Max: N/A CIR: $0 a day, days 1-90      SNF: $0 a day, days 1-20; $167.50 a day, days 21-100 Outpatient: PT/OT/SLP     Co-Pay: $0 Home Health: PT/OT/SLP      Co-Pay: $0 DME: 80%     Co-Pay: 20% Providers: In-network   SECONDARY: Medicaid [place]      Policy#: [id number]      Subscriber: Self CM Name:       Phone#:      Fax#:  Pre-Cert#:       Employer:  Benefits:  Phone #:      Name:  Eff. Date:      Deduct:       Out of Pocket Max:       Life Max:  CIR:       SNF:  Outpatient:      Co-Pay:  Home Health:       Co-Pay:  DME:      Co-Pay:   Medicaid Application Date:       Case Manager:  Disability Application Date:       Case Worker:   Emergency Contact Information Contact Information    Name Relation Home Work Mobile   Logan [name] Daughter   [phone]     Current Medical History  Patient Admitting Diagnosis: Right cerebellar CVA   History of Present Illness: Logan [name] a 41 y.o.malewith pmh of tobacco and marijuana abuse, TIA, medication noncompliance, CAD s/p MI 3, MVR, ischemic cardiomyopathy and left ventricular mural thrombus in 2014 maintained on Xarelto, GERD, systolic heart failure presented on 8/31 with left-sided weakness, Facial droop and slurred speech. History taken from chart review patient.Patient lives with brother-in-law all reported to be independent prior to admission.  CT head, reviewed, showing right cerebellar CVA. Per report acute right superior cerebellar infarct,? Right occipital infarct. Patient did not receive TPA. Neurology workup initiated.Patient underwent right CEA T1 segment revascularization by VIR. TTE showing EF of 20-25% with global hypokinesis and moderate MR. U tox positive for THC. Patient with persistent associated gait deficits. MRI 03/05/2017 showed confluent acute infarct in the superior right cerebellum and right occipital temporal cortex. Small area of infarction in the central right thalamus and left occipital cortex. Small right occipital petechial hemorrhage. No focal hematoma. Currently maintained on aspirin for CVA prophylaxis and await plan to resume Xarelto. Maintain on a regular diet. Physical therapy evaluation completed. Patient was admitted for comprehensive rehabilitation program 03/08/17.  NIHTotal: 2    Past Medical History  Past Medical History:  Diagnosis Date  . Anginal pain (  HCC)   . CAD (coronary artery disease) 01/22/12   a.anterolateral MI with DES to LAD EF 25% b. reinfarction 8/13 with thrombosis of stent c. s/p CABG x 3: LIMA-LAD, SVG-OM, SVG-RCA (07/2012)   . Chronic systolic CHF (congestive heart failure) (HCC)    a. (07/2012) EF 20-25% diff HK with reg wall abnl, HK anterolat wall, akinesis inferosep, mod MR;  b. 01/2014 Echo: EF 10-15%, glob HK, mod MR.  Marland Kitchen GERD (gastroesophageal reflux disease)   . Hyperlipidemia   . Ischemic cardiomyopathy    a.  01/2014 Echo: EF 10-15%, glob HK;  b. 03/2014 S/P Subcutaneous ICD.  . LV (left ventricular) mural thrombus    "just found it in Oct" (07/09/2012)  . Moderate mitral regurgitation   . Myocardial infarction (HCC) 01/2012; 02/2012; 04/2012  . Noncompliance   . Stroke [facility]) 04/2014   TIA in the setting of medical non-compliance with coumadin requiring hospitalization.  . Syncope   . Tobacco abuse     Family History  family history includes Coronary artery disease in his  brother and father; Heart attack in his brother and father; Heart disease in his brother, father, and mother; Hypertension in his sister.  Prior Rehab/Hospitalizations:  Has the patient had major surgery during 100 days prior to admission? No, 1 fall without injury just prior to admission   Current Medications   Current Facility-Administered Medications:  .  0.9 %  sodium chloride infusion, , Intravenous, Continuous, Deveshwar, Sanjeev, MD, Last Rate: 75 mL/hr at 03/07/17 2047 .  acetaminophen-codeine (TYLENOL #3) 300-30 MG per tablet 1-2 tablet, 1-2 tablet, Oral, Q6H PRN, Logan Riley, MD, 1 tablet at 03/08/17 [id number] .  aspirin tablet 325 mg, 325 mg, Oral, Daily, Logan Brock, MD, 325 mg at 03/08/17 0925 .  atorvastatin (LIPITOR) tablet 80 mg, 80 mg, Oral, q1800, Logan Brock, MD, 80 mg at 03/07/17 1746 .  nitroGLYCERIN 25 mcg in sodium chloride 0.9 % 59.71 mL (25 mcg/mL) syringe, , Intra-arterial, PRN, Logan Cotton, MD, 25 mcg at 03/03/17 0909  Patients Current Diet: Diet Heart Room service appropriate? Yes; Fluid consistency: Thin  Precautions / Restrictions Precautions Precautions: Fall Restrictions Weight Bearing Restrictions: No   Has the patient had 2 or more falls or a fall with injury in the past year?No  Prior Activity Level Limited Community (1-2x/wk): Prior to admission patient was active at home and fully independent with ADLs and IADLs.  However, he was not working or driving.   Home Assistive Devices / Equipment Home Assistive Devices/Equipment: None Home Equipment: None  Prior Device Use: Indicate devices/aids used by the patient prior to current illness, exacerbation or injury? None of the above  Prior Functional Level Prior Function Level of Independence: Independent Comments: does not work or drive  Self Care: Did the patient need help bathing, dressing, using the toilet or eating? Independent  Indoor Mobility: Did the patient need  assistance with walking from room to room (with or without device)? Independent  Stairs: Did the patient need assistance with internal or external stairs (with or without device)? Independent  Functional Cognition: Did the patient need help planning regular tasks such as shopping or remembering to take medications? Independent  Current Functional Level Cognition  Arousal/Alertness: Awake/alert Overall Cognitive Status: Within Functional Limits for tasks assessed Orientation Level: Oriented X4 Attention: Focused, Sustained Focused Attention: Appears intact Sustained Attention: Impaired Sustained Attention Impairment: Verbal basic, Functional basic Memory: Impaired Memory Impairment: Other (comment) (decreased working memory) Awareness: Appears intact Behaviors:  Other (comment) (flat affect) Safety/Judgment: Appears intact    Extremity Assessment (includes Sensation/Coordination)  Upper Extremity Assessment: Overall WFL for tasks assessed  Lower Extremity Assessment: LLE deficits/detail LLE Deficits / Details: Patient with strength at least 4+/5.  Decreased coordination. LLE Coordination: decreased gross motor    ADLs  Overall ADL's : Needs assistance/impaired Eating/Feeding: Independent, Sitting Grooming: Oral care Grooming Details (indicate cue type and reason): min A for standing balance; could not step of water pedal and keep water on while also brushing his teeth Upper Body Bathing: Set up, Sitting Lower Body Bathing Details (indicate cue type and reason): Mod A for standing balance Upper Body Dressing : Set up, Sitting Lower Body Dressing Details (indicate cue type and reason): Mod A for standing balance Toilet Transfer: Moderate assistance, Ambulation Toilet Transfer Details (indicate cue type and reason): bed>around and out into hall>back to room Toileting - Clothing Manipulation Details (indicate cue type and reason): Mod A for standing balance    Mobility  Overal bed  mobility: Modified Independent Bed Mobility: Supine to Sit, Sit to Supine Supine to sit: Modified independent (Device/Increase time), HOB elevated Sit to supine: Modified independent (Device/Increase time) General bed mobility comments: Incr time, no assist needed.    Transfers  Overall transfer level: Needs assistance Equipment used: 1 person hand held assist Transfers: Sit to/from Stand Sit to Stand: Min assist General transfer comment: Min A to steady in standing due to ataxia. Stood from EOB x3, transferred from chair post ambulation.    Ambulation / Gait / Stairs / Wheelchair Mobility  Ambulation/Gait Ambulation/Gait assistance: Mod assist Ambulation Distance (Feet): 150 Feet Assistive device: 1 person hand held assist Gait Pattern/deviations: Step-through pattern, Decreased stride length, Ataxic, Drifts right/left General Gait Details: Ataxic like gait most notably in RLE. Slow, guarded gait with Min-Mod A for stability. LOB x2 with head turns. Balance worsens with challenges and when dual tasking.  Gait velocity: decr Gait velocity interpretation: Below normal speed for age/gender    Posture / Balance Balance Overall balance assessment: Needs assistance Sitting-balance support: No upper extremity supported, Feet supported Sitting balance-Leahy Scale: Good Standing balance support: During functional activity, Single extremity supported Standing balance-Leahy Scale: Poor Standing balance comment: When standing and looking at bulletin board to find objects on it--he had a posterior bias (Mod A for balance, self corrected with cues and at times self corrected without cues). In standing and working on catching objects--but did not want to reach or step out of base of support due to knowing his balance is off (so min A for balance within base of support)    Special needs/care consideration BiPAP/CPAP: No CPM: No Continuous Drip IV: No Dialysis: No Life Vest: No Oxygen:  No Special Bed: No Trach Size: No Wound Vac (area): No       Skin: WDL                               Bowel mgmt: Continent, last BM 03/07/17 Bladder mgmt: Continent  Diabetic mgmt: HgbA1c - 5.1      Previous Home Environment Living Arrangements: Other relatives (brother-in law)  Lives With: Family Available Help at Discharge: Family, Available 24 hours/day Type of Home: House Home Layout: One level Home Access: Stairs to enter Entrance Stairs-Rails: Left Entrance Stairs-Number of Steps: 1 Bathroom Shower/Tub: Psychologist, counselling, Sport and exercise psychologist: Standard Home Care Services: No  Discharge Living Setting Plans for Discharge Living Setting: Lives with (  comment) (Brother-in-law Renaldo Harrison) Type of Home at Discharge: House Discharge Home Layout: One level Discharge Home Access: Stairs to enter Entrance Stairs-Rails: Left Entrance Stairs-Number of Steps: 2 steps, landing, door threshold Discharge Bathroom Shower/Tub: Walk-in shower Discharge Bathroom Toilet: Standard Discharge Bathroom Accessibility: Yes How Accessible: Accessible via walker Does the patient have any problems obtaining your medications?: No  Social/Family/Support Systems Patient Roles: Parent, Other (Comment) (Grandparent, [place], Brother-in-law) Contact Information: Brother-in-law Renaldo Harrison [phone] Anticipated Caregiver: Rich Reining 24/7 and nephew Freida Busman evenings Anticipated Caregiver's Contact Information: see above  Ability/Limitations of Caregiver: None Caregiver Availability: 24/7 Discharge Plan Discussed with Primary Caregiver: Yes Is Caregiver In Agreement with Plan?: Yes Does Caregiver/Family have Issues with Lodging/Transportation while Pt is in Rehab?: No  Goals/Additional Needs Patient/Family Goal for Rehab: PT/OT Mod I-Supervision  Expected length of stay: 6-10 days  Cultural Considerations: None Dietary Needs: Heart Healthy  Equipment Needs: TBD Special Service Needs:  None Additional Information: N/A Pt/Family Agrees to Admission and willing to participate: Yes Program Orientation Provided & Reviewed with Pt/Caregiver Including Roles  & Responsibilities: Yes Additional Information Needs: None Information Needs to be Provided By: N/A  Decrease burden of Care through IP rehab admission: No  Possible need for SNF placement upon discharge: No  Patient Condition: This patient's medical and functional status has changed since the consult dated: 03/05/17 at 9:36 AM in which the Rehabilitation Physician determined and documented that the patient's condition is appropriate for intensive rehabilitative care in an inpatient rehabilitation facility. See "History of Present Illness" (above) for medical update. Functional changes are: Min A transfers and Mod A gait. Patient's medical and functional status update has been discussed with the Rehabilitation physician and patient remains appropriate for inpatient rehabilitation. Will admit to inpatient rehab today.  Preadmission Screen Completed By:  Fae Pippin, 03/08/2017 10:12 AM ______________________________________________________________________   Discussed status with Dr. Allena Katz on 03/08/17 at 1010 and received telephone approval for admission today.  Admission Coordinator:  Fae Pippin, time 1010/Date 03/08/17

## 2017-03-08 ENCOUNTER — Encounter (HOSPITAL_COMMUNITY): Payer: Self-pay

## 2017-03-08 ENCOUNTER — Inpatient Hospital Stay (HOSPITAL_COMMUNITY)
Admission: RE | Admit: 2017-03-08 | Discharge: 2017-03-14 | DRG: 057 | Disposition: A | Discharge status: Home / Self Care | Payer: Medicare Other | Source: Intra-hospital | Attending: Physical Medicine & Rehabilitation | Admitting: Physical Medicine & Rehabilitation

## 2017-03-08 DIAGNOSIS — Z880 Allergy status to penicillin: Secondary | ICD-10-CM | POA: Diagnosis not present

## 2017-03-08 DIAGNOSIS — R269 Unspecified abnormalities of gait and mobility: Secondary | ICD-10-CM | POA: Diagnosis not present

## 2017-03-08 DIAGNOSIS — I639 Cerebral infarction, unspecified: Secondary | ICD-10-CM

## 2017-03-08 DIAGNOSIS — I2510 Atherosclerotic heart disease of native coronary artery without angina pectoris: Secondary | ICD-10-CM | POA: Diagnosis present

## 2017-03-08 DIAGNOSIS — M545 Low back pain, unspecified: Secondary | ICD-10-CM

## 2017-03-08 DIAGNOSIS — Z7901 Long term (current) use of anticoagulants: Secondary | ICD-10-CM

## 2017-03-08 DIAGNOSIS — Z7982 Long term (current) use of aspirin: Secondary | ICD-10-CM | POA: Diagnosis not present

## 2017-03-08 DIAGNOSIS — Z7151 Drug abuse counseling and surveillance of drug abuser: Secondary | ICD-10-CM

## 2017-03-08 DIAGNOSIS — I69322 Dysarthria following cerebral infarction: Secondary | ICD-10-CM

## 2017-03-08 DIAGNOSIS — Z951 Presence of aortocoronary bypass graft: Secondary | ICD-10-CM | POA: Diagnosis not present

## 2017-03-08 DIAGNOSIS — I5022 Chronic systolic (congestive) heart failure: Secondary | ICD-10-CM | POA: Diagnosis present

## 2017-03-08 DIAGNOSIS — Z955 Presence of coronary angioplasty implant and graft: Secondary | ICD-10-CM

## 2017-03-08 DIAGNOSIS — I69354 Hemiplegia and hemiparesis following cerebral infarction affecting left non-dominant side: Principal | ICD-10-CM

## 2017-03-08 DIAGNOSIS — I255 Ischemic cardiomyopathy: Secondary | ICD-10-CM | POA: Diagnosis present

## 2017-03-08 DIAGNOSIS — I69392 Facial weakness following cerebral infarction: Secondary | ICD-10-CM

## 2017-03-08 DIAGNOSIS — Z789 Other specified health status: Secondary | ICD-10-CM | POA: Diagnosis not present

## 2017-03-08 DIAGNOSIS — Z9119 Patient's noncompliance with other medical treatment and regimen: Secondary | ICD-10-CM | POA: Diagnosis not present

## 2017-03-08 DIAGNOSIS — Z716 Tobacco abuse counseling: Secondary | ICD-10-CM

## 2017-03-08 DIAGNOSIS — Z91048 Other nonmedicinal substance allergy status: Secondary | ICD-10-CM | POA: Diagnosis not present

## 2017-03-08 DIAGNOSIS — F1210 Cannabis abuse, uncomplicated: Secondary | ICD-10-CM | POA: Diagnosis present

## 2017-03-08 DIAGNOSIS — F17210 Nicotine dependence, cigarettes, uncomplicated: Secondary | ICD-10-CM | POA: Diagnosis present

## 2017-03-08 DIAGNOSIS — E785 Hyperlipidemia, unspecified: Secondary | ICD-10-CM | POA: Diagnosis not present

## 2017-03-08 DIAGNOSIS — I69393 Ataxia following cerebral infarction: Secondary | ICD-10-CM | POA: Diagnosis not present

## 2017-03-08 DIAGNOSIS — Z91030 Bee allergy status: Secondary | ICD-10-CM

## 2017-03-08 DIAGNOSIS — G8929 Other chronic pain: Secondary | ICD-10-CM

## 2017-03-08 DIAGNOSIS — Z91199 Patient's noncompliance with other medical treatment and regimen due to unspecified reason: Secondary | ICD-10-CM

## 2017-03-08 DIAGNOSIS — I252 Old myocardial infarction: Secondary | ICD-10-CM | POA: Diagnosis not present

## 2017-03-08 DIAGNOSIS — Z79899 Other long term (current) drug therapy: Secondary | ICD-10-CM

## 2017-03-08 DIAGNOSIS — Z8249 Family history of ischemic heart disease and other diseases of the circulatory system: Secondary | ICD-10-CM

## 2017-03-08 DIAGNOSIS — I509 Heart failure, unspecified: Secondary | ICD-10-CM | POA: Diagnosis not present

## 2017-03-08 DIAGNOSIS — K219 Gastro-esophageal reflux disease without esophagitis: Secondary | ICD-10-CM | POA: Diagnosis present

## 2017-03-08 DIAGNOSIS — I69398 Other sequelae of cerebral infarction: Secondary | ICD-10-CM | POA: Diagnosis not present

## 2017-03-08 DIAGNOSIS — I63531 Cerebral infarction due to unspecified occlusion or stenosis of right posterior cerebral artery: Secondary | ICD-10-CM

## 2017-03-08 DIAGNOSIS — Z9114 Patient's other noncompliance with medication regimen: Secondary | ICD-10-CM | POA: Diagnosis not present

## 2017-03-08 DIAGNOSIS — Z888 Allergy status to other drugs, medicaments and biological substances status: Secondary | ICD-10-CM | POA: Diagnosis not present

## 2017-03-08 DIAGNOSIS — Z95810 Presence of automatic (implantable) cardiac defibrillator: Secondary | ICD-10-CM

## 2017-03-08 MED ORDER — ONDANSETRON HCL 4 MG PO TABS
4.0000 mg | ORAL_TABLET | Freq: Four times a day (QID) | ORAL | Status: DC | PRN
Start: 2017-03-08 — End: 2017-03-14

## 2017-03-08 MED ORDER — ATORVASTATIN CALCIUM 80 MG PO TABS
80.0000 mg | ORAL_TABLET | Freq: Every day | ORAL | Status: DC
  Administered 2017-03-08 – 2017-03-13 (×6): 80 mg via ORAL
  Filled 2017-03-08 (×6): qty 1

## 2017-03-08 MED ORDER — ASPIRIN 325 MG PO TABS
325.0000 mg | ORAL_TABLET | Freq: Every day | ORAL | Status: DC
  Administered 2017-03-09 – 2017-03-14 (×6): 325 mg via ORAL
  Filled 2017-03-08 (×6): qty 1

## 2017-03-08 MED ORDER — SORBITOL 70 % SOLN: 30.0000 mL | Freq: Every day | Status: DC | PRN

## 2017-03-08 MED ORDER — RIVAROXABAN 20 MG PO TABS
20.0000 mg | ORAL_TABLET | Freq: Every day | ORAL | Status: DC
  Administered 2017-03-10 – 2017-03-14 (×5): 20 mg via ORAL
  Filled 2017-03-08 (×5): qty 1

## 2017-03-08 MED ORDER — ACETAMINOPHEN-CODEINE #3 300-30 MG PO TABS
1.0000 | ORAL_TABLET | Freq: Four times a day (QID) | ORAL | Status: DC | PRN
  Administered 2017-03-08 – 2017-03-11 (×2): 1 via ORAL
  Filled 2017-03-08: qty 2
  Filled 2017-03-08: qty 1

## 2017-03-08 MED ORDER — ONDANSETRON HCL 4 MG/2ML IJ SOLN: 4.0000 mg | Freq: Four times a day (QID) | INTRAMUSCULAR | Status: DC | PRN

## 2017-03-08 NOTE — Progress Notes (Signed)
Pt discharged to inpatient rehab at this time.  Discussed risk factors of stroke with him and importance of quitting smoking.  He verbalizes understanding.  He has all of his belongings with him, including his cell phone and clothing.  Had off was given to [facility].  Pt verbalized no complaints.

## 2017-03-08 NOTE — Progress Notes (Addendum)
Inpatient Rehabilitation  I have insurance authorization and an IP Rehab bed available to offer patient today.  I await acute medical clearance prior to hopeful admission.  Plan to follow up with team when I know.  Please call with questions.   Update: I have received medical approval and will proceed with admitting patient today.  Please call with questions.   Charlane Ferretti., CCC/SLP Admission Coordinator  [facility] Inpatient Rehabilitation  Cell [phone]

## 2017-03-08 NOTE — Care Management Note (Signed)
Case Management Note  Patient Details  Name: Dionicio Narro MRN: [id number] Date of Birth: [birth year]  Subjective/Objective:                    Action/Plan: Pt discharging to CIR today. No further needs per CM.   Expected Discharge Date:                  Expected Discharge Plan:  IP Rehab Facility  In-House Referral:     Discharge planning Services  CM Consult  Post Acute Care Choice:    Choice offered to:     DME Arranged:    DME Agency:     HH Arranged:    HH Agency:     Status of Service:  Completed, signed off  If discussed at [employer] of Stay Meetings, dates discussed:    Additional Comments:  Kermit Balo, RN 03/08/2017, 10:54 AM
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
## 2017-03-08 NOTE — Progress Notes (Signed)
Fae Pippin [facility] Admission Coordinator Signed Physical Medicine and Rehabilitation  PMR Pre-admission Date of Service: 03/07/2017 2:01 PM  Related encounter: ED to Hosp-Admission (Current) from 03/03/2017 in [facility]       [] Hide copied text PMR Admission Coordinator Pre-Admission Assessment  Patient: Logan [name] is an 41 y.o., male MRN: [id number] DOB: [birth year] Height: 5\' 8"  (172.7 cm) Weight: 68.6 kg (151 lb 3.8 oz)                                                                                                                                                  Insurance Information HMO: X    PPO:      PCP:      IPA:      80/20:      OTHER:  PRIMARY: U[employer] Medicare      Policy#: [id number]      Subscriber: Self CM Name: Rebeca Alert       Phone#: [phone]     Fax#: [phone] Pre-Cert#: [id number]      Employer:  Benefits:  Phone #: Verified online     Name: [facility] online Eff. Date: 07/04/16     Deduct: $0      Out of Pocket Max: [phone]      Life Max: N/A CIR: $0 a day, days 1-90      SNF: $0 a day, days 1-20; $167.50 a day, days 21-100 Outpatient: PT/OT/SLP     Co-Pay: $0 Home Health: PT/OT/SLP      Co-Pay: $0 DME: 80%     Co-Pay: 20% Providers: In-network   SECONDARY: Medicaid [place]      Policy#: [id number]      Subscriber: Self CM Name:       Phone#:      Fax#:  Pre-Cert#:       Employer:  Benefits:  Phone #:      Name:  Eff. Date:      Deduct:       Out of Pocket Max:       Life Max:  CIR:       SNF:  Outpatient:      Co-Pay:  Home Health:       Co-Pay:  DME:      Co-Pay:   Medicaid Application Date:       Case Manager:  Disability Application Date:       Case Worker:   Emergency Contact Information        Contact Information    Name Relation Home Work Mobile   Silber,[name] Daughter   [phone]     Current Medical History  Patient Admitting Diagnosis:Right cerebellar CVA   History of Present Illness:  Logan [name] a 41 y.o.malewith pmh of tobacco and marijuanaabuse, TIA, medication noncompliance, CAD s/p MI 3, MVR, ischemic cardiomyopathy and  left ventricular mural thrombus in 2014 maintained on Xarelto, GERD, systolic heart failure presented on 8/31 with left-sided weakness, Facial droop and slurred speech. History taken from chart review patient.Patient lives with brother-in-law all reported to be independent prior to admission.CT head, reviewed, showing right cerebellar CVA. Per report acute right superior cerebellar infarct,? Right occipital infarct. Patient did not receive TPA. Neurology workup initiated.Patient underwent right CEA T1 segment revascularization by VIR. TTE showing EF of 20-25% with global hypokinesis and moderate MR. U tox positive for THC. Patient with persistent associated gait deficits. MRI 03/05/2017 showed confluent acute infarct in the superior right cerebellum and right occipital temporal cortex. Small area of infarction in the central right thalamus and left occipital cortex. Small right occipital petechial hemorrhage. No focal hematoma. Currently maintained on aspirin for CVA prophylaxis and await plan to resume Xarelto. Maintain on a regular diet. Physical therapy evaluation completed. Patient was admitted for comprehensive rehabilitation program 03/08/17.  NIHTotal: 2  Past Medical History      Past Medical History:  Diagnosis Date  . Anginal pain (HCC)   . CAD (coronary artery disease) 01/22/12   a.anterolateral MI with DES to LAD EF 25% b. reinfarction 8/13 with thrombosis of stent c. s/p CABG x 3: LIMA-LAD, SVG-OM, SVG-RCA (07/2012)   . Chronic systolic CHF (congestive heart failure) (HCC)    a. (07/2012) EF 20-25% diff HK with reg wall abnl, HK anterolat wall, akinesis inferosep, mod MR;  b. 01/2014 Echo: EF 10-15%, glob HK, mod MR.  Marland Kitchen GERD (gastroesophageal reflux disease)   . Hyperlipidemia   . Ischemic cardiomyopathy    a.  01/2014 Echo: EF  10-15%, glob HK;  b. 03/2014 S/P Subcutaneous ICD.  . LV (left ventricular) mural thrombus    "just found it in Oct" (07/09/2012)  . Moderate mitral regurgitation   . Myocardial infarction (HCC) 01/2012; 02/2012; 04/2012  . Noncompliance   . Stroke [facility]) 04/2014   TIA in the setting of medical non-compliance with coumadin requiring hospitalization.  . Syncope   . Tobacco abuse     Family History  family history includes Coronary artery disease in his brother and father; Heart attack in his brother and father; Heart disease in his brother, father, and mother; Hypertension in his sister.  Prior Rehab/Hospitalizations:  Has the patient had major surgery during 100 days prior to admission? No, 1 fall without injury just prior to admission   Current Medications   Current Facility-Administered Medications:  .  0.9 %  sodium chloride infusion, , Intravenous, Continuous, Deveshwar, Sanjeev, MD, Last Rate: 75 mL/hr at 03/07/17 2047 .  acetaminophen-codeine (TYLENOL #3) 300-30 MG per tablet 1-2 tablet, 1-2 tablet, Oral, Q6H PRN, Micki Riley, MD, 1 tablet at 03/08/17 [phone] .  aspirin tablet 325 mg, 325 mg, Oral, Daily, Rejeana Brock, MD, 325 mg at 03/08/17 0925 .  atorvastatin (LIPITOR) tablet 80 mg, 80 mg, Oral, q1800, Rejeana Brock, MD, 80 mg at 03/07/17 1746 .  nitroGLYCERIN 25 mcg in sodium chloride 0.9 % 59.71 mL (25 mcg/mL) syringe, , Intra-arterial, PRN, Julieanne Cotton, MD, 25 mcg at 03/03/17 0909  Patients Current Diet: Diet Heart Room service appropriate? Yes; Fluid consistency: Thin  Precautions / Restrictions Precautions Precautions: Fall Restrictions Weight Bearing Restrictions: No   Has the patient had 2 or more falls or a fall with injury in the past year?No  Prior Activity Level Limited Community (1-2x/wk): Prior to admission patient was active at home and fully independent with ADLs and  IADLs.  However, he was not working or driving.    Home Assistive Devices / Equipment Home Assistive Devices/Equipment: None Home Equipment: None  Prior Device Use: Indicate devices/aids used by the patient prior to current illness, exacerbation or injury? None of the above  Prior Functional Level Prior Function Level of Independence: Independent Comments: does not work or drive  Self Care: Did the patient need help bathing, dressing, using the toilet or eating? Independent  Indoor Mobility: Did the patient need assistance with walking from room to room (with or without device)? Independent  Stairs: Did the patient need assistance with internal or external stairs (with or without device)? Independent  Functional Cognition: Did the patient need help planning regular tasks such as shopping or remembering to take medications? Independent  Current Functional Level Cognition  Arousal/Alertness: Awake/alert Overall Cognitive Status: Within Functional Limits for tasks assessed Orientation Level: Oriented X4 Attention: Focused, Sustained Focused Attention: Appears intact Sustained Attention: Impaired Sustained Attention Impairment: Verbal basic, Functional basic Memory: Impaired Memory Impairment: Other (comment) (decreased working memory) Awareness: Appears intact Behaviors: Other (comment) (flat affect) Safety/Judgment: Appears intact    Extremity Assessment (includes Sensation/Coordination)  Upper Extremity Assessment: Overall WFL for tasks assessed  Lower Extremity Assessment: LLE deficits/detail LLE Deficits / Details: Patient with strength at least 4+/5.  Decreased coordination. LLE Coordination: decreased gross motor    ADLs  Overall ADL's : Needs assistance/impaired Eating/Feeding: Independent, Sitting Grooming: Oral care Grooming Details (indicate cue type and reason): min A for standing balance; could not step of water pedal and keep water on while also brushing his teeth Upper Body Bathing: Set up,  Sitting Lower Body Bathing Details (indicate cue type and reason): Mod A for standing balance Upper Body Dressing : Set up, Sitting Lower Body Dressing Details (indicate cue type and reason): Mod A for standing balance Toilet Transfer: Moderate assistance, Ambulation Toilet Transfer Details (indicate cue type and reason): bed>around and out into hall>back to room Toileting - Clothing Manipulation Details (indicate cue type and reason): Mod A for standing balance    Mobility  Overal bed mobility: Modified Independent Bed Mobility: Supine to Sit, Sit to Supine Supine to sit: Modified independent (Device/Increase time), HOB elevated Sit to supine: Modified independent (Device/Increase time) General bed mobility comments: Incr time, no assist needed.    Transfers  Overall transfer level: Needs assistance Equipment used: 1 person hand held assist Transfers: Sit to/from Stand Sit to Stand: Min assist General transfer comment: Min A to steady in standing due to ataxia. Stood from EOB x3, transferred from chair post ambulation.    Ambulation / Gait / Stairs / Wheelchair Mobility  Ambulation/Gait Ambulation/Gait assistance: Mod assist Ambulation Distance (Feet): 150 Feet Assistive device: 1 person hand held assist Gait Pattern/deviations: Step-through pattern, Decreased stride length, Ataxic, Drifts right/left General Gait Details: Ataxic like gait most notably in RLE. Slow, guarded gait with Min-Mod A for stability. LOB x2 with head turns. Balance worsens with challenges and when dual tasking.  Gait velocity: decr Gait velocity interpretation: Below normal speed for age/gender    Posture / Balance Balance Overall balance assessment: Needs assistance Sitting-balance support: No upper extremity supported, Feet supported Sitting balance-Leahy Scale: Good Standing balance support: During functional activity, Single extremity supported Standing balance-Leahy Scale: Poor Standing  balance comment: When standing and looking at bulletin board to find objects on it--he had a posterior bias (Mod A for balance, self corrected with cues and at times self corrected without cues). In standing and working on  catching objects--but did not want to reach or step out of base of support due to knowing his balance is off (so min A for balance within base of support)    Special needs/care consideration BiPAP/CPAP: No CPM: No Continuous Drip IV: No Dialysis: No Life Vest: No Oxygen: No Special Bed: No Trach Size: No Wound Vac (area): No       Skin: WDL                               Bowel mgmt: Continent, last BM 03/07/17 Bladder mgmt: Continent  Diabetic mgmt: HgbA1c- 5.1      Previous Home Environment Living Arrangements: Other relatives (brother-in law)  Lives With: Family Available Help at Discharge: Family, Available 24 hours/day Type of Home: House Home Layout: One level Home Access: Stairs to enter Entrance Stairs-Rails: Left Entrance Stairs-Number of Steps: 1 Bathroom Shower/Tub: Psychologist, counselling, Sport and exercise psychologist: Standard Home Care Services: No  Discharge Living Setting Plans for Discharge Living Setting: Lives with (comment) (Brother-in-law Renaldo Harrison) Type of Home at Discharge: House Discharge Home Layout: One level Discharge Home Access: Stairs to enter Entrance Stairs-Rails: Left Entrance Stairs-Number of Steps: 2 steps, landing, door threshold Discharge Bathroom Shower/Tub: Walk-in shower Discharge Bathroom Toilet: Standard Discharge Bathroom Accessibility: Yes How Accessible: Accessible via walker Does the patient have any problems obtaining your medications?: No  Social/Family/Support Systems Patient Roles: Parent, Other (Comment) (Grandparent, [place], Brother-in-law) Contact Information: Brother-in-law Renaldo Harrison [phone] Anticipated Caregiver: Rich Reining 24/7 and nephew Freida Busman evenings Anticipated Caregiver's Contact  Information: see above  Ability/Limitations of Caregiver: None Caregiver Availability: 24/7 Discharge Plan Discussed with Primary Caregiver: Yes Is Caregiver In Agreement with Plan?: Yes Does Caregiver/Family have Issues with Lodging/Transportation while Pt is in Rehab?: No  Goals/Additional Needs Patient/Family Goal for Rehab: PT/OT Mod I-Supervision  Expected length of stay: 6-10 days  Cultural Considerations: None Dietary Needs: Heart Healthy  Equipment Needs: TBD Special Service Needs: None Additional Information: N/A Pt/Family Agrees to Admission and willing to participate: Yes Program Orientation Provided & Reviewed with Pt/Caregiver Including Roles  & Responsibilities: Yes Additional Information Needs: None Information Needs to be Provided By: N/A  Decrease burden of Care through IP rehab admission: No  Possible need for SNF placement upon discharge: No  Patient Condition: This patient's medical and functional status has changed since the consult dated: 03/05/17 at 9:36 AM in which the Rehabilitation Physician determined and documented that the patient's condition is appropriate for intensive rehabilitative care in an inpatient rehabilitation facility. See "History of Present Illness" (above) for medical update. Functional changes are: Min A transfers and Mod A gait. Patient's medical and functional status update has been discussed with the Rehabilitation physician and patient remains appropriate for inpatient rehabilitation. Will admit to inpatient rehab today.  Preadmission Screen Completed By:  Fae Pippin, 03/08/2017 10:12 AM ______________________________________________________________________   Discussed status with Dr. Allena Katz on 03/08/17 at 1010 and received telephone approval for admission today.  Admission Coordinator:  Fae Pippin, time 1010/Date 03/08/17       Cosigned by: Marcello Fennel, MD at 03/08/2017 11:39 AM  Revision History
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
## 2017-03-09 ENCOUNTER — Inpatient Hospital Stay (HOSPITAL_COMMUNITY): Payer: Medicare Other | Admitting: Physical Therapy

## 2017-03-09 ENCOUNTER — Inpatient Hospital Stay (HOSPITAL_COMMUNITY): Payer: Medicare Other | Admitting: Speech Pathology

## 2017-03-09 ENCOUNTER — Inpatient Hospital Stay (HOSPITAL_COMMUNITY): Payer: Medicare Other | Admitting: Occupational Therapy

## 2017-03-09 DIAGNOSIS — R269 Unspecified abnormalities of gait and mobility: Secondary | ICD-10-CM

## 2017-03-09 DIAGNOSIS — I69398 Other sequelae of cerebral infarction: Secondary | ICD-10-CM

## 2017-03-09 DIAGNOSIS — I639 Cerebral infarction, unspecified: Secondary | ICD-10-CM

## 2017-03-09 LAB — COMPREHENSIVE METABOLIC PANEL
ALBUMIN: 3 g/dL — AB (ref 3.5–5.0)
ALT: 27 U/L (ref 17–63)
ANION GAP: 8 (ref 5–15)
AST: 30 U/L (ref 15–41)
Alkaline Phosphatase: 70 U/L (ref 38–126)
BUN: 8 mg/dL (ref 6–20)
CO2: 25 mmol/L (ref 22–32)
Calcium: 8.9 mg/dL (ref 8.9–10.3)
Chloride: 106 mmol/L (ref 101–111)
Creatinine, Ser: 0.89 mg/dL (ref 0.61–1.24)
GFR calc Af Amer: >60 mL/min (ref >60)
GFR calc non Af Amer: >60 mL/min (ref >60)
GLUCOSE: 81 mg/dL (ref 65–99)
POTASSIUM: 3.6 mmol/L (ref 3.5–5.1)
SODIUM: 139 mmol/L (ref 135–145)
Total Bilirubin: 0.7 mg/dL (ref 0.3–1.2)
Total Protein: 6 g/dL — ABNORMAL LOW (ref 6.5–8.1)

## 2017-03-09 LAB — CBC WITH DIFFERENTIAL/PLATELET
BASOS ABS: 0 10*3/uL (ref 0.0–0.1)
BASOS PCT: 0 %
EOS ABS: 0.1 10*3/uL (ref 0.0–0.7)
Eosinophils Relative: 1 %
HCT: 38.2 % — ABNORMAL LOW (ref 39.0–52.0)
HEMOGLOBIN: 13.2 g/dL (ref 13.0–17.0)
Lymphocytes Relative: 22 %
Lymphs Abs: 1.8 10*3/uL (ref 0.7–4.0)
MCH: 33.7 pg (ref 26.0–34.0)
MCHC: 34.6 g/dL (ref 30.0–36.0)
MCV: 97.4 fL (ref 78.0–100.0)
MONO ABS: 0.9 10*3/uL (ref 0.1–1.0)
MONOS PCT: 11 %
NEUTROS ABS: 5.2 10*3/uL (ref 1.7–7.7)
NEUTROS PCT: 66 %
Platelets: 267 10*3/uL (ref 150–400)
RBC: 3.92 MIL/uL — ABNORMAL LOW (ref 4.22–5.81)
RDW: 12.9 % (ref 11.5–15.5)
WBC: 8 10*3/uL (ref 4.0–10.5)

## 2017-03-09 NOTE — Progress Notes (Signed)
Physical Therapy Session Note  Patient Details  Name: Logan [name] MRN: [id number] Date of Birth: [birth year]  Today's Date: 03/09/2017 PT Individual Time: 3825-0539 PT Individual Time Calculation (min): 28 min   Short Term Goals: No short term goals set  Skilled Therapeutic Interventions/Progress Updates:    no c/o pain.  Session focus on balance assessment and pt education.    Pt ambulates to gym with min guard assist and verbal cues for reciprocal arm swing.  PT administered BERG Balance Assessment (results below) and discussed results and expectations for progress while on rehab.  Pt verbalized understanding, hoping he can d/c Monday or Tuesday.  Gait back to room with close supervision, occasional scissoring with min guard for balance recovery.  Positioned in w/c with call bell in reach and needs met.   Therapy Documentation Precautions:  Precautions Precautions: Fall Precaution Comments: decreased RLE coordination  Restrictions Weight Bearing Restrictions: No  Balance: Balance Balance Assessed: Yes Standardized Balance Assessment Standardized Balance Assessment: Berg Balance Test Berg Balance Test Sit to Stand: Able to stand  independently using hands Standing Unsupported: Able to stand safely 2 minutes Sitting with Back Unsupported but Feet Supported on Floor or Stool: Able to sit safely and securely 2 minutes Stand to Sit: Sits safely with minimal use of hands Transfers: Able to transfer safely, definite need of hands Standing Unsupported with Eyes Closed: Able to stand 10 seconds safely Standing Ubsupported with Feet Together: Able to place feet together independently and stand for 1 minute with supervision From Standing, Reach Forward with Outstretched Arm: Can reach confidently >25 cm (10") From Standing Position, Pick up Object from Floor: Able to pick up shoe safely and easily From Standing Position, Turn to Look Behind Over each Shoulder: Looks behind one side  only/other side shows less weight shift Turn 360 Degrees: Able to turn 360 degrees safely but slowly Standing Unsupported, Alternately Place Feet on Step/Stool: Able to complete 4 steps without aid or supervision Standing Unsupported, One Foot in Front: Needs help to step but can hold 15 seconds Standing on One Leg: Able to lift leg independently and hold equal to or more than 3 seconds Total Score: 43   See Function Navigator for Current Functional Status.   Therapy/Group: Individual Therapy  Michel Santee 03/09/2017, 3:59 PM

## 2017-03-09 NOTE — Progress Notes (Signed)
Patient information reviewed and entered into eRehab system by Jaliel Deavers, RN, CRRN, PPS Coordinator.  Information including medical coding and functional independence measure will be reviewed and updated through discharge.     Per nursing patient was given "Data Collection Information Summary for Patients in Inpatient Rehabilitation Facilities with attached "Privacy Act Statement-Health Care Records" upon admission.  

## 2017-03-09 NOTE — Progress Notes (Signed)
Nutrition Brief Note  Pt is eating well, consumed 75% of his past two meals. Pt reports cooking for himself at home and no chewing or swallowing difficulties. Pt reports losing 25 lbs in past month, however, per weight records patient lost only 14 lbs. Suspected weight loss related to CHF fluid status.  NFPE showed no indication of fat loss or muscle wasting.  No supplements ordered at this time.   Wynetta Emery ASU Dietetic Intern Pager: [phone] 03/09/2017 4:01 PM
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
## 2017-03-09 NOTE — Progress Notes (Signed)
Subjective/Complaints:   Objective: Vital Signs: Blood pressure (!) 109/53, pulse 70, temperature 98.4 F (36.9 C), temperature source Oral, resp. rate 16, height  (1.727 m), weight 68.6 kg (151 lb 3.8 oz), SpO2 96 %. Mr Brain Wo Contrast  Result Date: 03/07/2017 CLINICAL DATA:  Subacute neuro deficits. Unsteadiness and hemianopsia. EXAM: MRI HEAD WITHOUT CONTRAST TECHNIQUE: Multiplanar, multiecho pulse sequences of the brain and surrounding structures were obtained without intravenous contrast. COMPARISON:  Head CT and CTA 03/03/2017 and 03/06/2017. FINDINGS: Brain: There is confluent restricted diffusion in the upper right cerebellum. Confluent restricted diffusion in the inferomedial right occipital lobe extending into the posteromedial temporal lobe and closely following the right hippocampus. Restricted diffusion in the central right thalamus. Mild petechial hemorrhage in the right occipital cortex. Swelling narrows the upper fourth ventricle. No ventriculomegaly.There are 2 punctate acute infarcts seen in the left occipital cortex. No remote infarct seen. No hydrocephalus or masslike findings. Vascular: Major flow voids are preserved. Skull and upper cervical spine: Negative for marrow lesion Sinuses/Orbits: Negative IMPRESSION: 1. Confluent acute infarct in the superior right cerebellum and right occipital temporal cortex. There is also small areas of infarction in the central right thalamus and left occipital cortex. 2. Right occipital petechial hemorrhage.  No focal hematoma. 3. Local mass effect from cytotoxic edema.  No ventriculomegaly. 4. Restricted diffusion closely follows the right hippocampal formation, possible superimposed seizure phenomenon. Electronically Signed   By: Marnee Spring M.D.   On: 03/07/2017 16:04   Results for orders placed or performed during the hospital encounter of 03/08/17 (from the past 72 hour(s))  CBC WITH DIFFERENTIAL     Status: Abnormal   Collection  Time: 03/09/17  7:08 AM  Result Value Ref Range   WBC 8.0 4.0 - 10.5 K/uL   RBC 3.92 (L) 4.22 - 5.81 MIL/uL   Hemoglobin 13.2 13.0 - 17.0 g/dL   HCT 16.1 (L) 09.6 - 04.5 %   MCV 97.4 78.0 - 100.0 fL   MCH 33.7 26.0 - 34.0 pg   MCHC 34.6 30.0 - 36.0 g/dL   RDW 40.9 81.1 - 91.4 %   Platelets 267 150 - 400 K/uL   Neutrophils Relative % 66 %   Neutro Abs 5.2 1.7 - 7.7 K/uL   Lymphocytes Relative 22 %   Lymphs Abs 1.8 0.7 - 4.0 K/uL   Monocytes Relative 11 %   Monocytes Absolute 0.9 0.1 - 1.0 K/uL   Eosinophils Relative 1 %   Eosinophils Absolute 0.1 0.0 - 0.7 K/uL   Basophils Relative 0 %   Basophils Absolute 0.0 0.0 - 0.1 K/uL     HEENT: normal Cardio: RRR and no murmur Resp: CTA B/L and unlabored GI: BS positive and NT, ND Extremity:  Pulses positive and No Edema Skin:   Intact Neuro: Alert/Oriented, Normal Sensory and Normal Motor Musc/Skel:  Normal Gen NAD   Assessment/Plan: 1. 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 Physiatrist is providing close team supervision and 24 hour management of active medical problems listed below. Physiatrist and rehab team continue to assess barriers to discharge/monitor patient progress toward functional and medical goals. FIM: Function - Bathing Bathing activity did not occur: Refused     Function - Toileting Toileting steps completed by patient: Adjust clothing prior to toileting, Performs perineal hygiene, Adjust clothing after toileting Toileting Assistive Devices: Grab bar or rail Assist level: More than reasonable time  Function - Archivist transfer assistive device: Grab bar Assist level to toilet: No Help, no cues, assistive device, takes more than a reasonable amount of time Assist level from toilet: No Help, no cues, assistive device, takes more than a reasonable amount of time  Function -  Chair/bed transfer Chair/bed transfer method: Stand pivot Chair/bed transfer assist level: No Help, no cues, assistive device, takes more than a reasonable amount of time Chair/bed transfer assistive device: Armrests     Function - Comprehension Comprehension: Auditory Comprehension assist level: Follows basic conversation/direction with no assist  Function - Expression Expression: Verbal Expression assist level: Expresses basic 90% of the time/requires cueing < 10% of the time.  Function - Social Interaction Social Interaction assist level: Interacts appropriately with others - No medications needed.  Function - Problem Solving Problem solving assist level: Solves basic problems with no assist  Function - Memory Patient normally able to recall (first 3 days only): Current season, Location of own room, Staff names and faces, That he or she is in a hospital   Medical Problem List and Plan: 1.  Left-sided weakness, facial droop and dysarthria secondary to right superior cerebellar infarct likely secondary to cardiomyopathy.S/P revascularization of occluded right PCA 2.  DVT Prophylaxis/Anticoagulation: resume xarelto 20 mg daily seven days from admission 3. Pain Management: Tylenol No. 3 4. Mood: Provide emotional support 5. Neuropsych: This patient is capable of making decisions on his own behalf. 6. Skin/Wound Care: Routine skin checks 7. Fluids/Electrolytes/Nutrition: Routine I&O with follow-up chemistries 8. History of ischemic cardiomyopathy left ventricular mural thrombus in 2014 9. History of tobacco and marijuana use. Urine drug screen positive for marijuana. Provide counseling 10. Systolic congestive heart failure. Monitor for any signs of fluid overload 11. Medical noncompliance. Counseling 12. Hyperlipidemia. Lipitor LOS (Days) 1 A FACE TO FACE EVALUATION WAS PERFORMED  KIRSTEINS,ANDREW E 03/09/2017, 8:03 AM
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
## 2017-03-09 NOTE — Progress Notes (Signed)
Responded to [facility] Consult to assist patient with AD.  Patient wants to wait until children are presence to help him.  Patient's Nurse provided AD form to patient and will page Chaplain when patient is ready. Patient was alert sitting having lunch. Chaplain available as needed.  Venida Jarvis, [place], [facility], Pager [phone]
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
## 2017-03-09 NOTE — Care Management Note (Signed)
Inpatient Rehabilitation Center Individual Statement of Services  Patient Name:  Logan [name]  Date:  03/09/2017  Welcome to the Inpatient Rehabilitation Center.  Our goal is to provide you with an individualized program based on your diagnosis and situation, designed to meet your specific needs.  With this comprehensive rehabilitation program, you will be expected to participate in at least 3 hours of rehabilitation therapies Monday-Friday, with modified therapy programming on the weekends.  Your rehabilitation program will include the following services:  Physical Therapy (PT), Occupational Therapy (OT), Speech Therapy (ST), 24 hour per day rehabilitation nursing, Therapeutic Recreaction (TR), Neuropsychology, Case Management (Social Worker), Rehabilitation Medicine, Nutrition Services and Pharmacy Services  Weekly team conferences will be held on Wednesday to discuss your progress.  Your Social Worker will talk with you frequently to get your input and to update you on team discussions.  Team conferences with you and your family in attendance may also be held.  Expected length of stay: 5-7 days Overall anticipated outcome: mod/i level  Depending on your progress and recovery, your program may change. Your Social Worker will coordinate services and will keep you informed of any changes. Your Social Worker's name and contact numbers are listed  below.  The following services may also be recommended but are not provided by the Inpatient Rehabilitation Center:    Home Health Rehabiltiation Services  Outpatient Rehabilitation Services    Arrangements will be made to provide these services after discharge if needed.  Arrangements include referral to agencies that provide these services.  Your insurance has been verified to be:  [employer]-Medicare & Medicaid Your primary doctor is:    Pertinent information will be shared with your doctor and your insurance company.  Social Worker:  Dossie Der,  SW [phone] or [phone]  Information discussed with and copy given to patient by: Lucy Chris, 03/09/2017, 11:50 AM

## 2017-03-09 NOTE — IPOC Note (Signed)
Overall Plan of Care [facility]) Patient Details Name: Logan [name] MRN: [id number] DOB: September 22, 1975  Admitting Diagnosis: CVA  Hospital Problems: Active Problems:   Cerebellar infarction [facility])     Functional Problem List: Nursing Pain  PT Balance, Motor, Safety  OT Balance, Endurance, Motor, Safety  SLP    TR         Basic ADL's: OT Grooming, Dressing, Bathing, Toileting     Advanced  ADL's: OT       Transfers: PT Bed Mobility, Bed to Chair, Furniture, Customer service manager, Research scientist (life sciences): PT Stairs, Ambulation     Additional Impairments: OT Fuctional Use of Upper Extremity  SLP Communication expression    TR      Anticipated Outcomes Item Anticipated Outcome  Self Feeding    Swallowing      Basic self-care  Mod I  Toileting  Mod I   Bathroom Transfers Mod I  Bowel/Bladder  will remain continent while in rehab with assistance  Transfers  mod I  Locomotion  mod I household ambulatory with LRAD, supervision in community  Communication  Mod I   Cognition     Pain  pain level will be acceptable to pt goals  Safety/Judgment      Therapy Plan: PT Intensity: Minimum of 1-2 x/day ,45 to 90 minutes PT Frequency: 5 out of 7 days PT Duration Estimated Length of Stay: 5-7 days OT Intensity: Minimum of 1-2 x/day, 45 to 90 minutes OT Frequency: 5 out of 7 days OT Duration/Estimated Length of Stay: 5-7 days SLP Intensity: Minumum of 1-2 x/day, 30 to 90 minutes SLP Frequency: 1 to 3 out of 7 days SLP Duration/Estimated Length of Stay: 7-10 days     Team Interventions: Nursing Interventions Medication Management, Pain Management, Bladder Management, Bowel Management  PT interventions Ambulation/gait training, Community reintegration, DME/adaptive equipment instruction, Neuromuscular re-education, Psychosocial support, Stair training, UE/LE Strength taining/ROM, UE/LE Coordination activities, Therapeutic Activities, Discharge planning, Designer, jewellery, Cognitive remediation/compensation, Functional mobility training, Patient/family education, Splinting/orthotics, Therapeutic Exercise  OT Interventions Warden/ranger, Discharge planning, Community reintegration, Fish farm manager, Functional mobility training, Neuromuscular re-education, Patient/family education, Self Care/advanced ADL retraining, Therapeutic Activities, Therapeutic Exercise, UE/LE Strength taining/ROM, UE/LE Coordination activities  SLP Interventions Cueing hierarchy, Functional tasks, Speech/Language facilitation, Internal/external aids, Patient/family education, Environmental controls, Therapeutic Activities  TR Interventions    SW/CM Interventions Discharge Planning, Psychosocial Support, Patient/Family Education   Barriers to Discharge MD  Medical stability and Medication compliance  Nursing Decreased caregiver support    PT      OT      SLP      SW       Team Discharge Planning: Destination: PT-Home ,OT- Home , SLP-Home Projected Follow-up: PT-Outpatient PT, OT-  None, SLP- (TBD) Projected Equipment Needs: PT-None recommended by PT, OT- To be determined (likely none), SLP-None recommended by SLP Equipment Details: PT- , OT-  Patient/family involved in discharge planning: PT- Patient,  OT-Patient, SLP-Patient  MD ELOS: 7-10d Medical Rehab Prognosis:  Good Assessment:  41 y.o.malewith pmh of tobacco and marijuana abuse, TIA, medication noncompliance, CAD s/p MI 3, MVR, ischemic cardiomyopathy and left ventricular mural thrombus in 2014 maintained on Xarelto, GERD, systolic heart failure presented on 8/31 with left-sided weakness, Facial droop and slurred speech. History taken from chart review patient. Patient lives with brother-in-law all reported to be independent prior to admission. CT head, reviewed, showing right cerebellar CVA. Per report acute right superior cerebellar infarct,? Right occipital infarct.  Patient did  not receive TPA. Neurology workup initiated.Patient underwent right  P1 segment revascularization by solitaire retriever device  By IR. TTE showing EF of 20-25% with global hypokinesis and moderate MR. U tox positive for THC. Patient with persistent associated gait deficits. MRI 03/05/2017 showed confluent acute infarct in the superior right cerebellum and right occipital temporal cortex. Small area of infarction in the central right thalamus and left occipital cortex. Small right occipital petechial hemorrhage. No focal hematoma. Currently maintained on aspirin for CVA prophylaxis and plan to resume Xarelto 20 mg daily seven days from admission and stop aspirin   Now requiring 24/7 Rehab RN,MD, as well as CIR level PT, OT and SLP.  Treatment team will focus on ADLs and mobility with goals set at Mod I See Team Conference Notes for weekly updates to the plan of care

## 2017-03-10 ENCOUNTER — Inpatient Hospital Stay (HOSPITAL_COMMUNITY): Payer: Medicare Other | Admitting: *Deleted

## 2017-03-10 ENCOUNTER — Encounter: Payer: Self-pay | Admitting: Cardiology

## 2017-03-10 ENCOUNTER — Inpatient Hospital Stay (HOSPITAL_COMMUNITY): Payer: Medicare Other | Admitting: Occupational Therapy

## 2017-03-10 ENCOUNTER — Inpatient Hospital Stay (HOSPITAL_COMMUNITY): Payer: Medicare Other | Admitting: Speech Pathology

## 2017-03-10 DIAGNOSIS — Z789 Other specified health status: Secondary | ICD-10-CM

## 2017-03-10 NOTE — Progress Notes (Signed)
Speech Language Pathology Daily Session Notes  Patient Details  Name: Logan [name] MRN: [id number] Date of Birth: 07/26/1975  Today's Date: 03/10/2017  Session 1: SLP Individual Time: 2202-5427 SLP Individual Time Calculation (min): 30 min   Session 1: SLP Individual Time: 1035-1100 SLP Individual Time Calculation (min): 25 min  Short Term Goals: Week 1: SLP Short Term Goal 1 (Week 1): STGs=LTGs  Skilled Therapeutic Interventions:  Session 1: Skilled treatment session focused on speech goals. SLP facilitated session by providing intermittent supervision verbal cues for patient to self-monitor and correct errors at the sentence level to achieve 100% intelligibility during a verbal description task.  Patient left upright in bed with all needs within reach. Continue with current plan of care.   Session 2: Skilled treatment session focused on speech goals. SLP facilitated session by providing Min A verbal cues to self-monitor and correct errors and utilize speech intelligibility strategies at the conversation level to achieve ~90% intelligibility. Patient left upright in wheelchair with all needs within reach. Continue with current plan of care.   Function:  Cognition Comprehension Comprehension assist level: Follows basic conversation/direction with no assist  Expression   Expression assist level: Expresses basic 90% of the time/requires cueing < 10% of the time.  Social Interaction Social Interaction assist level: Interacts appropriately with others - No medications needed.  Problem Solving Problem solving assist level: Solves basic problems with no assist  Memory Memory assist level: More than reasonable amount of time    Pain No/Denies Pain   Therapy/Group: Individual Therapy  Devika Dragovich 03/10/2017, 4:47 PM

## 2017-03-10 NOTE — Progress Notes (Signed)
Occupational Therapy Session Note  Patient Details  Name: Logan [name] MRN: [id number] Date of Birth: 01-12-1976  Today's Date: 03/10/2017 OT Individual Time: 1100-1200 OT Individual Time Calculation (min): 60 min   Short Term Goals: Week 1:  OT Short Term Goal 1 (Week 1): STG=LTG 2/2 ELOS  Skilled Therapeutic Interventions/Progress Updates:    OT treatment session focused on standing balance, functional mobility, and coordination within modified bathing/dressing tasks. Pt ambulated to gather clothing with min A. B UE coordination and dynamic balance to reach lower drawers. Pt completed bathing seated in shower chair with set-up A and supervision for standing when washing buttocks. Pt completed dressing with supervision, able to maintain standing balance while using B UEs to advance pants over hips. Pt then ambulated to laundry room holding dirty laundry bag in L hand. Focus on normal arm swing while walking with min A at hips. Pt placed clothes in washing machine, added detergent, and set machine with supervision. Pt then worked on dynamic balance using biodex on dynamic levels 12 and 11. Focus on ankle and hip balance strategies. Pt ambulated back to room with min A and left seated in wc with needs met.   Therapy Documentation Precautions:  Precautions Precautions: Fall Precaution Comments: decreased RLE coordination  Restrictions Weight Bearing Restrictions: No Pain: Pain Assessment Pain Assessment: No/denies pain ADL: ADL ADL Comments: Please see functional navigator  See Function Navigator for Current Functional Status.   Therapy/Group: Individual Therapy  Valma Cava 03/10/2017, 11:20 AM

## 2017-03-10 NOTE — Progress Notes (Signed)
Subjective/Complaints:   Objective: Vital Signs: Blood pressure (!) 98/48, pulse 60, temperature 98.4 F (36.9 C), temperature source Oral, resp. rate 18, height '5\' 8"'$  (1.727 m), weight 68.6 kg (151 lb 3.8 oz), SpO2 99 %. No results found. Results for orders placed or performed during the hospital encounter of 03/08/17 (from the past 72 hour(s))  CBC WITH DIFFERENTIAL     Status: Abnormal   Collection Time: 03/09/17  7:08 AM  Result Value Ref Range   WBC 8.0 4.0 - 10.5 K/uL   RBC 3.92 (L) 4.22 - 5.81 MIL/uL   Hemoglobin 13.2 13.0 - 17.0 g/dL   HCT 38.2 (L) 39.0 - 52.0 %   MCV 97.4 78.0 - 100.0 fL   MCH 33.7 26.0 - 34.0 pg   MCHC 34.6 30.0 - 36.0 g/dL   RDW 12.9 11.5 - 15.5 %   Platelets 267 150 - 400 K/uL   Neutrophils Relative % 66 %   Neutro Abs 5.2 1.7 - 7.7 K/uL   Lymphocytes Relative 22 %   Lymphs Abs 1.8 0.7 - 4.0 K/uL   Monocytes Relative 11 %   Monocytes Absolute 0.9 0.1 - 1.0 K/uL   Eosinophils Relative 1 %   Eosinophils Absolute 0.1 0.0 - 0.7 K/uL   Basophils Relative 0 %   Basophils Absolute 0.0 0.0 - 0.1 K/uL  Comprehensive metabolic panel     Status: Abnormal   Collection Time: 03/09/17  7:08 AM  Result Value Ref Range   Sodium 139 135 - 145 mmol/L   Potassium 3.6 3.5 - 5.1 mmol/L   Chloride 106 101 - 111 mmol/L   CO2 25 22 - 32 mmol/L   Glucose, Bld 81 65 - 99 mg/dL   BUN 8 6 - 20 mg/dL   Creatinine, Ser 0.89 0.61 - 1.24 mg/dL   Calcium 8.9 8.9 - 10.3 mg/dL   Total Protein 6.0 (L) 6.5 - 8.1 g/dL   Albumin 3.0 (L) 3.5 - 5.0 g/dL   AST 30 15 - 41 U/L   ALT 27 17 - 63 U/L   Alkaline Phosphatase 70 38 - 126 U/L   Total Bilirubin 0.7 0.3 - 1.2 mg/dL   GFR calc non Af Amer >60 >60 mL/min   GFR calc Af Amer >60 >60 mL/min    Comment: (NOTE) The eGFR has been calculated using the CKD EPI equation. This calculation has not been validated in all clinical situations. eGFR's persistently <60 mL/min signify possible Chronic Kidney Disease.    Anion gap 8 5 -  15     HEENT: normal Cardio: RRR and no murmur Resp: CTA B/L and unlabored GI: BS positive and NT, ND Extremity:  Pulses positive and No Edema Skin:   Intact Neuro: Alert/Oriented, Normal Sensory and Normal Motor Musc/Skel:  Normal Gen NAD   Assessment/Plan: 1. 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 Physiatrist is providing close team supervision and 24 hour management of active medical problems listed below. Physiatrist and rehab team continue to assess barriers to discharge/monitor patient progress toward functional and medical goals. FIM: Function - Bathing Bathing activity did not occur: Refused Position: Shower Body parts bathed by patient: Right arm, Left arm, Chest, Abdomen, Front perineal area, Buttocks, Left upper leg, Right upper leg, Right lower leg, Left lower leg Body parts bathed by helper: Back Assist Level: Touching or steadying assistance(Pt > 75%)  Function- Upper Body Dressing/Undressing What is  the patient wearing?: Pull over shirt/dress Pull over shirt/dress - Perfomed by patient: Thread/unthread right sleeve, Thread/unthread left sleeve, Put head through opening, Pull shirt over trunk Assist Level: Set up Set up : To obtain clothing/put away Function - Lower Body Dressing/Undressing What is the patient wearing?: Pants, Underwear, Socks, Shoes Position: Wheelchair/chair at sink Underwear - Performed by patient: Thread/unthread right underwear leg, Thread/unthread left underwear leg, Pull underwear up/down Pants- Performed by patient: Thread/unthread right pants leg, Thread/unthread left pants leg, Pull pants up/down Socks - Performed by patient: Don/doff right sock, Don/doff left sock Shoes - Performed by patient: Don/doff right shoe, Don/doff left shoe, Fasten right, Fasten left Assist for footwear: Supervision/touching assist Assist for  lower body dressing: Touching or steadying assistance (Pt > 75%)  Function - Toileting Toileting steps completed by patient: Adjust clothing prior to toileting, Performs perineal hygiene, Adjust clothing after toileting Toileting Assistive Devices: Grab bar or rail Assist level: Supervision or verbal cues  Function Midwife transfer assistive device: Grab bar Assist level to toilet: Supervision or verbal cues Assist level from toilet: Touching or steadying assistance (Pt > 75%)  Function - Chair/bed transfer Chair/bed transfer method: Ambulatory Chair/bed transfer assist level: Touching or steadying assistance (Pt > 75%) Chair/bed transfer assistive device: Armrests  Function - Locomotion: Wheelchair Will patient use wheelchair at discharge?: No Function - Locomotion: Ambulation Assistive device: No device Max distance: 300 Assist level: Touching or steadying assistance (Pt > 75%) Assist level: Touching or steadying assistance (Pt > 75%) Assist level: Touching or steadying assistance (Pt > 75%) Assist level: Touching or steadying assistance (Pt > 75%) Assist level: Touching or steadying assistance (Pt > 75%)  Function - Comprehension Comprehension: Auditory Comprehension assist level: Follows basic conversation/direction with no assist  Function - Expression Expression: Verbal Expression assist level: Expresses basic 90% of the time/requires cueing < 10% of the time.  Function - Social Interaction Social Interaction assist level: Interacts appropriately with others - No medications needed.  Function - Problem Solving Problem solving assist level: Solves basic problems with no assist  Function - Memory Memory assist level: More than reasonable amount of time Patient normally able to recall (first 3 days only): Current season, Location of own room, Staff names and faces, That he or she is in a hospital   Medical Problem List and Plan: 1.  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  DVT Prophylaxis/Anticoagulation: resume xarelto 20 mg daily today-  3. Pain Management: Tylenol No. 3 4. Mood: Provide emotional support 5. Neuropsych: This patient is capable of making decisions on his own behalf. 6. Skin/Wound Care: Routine skin checks 7. Fluids/Electrolytes/Nutrition: Routine I&O with follow-up chemistries 8. History of ischemic cardiomyopathy left ventricular mural thrombus in 2014 9. History of tobacco and marijuana use. Urine drug screen positive for marijuana. Provide counseling 10. Systolic congestive heart failure. Monitor for any signs of fluid overload EF 20-25% 11. Medical noncompliance. Counseling 12. Hyperlipidemia. Lipitor LOS (Days) 2 A FACE TO FACE EVALUATION WAS PERFORMED  Logan [name] E 03/10/2017, 7:26 AM

## 2017-03-10 NOTE — Discharge Instructions (Addendum)
Inpatient Rehab Discharge Instructions  Logan [name] Discharge date and time: No discharge date for patient encounter.   Activities/Precautions/ Functional Status: Activity: activity as tolerated Diet: regular diet Wound Care: none needed Functional status:  ___ No restrictions     ___ Walk up steps independently ___ 24/7 supervision/assistance   ___ Walk up steps with assistance ___ Intermittent supervision/assistance  ___ Bathe/dress independently ___ Walk with walker     _x__ Bathe/dress with assistance ___ Walk Independently    ___ Shower independently ___ Walk with assistance    ___ Shower with assistance ___ No alcohol     ___ Return to work/school ________  Special Instructions: No driving, alcohol, tobacco or illicit drug use    COMMUNITY REFERRALS UPON DISCHARGE:    Outpatient: PT, OT,SP  Agency:[facility] Phone:[phone]              APPOINTMENT DATE/TIME: WILL CALL YOU AT HOME TO SET UP APPOINTMENTS  Medical Equipment/Items Ordered:CANE & 3IN1  Agency/Supplier:[facility]   [phone]   GENERAL COMMUNITY RESOURCES FOR PATIENT/FAMILY: Support Groups:[facility] SUPPORT GROUP EVERY SECOND Thursday @ 3:00-4:00PM ON THE REHAB UNIT QUESTIONS CONTACT Logan [name] [phone]  STROKE/TIA DISCHARGE INSTRUCTIONS SMOKING Cigarette smoking nearly doubles your risk of having a stroke & is the single most alterable risk factor  If you smoke or have smoked in the last 12 months, you are advised to quit smoking for your health.  Most of the excess cardiovascular risk related to smoking disappears within a year of stopping.  Ask you doctor about anti-smoking medications  [place] Quit Line: 1-800-QUIT NOW  Free Smoking Cessation Classes [phone]  CHOLESTEROL Know your levels; limit fat & cholesterol in your diet  Lipid Panel     Component Value Date/Time   CHOL 174 03/04/2017 0430   TRIG 110 03/04/2017 0430   HDL 39 (L) 03/04/2017 0430   CHOLHDL 4.5  03/04/2017 0430   VLDL 22 03/04/2017 0430   LDLCALC 113 (H) 03/04/2017 0430      Many patients benefit from treatment even if their cholesterol is at goal.  Goal: Total Cholesterol (CHOL) less than 160  Goal:  Triglycerides (TRIG) less than 150  Goal:  HDL greater than 40  Goal:  LDL (LDLCALC) less than 100   BLOOD PRESSURE American Stroke Association blood pressure target is less that 120/80 mm/Hg  Your discharge blood pressure is:  BP: (!) 99/54  Monitor your blood pressure  Limit your salt and alcohol intake  Many individuals will require more than one medication for high blood pressure  DIABETES (A1c is a blood sugar average for last 3 months) Goal HGBA1c is under 7% (HBGA1c is blood sugar average for last 3 months)  Diabetes: No known diagnosis of diabetes    Lab Results  Component Value Date   HGBA1C 5.1 03/04/2017     Your HGBA1c can be lowered with medications, healthy diet, and exercise.  Check your blood sugar as directed by your physician  Call your physician if you experience unexplained or low blood sugars.  PHYSICAL ACTIVITY/REHABILITATION Goal is 30 minutes at least 4 days per week  Activity: Increase activity slowly, Therapies: Physical Therapy: Home Health Return to work:   Activity decreases your risk of heart attack and stroke and makes your heart stronger.  It helps control your weight and blood pressure; helps you relax and can improve your mood.  Participate in a regular exercise program.  Talk with your doctor about the best form of  exercise for you (dancing, walking, swimming, cycling).  DIET/WEIGHT Goal is to maintain a healthy weight  Your discharge diet is: Diet Heart Room service appropriate? Yes; Fluid consistency: Thin  liquids Your height is:  Height: 5\' 8"  (172.7 cm) Your current weight is: Weight: 68.6 kg (151 lb 3.8 oz) Your Body Mass Index (BMI) is:  BMI (Calculated): 23  Following the type of diet specifically designed for you  will help prevent another stroke.  Your goal weight range is:    Your goal Body Mass Index (BMI) is 19-24.  Healthy food habits can help reduce 3 risk factors for stroke:  High cholesterol, hypertension, and excess weight.  RESOURCES Stroke/Support Group:  Call [phone]   STROKE EDUCATION PROVIDED/REVIEWED AND GIVEN TO PATIENT Stroke warning signs and symptoms How to activate emergency medical system (call 911). Medications prescribed at discharge. Need for follow-up after discharge. Personal risk factors for stroke. Pneumonia vaccine given:  Flu vaccine given:  My questions have been answered, the writing is legible, and I understand these instructions.  I will adhere to these goals & educational materials that have been provided to me after my discharge from the hospital.      My questions have been answered and I understand these instructions. I will adhere to these goals and the provided educational materials after my discharge from the hospital.  Patient/Caregiver Signature _______________________________ Date __________  Clinician Signature _______________________________________ Date __________  Please bring this form and your medication list with you to all your follow-up doctor's appointments. Information on my medicine - XARELTO (rivaroxaban)  This medication education was reviewed with me or my healthcare representative as part of my discharge preparation.  The pharmacist that spoke with me during my hospital stay was:  Clarnce Flock, [facility]  WHY WAS Logan [name] PRESCRIBED FOR YOU? Xarelto was prescribed to treat blood clots that may have been found in the veins of your legs (deep vein thrombosis) or in your lungs (pulmonary embolism) and to reduce the risk of them occurring again.  What do you need to know about Xarelto? The dose is changed to one 20 mg tablet taken ONCE A DAY with your evening meal.  DO NOT stop taking Xarelto without talking to the health  care provider who prescribed the medication.  Refill your prescription for 20 mg tablets before you run out.  After discharge, you should have regular check-up appointments with your healthcare provider that is prescribing your Xarelto.  In the future your dose may need to be changed if your kidney function changes by a significant amount.  What do you do if you miss a dose? If you are taking Xarelto TWICE DAILY and you miss a dose, take it as soon as you remember. You may take two 15 mg tablets (total 30 mg) at the same time then resume your regularly scheduled 15 mg twice daily the next day.  If you are taking Xarelto ONCE DAILY and you miss a dose, take it as soon as you remember on the same day then continue your regularly scheduled once daily regimen the next day. Do not take two doses of Xarelto at the same time.   Important Safety Information Xarelto is a blood thinner medicine that can cause bleeding. You should call your healthcare provider right away if you experience any of the following: ? Bleeding from an injury or your nose that does not stop. ? Unusual colored urine (red or dark brown) or unusual colored stools (red or black). ?  Unusual bruising for unknown reasons. ? A serious fall or if you hit your head (even if there is no bleeding).  Some medicines may interact with Xarelto and might increase your risk of bleeding while on Xarelto. To help avoid this, consult your healthcare provider or pharmacist prior to using any new prescription or non-prescription medications, including herbals, vitamins, non-steroidal anti-inflammatory drugs (NSAIDs) and supplements.  This website has more information on Xarelto: [email or web address].br.

## 2017-03-10 NOTE — Progress Notes (Signed)
Physical Therapy Session Note  Patient Details  Name: Logan [name] MRN: [id number] Date of Birth: 1975-10-13  Today's Date: 03/10/2017 PT Individual Time: 1545-1700 PT Individual Time Calculation (min): 75 min   Short Term Goals: Week 1:   STG=LTG  Skilled Therapeutic Interventions/Progress Updates:  Tx focused on functional mobility training, DGI assessment, and NMR via forced use, manual facilitation, and multi-modal cues for coordination training during functional tasks. Pt in bed, ready to go. Eager to DC ASAP. Discussed benefiots of appriopriate LOS Sit<>stand qwtih S/min A to steady per pt request.  Gait in controlled settings 2x300' with CGA/Min A with distractions. Increased balance challenge with kicking yoga block and tossing ball to self. Pt had most difficulty with SLS tasks, needing Min A to recover.  Nustep x50min with LEs only for coordiantion challenge at 5>>7 levels and taerget 60-70 spm AdministeredDGI - see results below, indicating increased fall risk. Pt educated on functional implications of findings. PT requesting use trial of SPC. Pt performed gait with SPC x300' with close S and cues for technique and arm swing, heel strike.  Stair training x12 with 1 rail and reciprocal pattern with close S and safety cues.  Pt educated in healthy lifestyle choices to reduce risk od recurrent CVA, especially diet. Pt with no further questions at this time.       Therapy Documentation Precautions:  Precautions Precautions: Fall Precaution Comments: decreased RLE coordination  Restrictions Weight Bearing Restrictions: No General:   Vital Signs: Therapy Vitals Temp: 98.9 F (37.2 C) Temp Source: Oral Pulse Rate: 76 Resp: 17 BP: 129/75 Patient Position (if appropriate): Sitting (After exercise) Oxygen Therapy SpO2: 100 % O2 Device: Not Delivered Pain: none       Balance: Standardized Balance Assessment Standardized Balance Assessment: Dynamic Gait Index Dynamic  Gait Index Level Surface: Moderate Impairment Change in Gait Speed: Mild Impairment Gait with Horizontal Head Turns: Moderate Impairment Gait with Vertical Head Turns: Mild Impairment Gait and Pivot Turn: Moderate Impairment Step Over Obstacle: Mild Impairment Step Around Obstacles: Mild Impairment Steps: Mild Impairment Total Score: 13   See Function Navigator for Current Functional Status.   Therapy/Group: Individual Therapy  Sharis Keeran Virl Cagey, PT, DPT  03/10/2017, 5:26 PM

## 2017-03-11 ENCOUNTER — Inpatient Hospital Stay (HOSPITAL_COMMUNITY): Payer: Medicare Other | Admitting: Occupational Therapy

## 2017-03-11 ENCOUNTER — Inpatient Hospital Stay (HOSPITAL_COMMUNITY): Payer: Medicare Other | Admitting: Physical Therapy

## 2017-03-11 ENCOUNTER — Inpatient Hospital Stay (HOSPITAL_COMMUNITY): Payer: Medicare Other | Admitting: Speech Pathology

## 2017-03-11 DIAGNOSIS — E785 Hyperlipidemia, unspecified: Secondary | ICD-10-CM

## 2017-03-11 NOTE — Progress Notes (Signed)
Speech Language Pathology Daily Session Note  Patient Details  Name: Logan [name] MRN: [id number] Date of Birth: February 15, 1976  Today's Date: 03/11/2017 SLP Individual Time: 1000-1030 SLP Individual Time Calculation (min): 30 min  Short Term Goals: Week 1: SLP Short Term Goal 1 (Week 1): STGs=LTGs  Skilled Therapeutic Interventions: Skilled treatment session focused on speech goals. SLP facilitated session by providing supervision verbal cues for utilization of speech intelligibility strategies in order for patient to achieve ~90% intelligibility at the sentence level while reading aloud. Patient left upright in wheelchair with all needs within reach and quick release belt in place. Continue with current plan of care.      Function:  Cognition Comprehension Comprehension assist level: Follows basic conversation/direction with extra time/assistive device  Expression   Expression assist level: Expresses basic 90% of the time/requires cueing < 10% of the time.  Social Interaction Social Interaction assist level: Interacts appropriately with others - No medications needed.  Problem Solving Problem solving assist level: Solves basic problems with no assist  Memory Memory assist level: More than reasonable amount of time    Pain No/Denies Pain   Therapy/Group: Individual Therapy  Coral Timme 03/11/2017, 3:28 PM

## 2017-03-11 NOTE — Progress Notes (Signed)
Occupational Therapy Session Note  Patient Details  Name: Mekiah Cambridge MRN: [id number] Date of Birth: 1975/07/21  Today's Date: 03/11/2017 OT Individual Time: 1300-1400 OT Individual Time Calculation (min): 60 min    Short Term Goals: Week 1:  OT Short Term Goal 1 (Week 1): STG=LTG 2/2 ELOS  Skilled Therapeutic Interventions/Progress Updates:    OT treatment session focused on modified bathing/dressing, dynamic balance, functional ambulation and fine motor coordination. Pt collected clothing ambulating in room with min guard/close supervision. Pt completed bathing with set-up and supervision with improved standing balance when reaching to wash buttocks. Dressing completed sit<>stand with supervision. Pt ambulated to therapy gym with 75% supervision and intermittent min guard A to correct lateral LOB. Dynamic balance with stepping out of base of support to collect horse shoes, then cross midline to place overhead. Pt given home fine motor program and thera-putty exercise worksheet. Worked on fine motor coordination using deck of cards, firm blue thera-putty, and handwriting. Pt returned to room at end of session in similar fashion as above. Pt left seated in wc with needs met and safety belt on.   Therapy Documentation Precautions:  Precautions Precautions: Fall Precaution Comments: decreased RLE coordination  Restrictions Weight Bearing Restrictions: No Pain:   none/denies pain ADL: ADL ADL Comments: Please see functional navigator  See Function Navigator for Current Functional Status.   Therapy/Group: Individual Therapy  Valma Cava 03/11/2017, 1:52 PM

## 2017-03-11 NOTE — Progress Notes (Signed)
Logan [name] is a 41 y.o. male 1975/09/14 [id number]  Subjective: No new complaints. No new problems. Slept well. Feeling OK.  Objective: Vital signs in last 24 hours: Temp:  [97.8 F (36.6 C)-98.9 F (37.2 C)] 97.8 F (36.6 C) (09/08 0623) Pulse Rate:  [62-76] 62 (09/08 0623) Resp:  [17-18] 18 (09/08 0623) BP: (92-129)/(45-75) 115/65 (09/08 0623) SpO2:  [99 %-100 %] 100 % (09/08 5015) Weight change:  Last BM Date: 03/10/17  Intake/Output from previous day: 09/07 0701 - 09/08 0700 In: 840 [P.O.:840] Out: -  Last cbgs: CBG (last 3)  No results for input(s): GLUCAP in the last 72 hours.   Physical Exam General: No apparent distress   HEENT: not dry Lungs: Normal effort. Lungs clear to auscultation, no crackles or wheezes. Cardiovascular: Regular rate and rhythm, no edema Abdomen: S/NT/ND; BS(+) Musculoskeletal:  unchanged Neurological: No new neurological deficits Wounds: N/A    Skin: clear  Mental state: Alert, oriented, cooperative    Lab Results: BMET    Component Value Date/Time   NA 139 03/09/2017 0708   K 3.6 03/09/2017 0708   CL 106 03/09/2017 0708   CO2 25 03/09/2017 0708   GLUCOSE 81 03/09/2017 0708   BUN 8 03/09/2017 0708   CREATININE 0.89 03/09/2017 0708   CALCIUM 8.9 03/09/2017 0708   GFRNONAA >60 03/09/2017 0708   GFRAA >60 03/09/2017 0708   CBC    Component Value Date/Time   WBC 8.0 03/09/2017 0708   RBC 3.92 (L) 03/09/2017 0708   HGB 13.2 03/09/2017 0708   HCT 38.2 (L) 03/09/2017 0708   PLT 267 03/09/2017 0708   MCV 97.4 03/09/2017 0708   MCH 33.7 03/09/2017 0708   MCHC 34.6 03/09/2017 0708   RDW 12.9 03/09/2017 0708   LYMPHSABS 1.8 03/09/2017 0708   MONOABS 0.9 03/09/2017 0708   EOSABS 0.1 03/09/2017 0708   BASOSABS 0.0 03/09/2017 0708    Studies/Results: No results found.  Medications: I have reviewed the patient's current medications.  Assessment/Plan:   1. 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 Milinda Hirschfeld, ASA. CIR 2. DVT proph - Xarelto 3. CHF 4.Dyslipidemia - Lipitor 4. Tobacco/pot use   Length of stay, days: 3  Sonda Primes , MD 03/11/2017, 12:47 PM

## 2017-03-11 NOTE — Progress Notes (Signed)
Physical Therapy Session Note  Patient Details  Name: Logan [name] MRN: [id number] Date of Birth: 08/22/1975  Today's Date: 03/11/2017 PT Individual Time: 0905-0950 PT Individual Time Calculation (min): 45 min     Skilled Therapeutic Interventions/Progress Updates:    Session 1:  0802-2336:  Session initiated with pt lying in bed.  Denies pain.  Vitals:  106/55;  Sp02: 99%; HR: 77 bpm; Pt requests to utilize restroom and ambulates into restroom with min assist.  Able to perform toilet transfer with supervision.  Ambulates to sink to wash hands with min A.  Remainder of session focused on improving static balance, dual tasking, and equalizing step length b/l during gait without an assistive device to decrease risk for falls.  Pt ambulated while performing changes in gait speed, with head turns (horizontal and vertical), and performing turns.  During turns pt demonstrates enbloc turning.  Balance activities included standing on foam with FT, EC to improve use of vestibular cues for balance, single limb balance on B LE x 1 min b/l, and step stepping at stairs.  Dual tasking included walking in kitchen while looking to find pots/pans/strainer.  Pt vitals remained stable during intermittent checks t/o session.  Following session, pt left up in wheelchair with quick release belt donned with call bell in place as well as phone and nursing aware of location.  Session 2:  1224-4975 :  Pt agreeable to having afternoon session early.  Pt denies pain.  Session again focused on improving balance and gait pattern as well as endurance.  Pt requested to perform nu-step which he did for 15 min with B LE.   Pt ambulated working on turning head, stepping up and down curb steps, over uneven surfaces, and stepping over objects.  Also worked on standing and improving stability during transition gait after stand.  Pt ambulated 25 ft between chairs and when reaching second chair, performed cut move to change directions to  ambulate to another chair.  Pt also performed single limb balance.  Following session, pt returned to bed with 2 rails up, needs met, and bed alarm donned.  HR: 74 and Sp02: 100% when checked during session.  Therapy Documentation Precautions:  Precautions Precautions: Fall Precaution Comments: decreased RLE coordination  Restrictions Weight Bearing Restrictions: No   See Function Navigator for Current Functional Status.   Therapy/Group: Individual Therapy  Sundiata Ferrick Hilario Quarry 03/11/2017, 11:12 AM

## 2017-03-12 DIAGNOSIS — I509 Heart failure, unspecified: Secondary | ICD-10-CM

## 2017-03-12 NOTE — Progress Notes (Signed)
Patient ID: Logan [name], male   DOB: 03/27/1976, 41 y.o.   MRN: [id number]  Logan [name] is a 41 y.o. male 01-19-1976 [id number]  Subjective: No new complaints. No new problems. Slept well. Feeling OK.  Objective: Vital signs in last 24 hours: Temp:  [98.2 F (36.8 C)] 98.2 F (36.8 C) (09/09 0557) Pulse Rate:  [58-105] 58 (09/09 0557) Resp:  [18] 18 (09/09 0557) BP: (103-115)/(62-67) 103/67 (09/09 0557) SpO2:  [99 %-100 %] 99 % (09/09 0557) Weight change:  Last BM Date: 03/10/17  Intake/Output from previous day: 09/08 0701 - 09/09 0700 In: 480 [P.O.:480] Out: -  Last cbgs: CBG (last 3)  No results for input(s): GLUCAP in the last 72 hours.   Physical Exam General: No apparent distress   HEENT: not dry Lungs: Normal effort. Lungs clear to auscultation, no crackles or wheezes. Cardiovascular: Regular rate and rhythm, no edema Abdomen: S/NT/ND; BS(+) Musculoskeletal:  unchanged Neurological: No new neurological deficits Wounds: N/A    Skin: clear   Mental state: Alert, oriented, cooperative    Lab Results: BMET    Component Value Date/Time   NA 139 03/09/2017 0708   K 3.6 03/09/2017 0708   CL 106 03/09/2017 0708   CO2 25 03/09/2017 0708   GLUCOSE 81 03/09/2017 0708   BUN 8 03/09/2017 0708   CREATININE 0.89 03/09/2017 0708   CALCIUM 8.9 03/09/2017 0708   GFRNONAA >60 03/09/2017 0708   GFRAA >60 03/09/2017 0708   CBC    Component Value Date/Time   WBC 8.0 03/09/2017 0708   RBC 3.92 (L) 03/09/2017 0708   HGB 13.2 03/09/2017 0708   HCT 38.2 (L) 03/09/2017 0708   PLT 267 03/09/2017 0708   MCV 97.4 03/09/2017 0708   MCH 33.7 03/09/2017 0708   MCHC 34.6 03/09/2017 0708   RDW 12.9 03/09/2017 0708   LYMPHSABS 1.8 03/09/2017 0708   MONOABS 0.9 03/09/2017 0708   EOSABS 0.1 03/09/2017 0708   BASOSABS 0.0 03/09/2017 0708    Studies/Results: No results found.  Medications: I have reviewed the patient's current medications.  Assessment/Plan:  1.   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 On ASA and Xarelto. CIR. 2. DVT proph w/Xarelto 3. CHF 4. Dyslipidemia - on Lipitor 5. Tobacco and THC use     Length of stay, days: 4  Sonda Primes , MD 03/12/2017, 9:17 AM

## 2017-03-13 ENCOUNTER — Inpatient Hospital Stay (HOSPITAL_COMMUNITY): Payer: Medicare Other | Admitting: Occupational Therapy

## 2017-03-13 ENCOUNTER — Inpatient Hospital Stay (HOSPITAL_COMMUNITY): Payer: Medicare Other | Admitting: Speech Pathology

## 2017-03-13 ENCOUNTER — Inpatient Hospital Stay (HOSPITAL_COMMUNITY): Payer: Medicare Other | Admitting: Physical Therapy

## 2017-03-13 DIAGNOSIS — I69393 Ataxia following cerebral infarction: Secondary | ICD-10-CM

## 2017-03-13 NOTE — Progress Notes (Signed)
Speech Language Pathology Daily Session Note  Patient Details  Name: Logan [name] MRN: [id number] Date of Birth: 02-17-1976  Today's Date: 03/13/2017 SLP Individual Time: 0730-0800 SLP Individual Time Calculation (min): 30 min  Short Term Goals: Week 1: SLP Short Term Goal 1 (Week 1): STGs=LTGs  Skilled Therapeutic Interventions: Skilled treatment session focused on speech goals. Patient was Mod I for use of speech intelligibility strategies at the conversation level to achieve 100% intelligibility. Patient left upright in bed with all needs within reach. Continue with current plan of care.      Function:  Eating Eating   Modified Consistency Diet: No Eating Assist Level: No help, No cues           Cognition Comprehension Comprehension assist level: Understands basic 90% of the time/cues < 10% of the time  Expression   Expression assist level: Expresses basic needs/ideas: With extra time/assistive device  Social Interaction Social Interaction assist level: Interacts appropriately 90% of the time - Needs monitoring or encouragement for participation or interaction.  Problem Solving Problem solving assist level: Solves basic 90% of the time/requires cueing < 10% of the time  Memory Memory assist level: Recognizes or recalls 90% of the time/requires cueing < 10% of the time    Pain Pain Assessment Pain Assessment: No/denies pain  Therapy/Group: Individual Therapy  Sible Straley 03/13/2017, 3:43 PM

## 2017-03-13 NOTE — Progress Notes (Addendum)
Occupational Therapy Session Note  Patient Details  Name: Logan [name] MRN: [id number] Date of Birth: 1976-01-19  Today's Date: 03/13/2017 OT Individual Time: 1000-1100 OT Individual Time Calculation (min): 60 min    Short Term Goals: Week 1:  OT Short Term Goal 1 (Week 1): STG=LTG 2/2 ELOS  Skilled Therapeutic Interventions/Progress Updates:    OT treatment session focused on home management, transfer training, B UE coordination, UB strengthening, and balance strategies. Pt ambulated to therapy apartment with quad cane with no LOB that he couldn't correct. Pt was educated on walk-in shower transfer using shower simulation and 3-in-1 BSC used as a shower chair. OT demonstrated transfer, then pt demonstrated understanding. B UE coordination and dynamic balance with bed making iADL task. UB strengthening with 15 mins on [employer] arm bike level 6 and 7. Dynamic balance with step and UE follow through with wii bowling in standing.  Pt ambulated back to room and left seated at EOB with needs met.   Therapy Documentation Precautions:  Precautions Precautions: Fall Precaution Comments: decreased RLE coordination  Restrictions Weight Bearing Restrictions: No Pain: Pain Assessment Pain Assessment: No/denies pain Pain Score: 0-No pain ADL: ADL ADL Comments: Please see functional navigator  See Function Navigator for Current Functional Status.  Therapy/Group: Individual Therapy  Valma Cava 03/13/2017, 10:23 AM

## 2017-03-13 NOTE — Progress Notes (Signed)
Social Work Patient ID: Logan [name], male   DOB: 1975-10-26, 41 y.o.   MRN: [id number]  MD feels pt is ready to for discharge tomorrow and team feels he will reach his goals by then. Discussed equipment and follow up Therapies. Pt can get transportation to OP will make referral.

## 2017-03-13 NOTE — Discharge Summary (Signed)
Discharge summary job # [id number]

## 2017-03-13 NOTE — Progress Notes (Signed)
Physical Therapy Session Note  Patient Details  Name: Logan [name] MRN: [id number] Date of Birth: Nov 03, 1975  Today's Date: 03/13/2017 PT Individual Time: 0900-1000 PT Individual Time Calculation (min): 60 min   Short Term Goals: Week 1:    SGT = LTG due to length of stay   Skilled Therapeutic Interventions/Progress Updates: Pt presented in bed awake and agreeable to therapy. Pt independent in donning shirt and shoes. Ambulated 169f to ortho gym min guard fade to close supervision. Performed functional activities including car transfer, bed mobility, stairs, and floor transfers all performed at supervision or mod I level. Pt ambulated 105fno AD and >30056fith SPC, pt demonstrated min guard with no AD but improved posturing and gait with SPC, pt agreeable to ambulate with SPC upon d/c.  Pt also performed Dynavision on compliant and non-compliant surface with feet together for standing balance challenges and NuStep L4 x 8 min for endurance and conditioning. Pt returned to room at end of session and left sitting at EOB with call bell within reach and needs met.      Therapy Documentation Precautions:  Precautions Precautions: Fall Precaution Comments: decreased RLE coordination  Restrictions Weight Bearing Restrictions: No General:   Vital Signs: Therapy Vitals Temp: 98.9 F (37.2 C) Temp Source: Oral Pulse Rate: 67 Resp: 18 BP: (!) 103/55 Patient Position (if appropriate): Lying Oxygen Therapy SpO2: 98 % O2 Device: Not Delivered Pain: Pain Assessment Pain Assessment: No/denies pain Mobility: Bed Mobility Bed Mobility: Supine to Sit Supine to Sit: 6: Modified independent (Device/Increase time) Transfers Transfers: Yes Sit to Stand: 6: Modified independent (Device/Increase time) Stand to Sit: 6: Modified independent (Device/Increase time) Locomotion : Ambulation Ambulation: Yes Ambulation/Gait Assistance: 5: Supervision Assistive device: Straight cane Stairs /  Additional Locomotion Stairs: Yes Stairs Assistance: 5: Supervision Stair Management Technique: One rail Left Height of Stairs: 6 Curb: 5: Supervision  Trunk/Postural Assessment : Cervical Assessment Cervical Assessment: Within Functional Limits Thoracic Assessment Thoracic Assessment: Within Functional Limits Lumbar Assessment Lumbar Assessment: Within Functional Limits Postural Control Postural Control: Deficits on evaluation Trunk Control: ataxic Protective Responses: decreased  Balance: Balance Balance Assessed: Yes Static Standing Balance Static Standing - Balance Support: During functional activity Static Standing - Level of Assistance: 6: Modified independent (Device/Increase time) Dynamic Standing Balance Dynamic Standing - Balance Support: During functional activity Dynamic Standing - Level of Assistance: 6: Modified independent (Device/Increase time) Exercises:   Other Treatments:     See Function Navigator for Current Functional Status.   Therapy/Group: Individual Therapy  Shanique Aslinger 03/13/2017, 2:09 PM

## 2017-03-13 NOTE — Progress Notes (Signed)
Subjective/Complaints: Wants to go home today, has brother in law at home with him  ROS-  Neg for CP/SOB, N/V/D  Objective: Vital Signs: Blood pressure (!) 99/54, pulse 65, temperature 98.2 F (36.8 C), temperature source Oral, resp. rate 18, height 5\' 8"  (1.727 m), weight 68.6 kg (151 lb 3.8 oz), SpO2 100 %. No results found. No results found for this or any previous visit (from the past 72 hour(s)).   HEENT: normal Cardio: RRR and no murmur Resp: CTA B/L and unlabored GI: BS positive and NT, ND Extremity:  Pulses positive and No Edema Skin:   Intact Neuro: Alert/Oriented, Normal Sensory and Normal Motor Musc/Skel:  Normal Gen NAD   Assessment/Plan: 1. 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 Physiatrist is providing close team supervision and 24 hour management of active medical problems listed below. Physiatrist and rehab team continue to assess barriers to discharge/monitor patient progress toward functional and medical goals. FIM: Function - Bathing Bathing activity did not occur: Refused Position: Shower Body parts bathed by patient: Right arm, Left arm, Chest, Abdomen, Front perineal area, Buttocks, Left upper leg, Right upper leg, Right lower leg, Left lower leg Body parts bathed by helper: Back Assist Level: Touching or steadying assistance(Pt > 75%)  Function- Upper Body Dressing/Undressing What is the patient wearing?: Pull over shirt/dress Pull over shirt/dress - Perfomed by patient: Thread/unthread right sleeve, Thread/unthread left sleeve, Put head through opening, Pull shirt over trunk Assist Level: Set up Set up : To obtain clothing/put away Function - Lower Body Dressing/Undressing What is the patient wearing?: Pants, Underwear, Socks, Shoes Position: Wheelchair/chair at sink Underwear - Performed by patient: Thread/unthread right underwear  leg, Thread/unthread left underwear leg, Pull underwear up/down Pants- Performed by patient: Thread/unthread right pants leg, Thread/unthread left pants leg, Pull pants up/down Socks - Performed by patient: Don/doff right sock, Don/doff left sock Shoes - Performed by patient: Don/doff right shoe, Don/doff left shoe, Fasten right, Fasten left Assist for footwear: Supervision/touching assist Assist for lower body dressing: Touching or steadying assistance (Pt > 75%)  Function - Toileting Toileting steps completed by patient: Adjust clothing prior to toileting, Performs perineal hygiene, Adjust clothing after toileting Toileting Assistive Devices: Grab bar or rail Assist level: Supervision or verbal cues  Function Programmer, multimedia transfer assistive device: Grab bar Assist level to toilet: Supervision or verbal cues Assist level from toilet: Supervision or verbal cues  Function - Chair/bed transfer Chair/bed transfer method: Ambulatory Chair/bed transfer assist level: Touching or steadying assistance (Pt > 75%) Chair/bed transfer assistive device: Armrests  Function - Locomotion: Wheelchair Will patient use wheelchair at discharge?: No Function - Locomotion: Ambulation Assistive device: No device Max distance: 200 ft Assist level: Touching or steadying assistance (Pt > 75%) Assist level: Touching or steadying assistance (Pt > 75%) Assist level: Touching or steadying assistance (Pt > 75%) Assist level: Touching or steadying assistance (Pt > 75%) Assist level: Touching or steadying assistance (Pt > 75%)  Function - Comprehension Comprehension: Auditory Comprehension assist level: Understands basic 90% of the time/cues < 10% of the time  Function - Expression Expression: Verbal Expression assist level: Expresses basic 90% of the time/requires cueing < 10% of the time.  Function - Social Interaction Social Interaction assist level: Interacts appropriately 90% of the time -  Needs monitoring or encouragement for participation or interaction.  Function - Problem Solving Problem solving assist level: Solves basic 90%  of the time/requires cueing < 10% of the time  Function - Memory Memory assist level: Recognizes or recalls 90% of the time/requires cueing < 10% of the time Patient normally able to recall (first 3 days only): Current season, Location of own room, Staff names and faces, That he or she is in a hospital   Medical Problem List and Plan: 1.  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  DVT Prophylaxis/Anticoagulation: Cont xarelto 20 mg daily   3. Pain Management: Tylenol No. 3 4. Mood: Provide emotional support 5. Neuropsych: This patient is capable of making decisions on his own behalf. 6. Skin/Wound Care: Routine skin checks 7. Fluids/Electrolytes/Nutrition: Routine I&O with follow-up chemistries 8. History of ischemic cardiomyopathy left ventricular mural thrombus in 2014, CAD x 3 in 2014 9. History of tobacco and marijuana use. Urine drug screen positive for marijuana. Provide counseling 10. Systolic congestive heart failure. Monitor for any signs of fluid overload EF 20-25% 11. Medical noncompliance. Counseling 12. Hyperlipidemia. Lipitor LOS (Days) 5 A FACE TO FACE EVALUATION WAS PERFORMED  KIRSTEINS,ANDREW E 03/13/2017, 7:46 AM

## 2017-03-13 NOTE — Progress Notes (Signed)
Occupational Therapy Discharge Summary  Patient Details  Name: Logan [name] MRN: [id number] Date of Birth: [birth year]  Today's Date: 03/13/2017 OT Individual Time: 1417-1500 OT Individual Time Calculation (min): 43 min    Patient has met 7 of 7 long term goals due to improved activity tolerance, improved balance, postural control, ability to compensate for deficits and improved coordination.  Patient to discharge at overall Modified Independent level.  Patient's care partner is independent to provide the necessary physical assistance for higher level iADL at discharge.    Reasons goals not met: n/a  Recommendation:  Patient will benefit from ongoing skilled OT services in outpatient setting to continue to advance functional skills in the area of fine motor coordination.  Equipment: 3-in-1 BSC  Reasons for discharge: treatment goals met and discharge from hospital  Patient/family agrees with progress made and goals achieved: Yes  OT Discharge Precautions/Restrictions  Precautions Precautions: Fall Restrictions Weight Bearing Restrictions: No Pain Pain Assessment Pain Assessment: No/denies pain ADL ADL Eating: Modified independent Where Assessed-Eating: Chair Grooming: Modified independent Where Assessed-Grooming: Standing at sink Upper Body Bathing: Modified independent Where Assessed-Upper Body Bathing: Shower Lower Body Bathing: Modified independent Where Assessed-Lower Body Bathing: Shower Upper Body Dressing: Independent Where Assessed-Upper Body Dressing: Edge of bed Lower Body Dressing: Modified independent Toileting: Modified independent Where Assessed-Toileting: Glass blower/designer: Diplomatic Services operational officer Method: Counselling psychologist: Energy manager: Chief Financial Officer Method: Heritage manager: Grab bars ADL Comments: Please see functional  navigator [employer]: Within Designer, television/film set  Perception: Within Functional Limits Praxis Praxis: Intact Cognition Overall Cognitive Status: Within Functional Limits for tasks assessed Arousal/Alertness: Awake/alert Orientation Level: Oriented X4 Safety/Judgment: Appears intact Sensation Sensation Light Touch: Appears Intact Stereognosis: Appears Intact Hot/Cold: Appears Intact Coordination Gross Motor Movements are Fluid and Coordinated: Yes Fine Motor Movements are Fluid and Coordinated: Yes Motor  Motor Motor: Ataxia Mobility  Bed Mobility Bed Mobility: Supine to Sit Supine to Sit: 6: Modified independent (Device/Increase time) Transfers Sit to Stand: 6: Modified independent (Device/Increase time) Stand to Sit: 6: Modified independent (Device/Increase time)  Trunk/Postural Assessment  Cervical Assessment Cervical Assessment: Within Functional Limits Thoracic Assessment Thoracic Assessment: Within Functional Limits Lumbar Assessment Lumbar Assessment: Within Functional Limits Postural Control Postural Control: Deficits on evaluation Trunk Control: ataxic Protective Responses: decreased  Balance Balance Balance Assessed: Yes Static Standing Balance Static Standing - Balance Support: During functional activity Static Standing - Level of Assistance: 6: Modified independent (Device/Increase time) Dynamic Standing Balance Dynamic Standing - Balance Support: During functional activity Dynamic Standing - Level of Assistance: 6: Modified independent (Device/Increase time) Extremity/Trunk Assessment RUE Assessment RUE Assessment: Within Functional Limits LUE Assessment LUE Assessment: Within Functional Limits (slight ataxia)   See Function Navigator for Current Functional Status.  Daneen Schick Thuy Atilano 03/13/2017, 3:13 PM

## 2017-03-13 NOTE — Progress Notes (Signed)
Physical Therapy Discharge Summary  Patient Details  Name: Brydan Downard MRN: [id number] Date of Birth: 08/28/1975  Today's Date: 03/13/2017 PT Individual Time: 0900-1000 PT Individual Time Calculation (min): 60 min    Patient has met 6 of 8 long term goals due to improved activity tolerance, improved balance, increased strength, improved attention, improved awareness and improved coordination.  Patient to discharge at an ambulatory level Modified Independent.   Patient's care partner is independent to provide the necessary physical assistance at discharge.  Reasons goals not met: Due to length of stay pt demonstrates ambulation at supervision level for community distances and stairs.   Recommendation:  Patient will benefit from ongoing skilled PT services in outpatient setting to continue to advance safe functional mobility, address ongoing impairments in balance, coordination, and safety awareness, and minimize fall risk.   Reasons for discharge: treatment goals met  Patient/family agrees with progress made and goals achieved: Yes  PT Discharge Pain Pain Assessment Pain Assessment: No/denies pain  Mobility Bed Mobility Bed Mobility: Supine to Sit Supine to Sit: 6: Modified independent (Device/Increase time) Transfers Transfers: Yes Sit to Stand: 6: Modified independent (Device/Increase time) Stand to Sit: 6: Modified independent (Device/Increase time) Locomotion  Ambulation Ambulation: Yes Ambulation/Gait Assistance: 5: Supervision Assistive device: Straight cane Stairs / Additional Locomotion Stairs: Yes Stairs Assistance: 5: Supervision Stair Management Technique: One rail Left Height of Stairs: 6 Curb: 5: Supervision  Trunk/Postural Assessment  Cervical Assessment Cervical Assessment: Within Functional Limits Thoracic Assessment Thoracic Assessment: Within Functional Limits Lumbar Assessment Lumbar Assessment: Within Functional Limits Postural  Control Postural Control: Deficits on evaluation Trunk Control: ataxic Protective Responses: decreased  Balance Balance Balance Assessed: Yes Static Standing Balance Static Standing - Balance Support: During functional activity Static Standing - Level of Assistance: 6: Modified independent (Device/Increase time) Dynamic Standing Balance Dynamic Standing - Balance Support: During functional activity Dynamic Standing - Level of Assistance: 6: Modified independent (Device/Increase time) Extremity Assessment      RLE Assessment RLE Assessment: Within Functional Limits LLE Assessment LLE Assessment: Within Functional Limits   See Function Navigator for Current Functional Status.  Rosita DeChalus 03/13/2017, 2:09 PM

## 2017-03-14 MED ORDER — ATORVASTATIN CALCIUM 80 MG PO TABS: 80.0000 mg | ORAL_TABLET | Freq: Every day | ORAL | 0 refills | Status: DC

## 2017-03-14 MED ORDER — ASPIRIN 325 MG PO TABS: 325.0000 mg | ORAL_TABLET | Freq: Every day | ORAL | Status: DC

## 2017-03-14 MED ORDER — RIVAROXABAN 20 MG PO TABS: 20.0000 mg | ORAL_TABLET | Freq: Every day | ORAL | 3 refills | Status: DC

## 2017-03-14 MED ORDER — ACETAMINOPHEN-CODEINE #3 300-30 MG PO TABS: 1.0000 | ORAL_TABLET | Freq: Four times a day (QID) | ORAL | 0 refills | Status: DC | PRN

## 2017-03-14 NOTE — Progress Notes (Signed)
Patient discharged about 0930 to home with family and all discharge instructions given by Harvel Ricks PA. Patient denied any questions. Education completed. Patient vitals stable.

## 2017-03-14 NOTE — Progress Notes (Signed)
Speech Language Pathology Discharge Summary  Patient Details  Name: Logan [name] MRN: [id number] Date of Birth: 16-Nov-1975  Patient has met 1 of 1 long term goals.  Patient to discharge at overall Modified Independent level.   Reasons goals not met: N/A   Clinical Impression/Discharge Summary: Patient has made functional gains and has met 1 of 1 LTG's this admission. Currently, patient is overall Mod I to self-monitor and correct articulatory errors in order to achieve 100% intelligibility at the conversation level. Patient education is complete and patient will discharge home with assistance from family. Since patient is 100% intelligible, skilled SLP f/u is not warranted at this time.   Care Partner:  Caregiver Able to Provide Assistance: Yes     Recommendation: None   Equipment: N/A   Reasons for discharge: Treatment goals met;Discharged from hospital   Patient/Family Agrees with Progress Made and Goals Achieved: Yes     [place], Caban 03/14/2017, 6:41 AM

## 2017-03-14 NOTE — Progress Notes (Signed)
Social Work  Discharge Note  The overall goal for the admission was met for:   Discharge location: Yes-HOME WITH BROTHER IN-LAW AND [employer] WHO CAN PROVIDE 24 HR SUPERVISION  Length of Stay: Yes-6 DAYS  Discharge activity level: Yes-MOD/I LEVEL  Home/community participation: Yes  Services provided included: MD, RD, PT, OT, SLP, RN, CM, Pharmacy and SW  Financial Services: Private Insurance: [facility]  Follow-up services arranged: Outpatient: [facility], OT,SP-WILL CONTACT AT HOME TO SCHEDULE APPOINTMENTS, DME: [facility] [place] and Patient/Family has no preference for HH/DME agencies  Comments (or additional information):PT [place]  Patient/Family verbalized understanding of follow-up arrangements: Yes  Individual responsible for coordination of the follow-up plan: SELF & BROTHER IN-LAW  Confirmed correct DME delivered: Elease Hashimoto 03/14/2017    Elease Hashimoto

## 2017-03-14 NOTE — Progress Notes (Signed)
Subjective/Complaints: No issues overnite, looking fwd to d/c Daughter will drive him Pt had no DL prior to CVA  ROS-  Neg for CP/SOB, N/V/D  Objective: Vital Signs: Blood pressure (!) 98/51, pulse 62, temperature 97.7 F (36.5 C), temperature source Oral, resp. rate 18, height  (1.727 m), weight 68.6 kg (151 lb 3.8 oz), SpO2 98 %. No results found. No results found for this or any previous visit (from the past 72 hour(s)).   HEENT: normal Cardio: RRR and no murmur Resp: CTA B/L and unlabored GI: BS positive and NT, ND Extremity:  Pulses positive and No Edema Skin:   Intact Neuro: Alert/Oriented, Normal Sensory and Normal Motor Musc/Skel:  Normal Gen NAD   Assessment/Plan: 1. 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 Stable for D/C today F/u PCP in 3-4 weeks F/u PM&R 2 weeks See D/C summary See D/C instructions FIM: Function - Bathing Bathing activity did not occur: Refused Position: Shower Body parts bathed by patient: Right arm, Left arm, Chest, Abdomen, Front perineal area, Buttocks, Right upper leg, Right lower leg, Left upper leg, Left lower leg Body parts bathed by helper: Back Assist Level: No help, No cues  Function- Upper Body Dressing/Undressing What is the patient wearing?: Pull over shirt/dress Pull over shirt/dress - Perfomed by patient: Thread/unthread right sleeve, Thread/unthread left sleeve, Put head through opening, Pull shirt over trunk Assist Level: No help, No cues Set up : To obtain clothing/put away Function - Lower Body Dressing/Undressing What is the patient wearing?: Pants, Underwear, Socks, Shoes Position: Sitting EOB Underwear - Performed by patient: Thread/unthread right underwear leg, Thread/unthread left underwear leg, Pull underwear up/down Pants- Performed by patient: Thread/unthread left pants leg, Thread/unthread right pants leg, Pull pants up/down Socks - Performed by patient: Don/doff right sock, Don/doff left  sock Shoes - Performed by patient: Don/doff right shoe, Don/doff left shoe, Fasten right, Fasten left Assist for footwear: Independent Assist for lower body dressing: No Help, No cues  Function - Toileting Toileting steps completed by patient: Adjust clothing prior to toileting, Performs perineal hygiene, Adjust clothing after toileting Toileting Assistive Devices: Grab bar or rail Assist level: No help/no cues  Function Programmer, multimedia transfer assistive device: Walker Assist level to toilet: No Help, No cues Assist level from toilet: No Help, No cues  Function - Chair/bed transfer Chair/bed transfer method: Ambulatory Chair/bed transfer assist level: Supervision or verbal cues Chair/bed transfer assistive device: Armrests  Function - Locomotion: Wheelchair Will patient use wheelchair at discharge?: No Function - Locomotion: Ambulation Assistive device: Cane-straight Max distance: 300 ft Assist level: Supervision or verbal cues Assist level: Supervision or verbal cues Assist level: Supervision or verbal cues Assist level: Supervision or verbal cues Assist level: Supervision or verbal cues  Function - Comprehension Comprehension: Auditory Comprehension assist level: Follows complex conversation/direction with no assist  Function - Expression Expression: Verbal Expression assist level: Expresses complex ideas: With no assist  Function - Social Interaction Social Interaction assist level: Interacts appropriately with others - No medications needed.  Function - Problem Solving Problem solving assist level: Solves complex problems: Recognizes & self-corrects  Function - Memory Memory assist level: Complete Independence: No helper Patient normally able to recall (first 3 days only): Current season, Location of own room, Staff names and faces, That he or she is in a hospital   Medical Problem List and Plan: 1.  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  DVT Prophylaxis/Anticoagulation: Cont xarelto 20 mg daily   3. Pain Management: Tylenol No. 3 4. Mood: Provide emotional support 5. Neuropsych: This patient is capable of making decisions on his own behalf. 6. Skin/Wound Care: Routine skin checks 7. Fluids/Electrolytes/Nutrition: Routine I&O with follow-up chemistries 8. History of ischemic cardiomyopathy left ventricular mural thrombus in 2014, CAD x 3 in 2014 9. History of tobacco and marijuana use. Urine drug screen positive for marijuana. Provide counseling 10. Systolic congestive heart failure. Monitor for any signs of fluid overload EF 20-25% 11. Medical noncompliance. Counseling 12. Hyperlipidemia. Lipitor LOS (Days) 6 A FACE TO FACE EVALUATION WAS PERFORMED  Logan [name] E 03/14/2017, 7:47 AM

## 2017-03-14 NOTE — Discharge Summary (Signed)
NAME:  Logan [name], Logan [name] NO.:  MEDICAL RECORD NO.:  [id number]  LOCATION:                                 FACILITY:  PHYSICIAN:  Erick Colace, M.D.DATE OF BIRTH:  [birth year]  DATE OF ADMISSION:  03/08/2017 DATE OF DISCHARGE:  03/14/2017                              DISCHARGE SUMMARY   DISCHARGE DIAGNOSES: 1. Right superior cerebellar infarction. 2. Xarelto for deep vein thrombosis prophylaxis. 3. Pain management. 4. History of ischemic cardiomyopathy with left ventricular mural     thrombus. 5. History of tobacco, marijuana use. 6. Systolic congestive heart failure. 7. Medical noncompliance. 8. Hyperlipidemia.  HISTORY OF PRESENT ILLNESS:  This is a 41 year old right-handed male with history of TIA, medical noncompliance, CAD with myocardial infarction, ischemic cardiomyopathy, left ventricular mural thrombus, maintained on Xarelto, as well as systolic congestive heart failure. Presented on March 03, 2017, with left-sided weakness, facial droop, slurred speech.  The patient lives with brother-in-law, reported to be independent prior to admission.  CT of the head showed right cerebellar CVA.  Per report, acute right superior cerebellar infarction, right occipital infarction.  The patient did not receive tPA.  Underwent right CEA T1 segment revascularization by Interventional Radiology.  TTE showed ejection fraction 20-25% with global hypokinesis.  Urine drug screen was positive for marijuana.  MRI on March 05, 2017, for persistent gait deficit showed confluent acute infarction in the superior right cerebellum, occipital temporal lobes.  Physical and occupational therapy ongoing.  The patient was admitted for comprehensive rehab program.  PAST MEDICAL HISTORY:  See discharge diagnoses.  SOCIAL HISTORY:  Lives with brother in law, independent prior to admission.  FUNCTIONAL STATUS UPON ADMISSION TO REHAB SERVICES:  Moderate assist 150 feet  one-person handheld assistance, minimal assist sit to stand, min to mod assist activities of daily living.  PHYSICAL EXAMINATION:  VITAL SIGNS:  Blood pressure 124/72, pulse 67, temperature 98, and respirations 16. GENERAL:  Alert male, in no acute distress, oriented x3. HEENT:  EOMs intact. NECK:  Supple, nontender.  No JVD. CARDIAC:  Rate controlled. ABDOMEN:  Soft, nontender.  Good bowel sounds. LUNGS:  Clear to auscultation without wheeze.  Noted facial droop. Motor strength 5/5.  REHABILITATION HOSPITAL COURSE:  The patient was admitted to Inpatient Rehab Services with therapies initiated on a 3-hour daily basis, consisting of physical therapy, occupational therapy, and rehabilitation nursing.  The following issues were addressed during the patient's rehab stay.  Pertaining to Mr. Bresler right superior cerebellar infarction, remained stable.  He would follow up with Neurology Services.  He would continue on Xarelto as prior to admission.  Noted history of ischemic cardiomyopathy, CAD x3.  No chest pain or shortness of breath.  Followup with Cardiology Services.  Urine drug screen positive for marijuana.  He did receive counsel in regard to cessation of illicit drug products.  No bowel or bladder disturbances.  The patient received weekly collaborative interdisciplinary team conferences to discuss estimated length of stay, family teaching, any barriers to his discharge.  He is able to perform toilet transfers supervision, ambulates to the sink to wash his hands with minimal assistance. Ambulated while performing changes in  gait speed with head turns. Performed cut moves to change directions, ambulate to the chair, gather belongings for activities of daily living and homemaking.  Full family teaching was completed and plan discharge to home.  DISCHARGE MEDICATIONS: 1. Aspirin 325 mg p.o. daily. 2. Lipitor 80 mg p.o. daily. 3. Xarelto 20 mg p.o. daily. 4. Tylenol No. 3 one  to two tablets every 6 hours as needed pain or     headache.  DIET:  His diet was regular.  FOLLOWUP:  The patient would follow up with [facility], Dr. Delia Heady, Neurology Service, call for appointment; [facility], [place], [facility], Medical Management.  SPECIAL INSTRUCTIONS:  No driving, smoking, or illicit drug products.     Mariam Dollar, P.A.   ______________________________ Erick Colace, M.D.    DA/MEDQ  D:  03/13/2017  T:  03/14/2017  Job:  [id number]  cc:   Pramod P. Pearlean Brownie, MD Erick Colace, M.D. [facility]

## 2017-03-16 ENCOUNTER — Telehealth: Payer: Self-pay | Admitting: *Deleted

## 2017-03-16 NOTE — Telephone Encounter (Signed)
Transitional care call 1st attempt, message played stating that the number you have dialed is not a working number

## 2017-03-16 NOTE — Telephone Encounter (Signed)
Trransitional care call 2nd attempt, attempted calling daughter who is listed as emergency contact. Call went to voicemail. I left a message encouraging a call back so follow up appointment could be established

## 2017-03-17 ENCOUNTER — Telehealth: Payer: Self-pay | Admitting: *Deleted

## 2017-03-17 NOTE — Telephone Encounter (Signed)
Transitional care call completed, appointment confirmed, NEW address confirmed, new patient packet sent  Transitional Care Questions   Questions for our staff to ask patients on Transitional care 48 hour phone call:   1. Are you/is patient experiencing any problems since coming home? No Are there any questions regarding any aspect of care? No  2. Are there any questions regarding medications administration/dosing? No Are meds being taken as prescribed?Yes  Patient should review meds with caller to confirm   3. Have there been any falls? No  4. Has Home Health been to the house and/or have they contacted you? Patient being scheduled Outpatient therapies, supposed to be contacted by [facility] not, have you tried to contact them? Can we help you contact them?   5. Are bowels and bladder emptying properly?  Yes Are there any unexpected incontinence issues? No  If applicable, is patient following bowel/bladder programs?   6. Any fevers, problems with breathing, unexpected pain?  No, No, No  7. Are there any skin problems or new areas of breakdown?  No  8. Has the patient/family member arranged specialty MD follow up (ie cardiology/neurology/renal/surgical/etc)? Patient advised to [place] [facility], Dr. Pearlean Brownie and Primary Care with [facility] Can we help arrange? No  9. Does the patient need any other services or support that we can help arrange? Awaiting call for DME   10. Are caregivers following through as expected in assisting the patient? Yes, mostly self-sufficient with some help  11. Has the patient quit smoking, drinking alcohol, or using drugs as recommended? No smok, No drink, No illicit drug use

## 2017-03-17 NOTE — Telephone Encounter (Signed)
Patient reported mobile phone number as [phone] and reports new address at [address], [place], [place], [address].  I am sharing this with concern that outside agencies such as [facility] may be having difficulties trying to contact patient.

## 2017-03-22 LAB — CUP PACEART REMOTE DEVICE CHECK
Date Time Interrogation Session: 20180919080628
Implantable Lead Location: 753862
Implantable Lead Model: 3010
MDC IDC LEAD IMPLANT DT: 20150925
MDC IDC PG IMPLANT DT: 20150925
MDC IDC PG SERIAL: 105963

## 2017-03-23 ENCOUNTER — Telehealth (HOSPITAL_COMMUNITY): Payer: Self-pay

## 2017-03-23 NOTE — Telephone Encounter (Signed)
Called to schedule f/u, no answer, no vm. AW 

## 2017-03-27 ENCOUNTER — Encounter: Payer: Medicare Other | Admitting: Physical Medicine & Rehabilitation

## 2017-03-27 ENCOUNTER — Encounter (HOSPITAL_COMMUNITY): Payer: Self-pay | Admitting: Physical Therapy

## 2017-03-30 ENCOUNTER — Telehealth: Payer: Self-pay | Admitting: *Deleted

## 2017-03-30 NOTE — Telephone Encounter (Addendum)
Logan [name] called to report that his right leg is not working right , bouncing and jerking, and he is having problems with left vision.   Because he has not been seen in our office since discharge from inpt rehab, and our office is closed ( Dr Wynn Banker is not available) I have instructed him to call Dr Marlis Edelson office (ph number provided) I also told him that if this is new he should seek treatment in urgent care or ED for evaluation. Otherwise Dr Wynn Banker will address in am. Please advise.

## 2017-03-31 NOTE — Telephone Encounter (Signed)
Left message with the patient to go to urgent care if this is a new issue. He should keep his appointments with neurology, PCP and myself. This could be related to his prior stroke. Given the location of his prior stroke and his current symptoms
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
## 2017-04-04 ENCOUNTER — Inpatient Hospital Stay: Payer: Medicare Other | Admitting: Physical Medicine & Rehabilitation

## 2017-04-05 ENCOUNTER — Encounter (INDEPENDENT_AMBULATORY_CARE_PROVIDER_SITE_OTHER): Payer: Self-pay

## 2017-04-05 ENCOUNTER — Ambulatory Visit (INDEPENDENT_AMBULATORY_CARE_PROVIDER_SITE_OTHER): Payer: Medicare Other | Admitting: Physician Assistant

## 2017-04-05 VITALS — BP 124/70 | HR 87 | Ht 68.0 in | Wt 148.0 lb

## 2017-04-05 DIAGNOSIS — I2510 Atherosclerotic heart disease of native coronary artery without angina pectoris: Secondary | ICD-10-CM

## 2017-04-05 DIAGNOSIS — I480 Paroxysmal atrial fibrillation: Secondary | ICD-10-CM

## 2017-04-05 DIAGNOSIS — Z95810 Presence of automatic (implantable) cardiac defibrillator: Secondary | ICD-10-CM

## 2017-04-05 DIAGNOSIS — I255 Ischemic cardiomyopathy: Secondary | ICD-10-CM | POA: Diagnosis not present

## 2017-04-05 NOTE — Patient Instructions (Addendum)
Medication Instructions:   Your physician recommends that you continue on your current medications as directed. Please refer to the Current Medication list given to you today.]  If you need a refill on your cardiac medications before your next appointment, please call your pharmacy.  Labwork: NONE ORDERED  TODAY    Testing/Procedures: NONE ORDERED  TODAY    Follow-Up:  Your physician wants you to follow-up in:  IN  6  MONTHS WITH DR  You will receive a reminder letter in the mail two months in advance. If you don't receive a letter, please call our office to schedule the follow-up appointment.    Remote monitoring is used to monitor your Pacemaker of ICD from home. This monitoring reduces the number of office visits required to check your device to one time per year. It allows [place] to keep an eye on the functioning of your device to ensure it is working properly. You are scheduled for a device check from home on . 05-29-17  You may send your transmission at any time that day. If you have a wireless device, the transmission will be sent automatically. After your physician reviews your transmission, you will receive a postcard with your next transmission date.     Any Other Special Instructions Will Be Listed Below (If Applicable).

## 2017-04-11 ENCOUNTER — Inpatient Hospital Stay: Payer: Medicare Other | Admitting: Physical Medicine & Rehabilitation

## 2017-04-11 ENCOUNTER — Encounter: Payer: Medicare Other | Attending: Physical Medicine & Rehabilitation

## 2017-04-20 ENCOUNTER — Ambulatory Visit: Payer: Medicare Other | Admitting: Speech Pathology

## 2017-04-20 ENCOUNTER — Ambulatory Visit: Payer: Medicare Other | Admitting: Occupational Therapy

## 2017-04-20 ENCOUNTER — Ambulatory Visit: Payer: Medicare Other | Attending: Physical Medicine & Rehabilitation

## 2017-05-09 ENCOUNTER — Other Ambulatory Visit (HOSPITAL_COMMUNITY): Payer: Self-pay | Admitting: Interventional Radiology

## 2017-05-09 DIAGNOSIS — I639 Cerebral infarction, unspecified: Secondary | ICD-10-CM

## 2017-05-29 ENCOUNTER — Telehealth: Payer: Self-pay | Admitting: Cardiology

## 2017-05-29 ENCOUNTER — Encounter: Payer: Medicare Other | Admitting: *Deleted

## 2017-05-29 NOTE — Telephone Encounter (Signed)
Spoke with pt and reminded pt of remote transmission that is due today. Pt verbalized understanding.   

## 2017-05-30 ENCOUNTER — Ambulatory Visit (HOSPITAL_COMMUNITY): Payer: Medicare Other

## 2017-06-01 ENCOUNTER — Encounter: Payer: Self-pay | Admitting: Cardiology

## 2017-06-19 ENCOUNTER — Other Ambulatory Visit: Payer: Self-pay

## 2017-06-20 ENCOUNTER — Ambulatory Visit (HOSPITAL_COMMUNITY): Payer: Medicare Other | Attending: Interventional Radiology

## 2017-08-11 ENCOUNTER — Other Ambulatory Visit: Payer: Self-pay | Admitting: Physician Assistant

## 2017-10-22 ENCOUNTER — Emergency Department: Payer: Medicare Other

## 2017-10-22 ENCOUNTER — Observation Stay
Admission: EM | Admit: 2017-10-22 | Discharge: 2017-10-23 | Disposition: A | Discharge status: Home / Self Care | Payer: Medicare Other | Attending: Internal Medicine | Admitting: Internal Medicine

## 2017-10-22 ENCOUNTER — Other Ambulatory Visit: Payer: Self-pay

## 2017-10-22 DIAGNOSIS — I255 Ischemic cardiomyopathy: Secondary | ICD-10-CM | POA: Insufficient documentation

## 2017-10-22 DIAGNOSIS — Z8673 Personal history of transient ischemic attack (TIA), and cerebral infarction without residual deficits: Secondary | ICD-10-CM | POA: Diagnosis not present

## 2017-10-22 DIAGNOSIS — K219 Gastro-esophageal reflux disease without esophagitis: Secondary | ICD-10-CM | POA: Diagnosis not present

## 2017-10-22 DIAGNOSIS — Z888 Allergy status to other drugs, medicaments and biological substances status: Secondary | ICD-10-CM | POA: Diagnosis not present

## 2017-10-22 DIAGNOSIS — I252 Old myocardial infarction: Secondary | ICD-10-CM | POA: Diagnosis not present

## 2017-10-22 DIAGNOSIS — G459 Transient cerebral ischemic attack, unspecified: Secondary | ICD-10-CM | POA: Diagnosis present

## 2017-10-22 DIAGNOSIS — I340 Nonrheumatic mitral (valve) insufficiency: Secondary | ICD-10-CM | POA: Insufficient documentation

## 2017-10-22 DIAGNOSIS — Z8249 Family history of ischemic heart disease and other diseases of the circulatory system: Secondary | ICD-10-CM | POA: Insufficient documentation

## 2017-10-22 DIAGNOSIS — I5022 Chronic systolic (congestive) heart failure: Secondary | ICD-10-CM | POA: Diagnosis not present

## 2017-10-22 DIAGNOSIS — Z951 Presence of aortocoronary bypass graft: Secondary | ICD-10-CM | POA: Diagnosis not present

## 2017-10-22 DIAGNOSIS — Z79899 Other long term (current) drug therapy: Secondary | ICD-10-CM | POA: Insufficient documentation

## 2017-10-22 DIAGNOSIS — Z9114 Patient's other noncompliance with medication regimen: Secondary | ICD-10-CM | POA: Insufficient documentation

## 2017-10-22 DIAGNOSIS — E785 Hyperlipidemia, unspecified: Secondary | ICD-10-CM | POA: Diagnosis not present

## 2017-10-22 DIAGNOSIS — Z7901 Long term (current) use of anticoagulants: Secondary | ICD-10-CM | POA: Diagnosis not present

## 2017-10-22 DIAGNOSIS — I2510 Atherosclerotic heart disease of native coronary artery without angina pectoris: Secondary | ICD-10-CM | POA: Insufficient documentation

## 2017-10-22 DIAGNOSIS — F17210 Nicotine dependence, cigarettes, uncomplicated: Secondary | ICD-10-CM | POA: Insufficient documentation

## 2017-10-22 DIAGNOSIS — Z880 Allergy status to penicillin: Secondary | ICD-10-CM | POA: Diagnosis not present

## 2017-10-22 DIAGNOSIS — Z91030 Bee allergy status: Secondary | ICD-10-CM | POA: Diagnosis not present

## 2017-10-22 DIAGNOSIS — Z7982 Long term (current) use of aspirin: Secondary | ICD-10-CM | POA: Diagnosis not present

## 2017-10-22 DIAGNOSIS — I110 Hypertensive heart disease with heart failure: Secondary | ICD-10-CM | POA: Insufficient documentation

## 2017-10-22 LAB — URINALYSIS, ROUTINE W REFLEX MICROSCOPIC
Bilirubin Urine: NEGATIVE
Glucose, UA: NEGATIVE mg/dL
KETONES UR: NEGATIVE mg/dL
Leukocytes, UA: NEGATIVE
Nitrite: NEGATIVE
PH: 6 (ref 5.0–8.0)
Protein, ur: 100 mg/dL — AB
Specific Gravity, Urine: 1.012 (ref 1.005–1.030)

## 2017-10-22 LAB — URINE DRUG SCREEN, QUALITATIVE (ARMC ONLY)
AMPHETAMINES, UR SCREEN: NOT DETECTED
Barbiturates, Ur Screen: NOT DETECTED
Benzodiazepine, Ur Scrn: NOT DETECTED
CANNABINOID 50 NG, UR ~~LOC~~: NOT DETECTED
COCAINE METABOLITE, UR ~~LOC~~: NOT DETECTED
MDMA (ECSTASY) UR SCREEN: NOT DETECTED
Methadone Scn, Ur: NOT DETECTED
Opiate, Ur Screen: NOT DETECTED
PHENCYCLIDINE (PCP) UR S: NOT DETECTED
TRICYCLIC, UR SCREEN: NOT DETECTED

## 2017-10-22 LAB — COMPREHENSIVE METABOLIC PANEL
ALT: 19 U/L (ref 17–63)
ANION GAP: 7 (ref 5–15)
AST: 30 U/L (ref 15–41)
Albumin: 4.1 g/dL (ref 3.5–5.0)
Alkaline Phosphatase: 80 U/L (ref 38–126)
BUN: 14 mg/dL (ref 6–20)
CO2: 25 mmol/L (ref 22–32)
CREATININE: 1.09 mg/dL (ref 0.61–1.24)
Calcium: 9.2 mg/dL (ref 8.9–10.3)
Chloride: 102 mmol/L (ref 101–111)
Glucose, Bld: 97 mg/dL (ref 65–99)
Potassium: 4.3 mmol/L (ref 3.5–5.1)
Sodium: 134 mmol/L — ABNORMAL LOW (ref 135–145)
Total Bilirubin: 1 mg/dL (ref 0.3–1.2)
Total Protein: 7.2 g/dL (ref 6.5–8.1)

## 2017-10-22 LAB — CBC
HCT: 45.5 % (ref 40.0–52.0)
Hemoglobin: 15.8 g/dL (ref 13.0–18.0)
MCH: 35.1 pg — AB (ref 26.0–34.0)
MCHC: 34.8 g/dL (ref 32.0–36.0)
MCV: 101 fL — AB (ref 80.0–100.0)
PLATELETS: 262 10*3/uL (ref 150–440)
RBC: 4.5 MIL/uL (ref 4.40–5.90)
RDW: 13.1 % (ref 11.5–14.5)
WBC: 6 10*3/uL (ref 3.8–10.6)

## 2017-10-22 LAB — ETHANOL: Alcohol, Ethyl (B): <10 mg/dL (ref <10)

## 2017-10-22 LAB — TROPONIN I: TROPONIN I: 0.05 ng/mL — AB (ref <0.03)

## 2017-10-22 MED ORDER — ACETAMINOPHEN 325 MG PO TABS: 650.0000 mg | ORAL_TABLET | Freq: Four times a day (QID) | ORAL | Status: DC | PRN

## 2017-10-22 MED ORDER — SODIUM CHLORIDE 0.9 % IV BOLUS
1000.0000 mL | Freq: Once | INTRAVENOUS | Status: AC
  Administered 2017-10-22: 1000 mL via INTRAVENOUS

## 2017-10-22 MED ORDER — ONDANSETRON HCL 4 MG/2ML IJ SOLN: 4.0000 mg | Freq: Four times a day (QID) | INTRAMUSCULAR | Status: DC | PRN

## 2017-10-22 MED ORDER — STROKE: EARLY STAGES OF RECOVERY BOOK
Freq: Once | Status: AC
  Administered 2017-10-22: 16:00:00

## 2017-10-22 MED ORDER — RIVAROXABAN 20 MG PO TABS
20.0000 mg | ORAL_TABLET | Freq: Once | ORAL | Status: AC
  Administered 2017-10-22: 20 mg via ORAL
  Filled 2017-10-22: qty 1

## 2017-10-22 MED ORDER — ATORVASTATIN CALCIUM 20 MG PO TABS
80.0000 mg | ORAL_TABLET | Freq: Every day | ORAL | Status: DC
  Administered 2017-10-23: 80 mg via ORAL
  Filled 2017-10-22: qty 4

## 2017-10-22 MED ORDER — SENNOSIDES-DOCUSATE SODIUM 8.6-50 MG PO TABS: 1.0000 | ORAL_TABLET | Freq: Every evening | ORAL | Status: DC | PRN

## 2017-10-22 MED ORDER — CARVEDILOL 3.125 MG PO TABS
6.2500 mg | ORAL_TABLET | Freq: Two times a day (BID) | ORAL | Status: DC
  Administered 2017-10-22 – 2017-10-23 (×2): 6.25 mg via ORAL
  Filled 2017-10-22 (×2): qty 2

## 2017-10-22 MED ORDER — SACUBITRIL-VALSARTAN 24-26 MG PO TABS
1.0000 | ORAL_TABLET | Freq: Two times a day (BID) | ORAL | Status: DC
  Administered 2017-10-23: 1 via ORAL
  Filled 2017-10-22 (×3): qty 1

## 2017-10-22 MED ORDER — FUROSEMIDE 20 MG PO TABS
20.0000 mg | ORAL_TABLET | Freq: Every day | ORAL | Status: DC
  Administered 2017-10-23: 09:00:00 20 mg via ORAL
  Filled 2017-10-22: qty 1

## 2017-10-22 MED ORDER — NITROGLYCERIN 0.4 MG/SPRAY TL SOLN: 1.0000 | Status: DC | PRN

## 2017-10-22 MED ORDER — SPIRONOLACTONE 12.5 MG HALF TABLET
12.5000 mg | ORAL_TABLET | Freq: Every day | ORAL | Status: DC
  Administered 2017-10-23: 09:00:00 12.5 mg via ORAL
  Filled 2017-10-22: qty 1

## 2017-10-22 MED ORDER — PROCHLORPERAZINE EDISYLATE 10 MG/2ML IJ SOLN: 10.0000 mg | Freq: Once | INTRAMUSCULAR | Status: DC

## 2017-10-22 MED ORDER — DIPHENHYDRAMINE HCL 50 MG/ML IJ SOLN
12.5000 mg | Freq: Once | INTRAMUSCULAR | Status: DC
Start: 2017-10-22 — End: 2017-10-22

## 2017-10-22 MED ORDER — LORATADINE 10 MG PO TABS: 10.0000 mg | ORAL_TABLET | Freq: Every day | ORAL | Status: DC | PRN

## 2017-10-22 MED ORDER — ACETAMINOPHEN 160 MG/5ML PO SOLN: 650.0000 mg | ORAL | Status: DC | PRN

## 2017-10-22 MED ORDER — ACETAMINOPHEN 325 MG PO TABS: 650.0000 mg | ORAL_TABLET | ORAL | Status: DC | PRN

## 2017-10-22 MED ORDER — ACETAMINOPHEN 650 MG RE SUPP: 650.0000 mg | RECTAL | Status: DC | PRN

## 2017-10-22 MED ORDER — SODIUM CHLORIDE 0.9 % IV SOLN
INTRAVENOUS | Status: DC
  Administered 2017-10-22: 17:00:00 via INTRAVENOUS

## 2017-10-22 MED ORDER — ASPIRIN EC 325 MG PO TBEC
325.0000 mg | DELAYED_RELEASE_TABLET | Freq: Every day | ORAL | Status: DC
  Administered 2017-10-23: 09:00:00 325 mg via ORAL
  Filled 2017-10-22: qty 1

## 2017-10-22 NOTE — ED Triage Notes (Signed)
Pt states that he had a stroke in august and had a stent placed in his brain at this time, pt reports 3 mini strokes this month, out of his xarleto due to no minutes on his phone to call his dr, pt states that since last night he has had intermittent numbness to the rt arm, slurred speech, and headache. Pt states that he hasn't slept in 24 hours due to being fearful of sleeping. Hx of 4 heart attacks, bypass surg and defibrillator
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
## 2017-10-22 NOTE — Progress Notes (Signed)
PT Cancellation Note  Patient Details Name: Damiyon Cogar MRN: [id number] DOB: 14-Jul-1975   Cancelled Treatment:    Reason Eval/Treat Not Completed: Medical issues which prohibited therapy. Pt with blurred vision, right jaw tingling pending Neurologist consult.  Pt's troponin elevated.  Will hold PT until pt more medically appropriate.     Encarnacion Chu PT, DPT 10/22/2017, 5:48 PM
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
## 2017-10-23 ENCOUNTER — Observation Stay (HOSPITAL_BASED_OUTPATIENT_CLINIC_OR_DEPARTMENT_OTHER)
Admit: 2017-10-23 | Discharge: 2017-10-23 | Disposition: A | Discharge status: Home / Self Care | Payer: Medicare Other | Attending: Internal Medicine | Admitting: Internal Medicine

## 2017-10-23 DIAGNOSIS — G459 Transient cerebral ischemic attack, unspecified: Secondary | ICD-10-CM | POA: Diagnosis not present

## 2017-10-23 LAB — HEMOGLOBIN A1C
Hgb A1c MFr Bld: 4.7 % — ABNORMAL LOW (ref 4.8–5.6)
Mean Plasma Glucose: 88.19 mg/dL

## 2017-10-23 LAB — LIPID PANEL
CHOL/HDL RATIO: 5.2 ratio
CHOLESTEROL: 203 mg/dL — AB (ref 0–200)
HDL: 39 mg/dL — ABNORMAL LOW (ref >40)
LDL Cholesterol: 149 mg/dL — ABNORMAL HIGH (ref 0–99)
Triglycerides: 75 mg/dL (ref <150)
VLDL: 15 mg/dL (ref 0–40)

## 2017-10-23 LAB — ECHOCARDIOGRAM COMPLETE
HEIGHTINCHES: 68 in
WEIGHTICAEL: 2261.04 [oz_av]

## 2017-10-23 MED ORDER — RIVAROXABAN 20 MG PO TABS: 20.0000 mg | ORAL_TABLET | Freq: Every day | ORAL | 3 refills | Status: DC

## 2017-10-23 MED ORDER — PERFLUTREN LIPID MICROSPHERE
1.0000 mL | INTRAVENOUS | Status: AC | PRN
  Administered 2017-10-23: 1.5 mL via INTRAVENOUS
  Filled 2017-10-23: qty 10

## 2017-10-23 NOTE — Progress Notes (Signed)
*  PRELIMINARY RESULTS* Echocardiogram 2D Echocardiogram has been performed.  Cristela Blue 10/23/2017, 9:40 AM

## 2017-10-23 NOTE — Progress Notes (Signed)
*  PRELIMINARY RESULTS* Echocardiogram 2D Echocardiogram has been performed.  Logan [name] 10/23/2017, 9:34 AM

## 2017-10-23 NOTE — Care Management Obs Status (Signed)
MEDICARE OBSERVATION STATUS NOTIFICATION   Patient Details  Name: Logan [name] MRN: [id number] Date of Birth: 05-21-1976   Medicare Observation Status Notification Given:  Yes    Gwenette Greet, RN 10/23/2017, 8:11 AM

## 2017-10-23 NOTE — Progress Notes (Signed)
PT Cancellation Note  Patient Details Name: Zephan Drobnick MRN: [id number] DOB: 1976-07-03   Cancelled Treatment:    Reason Eval/Treat Not Completed: Patient declined, no reason specified(Consult received and chart reviewed.  Patient currently denies for PT intervention.  States all symptoms have fully resolved and mobility is back to baseline.  RN comfirms patient indep in room without difficulty, safety concern.  Will sign-off at this time, as no acute PT needs identified.  Please re-consult should needs change prior to discharge.)   Malala Trenkamp H. Manson Passey, PT, DPT, NCS 10/23/17, 9:20 AM [phone]

## 2017-10-23 NOTE — Progress Notes (Signed)
SLP Cancellation Note  Patient Details Name: Logan [name] MRN: [id number] DOB: 03/03/1976   Cancelled treatment:       Reason Eval/Treat Not Completed: SLP screened, no needs identified, will sign off(chart reviewed; consulted NSG then met w/ pt). Pt denied any difficulty swallowing and is currently on a regular diet; tolerates swallowing pills w/ water per NSG. Pt conversed at conversational level w/out deficits noted; pt denied any speech-language deficits.  No further skilled ST services indicated as pt appears at his baseline. Pt agreed. NSG to reconsult if any change in status.     Orinda Kenner, MS, CCC-SLP Watson,Katherine 10/23/2017, 10:24 AM
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
## 2017-10-23 NOTE — Care Management Note (Signed)
Case Management Note  Patient Details  Name: Logan [name] MRN: [id number] Date of Birth: [birth year]  Subjective/Objective:  Admitted to [facility] under observation status with the diagnosis of TIA. Lives with brother. Daughter is [name] [phone]). Prescriptions are filled at [facility] No primary care physician. States the last physician he seen was "stroke doctor in [place]." No home Health. No skilled facility. No medical equipment in the home. No falls. Good appetite.                   Action/Plan: Will give list of physicians accepting new patients   Expected Discharge Date:                  Expected Discharge Plan:     In-House Referral:     Discharge planning Services     Post Acute Care Choice:    Choice offered to:     DME Arranged:    DME Agency:     HH Arranged:    HH Agency:     Status of Service:     If discussed at [employer] of Stay Meetings, dates discussed:    Additional Comments:  Gwenette Greet, RN MSN CCM Care Management [phone] 10/23/2017, 8:31 AM

## 2017-10-23 NOTE — Progress Notes (Signed)
OT Screen Note  Patient Details Name: Logan [name] MRN: [id number] DOB: [birth year]   Cancelled Treatment:    Reason Eval/Treat Not Completed: OT screened, no needs identified, will sign off. Order received, chart reviewed. Pt back to baseline functional independence. No skilled OT needs identified. Will sign off. Please re-consult if additional needs arise.   Richrd Prime, MPH, MS, OTR/L ascom [phone] 10/23/17, 9:49 AM

## 2017-10-23 NOTE — Discharge Planning (Signed)
Patient IV and tele removed.  RN assessment and VS revealed stability for DC to home.  Discharge papers given, explained and educated. Informed of suggested FU appointment and attempted to set up appt (However, x2 out of 3 offices indicated that patient had "skipped" on prior appts and therefore they would not be able to see patient.  Patient indicated that he should be able to get into Cardio offices  - since they had recently contacted him for a yearly appt.  Then once there, he will see if they can set him up with a neurologist and PCP.  Patient currently has NO PCP.  Patient agreed. Script e-scribed to pharm.  Wheeled to front and family transporting home via car.

## 2017-10-27 NOTE — Discharge Summary (Addendum)
[facility] [place] at [facility]   PATIENT NAME: Logan [name]    MR#:  [id number]  DATE OF BIRTH:  [birth year]  DATE OF ADMISSION:  10/22/2017   ADMITTING PHYSICIAN: Shaune Pollack, MD  DATE OF DISCHARGE: 10/23/2017  3:25 PM  PRIMARY CARE PHYSICIAN: [name], MD   ADMISSION DIAGNOSIS:  TIA (transient ischemic attack) [G45.9] DISCHARGE DIAGNOSIS:  Active Problems:   TIA (transient ischemic attack)  SECONDARY DIAGNOSIS:   Past Medical History:  Diagnosis Date  . Anginal pain (HCC)   . CAD (coronary artery disease) 01/22/12   a.anterolateral MI with DES to LAD EF 25% b. reinfarction 8/13 with thrombosis of stent c. s/p CABG x 3: LIMA-LAD, SVG-OM, SVG-RCA (07/2012)   . Chronic systolic CHF (congestive heart failure) (HCC)    a. (07/2012) EF 20-25% diff HK with reg wall abnl, HK anterolat wall, akinesis inferosep, mod MR;  b. 01/2014 Echo: EF 10-15%, glob HK, mod MR.  Marland Kitchen GERD (gastroesophageal reflux disease)   . Hyperlipidemia   . Ischemic cardiomyopathy    a.  01/2014 Echo: EF 10-15%, glob HK;  b. 03/2014 S/P Subcutaneous ICD.  . LV (left ventricular) mural thrombus    "just found it in Oct" (07/09/2012)  . Moderate mitral regurgitation   . Myocardial infarction (HCC) 01/2012; 02/2012; 04/2012  . Noncompliance   . Stroke [facility]) 04/2014   TIA in the setting of medical non-compliance with coumadin requiring hospitalization.  . Syncope   . Tobacco abuse    HOSPITAL COURSE:  42 y.o. male with a history of stroke and ischemic cardiomyopathy on Xarelto who stopped taking his Xarelto at the end of February.  Reports that for the 24 hours prior to admission had 3 episodes of right sided neurological deficits.  They included right sided numbness/weakness and difficulty with speech.  They only lasted a few minutes and would resolve spontaneously.    * TIA:  -  Meds Compliance stressed. Resume Xarelto. Prescription given - Smoking cessation counseling - Statin for  lipid management with target LDL<70.  Current LDL 149.  A1c normal.   - no residual deficits  - On Xarelto and has had no further events in greater than 24 hours so after d/w Neurology patient was D/C home in stable condition. DISCHARGE CONDITIONS:  stable CONSULTS OBTAINED:  Treatment Team:  Thana Farr, MD DRUG ALLERGIES:   Allergies  Allergen Reactions  . Adhesive [Tape] Rash    "paper tape is ok" (07/09/2012)  . Bee Venom Anaphylaxis  . Penicillins Rash    Has patient had a PCN reaction causing immediate rash, facial/tongue/throat swelling, SOB or lightheadedness with hypotension: Yes Has patient had a PCN reaction causing severe rash involving mucus membranes or skin necrosis: No Has patient had a PCN reaction that required hospitalization No Has patient had a PCN reaction occurring within the last 10 years: No If all of the above answers are "NO", then may proceed with Cephalosporin use.    DISCHARGE MEDICATIONS:   Allergies as of 10/23/2017      Reactions   Adhesive [tape] Rash   "paper tape is ok" (07/09/2012)   Bee Venom Anaphylaxis   Penicillins Rash   Has patient had a PCN reaction causing immediate rash, facial/tongue/throat swelling, SOB or lightheadedness with hypotension: Yes Has patient had a PCN reaction causing severe rash involving mucus membranes or skin necrosis: No Has patient had a PCN reaction that required hospitalization No Has patient had a PCN reaction occurring  within the last 10 years: No If all of the above answers are "NO", then may proceed with Cephalosporin use.      Medication List    TAKE these medications   acetaminophen-codeine 300-30 MG tablet Commonly known as:  TYLENOL #3 Take 1-2 tablets by mouth every 6 (six) hours as needed for moderate pain.   aspirin 325 MG tablet Take 1 tablet (325 mg total) by mouth daily.   atorvastatin 80 MG tablet Commonly known as:  LIPITOR Take 1 tablet (80 mg total) by mouth daily at 6 PM. What  changed:  when to take this   carvedilol 6.25 MG tablet Commonly known as:  COREG Take 1 tablet (6.25 mg total) by mouth 2 (two) times daily with a meal.   furosemide 20 MG tablet Commonly known as:  LASIX Take 20 mg by mouth daily.   ibuprofen 200 MG tablet Commonly known as:  ADVIL,MOTRIN Take 200 mg by mouth every 6 (six) hours as needed for headache.   loratadine 10 MG tablet Commonly known as:  CLARITIN Take 10 mg by mouth daily as needed for allergies.   nitroGLYCERIN 0.4 MG/SPRAY spray Commonly known as:  NITROLINGUAL Place 1 spray under the tongue every 5 (five) minutes x 3 doses as needed for chest pain.   rivaroxaban 20 MG Tabs tablet Commonly known as:  XARELTO Take 1 tablet (20 mg total) by mouth daily with supper.   sacubitril-valsartan 24-26 MG Commonly known as:  ENTRESTO Take 1 tablet by mouth 2 (two) times daily.   spironolactone 25 MG tablet Commonly known as:  ALDACTONE Take 12.5 mg by mouth daily.        DISCHARGE INSTRUCTIONS:   DIET:  Regular diet DISCHARGE CONDITION:  Good ACTIVITY:  Activity as tolerated OXYGEN:  Home Oxygen: No.  Oxygen Delivery: room air DISCHARGE LOCATION:  home   If you experience worsening of your admission symptoms, develop shortness of breath, life threatening emergency, suicidal or homicidal thoughts you must seek medical attention immediately by calling 911 or calling your MD immediately  if symptoms less severe.  You Must read complete instructions/literature along with all the possible adverse reactions/side effects for all the Medicines you take and that have been prescribed to you. Take any new Medicines after you have completely understood and accpet all the possible adverse reactions/side effects.   Please note  You were cared for by a hospitalist during your hospital stay. If you have any questions about your discharge medications or the care you received while you were in the hospital after you are  discharged, you can call the unit and asked to speak with the hospitalist on call if the hospitalist that took care of you is not available. Once you are discharged, your primary care physician will handle any further medical issues. Please note that NO REFILLS for any discharge medications will be authorized once you are discharged, as it is imperative that you return to your primary care physician (or establish a relationship with a primary care physician if you do not have one) for your aftercare needs so that they can reassess your need for medications and monitor your lab values.    On the day of Discharge:  VITAL SIGNS:  Blood pressure 90/77, pulse 65, temperature 97.7 F (36.5 C), temperature source Oral, resp. rate 18, height 5\' 8"  (1.727 m), weight 64.1 kg (141 lb 5 oz), SpO2 97 %. PHYSICAL EXAMINATION:  GENERAL:  42 y.o.-year-old patient lying in the bed with  no acute distress.  EYES: Pupils equal, round, reactive to light and accommodation. No scleral icterus. Extraocular muscles intact.  HEENT: Head atraumatic, normocephalic. Oropharynx and nasopharynx clear.  NECK:  Supple, no jugular venous distention. No thyroid enlargement, no tenderness.  LUNGS: Normal breath sounds bilaterally, no wheezing, rales,rhonchi or crepitation. No use of accessory muscles of respiration.  CARDIOVASCULAR: S1, S2 normal. No murmurs, rubs, or gallops.  ABDOMEN: Soft, non-tender, non-distended. Bowel sounds present. No organomegaly or mass.  EXTREMITIES: No pedal edema, cyanosis, or clubbing.  NEUROLOGIC: Cranial nerves II through XII are intact. Muscle strength 5/5 in all extremities. Sensation intact. Gait not checked.  PSYCHIATRIC: The patient is alert and oriented x 3.  SKIN: No obvious rash, lesion, or ulcer.  DATA REVIEW:   CBC Recent Labs  Lab 10/22/17 1307  WBC 6.0  HGB 15.8  HCT 45.5  PLT 262    Chemistries  Recent Labs  Lab 10/22/17 1307  NA 134*  K 4.3  CL 102  CO2 25  GLUCOSE  97  BUN 14  CREATININE 1.09  CALCIUM 9.2  AST 30  ALT 19  ALKPHOS 80  BILITOT 1.0    Follow-up Information    [name], MD. Schedule an appointment as soon as possible for a visit in 5 days.   Specialty:  Internal Medicine Contact information: [address] [place] [address] [phone]        Bensimhon, Bevelyn Buckles, MD. Schedule an appointment as soon as possible for a visit in 2 weeks.   Specialty:  Cardiology Contact information: [address] [place] [address] [phone]        Micki Riley, MD. Schedule an appointment as soon as possible for a visit in 1 week(s).   Specialties:  Neurology, Radiology Contact information: [address] [place] [address] [phone]            Management plans discussed with the patient, family and they are in agreement.  CODE STATUS: Prior   TOTAL TIME TAKING CARE OF THIS PATIENT: 45 minutes.    Delfino Lovett M.D on 10/27/2017 at 2:37 PM  Between 7am to 6pm - Pager - [phone]  After 6pm go to [email or web address] - Social research officer, government  [facility] [place] [facility]  Office  [phone]  CC: Primary care physician; [name], MD   Note: This dictation was prepared with Dragon dictation along with smaller phrase technology. Any transcriptional errors that result from this process are unintentional.

## 2017-12-08 ENCOUNTER — Emergency Department: Payer: Medicare Other

## 2017-12-08 ENCOUNTER — Encounter: Payer: Self-pay | Admitting: *Deleted

## 2017-12-08 ENCOUNTER — Other Ambulatory Visit: Payer: Self-pay

## 2017-12-08 ENCOUNTER — Inpatient Hospital Stay
Admission: EM | Admit: 2017-12-08 | Discharge: 2017-12-10 | DRG: 069 | Disposition: A | Discharge status: Home / Self Care | Payer: Medicare Other | Attending: Internal Medicine | Admitting: Internal Medicine

## 2017-12-08 DIAGNOSIS — Z880 Allergy status to penicillin: Secondary | ICD-10-CM | POA: Diagnosis not present

## 2017-12-08 DIAGNOSIS — Z8673 Personal history of transient ischemic attack (TIA), and cerebral infarction without residual deficits: Secondary | ICD-10-CM | POA: Diagnosis not present

## 2017-12-08 DIAGNOSIS — I255 Ischemic cardiomyopathy: Secondary | ICD-10-CM | POA: Diagnosis present

## 2017-12-08 DIAGNOSIS — I480 Paroxysmal atrial fibrillation: Secondary | ICD-10-CM | POA: Diagnosis not present

## 2017-12-08 DIAGNOSIS — G459 Transient cerebral ischemic attack, unspecified: Principal | ICD-10-CM | POA: Diagnosis present

## 2017-12-08 DIAGNOSIS — Z91048 Other nonmedicinal substance allergy status: Secondary | ICD-10-CM | POA: Diagnosis not present

## 2017-12-08 DIAGNOSIS — F1010 Alcohol abuse, uncomplicated: Secondary | ICD-10-CM | POA: Diagnosis present

## 2017-12-08 DIAGNOSIS — I2510 Atherosclerotic heart disease of native coronary artery without angina pectoris: Secondary | ICD-10-CM | POA: Diagnosis present

## 2017-12-08 DIAGNOSIS — K219 Gastro-esophageal reflux disease without esophagitis: Secondary | ICD-10-CM | POA: Diagnosis not present

## 2017-12-08 DIAGNOSIS — Z8249 Family history of ischemic heart disease and other diseases of the circulatory system: Secondary | ICD-10-CM | POA: Diagnosis not present

## 2017-12-08 DIAGNOSIS — Z95810 Presence of automatic (implantable) cardiac defibrillator: Secondary | ICD-10-CM | POA: Diagnosis not present

## 2017-12-08 DIAGNOSIS — F17210 Nicotine dependence, cigarettes, uncomplicated: Secondary | ICD-10-CM | POA: Diagnosis not present

## 2017-12-08 DIAGNOSIS — I252 Old myocardial infarction: Secondary | ICD-10-CM | POA: Diagnosis not present

## 2017-12-08 DIAGNOSIS — Z91030 Bee allergy status: Secondary | ICD-10-CM

## 2017-12-08 DIAGNOSIS — E785 Hyperlipidemia, unspecified: Secondary | ICD-10-CM | POA: Diagnosis present

## 2017-12-08 DIAGNOSIS — Z7901 Long term (current) use of anticoagulants: Secondary | ICD-10-CM | POA: Diagnosis not present

## 2017-12-08 DIAGNOSIS — I340 Nonrheumatic mitral (valve) insufficiency: Secondary | ICD-10-CM | POA: Diagnosis present

## 2017-12-08 DIAGNOSIS — Z951 Presence of aortocoronary bypass graft: Secondary | ICD-10-CM | POA: Diagnosis not present

## 2017-12-08 DIAGNOSIS — Z955 Presence of coronary angioplasty implant and graft: Secondary | ICD-10-CM | POA: Diagnosis not present

## 2017-12-08 DIAGNOSIS — R200 Anesthesia of skin: Secondary | ICD-10-CM

## 2017-12-08 DIAGNOSIS — Z7982 Long term (current) use of aspirin: Secondary | ICD-10-CM | POA: Diagnosis not present

## 2017-12-08 DIAGNOSIS — I5022 Chronic systolic (congestive) heart failure: Secondary | ICD-10-CM | POA: Diagnosis present

## 2017-12-08 LAB — CBC
HCT: 43.6 % (ref 40.0–52.0)
Hemoglobin: 15.3 g/dL (ref 13.0–18.0)
MCH: 35.2 pg — AB (ref 26.0–34.0)
MCHC: 35.1 g/dL (ref 32.0–36.0)
MCV: 100.3 fL — ABNORMAL HIGH (ref 80.0–100.0)
Platelets: 253 10*3/uL (ref 150–440)
RBC: 4.35 MIL/uL — ABNORMAL LOW (ref 4.40–5.90)
RDW: 12.1 % (ref 11.5–14.5)
WBC: 9.2 10*3/uL (ref 3.8–10.6)

## 2017-12-08 LAB — PROTIME-INR
INR: 0.91
PROTHROMBIN TIME: 12.2 s (ref 11.4–15.2)

## 2017-12-08 LAB — BASIC METABOLIC PANEL
Anion gap: 11 (ref 5–15)
BUN: 10 mg/dL (ref 6–20)
CALCIUM: 9 mg/dL (ref 8.9–10.3)
CO2: 22 mmol/L (ref 22–32)
Chloride: 104 mmol/L (ref 101–111)
Creatinine, Ser: 0.86 mg/dL (ref 0.61–1.24)
GLUCOSE: 79 mg/dL (ref 65–99)
Potassium: 4.3 mmol/L (ref 3.5–5.1)
Sodium: 137 mmol/L (ref 135–145)

## 2017-12-08 LAB — TROPONIN I: TROPONIN I: 0.03 ng/mL — AB (ref <0.03)
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
## 2017-12-08 NOTE — ED Triage Notes (Signed)
Pt was at home and felt weakness and chest pain to his left side. Pt was here a month ago and had a similar event. Pt has an implanted defibrillator ([employer]) Pt states now he feels normal and is having no pain at present. He is on blood thinners. Pt is knowledgeable of his medical conditions
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
## 2017-12-09 ENCOUNTER — Observation Stay: Payer: Medicare Other

## 2017-12-09 ENCOUNTER — Other Ambulatory Visit: Payer: Self-pay

## 2017-12-09 DIAGNOSIS — G459 Transient cerebral ischemic attack, unspecified: Secondary | ICD-10-CM | POA: Diagnosis not present

## 2017-12-09 DIAGNOSIS — R200 Anesthesia of skin: Secondary | ICD-10-CM

## 2017-12-09 LAB — CBC
HEMATOCRIT: 43.9 % (ref 40.0–52.0)
Hemoglobin: 15.3 g/dL (ref 13.0–18.0)
MCH: 34.9 pg — AB (ref 26.0–34.0)
MCHC: 35 g/dL (ref 32.0–36.0)
MCV: 99.7 fL (ref 80.0–100.0)
Platelets: 252 10*3/uL (ref 150–440)
RBC: 4.4 MIL/uL (ref 4.40–5.90)
RDW: 12.4 % (ref 11.5–14.5)
WBC: 6.9 10*3/uL (ref 3.8–10.6)

## 2017-12-09 LAB — GLUCOSE, CAPILLARY: Glucose-Capillary: 79 mg/dL (ref 65–99)

## 2017-12-09 LAB — FOLATE: Folate: 11.8 ng/mL (ref >5.9)

## 2017-12-09 LAB — POCT CBG MONITORING: POCT Glucose (KUC): 79 mg/dL (ref 70–99)

## 2017-12-09 LAB — BASIC METABOLIC PANEL
Anion gap: 10 (ref 5–15)
BUN: 9 mg/dL (ref 6–20)
CO2: 21 mmol/L — AB (ref 22–32)
Calcium: 9.1 mg/dL (ref 8.9–10.3)
Chloride: 108 mmol/L (ref 101–111)
Creatinine, Ser: 0.82 mg/dL (ref 0.61–1.24)
GFR calc Af Amer: >60 mL/min (ref >60)
GLUCOSE: 75 mg/dL (ref 65–99)
POTASSIUM: 4.3 mmol/L (ref 3.5–5.1)
Sodium: 139 mmol/L (ref 135–145)

## 2017-12-09 LAB — VITAMIN B12: Vitamin B-12: 176 pg/mL — ABNORMAL LOW (ref 180–914)

## 2017-12-09 LAB — TROPONIN I: Troponin I: 0.03 ng/mL (ref <0.03)

## 2017-12-09 LAB — MAGNESIUM: Magnesium: 2.1 mg/dL (ref 1.7–2.4)

## 2017-12-09 MED ORDER — ATORVASTATIN CALCIUM 20 MG PO TABS
80.0000 mg | ORAL_TABLET | Freq: Every day | ORAL | Status: DC
  Administered 2017-12-09: 80 mg via ORAL
  Filled 2017-12-09: qty 4

## 2017-12-09 MED ORDER — RIVAROXABAN 20 MG PO TABS
20.0000 mg | ORAL_TABLET | Freq: Every day | ORAL | Status: DC
  Administered 2017-12-09: 17:00:00 20 mg via ORAL
  Filled 2017-12-09 (×2): qty 1

## 2017-12-09 MED ORDER — ONDANSETRON HCL 4 MG/2ML IJ SOLN: 4.0000 mg | Freq: Four times a day (QID) | INTRAMUSCULAR | Status: DC | PRN

## 2017-12-09 MED ORDER — HYDROCODONE-ACETAMINOPHEN 5-325 MG PO TABS: 1.0000 | ORAL_TABLET | ORAL | Status: DC | PRN

## 2017-12-09 MED ORDER — NITROGLYCERIN 0.4 MG/SPRAY TL SOLN: 1.0000 | Status: DC | PRN

## 2017-12-09 MED ORDER — ACETAMINOPHEN 325 MG PO TABS: 650.0000 mg | ORAL_TABLET | Freq: Four times a day (QID) | ORAL | Status: DC | PRN

## 2017-12-09 MED ORDER — CARVEDILOL 3.125 MG PO TABS
6.2500 mg | ORAL_TABLET | Freq: Two times a day (BID) | ORAL | Status: DC
  Administered 2017-12-09 – 2017-12-10 (×3): 6.25 mg via ORAL
  Filled 2017-12-09 (×3): qty 2

## 2017-12-09 MED ORDER — VITAMIN B-1 100 MG PO TABS
100.0000 mg | ORAL_TABLET | Freq: Every day | ORAL | Status: DC
  Administered 2017-12-09 – 2017-12-10 (×2): 100 mg via ORAL
  Filled 2017-12-09 (×2): qty 1

## 2017-12-09 MED ORDER — SODIUM CHLORIDE 0.9 % IV SOLN
Freq: Once | INTRAVENOUS | Status: AC
  Administered 2017-12-09: 01:00:00 via INTRAVENOUS

## 2017-12-09 MED ORDER — BISACODYL 5 MG PO TBEC: 5.0000 mg | DELAYED_RELEASE_TABLET | Freq: Every day | ORAL | Status: DC | PRN

## 2017-12-09 MED ORDER — FUROSEMIDE 20 MG PO TABS
20.0000 mg | ORAL_TABLET | Freq: Every day | ORAL | Status: DC
  Administered 2017-12-09 – 2017-12-10 (×2): 20 mg via ORAL
  Filled 2017-12-09 (×2): qty 1

## 2017-12-09 MED ORDER — DOCUSATE SODIUM 100 MG PO CAPS
100.0000 mg | ORAL_CAPSULE | Freq: Two times a day (BID) | ORAL | Status: DC
  Filled 2017-12-09 (×3): qty 1

## 2017-12-09 MED ORDER — SPIRONOLACTONE 25 MG PO TABS
12.5000 mg | ORAL_TABLET | Freq: Every day | ORAL | Status: DC
  Administered 2017-12-09 – 2017-12-10 (×2): 12.5 mg via ORAL
  Filled 2017-12-09: qty 1
  Filled 2017-12-09 (×2): qty 0.5
  Filled 2017-12-09: qty 1

## 2017-12-09 MED ORDER — ACETAMINOPHEN 650 MG RE SUPP: 650.0000 mg | Freq: Four times a day (QID) | RECTAL | Status: DC | PRN

## 2017-12-09 MED ORDER — LORATADINE 10 MG PO TABS: 10.0000 mg | ORAL_TABLET | Freq: Every day | ORAL | Status: DC | PRN

## 2017-12-09 MED ORDER — ONDANSETRON HCL 4 MG PO TABS: 4.0000 mg | ORAL_TABLET | Freq: Four times a day (QID) | ORAL | Status: DC | PRN

## 2017-12-09 MED ORDER — STROKE: EARLY STAGES OF RECOVERY BOOK
Freq: Once | Status: AC
  Administered 2017-12-09: 01:00:00

## 2017-12-09 MED ORDER — SACUBITRIL-VALSARTAN 24-26 MG PO TABS
1.0000 | ORAL_TABLET | Freq: Two times a day (BID) | ORAL | Status: DC
  Filled 2017-12-09: qty 1

## 2017-12-09 MED ORDER — NITROGLYCERIN 0.4 MG SL SUBL: 0.4000 mg | SUBLINGUAL_TABLET | SUBLINGUAL | Status: DC | PRN

## 2017-12-09 MED ORDER — ASPIRIN EC 325 MG PO TBEC
325.0000 mg | DELAYED_RELEASE_TABLET | Freq: Every day | ORAL | Status: DC
  Administered 2017-12-09 – 2017-12-10 (×2): 325 mg via ORAL
  Filled 2017-12-09 (×2): qty 1

## 2017-12-09 MED ORDER — SACUBITRIL-VALSARTAN 24-26 MG PO TABS
1.0000 | ORAL_TABLET | Freq: Two times a day (BID) | ORAL | Status: DC
  Administered 2017-12-09 – 2017-12-10 (×3): 1 via ORAL
  Filled 2017-12-09 (×5): qty 1

## 2017-12-09 NOTE — Care Management Obs Status (Signed)
MEDICARE OBSERVATION STATUS NOTIFICATION   Patient Details  Name: Logan [name] MRN: [id number] Date of Birth: 1975/08/03   Medicare Observation Status Notification Given:  Yes    Shaylynn Nulty A Xylia Scherger, RN 12/09/2017, 8:12 AM
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
## 2017-12-09 NOTE — Progress Notes (Signed)
[facility] [place] [facility]   PATIENT NAME: Logan [name]    MR#:  [id number]  DATE OF BIRTH:  Dec 14, 1975  SUBJECTIVE:42 year old male patient with history of CAD, CABG. came in because of left arm numbness.  Patient also had speech trouble but all other symptoms resolved.  Patient had a history of TIA 5 weeks ago.  CHIEF COMPLAINT:   Chief Complaint  Patient presents with  . Chest Pain    REVIEW OF SYSTEMS:    Review of Systems  Constitutional: Negative for chills and fever.  HENT: Negative for hearing loss.   Eyes: Negative for blurred vision, double vision and photophobia.  Respiratory: Negative for cough, hemoptysis and shortness of breath.   Cardiovascular: Negative for palpitations, orthopnea and leg swelling.  Gastrointestinal: Negative for abdominal pain, diarrhea and vomiting.  Genitourinary: Negative for dysuria and urgency.  Musculoskeletal: Negative for myalgias and neck pain.  Skin: Negative for rash.  Neurological: Negative for dizziness, focal weakness, seizures, weakness and headaches.  Psychiatric/Behavioral: Negative for memory loss. The patient does not have insomnia.     Nutrition:  Tolerating Diet: Tolerating PT:      DRUG ALLERGIES:   Allergies  Allergen Reactions  . Adhesive [Tape] Rash    "paper tape is ok" (07/09/2012)  . Bee Venom Anaphylaxis  . Penicillins Rash    Has patient had a PCN reaction causing immediate rash, facial/tongue/throat swelling, SOB or lightheadedness with hypotension: Yes Has patient had a PCN reaction causing severe rash involving mucus membranes or skin necrosis: No Has patient had a PCN reaction that required hospitalization No Has patient had a PCN reaction occurring within the last 10 years: No If all of the above answers are "NO", then may proceed with Cephalosporin use.     VITALS:  Blood pressure 103/60, pulse (!) 57, temperature 97.8 F (36.6 C), temperature source Oral,  resp. rate 14, height 5\' 8"  (1.727 m), weight 66.9 kg (147 lb 6.4 oz), SpO2 98 %.  PHYSICAL EXAMINATION:   Physical Exam  GENERAL:  42 y.o.-year-old patient lying in the bed with no acute distress.  EYES: Pupils equal, round, reactive to light and accommodation. No scleral icterus. Extraocular muscles intact.  HEENT: Head atraumatic, normocephalic. Oropharynx and nasopharynx clear.  NECK:  Supple, no jugular venous distention. No thyroid enlargement, no tenderness.  LUNGS: Normal breath sounds bilaterally, no wheezing, rales,rhonchi or crepitation. No use of accessory muscles of respiration.  CARDIOVASCULAR: S1, S2 normal. No murmurs, rubs, or gallops.  ABDOMEN: Soft, nontender, nondistended. Bowel sounds present. No organomegaly or mass.  EXTREMITIES: No pedal edema, cyanosis, or clubbing.  NEUROLOGIC: Cranial nerves II through XII are intact. Muscle strength 5/5 in all extremities. Sensation intact. Gait not checked.  PSYCHIATRIC: The patient is alert and oriented x 3.  SKIN: No obvious rash, lesion, or ulcer.    LABORATORY PANEL:   CBC Recent Labs  Lab 12/09/17 0105  WBC 6.9  HGB 15.3  HCT 43.9  PLT 252   ------------------------------------------------------------------------------------------------------------------  Chemistries  Recent Labs  Lab 12/09/17 0105  NA 139  K 4.3  CL 108  CO2 21*  GLUCOSE 75  BUN 9  CREATININE 0.82  CALCIUM 9.1   ------------------------------------------------------------------------------------------------------------------  Cardiac Enzymes Recent Labs  Lab 12/09/17 0105  TROPONINI 0.03*   ------------------------------------------------------------------------------------------------------------------  RADIOLOGY:  Dg Chest 2 View  Result Date: 12/08/2017 CLINICAL DATA:  Weakness, LEFT chest pain. Similar symptoms 1 month ago. History of CHF. EXAM: CHEST - 2 VIEW  COMPARISON:  Chest radiograph March 04, 2017 FINDINGS: The  cardiac silhouette is mildly enlarged, improved. Status post median sternotomy for CABG. Single lead LEFT AICD. No pleural effusion or focal consolidation. No pneumothorax. Soft tissue planes and included osseous structures are normal. IMPRESSION: Mild cardiomegaly.  No acute pulmonary process. Electronically Signed   By: Awilda Metro M.D.   On: 12/08/2017 20:24     ASSESSMENT AND PLAN:   Active Problems:   TIA (transient ischemic attack)   42 year old male patient with history of CAD, CABG, systolic CHF, ischemic cardiomyopathy with EF of 20%, frequent TIAs most recent from 5 weeks ago came in because of left arm numbness, speech troubles all of the symptoms resolved now.  Initial CT head unremarkable.  Patient admitted for evaluation of TIA. 1.  Possible TIA: MRI of the brain, carotid ultrasound ordered, neurology recommended this test.  Patient already on Xarelto.  If the carotid ultrasound, MRI of the brain negative for any abnormality likely discharge later today. #2 history of CAD, CABG: Patient on aspirin 325 mg p.o. daily, atorvastatin 80 mg p.o. daily, Coreg 6.25 mg p.o. twice daily, Xarelto 20 mg p.o. daily. 3.  History of ischemic cardiomyopathy, chronic systolic heart failure with EF 28%.  Patient is on Entresto. #4 history of V. tach: Asymptomatic.  Monitor closely, add magnesium to the labs.  All the records are reviewed and case discussed with Care Management/Social Workerr. Management plans discussed with the patient, family and they are in agreement.  CODE STATUS: full  TOTAL TIME TAKING CARE OF THIS PATIENT: 35 minutes.   POSSIBLE D/C IN 1-2DAYS, DEPENDING ON CLINICAL CONDITION.   Katha Hamming M.D on 12/09/2017 at 11:56 AM  Between 7am to 6pm - Pager - [phone]  After 6pm go to [email or web address] - password [email or web address] [facility]  [facility]  Office  [phone]  CC: Primary care physician; [name], MD

## 2017-12-09 NOTE — Progress Notes (Signed)
Likely discharge later today the carotid ultrasound is within normal limits, also followed by Dr. Loretha Brasil from neurology.  Medications reviewed, discussed with patient.

## 2017-12-09 NOTE — Care Management Note (Signed)
Case Management Note  Patient Details  Name: Logan [name] MRN: [id number] Date of Birth: [birth year]  Subjective/Objective:   Patient admitted to [facility] under observation status for TIA. Patient lives a home with multiple cousins and nephews, per the patient this is the best number to contact him or family [phone])  [phone]. Patient is able to complete all of his activities of daily living independently and only uses a cane "occasionaly". No transportation limitations. Patient has an initial set up appointment coming up with [facility] to establish a PCP. Uses [facility] on [address] And is able to obtain medications without issues.                 Action/Plan:  No anticipated RNCM needs but will continue to follow.  Expected Discharge Date:                  Expected Discharge Plan:     In-House Referral:     Discharge planning Services     Post Acute Care Choice:    Choice offered to:     DME Arranged:    DME Agency:     HH Arranged:    HH Agency:     Status of Service:     If discussed at [employer], dates discussed:    Additional Comments:  Virgel Manifold, RN 12/09/2017, 8:19 AM

## 2017-12-10 DIAGNOSIS — G459 Transient cerebral ischemic attack, unspecified: Secondary | ICD-10-CM | POA: Diagnosis not present

## 2017-12-10 DIAGNOSIS — R200 Anesthesia of skin: Secondary | ICD-10-CM | POA: Diagnosis not present

## 2017-12-10 LAB — GLUCOSE, CAPILLARY: GLUCOSE-CAPILLARY: 90 mg/dL (ref 65–99)

## 2017-12-10 NOTE — Progress Notes (Signed)
MD order received in [facility] to discharge pt home today; verbally reviewed AVS with pt, no new Rxs; no questions voiced at this time; pt's discharge pending arrival of a ride home, pt currently trying to get a hold of someone to come and pick him up; verbally instructed pt to let staff know when his ride is here
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
## 2017-12-10 NOTE — Progress Notes (Signed)
Pt's ride here to take him home; pt discharged via wheelchair by auxillary to the visitor's entrance

## 2018-01-25 ENCOUNTER — Encounter (HOSPITAL_COMMUNITY): Payer: Medicare Other | Admitting: Internal Medicine

## 2018-03-30 IMAGING — CT CT HEAD W/O CM
4 series · 16 of 47 positions shown, 18 images · non-contrast
Comparison: 03/03/2017

CLINICAL DATA: Post stroke intervention

EXAM:
CT HEAD WITHOUT CONTRAST
TECHNIQUE: Contiguous axial images were obtained from the base of the skull
through the vertex without intravenous contrast.

[Series 3: head without · axial · non-contrast · 0.44mm/px · z∈[-137,-17]mm · 7 of 34 slices shown, 9 images]
[im 5/34  brain]
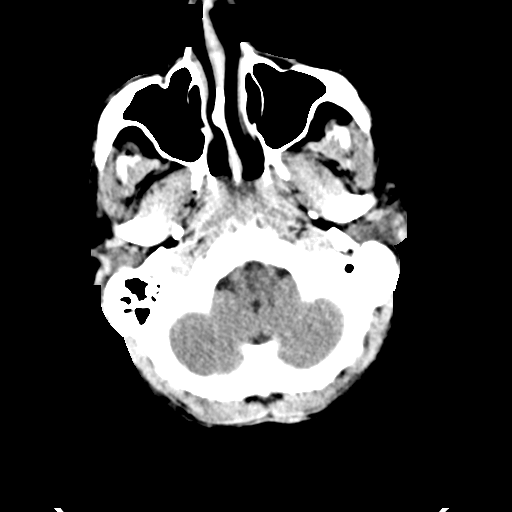
[im 5/34  bone]
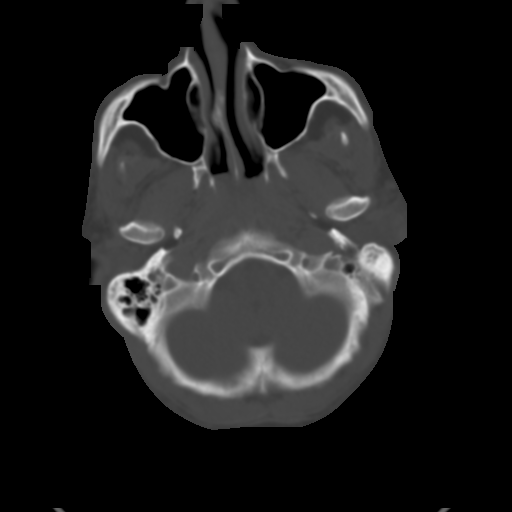
[im 9/34  brain]
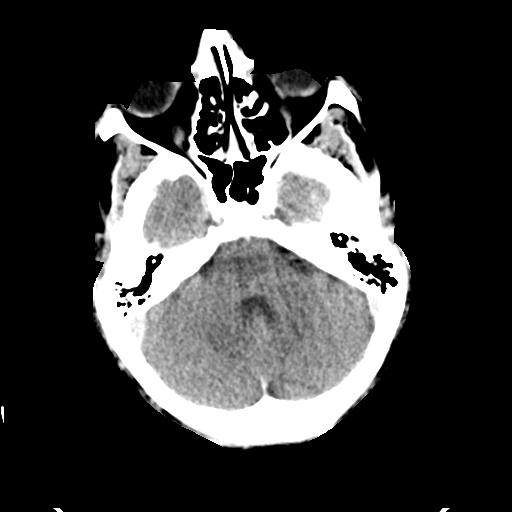
[im 13/34  brain]
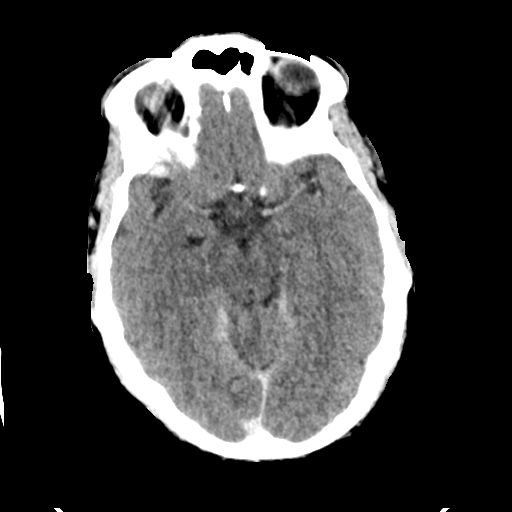
[im 17/34  brain]
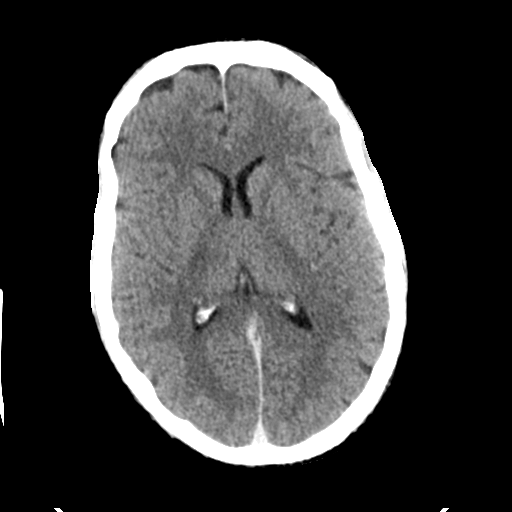
[im 21/34  brain]
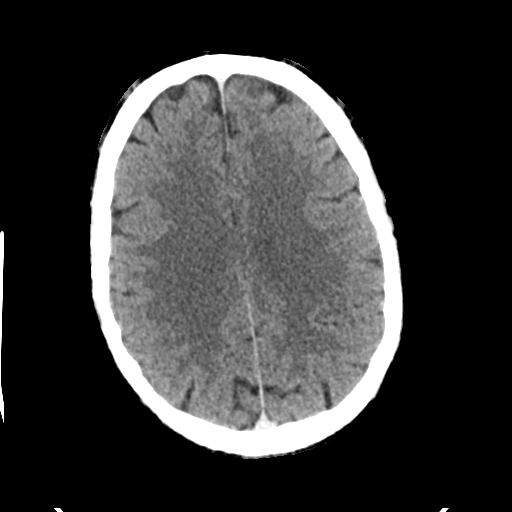
[im 21/34  bone]
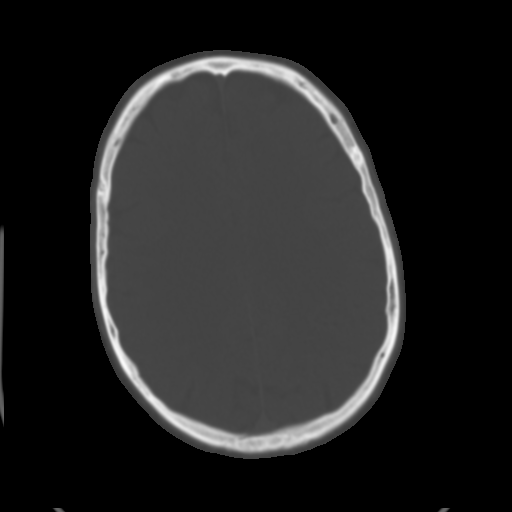
[im 25/34  brain]
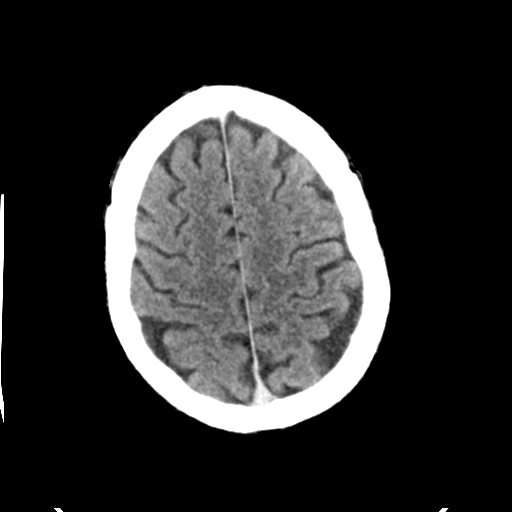
[im 29/34  brain]
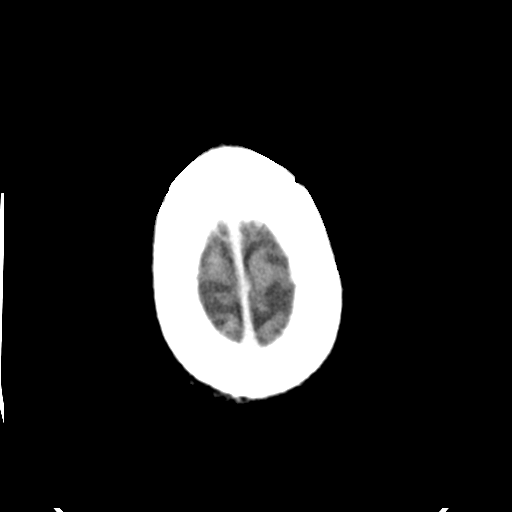

[Series 4: head bone · axial · 0.44mm/px · z∈[-141,-109]mm · 3 of 84 slices shown]
[im 9/84  bone]
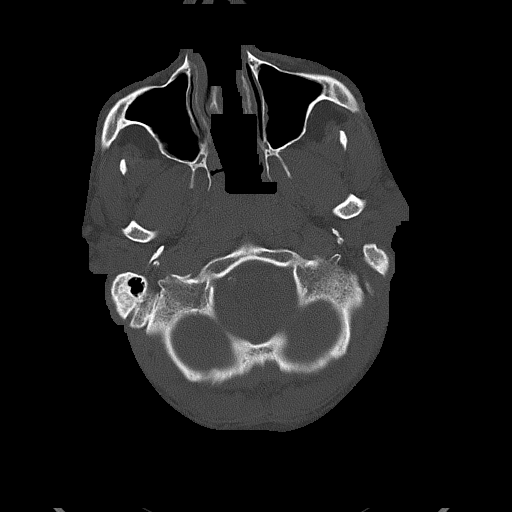
[im 17/84  bone]
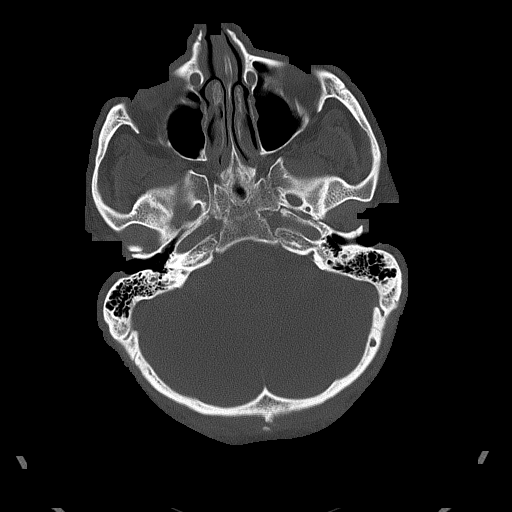
[im 25/84  bone]
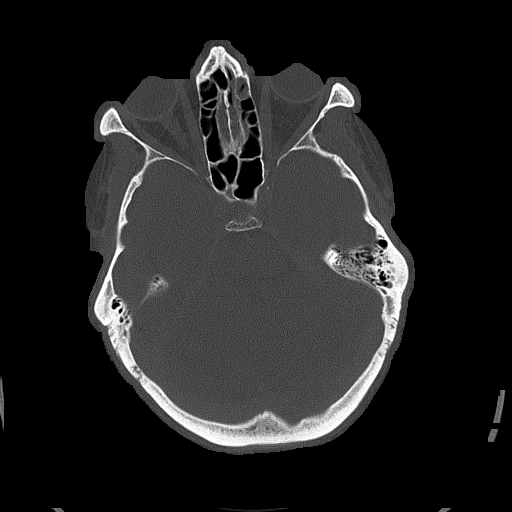

[Series 5: head without cor · coronal · non-contrast · 0.32mm/px · 3 of 69 slices shown]
[im 23/69  brain]
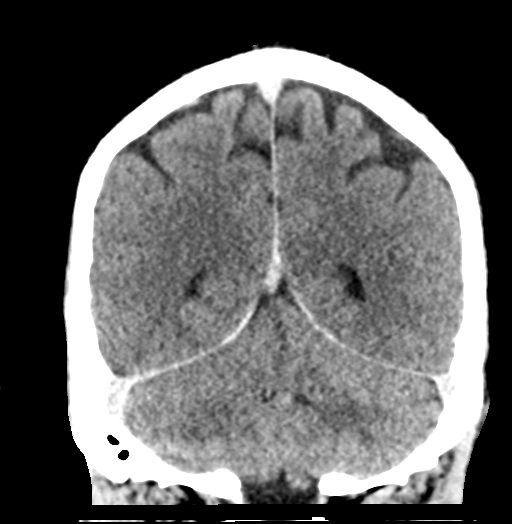
[im 31/69  brain]
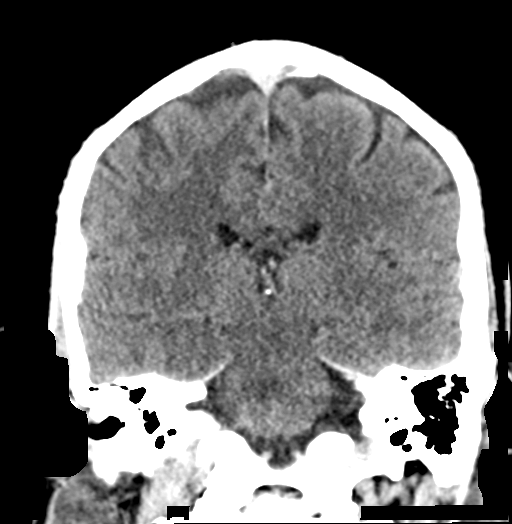
[im 38/69  brain]
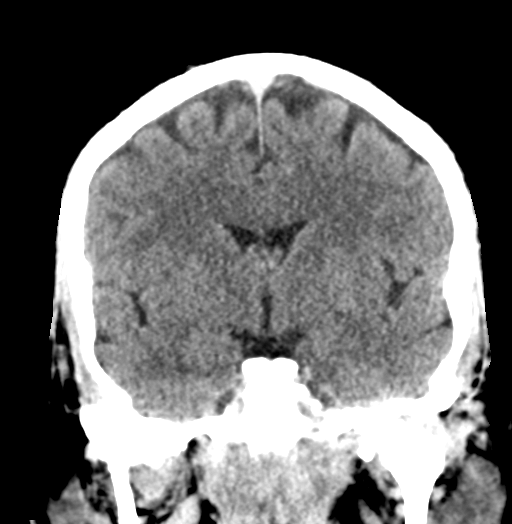

[Series 6: head without sag · sagittal · non-contrast · 0.33mm/px · 3 of 65 slices shown]
[im 22/65  brain]
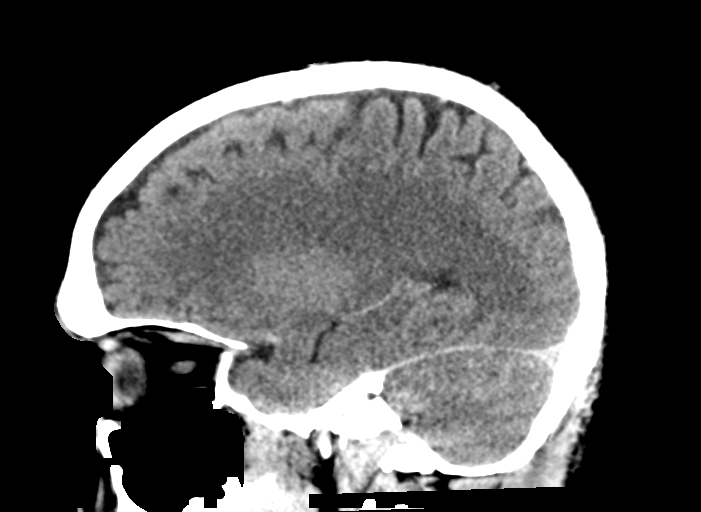
[im 33/65  brain]
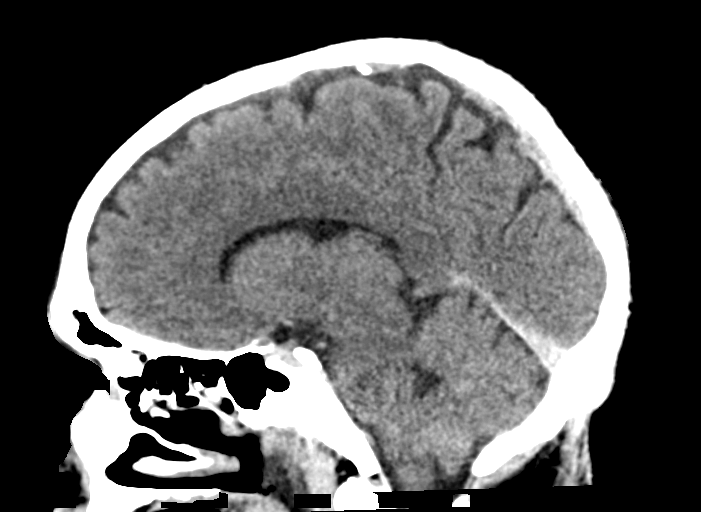
[im 43/65  brain]
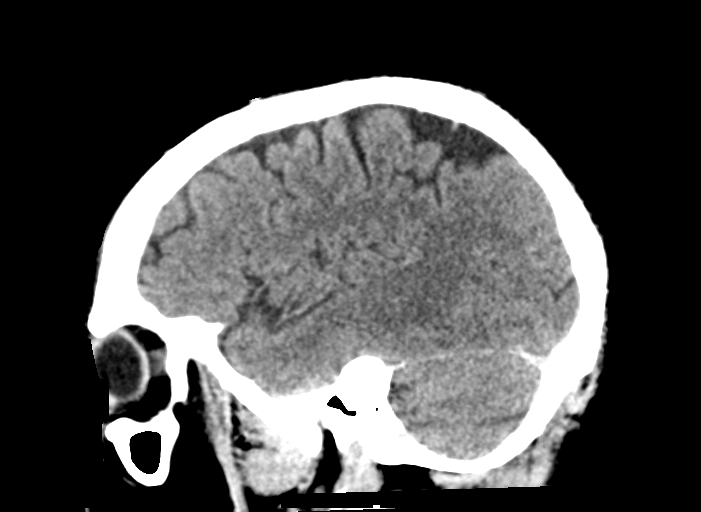

[16 of 47 positions shown; findings below may reference images not displayed]

FINDINGS: Brain: No hemorrhage. No visible occipital infarct. The right
superior cerebellar infarct is obscured postcontrast.

Vascular: No asymmetry vessel density.

Skull: Negative.

Sinuses/Orbits: Intubation with nasopharyngeal fluid. No acute
finding
IMPRESSION: 1. No acute finding after right PCA revascularization.
2. A right superior cerebellar infarct is obscured due to
enhancement. No visible occipital infarct.

## 2018-03-31 IMAGING — DX DG CHEST 1V PORT
2 series · 2 of 2 positions shown · non-contrast
Comparison: Chest x-ray 03/03/2017 and 02/05/2017.

CLINICAL DATA: Respiratory failure.

EXAM:
PORTABLE CHEST 1 VIEW

[chest ap (1 of 2)]
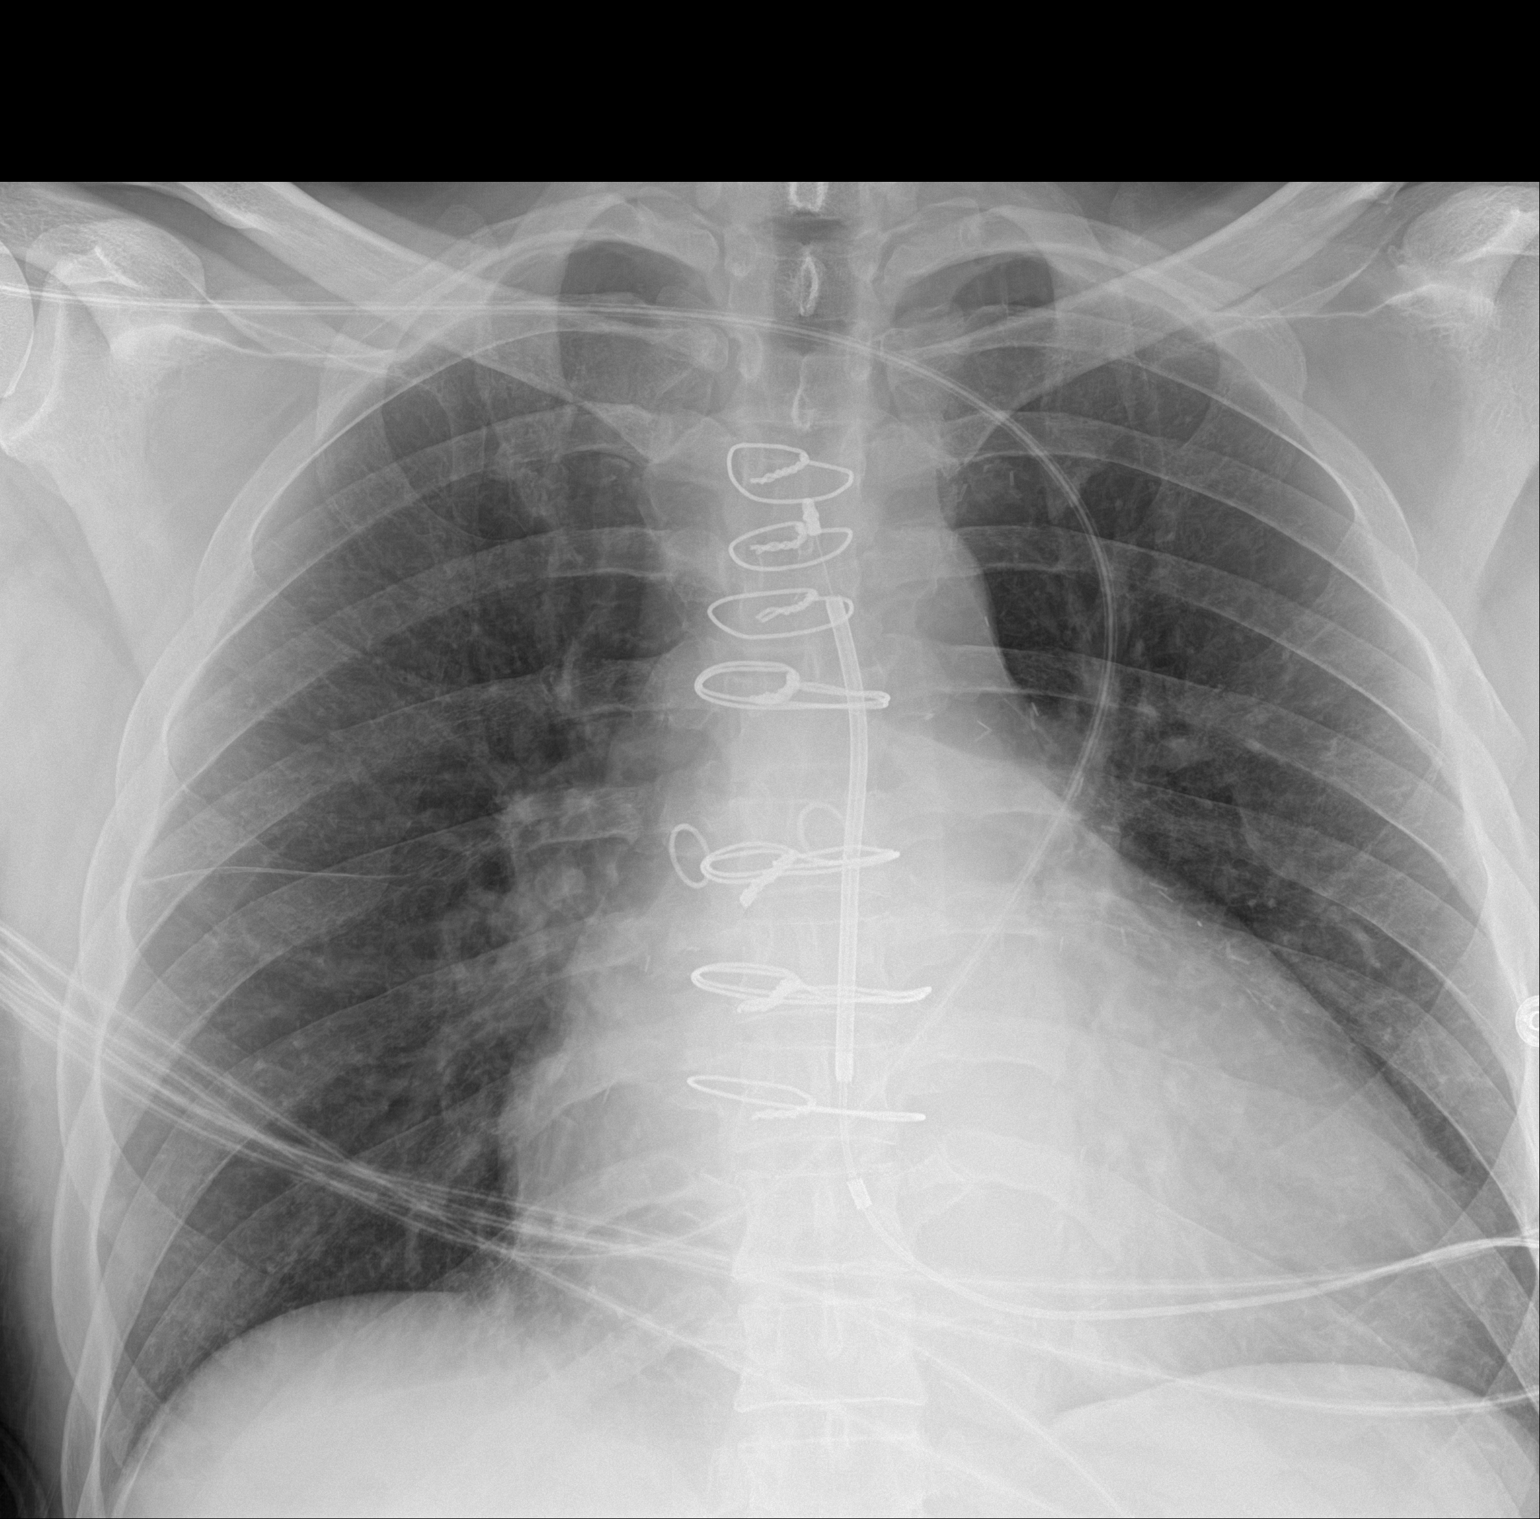

[chest ap (2 of 2)]
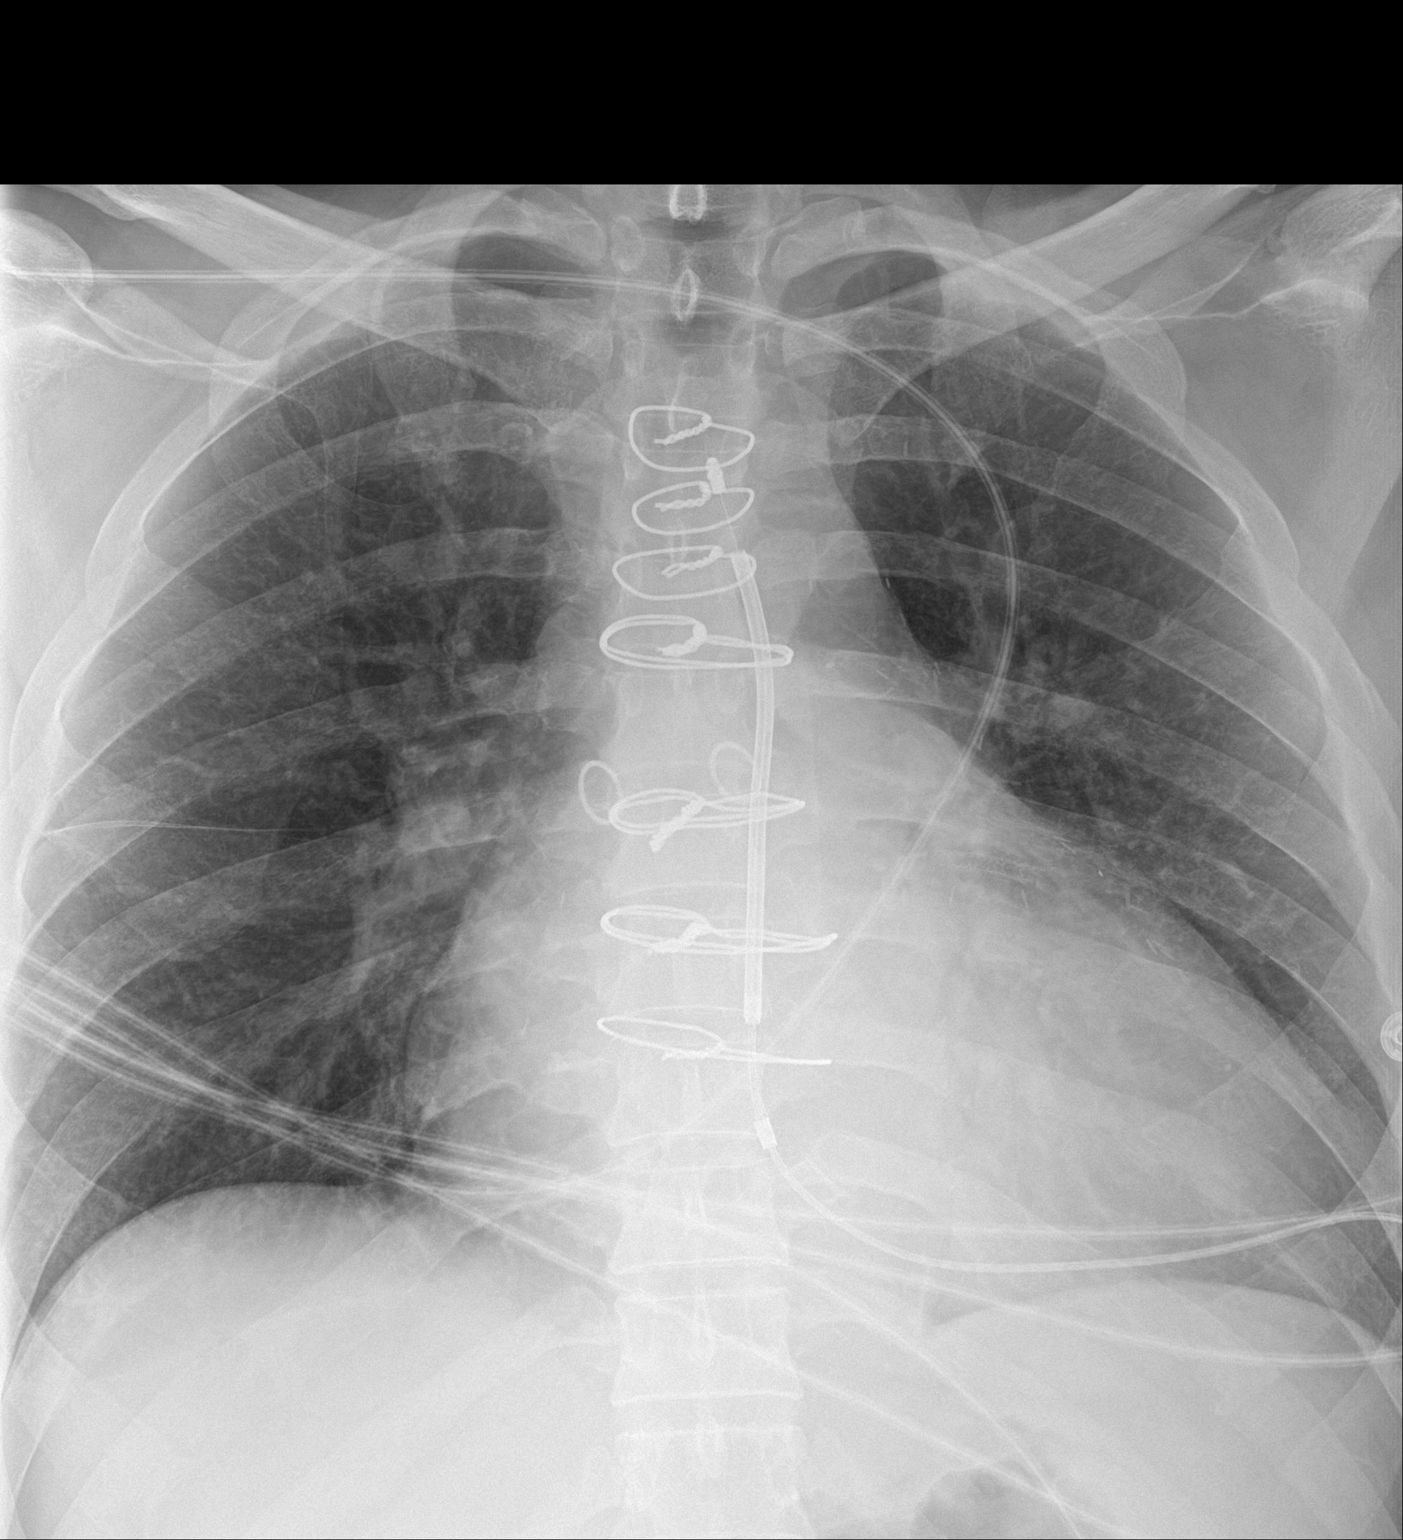

[2 of 2 positions shown; findings below may reference images not displayed]

FINDINGS: Cardiac enlargement is again noted. There is no edema or effusion.
Median sternotomy for CABG is noted. Superficial AICD is stable.
IMPRESSION: 1. Stable cardiomegaly without failure.
2. No acute cardiopulmonary disease.

## 2018-11-27 ENCOUNTER — Telehealth: Payer: Self-pay

## 2018-11-27 NOTE — Telephone Encounter (Signed)
I called and left patient a message about setting up telehealth visit. Patient is on [facility]'s recall list and Dr. Graciela Husbands has several openings tomorrow.

## 2018-12-07 NOTE — Telephone Encounter (Signed)
Patient did not call back and schedule follow up appointment. 

## 2019-03-25 ENCOUNTER — Other Ambulatory Visit: Payer: Self-pay

## 2019-03-25 ENCOUNTER — Encounter (HOSPITAL_COMMUNITY): Payer: Self-pay | Admitting: Internal Medicine

## 2019-03-25 ENCOUNTER — Ambulatory Visit (HOSPITAL_COMMUNITY)
Admission: RE | Admit: 2019-03-25 | Discharge: 2019-03-25 | Disposition: A | Discharge status: Home / Self Care | Payer: Medicare Other | Source: Ambulatory Visit | Attending: Internal Medicine | Admitting: Internal Medicine

## 2019-03-25 VITALS — BP 118/90 | HR 106 | Wt 151.4 lb

## 2019-03-25 DIAGNOSIS — I480 Paroxysmal atrial fibrillation: Secondary | ICD-10-CM | POA: Insufficient documentation

## 2019-03-25 DIAGNOSIS — Z8673 Personal history of transient ischemic attack (TIA), and cerebral infarction without residual deficits: Secondary | ICD-10-CM | POA: Insufficient documentation

## 2019-03-25 DIAGNOSIS — I447 Left bundle-branch block, unspecified: Secondary | ICD-10-CM | POA: Insufficient documentation

## 2019-03-25 DIAGNOSIS — Z79899 Other long term (current) drug therapy: Secondary | ICD-10-CM | POA: Insufficient documentation

## 2019-03-25 DIAGNOSIS — Z8249 Family history of ischemic heart disease and other diseases of the circulatory system: Secondary | ICD-10-CM | POA: Insufficient documentation

## 2019-03-25 DIAGNOSIS — I25119 Atherosclerotic heart disease of native coronary artery with unspecified angina pectoris: Secondary | ICD-10-CM | POA: Diagnosis not present

## 2019-03-25 DIAGNOSIS — I2510 Atherosclerotic heart disease of native coronary artery without angina pectoris: Secondary | ICD-10-CM | POA: Diagnosis not present

## 2019-03-25 DIAGNOSIS — I252 Old myocardial infarction: Secondary | ICD-10-CM | POA: Insufficient documentation

## 2019-03-25 DIAGNOSIS — Z951 Presence of aortocoronary bypass graft: Secondary | ICD-10-CM | POA: Insufficient documentation

## 2019-03-25 DIAGNOSIS — I255 Ischemic cardiomyopathy: Secondary | ICD-10-CM | POA: Diagnosis not present

## 2019-03-25 DIAGNOSIS — Z7982 Long term (current) use of aspirin: Secondary | ICD-10-CM | POA: Insufficient documentation

## 2019-03-25 DIAGNOSIS — I340 Nonrheumatic mitral (valve) insufficiency: Secondary | ICD-10-CM | POA: Insufficient documentation

## 2019-03-25 DIAGNOSIS — I513 Intracardiac thrombosis, not elsewhere classified: Secondary | ICD-10-CM | POA: Diagnosis not present

## 2019-03-25 DIAGNOSIS — K219 Gastro-esophageal reflux disease without esophagitis: Secondary | ICD-10-CM | POA: Insufficient documentation

## 2019-03-25 DIAGNOSIS — G459 Transient cerebral ischemic attack, unspecified: Secondary | ICD-10-CM | POA: Diagnosis not present

## 2019-03-25 DIAGNOSIS — F17210 Nicotine dependence, cigarettes, uncomplicated: Secondary | ICD-10-CM | POA: Insufficient documentation

## 2019-03-25 DIAGNOSIS — Z95810 Presence of automatic (implantable) cardiac defibrillator: Secondary | ICD-10-CM

## 2019-03-25 DIAGNOSIS — E785 Hyperlipidemia, unspecified: Secondary | ICD-10-CM | POA: Diagnosis not present

## 2019-03-25 DIAGNOSIS — Z7901 Long term (current) use of anticoagulants: Secondary | ICD-10-CM | POA: Diagnosis not present

## 2019-03-25 DIAGNOSIS — I5022 Chronic systolic (congestive) heart failure: Secondary | ICD-10-CM | POA: Insufficient documentation

## 2019-03-25 LAB — COMPREHENSIVE METABOLIC PANEL
ALT: 20 U/L (ref 0–44)
AST: 25 U/L (ref 15–41)
Albumin: 3.9 g/dL (ref 3.5–5.0)
Alkaline Phosphatase: 80 U/L (ref 38–126)
Anion gap: 9 (ref 5–15)
BUN: 7 mg/dL (ref 6–20)
CO2: 21 mmol/L — ABNORMAL LOW (ref 22–32)
Calcium: 9.3 mg/dL (ref 8.9–10.3)
Chloride: 108 mmol/L (ref 98–111)
Creatinine, Ser: 0.86 mg/dL (ref 0.61–1.24)
GFR calc Af Amer: >60 mL/min (ref >60)
GFR calc non Af Amer: >60 mL/min (ref >60)
Glucose, Bld: 93 mg/dL (ref 70–99)
Potassium: 4.2 mmol/L (ref 3.5–5.1)
Sodium: 138 mmol/L (ref 135–145)
Total Bilirubin: 1 mg/dL (ref 0.3–1.2)
Total Protein: 7 g/dL (ref 6.5–8.1)

## 2019-03-25 LAB — CBC
HCT: 41.7 % (ref 39.0–52.0)
Hemoglobin: 14.3 g/dL (ref 13.0–17.0)
MCH: 35 pg — ABNORMAL HIGH (ref 26.0–34.0)
MCHC: 34.3 g/dL (ref 30.0–36.0)
MCV: 102 fL — ABNORMAL HIGH (ref 80.0–100.0)
Platelets: 234 10*3/uL (ref 150–400)
RBC: 4.09 MIL/uL — ABNORMAL LOW (ref 4.22–5.81)
RDW: 12.6 % (ref 11.5–15.5)
WBC: 6.6 10*3/uL (ref 4.0–10.5)
nRBC: 0 % (ref 0.0–0.2)

## 2019-03-25 LAB — BRAIN NATRIURETIC PEPTIDE: B Natriuretic Peptide: 771.1 pg/mL — ABNORMAL HIGH (ref 0.0–100.0)

## 2019-03-25 MED ORDER — LOSARTAN POTASSIUM 25 MG PO TABS: 25.0000 mg | ORAL_TABLET | Freq: Every day | ORAL | 3 refills | Status: DC

## 2019-03-25 MED ORDER — FUROSEMIDE 20 MG PO TABS: 20.0000 mg | ORAL_TABLET | Freq: Every day | ORAL | 3 refills | Status: DC

## 2019-03-25 MED ORDER — ATORVASTATIN CALCIUM 80 MG PO TABS: 80.0000 mg | ORAL_TABLET | Freq: Every day | ORAL | 3 refills | Status: AC

## 2019-03-25 MED ORDER — SPIRONOLACTONE 25 MG PO TABS: 12.5000 mg | ORAL_TABLET | Freq: Every day | ORAL | 3 refills | Status: DC

## 2019-03-25 MED ORDER — RIVAROXABAN 20 MG PO TABS: 20.0000 mg | ORAL_TABLET | Freq: Every day | ORAL | 6 refills | Status: DC

## 2019-03-25 NOTE — Patient Instructions (Addendum)
RESTART FOLLOWING MEDICATIONS:   Lasix 20mg  (1 tab) daily  Spironolactone 12.5mg  (1/2 tab) daily  Atorvastatin 80mg  (1 tab) daily  Losartan 25mg  (1 tab) daily  Xarelto 20mg  (1 tab) daily with supper  STOP ADVIL, can take tylenol for pain.  Labs today We will only contact you if something comes back abnormal or we need to make some changes. Otherwise no news is good news!  Your physician recommends that you schedule a follow-up appointment in: 2 weeks with Pharmacist and an ECHO.  You are to follow up with Dr Haroldine Laws in 2 months.   Your physician has requested that you have an echocardiogram. Echocardiography is a painless test that uses sound waves to create images of your heart. It provides your doctor with information about the size and shape of your heart and how well your heart's chambers and valves are working. This procedure takes approximately one hour. There are no restrictions for this procedure.   At the [facility], you and your health needs are our priority. As part of our continuing mission to provide you with exceptional heart care, we have created designated Provider Care Teams. These Care Teams include your primary Cardiologist (physician) and Advanced Practice Providers (APPs- Physician Assistants and Nurse Practitioners) who all work together to provide you with the care you need, when you need it.   You may see any of the following providers on your designated Care Team at your next follow up: Marland Kitchen Dr Glori Bickers . Dr Loralie Champagne . Darrick Grinder, NP   Please be sure to bring in all your medications bottles to every appointment.

## 2019-03-25 NOTE — Progress Notes (Addendum)
Patient ID: [name], male   DOB: 10/15/1975, 43 y.o.   MRN: [id number]   [facility] NEW PATIENT NOTE  Patient ID: [name], male   DOB: 12/06/1975, 43 y.o.   MRN: [id number] PCP: None   HPI:  Logan [name] is a 43 year old male with CAD s/p CABG x 3; LIMA-LAD, SVG -obtuse marginal, SVG-RCA (07/2012), ischemic cardiomyopathy- Hx of anterolateral MI with DES to LAD; reinfarction 02/05/12 while in [place] with thrombosis of the stent and repeat stenting. He also has a history of nicotine dependence, previous ETOH abuse, multiple CVAs, PAF and non-compliance. We have not seen him in Clinic since August 2017.   Admitted early December 2016 with CP. Had myoview extensive scar with minimal peri-infarct ischemia. Areas of scar correlated with findings on his cardiac MRI in 2014. Troponin remained mildly elevated but did not demonstrate typical rise and fall as expected with ACS - more consistent with significant cardiomyopathy. EF not  calculated. Later discharged home.   Admitted 7/5-01/07/16  with TIA symptoms in the setting of stopping Xarelto for several days (has had the same thing in the past). Improved spontaneously.  CT unremarkable.  No MRI with ICD (has a [employer] that is MRI compatible, but radiology refused per patient). Needs to make follow up with stroke clinic.   Admitted last in 6/19 with recurrent TIA?CVA with LUE numbness.   He is s/p [employer] S-ICD  Says he moved to [place] but things didn't work out with his ex-wife so moved back to [place] last year.   He is here to re-establish care. Says he is doing ok. Breathing is good as long as he doesn't "overdo is". Denies CP. Can do all ADLs and walk to mailbox without problem. Will push mow his yard for exercise and will get SOB if he goes to hard. No change. He is worried he has COPD "like my momma does". Was smoking 3ppd now only smokes a few cigs per week. Also cut back on drinking. He drinks 1 beer a week "just  to keep my kidneys clean." He was drinking 3 x 18 packs per week. Not taking any medicines in close to a year. + orhtopnea. Not taking any lasix   CPX Results 06/2012 Peak VO2: 21.9 ml/kg/min  predicted peak VO2: 52.9% VE/VCO2 slope: 33.7 OUES: 1.53 Peak RER: 1.31 Ventilatory Threshold: 17.4 % predicted peak VO2: 42%   L/RHC 06/2015  Prox RCA lesion, 100% stenosed.  Ost LM to LM lesion, 20% stenosed.  2nd Mrg lesion, 100% stenosed.  SVG was injected is large.  The graft exhibits minimal luminal irregularities.  Mid LAD lesion, 40% stenosed.  Dist LAD lesion, 40% stenosed.  LIMA is small.  Insertion lesion, 70% stenosed.  SVG is very large.  The graft exhibits minimal luminal irregularities.   Findings:  RA = 2 RV = 27/05 PA = 19/2 (11) PCW = 6 Ao = 109/69 (86) LV = 115/8/20 Fick cardiac output/index = 3.9/2.1 SVR = 1719 PVR = 1.3 WU FA sat = 96% PA sat = 62%, 64%  Assessment: 1. Severe native 3v CAD with severe diffuse premature atherosclerosis 2. LIMA - LAD is very small due to competitive flow in native LAD. There is a bout a 70% narrowing at the insertion with the LAD 3. SVG - RPDA widely patent 4. SVR - OM2 widely patent 5. Normal hemodynamics with low filling pressures  L/RHC 07/2012 (prior to CABG) RA = 4  RV = 35/2/6  PA = 30/14 (20)  PCW = 15 v = 25  Fick cardiac output/index = 3.7/2.1  PVR = 1.4 Woods  FA sat = 97%  PA sat = 62%, 65%   Assessment:  1. 3 vessel disease with totally occluded RCA, severe disease in LCx, and 80% in-stent restenosis in LAD.  2. Low cardiac output, elevated filling pressures.   ECHO 04/2013: EF 20% with mural thrombus, grade I diastolic dysfunction, moderate RV dilation, moderate MR.  Limited ECHO (7/15): moderate LV dilation, EF 10-15%, restrictive diastolic function, there was an apical thrombus noted, moderate functional MR, moderately decreased RV systolic function.   Labs (7/15): K 4.4, creatinine  0.87, HCT 42.6 Labs (8/15): LDL 158, HDL 56 Labs 05/2014: K 4.1 Creatinine 0.87   Review of Systems: [y] = yes, [ ]  = no    General: Weight gain [] ; Weight loss [ ] ; Anorexia [ ] ; Fatigue [y]; Fever [ ] ; Chills [ ] ; Weakness Cove.Etienne ]   Cardiac: Chest pain/pressure [ ] ; Resting SOB [ ] ; Exertional SOB [y]; Orthopnea Cove.Etienne ]; Pedal Edema [] ; Palpitations [ ] ; Syncope [ ] ; Presyncope [ ] ; Paroxysmal nocturnal dyspnea[ ]    Pulmonary: Cough [ ] ; Wheezing[ ] ; Hemoptysis[ ] ; Sputum [ ] ; Snoring [ ]    GI: Vomiting[ ] ; Dysphagia[ ] ; Melena[ ] ; Hematochezia [ ] ; Heartburn[ ] ; Abdominal pain [ ] ; Constipation [ ] ; Diarrhea [ ] ; BRBPR [ ]    GU: Hematuria[ ] ; Dysuria [ ] ; Nocturia[ ]   Vascular: Pain in legs with walking [ ] ; Pain in feet with lying flat [ ] ; Non-healing sores [ ] ; Stroke Cove.Etienne ]; TIA Cove.Etienne ]; Slurred speech [ ] ;   Neuro: Headaches[ ] ; Vertigo[ ] ; Seizures[ ] ; Paresthesias[ ] ;Blurred vision [ ] ; Diplopia [ ] ; Vision changes [ ]    Ortho/Skin: Arthritis [y]; Joint pain [y]; Muscle pain [ ] ; Joint swelling [ ] ; Back Pain [ ] ; Rash [ ]    Psych: Depression[ y]; Anxiety[y ]   Heme: Bleeding problems [ ] ; Clotting disorders [ ] ; Anemia [ ]    Endocrine: Diabetes [ ] ; Thyroid dysfunction[ ]    SH: Married, lives in [place], 2 kids, on disability, smokes 1 ppd, +ETOH  FH: Brother with MI at 35, grandmother MI at 78, mother SCD  ROS: All systems negative except as listed in HPI, PMH and Problem List.  Past Medical History:  Diagnosis Date  . Anginal pain (HCC)   . CAD (coronary artery disease) 01/22/12   a.anterolateral MI with DES to LAD EF 25% b. reinfarction 8/13 with thrombosis of stent c. s/p CABG x 3: LIMA-LAD, SVG-OM, SVG-RCA (07/2012)   . Chronic systolic CHF (congestive heart failure) (HCC)    a. (07/2012) EF 20-25% diff HK with reg wall abnl, HK anterolat wall, akinesis inferosep, mod MR;  b. 01/2014 Echo: EF 10-15%, glob HK, mod MR.  Marland Kitchen GERD (gastroesophageal reflux disease)    . Hyperlipidemia   . Ischemic cardiomyopathy    a.  01/2014 Echo: EF 10-15%, glob HK;  b. 03/2014 S/P Subcutaneous ICD.  . LV (left ventricular) mural thrombus    "just found it in Oct" (07/09/2012)  . Moderate mitral regurgitation   . Myocardial infarction (HCC) 01/2012; 02/2012; 04/2012  . Noncompliance   . Stroke [facility]) 04/2014   TIA in the setting of medical non-compliance with coumadin requiring hospitalization.  . Syncope   . Tobacco abuse     Current Outpatient Medications  Medication Sig Dispense Refill  . aspirin 325 MG tablet Take 1 tablet (  325 mg total) by mouth daily.    Marland Kitchen. atorvastatin (LIPITOR) 80 MG tablet Take 1 tablet (80 mg total) by mouth daily at 6 PM. (Patient taking differently: Take 80 mg by mouth daily. ) 30 tablet 0  . carvedilol (COREG) 6.25 MG tablet Take 1 tablet (6.25 mg total) by mouth 2 (two) times daily with a meal. 60 tablet 6  . furosemide (LASIX) 20 MG tablet Take 20 mg by mouth daily.    Marland Kitchen. ibuprofen (ADVIL,MOTRIN) 200 MG tablet Take 200 mg by mouth every 6 (six) hours as needed for headache.    . loratadine (CLARITIN) 10 MG tablet Take 10 mg by mouth daily as needed for allergies.    . nitroGLYCERIN (NITROLINGUAL) 0.4 MG/SPRAY spray Place 1 spray under the tongue every 5 (five) minutes x 3 doses as needed for chest pain. 12 g 12  . rivaroxaban (XARELTO) 20 MG TABS tablet Take 1 tablet (20 mg total) by mouth daily with supper. 30 tablet 3  . sacubitril-valsartan (ENTRESTO) 24-26 MG Take 1 tablet by mouth 2 (two) times daily. (Patient taking differently: Take 1 tablet by mouth daily. ) 60 tablet 6  . spironolactone (ALDACTONE) 25 MG tablet Take 12.5 mg by mouth daily.     No current facility-administered medications for this encounter.     Vitals:   03/25/19 0949  BP: 118/90  Pulse: (!) 106  SpO2: 99%  Weight: 68.7 kg (151 lb 6.4 oz)    PHYSICAL EXAM: General:  Walked into clinic with cane  No resp difficulty.  Smells like tobacco smoke.  HEENT:  normal Neck: supple. JVP 7. Carotids 2+ bilat; no bruits. No lymphadenopathy or thryomegaly appreciated. Cor: PMI nondisplaced. Regular tachy . +s3 2/6 MR Lungs: clear Abdomen: soft, nontender, nondistended. No hepatosplenomegaly. No bruits or masses. Good bowel sounds. Extremities: no cyanosis, clubbing, rash, edema Neuro: alert & orientedx3, cranial nerves grossly intact. moves all 4 extremities w/o difficulty x for RUE weakness Affect pleasant  ECG: ST 101 LBBB 174ms Personally reviewed   ASSESSMENT & PLAN:  1) Chronic systolic HF: Echo (11/15) with EF 10-15%. Ischemic cardiomyopathy, not much improvement in LV function post-CABG.  Has Subcutaneous [employer] S-ICD.  - NYHA class II-III symptoms in the setting of complete non-compliance -  Volume status mildly elevated - Repeat echo - Start losartan 25 (cannot afford Entresto) - Lasix 20mg  daily - Spiro 12.5mg  daily - Return to clinic to se PharmD in 2 weeks with labs. Ideally would like them to see him q2 weeks for med titration. F/u with me 2 months - ICD interrogated in clinic. No VT  4.5 years left on battery - Given LBBB would ideally also have CRT . Will refer back to EP clinic to re-establish care. Can decide on CRT down the road  2) Chest pain/CAD: s/p CABG x 3 in 1/14. - No s/s ischemia - LHC 06/2015 with severe native disease. Patent SVG (RPDA) and SVR (OM2).  LIMA is very small due to competitive flow in native LAD. There is a bout a 70% narrowing at the insertion with the LAD - Resume atorvastatin 80 mg daily and Xarelto. No need for ASA  3) Mural Thrombus:  Not able to handle coumadin. Continue to Xarelto for thrombus and PAF.  - Repeat echo. Resume Xarelto  4) ETOH abuse: - has cut way back  5) Current Smoker:  - Has cut back to  3 cigs/week - Encouraged to stop all together.    6) TIA/CVA-  05/2014 and 01/2016, 12/19  - We have restarted Xarelto and statin.  Encouraged him not to stop these again.   7)  PAF - In NSR. This patients CHA2DS2-VASc Score and unadjusted Ischemic Stroke Rate (% per year) is equal to 4.8 % stroke rate/year from a score of 4  Continue Xarelto.   8) LBBB  - see above  Quillian Quince Ailen Strauch,MD 10:00 AM

## 2019-04-08 ENCOUNTER — Other Ambulatory Visit: Payer: Self-pay

## 2019-04-08 ENCOUNTER — Ambulatory Visit (HOSPITAL_COMMUNITY)
Admission: RE | Admit: 2019-04-08 | Discharge: 2019-04-08 | Disposition: A | Discharge status: Home / Self Care | Payer: Medicare Other | Source: Ambulatory Visit | Attending: Internal Medicine | Admitting: Internal Medicine

## 2019-04-08 ENCOUNTER — Ambulatory Visit (HOSPITAL_COMMUNITY)
Admission: RE | Admit: 2019-04-08 | Discharge: 2019-04-08 | Disposition: A | Discharge status: Home / Self Care | Payer: Medicare Other | Source: Ambulatory Visit | Attending: Cardiology | Admitting: Cardiology

## 2019-04-08 VITALS — BP 114/70 | HR 74 | Wt 150.2 lb

## 2019-04-08 DIAGNOSIS — I447 Left bundle-branch block, unspecified: Secondary | ICD-10-CM | POA: Insufficient documentation

## 2019-04-08 DIAGNOSIS — I2510 Atherosclerotic heart disease of native coronary artery without angina pectoris: Secondary | ICD-10-CM | POA: Diagnosis not present

## 2019-04-08 DIAGNOSIS — I480 Paroxysmal atrial fibrillation: Secondary | ICD-10-CM | POA: Diagnosis not present

## 2019-04-08 DIAGNOSIS — I5022 Chronic systolic (congestive) heart failure: Secondary | ICD-10-CM | POA: Insufficient documentation

## 2019-04-08 DIAGNOSIS — I255 Ischemic cardiomyopathy: Secondary | ICD-10-CM | POA: Insufficient documentation

## 2019-04-08 DIAGNOSIS — Z79899 Other long term (current) drug therapy: Secondary | ICD-10-CM | POA: Insufficient documentation

## 2019-04-08 DIAGNOSIS — Z8673 Personal history of transient ischemic attack (TIA), and cerebral infarction without residual deficits: Secondary | ICD-10-CM | POA: Diagnosis not present

## 2019-04-08 DIAGNOSIS — Z951 Presence of aortocoronary bypass graft: Secondary | ICD-10-CM | POA: Insufficient documentation

## 2019-04-08 DIAGNOSIS — I083 Combined rheumatic disorders of mitral, aortic and tricuspid valves: Secondary | ICD-10-CM | POA: Insufficient documentation

## 2019-04-08 DIAGNOSIS — I8290 Acute embolism and thrombosis of unspecified vein: Secondary | ICD-10-CM | POA: Insufficient documentation

## 2019-04-08 DIAGNOSIS — R079 Chest pain, unspecified: Secondary | ICD-10-CM | POA: Diagnosis not present

## 2019-04-08 DIAGNOSIS — F17210 Nicotine dependence, cigarettes, uncomplicated: Secondary | ICD-10-CM | POA: Insufficient documentation

## 2019-04-08 LAB — BASIC METABOLIC PANEL
Anion gap: 10 (ref 5–15)
BUN: 10 mg/dL (ref 6–20)
CO2: 21 mmol/L — ABNORMAL LOW (ref 22–32)
Calcium: 9.1 mg/dL (ref 8.9–10.3)
Chloride: 108 mmol/L (ref 98–111)
Creatinine, Ser: 0.88 mg/dL (ref 0.61–1.24)
GFR calc Af Amer: >60 mL/min (ref >60)
GFR calc non Af Amer: >60 mL/min (ref >60)
Glucose, Bld: 89 mg/dL (ref 70–99)
Potassium: 4.4 mmol/L (ref 3.5–5.1)
Sodium: 139 mmol/L (ref 135–145)

## 2019-04-08 MED ORDER — LOSARTAN POTASSIUM 25 MG PO TABS: 25.0000 mg | ORAL_TABLET | Freq: Two times a day (BID) | ORAL | 6 refills | Status: DC

## 2019-04-08 MED ORDER — ASPIRIN EC 81 MG PO TBEC: 81.0000 mg | DELAYED_RELEASE_TABLET | Freq: Every day | ORAL | 3 refills | Status: AC

## 2019-04-08 NOTE — Progress Notes (Signed)
  Echocardiogram 2D Echocardiogram has been performed.  Logan [name] 04/08/2019, 2:04 PM

## 2019-04-08 NOTE — Patient Instructions (Signed)
It was a pleasure seeing you today!  MEDICATIONS: -We are changing your medications today -Increase losartan to 25 mg (1 tab) by mouth twice daily -Call if you have questions about your medications.  LABS: -We will call you if your labs need attention.  NEXT APPOINTMENT: Return to clinic in 3 weeks with Pharmacy Clinic.  In general, to take care of your heart failure: -Limit your fluid intake to 2 Liters (half-gallon) per day.   -Limit your salt intake to ideally 2-3 grams (2000-3000 mg) per day. -Weigh yourself daily and record, and bring that "weight diary" to your next appointment.  (Weight gain of 2-3 pounds in 1 day typically means fluid weight.) -The medications for your heart are to help your heart and help you live longer.   -Please contact us before stopping any of your heart medications.  Call the clinic at [phone] with questions or to reschedule future appointments.

## 2019-04-11 NOTE — Progress Notes (Signed)
PCP: None HF: Dr. Haroldine Laws  HPI:  Logan [name] is a 43 year old male with CAD s/p CABG x 3; LIMA-LAD, SVG -obtuse marginal, SVG-RCA (07/2012), ischemic cardiomyopathy- Hx of anterolateral MI with DES to LAD; reinfarction 02/05/12 while in [place], [place] with thrombosis of the stent and repeat stenting. He also has a history of nicotine dependence, previous ETOH abuse, multiple CVAs, PAF and non-compliance. We have not seen him in Clinic since August 2017.   Admitted early December 2016 with CP. Had myoview extensive scar with minimal peri-infarct ischemia. Areas of scar correlated with findings on his cardiac MRI in 2014. Troponin remained mildly elevated but did not demonstrate typical rise and fall as expected with ACS - more consistent with significant cardiomyopathy. EF not  calculated. Later discharged home.   Admitted 7/5-01/07/16  with TIA symptoms in the setting of stopping Xarelto for several days (has had the same thing in the past). Improved spontaneously.  CT unremarkable.  No MRI with ICD (has a [employer] that is MRI compatible, but radiology refused per patient). Needs to make follow up with stroke clinic.   Admitted last in 6/19 with recurrent TIA?CVA with LUE numbness.   He is s/p [employer] S-ICD  Says he moved to [place] but things didn't work out with his ex-wife so moved back to [place] last year.   He recently presented to HF Clinic with Dr. Haroldine Laws to re-establish care. Says he is doing ok. Breathing is good as long as he doesn't "overdo it". Denied CP. Reported that he was able to complete all ADLs and walk to mailbox without problem. He push mows his yard for exercise and did report SOB if he goes to hard. He was worried he has COPD "like my momma does". He was smoking 3 ppd, but now only smokes a few cigs per week. Also cut back on drinking. He reported drinking 1 beer a week "just to keep my kidneys clean." He was previously drinking 3 x 18 packs per week. Not taking any  medicines in close to a year. Had orthopnea. Was not taking any lasix.   Today he returns to HF clinic for pharmacist medication titration. At last visit with MD, losartan 25 mg daily, spironolactone 12.5 mg daily and furosemide 20 mg daily were added. Overall he was feeling well today. No dizziness, lightheadedness, chest pain or palpitations. He feels like his activity level has declined since his stroke. Still has residual right-sided weakness and this limits his walking distance more than his heart failure. He used to be able to mow his lawn in 45 minutes, now it takes him >3 hours. He is able to perform all ADLs. He does not weigh himself daily at home (lost scale). Feels like his breathing is better since restarting furosemide but not back to baseline. Sleeping on 3 pillows (2 baseline) and has frequent PND. Trace edema in lower extremities and JVD 4 cm above the clavicle on exam.  ReDS reading in clinic 33%. He endorsed drinking three 8 oz bottles of water a day plus 12 cups of coffee (~18 oz). His appetite is good and he follows a low-salt diet.         . Shortness of breath/dyspnea on exertion? no  . Orthopnea/PND? Yes - 3 pillows, PND 2-3x/week . Edema? trace . Lightheadedness/dizziness? no . Daily weights at home? No - lost scale . Blood pressure/heart rate monitoring at home? no . Following low-sodium/fluid-restricted diet? yes  HF Medications: Losartan 25 mg daily Spironolactone 12.5  mg daily Furosemide 20 mg daily  Has the patient been experiencing any side effects to the medications prescribed?  Yes - some leg cramps with furosemide  Does the patient have any problems obtaining medications due to transportation or finances?   No - has Medicare/Medicaid with limited income subsidy  Understanding of regimen: good Understanding of indications: good Potential of compliance: fair Patient understands to avoid NSAIDs. Patient understands to avoid decongestants.    Pertinent Lab  Values: . Serum creatinine 0.88, BUN 10, Potassium 4.4, Sodium 139, BNP 771.1 (03/25/19)   Vital Signs: . Weight: 150.2 lbs (dry weight: 151.4) . Blood pressure: 114/70  . Heart rate: 74   Assessment: 1) Chronic systolic HF: Echo (11/15) with EF 10-15%. Ischemic cardiomyopathy, not much improvement in LV function post-CABG.  Has Subcutaneous Architect.  - NYHA class II-III symptoms in the setting of complete non-compliance -  Volume status elevated, but improved since restarting furosemide. ReDS reading 33%.  - Repeat echo today, 04/08/19 showed EF 20-25%, normal RV function, severe MR. Will need further evaluation for MR.  - Increase losartan to 25 mg BID. Plan to switch to [facility] next visit if tolerated (copay: $3.90). Repeat BMET in 2 weeks.   - Continue Lasix 20mg  daily.  - Continue Spironolactone 12.5 mg daily - Consider restarting carvedilol 3.125 mg BID once euvolemic (has previously tolerated 6.25 mg BID). - ICD interrogated in clinic 03/25/19. No VT  4.5 years left on battery - Given LBBB would ideally also have CRT. Dr. 03/27/19 referred him back to EP clinic to re-establish care. Can decide on CRT down the road.  2) Chest pain/CAD: s/p CABG x 3 in 1/14. - No s/s ischemia - LHC 06/2015 with severe native disease. Patent SVG (RPDA) and SVR (OM2).  LIMA is very small due to competitive flow in native LAD. There is a bout a 70% narrowing at the insertion with the LAD - Resume atorvastatin 80 mg daily and Xarelto.  - Decrease aspirin to 81 mg daily  3) Mural Thrombus:  Not able to handle coumadin. Continue to Xarelto for thrombus and PAF.  - Needs repeat echo.  -Continue Xarelto  4) ETOH abuse: - has cut way back  5) Current Smoker:  - Has cut back to 3 cigs/week - Encouraged to stop all together.    6) TIA/CVA- 05/2014 and 01/2016, 12/19  - Continue Xarelto, aspirin and statin.  Encouraged him not to stop these again.  -Aspirin decreased from 325 mg  daily to 81 mg daily given concomitant Xarelto.   7) PAF - In NSR. This patients CHA2DS2-VASc Score and unadjusted Ischemic Stroke Rate (% per year) is equal to 4.8 % stroke rate/year from a score of 4   -Continue Xarelto.   8) LBBB  - see above   Plan: 1) Medication changes: Based on clinical presentation, vital signs and recent labs will increase losartan to 25 mg BID.  2) Labs: stable Scr 0.88, K 4.4. BMET in 2 weeks 3) Follow-up: Pharmacy Clinic/BMET in 2 weeks.   1/20, PharmD, BCPS, CPP Heart Failure Clinic Pharmacist [phone]  Patient seen and discussed with Dr. [phone].  Agree with assessment and plan.  Would titrate medications slowly given history of on-compliance.    Marden Noble Pharm.D. CPP, BCPS Clinical Pharmacist [phone] 04/11/2019 1:56 PM

## 2019-04-29 ENCOUNTER — Other Ambulatory Visit: Payer: Self-pay

## 2019-04-29 ENCOUNTER — Ambulatory Visit (HOSPITAL_COMMUNITY)
Admission: RE | Admit: 2019-04-29 | Discharge: 2019-04-29 | Disposition: A | Discharge status: Home / Self Care | Payer: Medicare Other | Source: Ambulatory Visit | Attending: Cardiology | Admitting: Cardiology

## 2019-04-29 VITALS — BP 130/80 | HR 65 | Wt 149.0 lb

## 2019-04-29 DIAGNOSIS — I5022 Chronic systolic (congestive) heart failure: Secondary | ICD-10-CM | POA: Diagnosis not present

## 2019-04-29 LAB — BASIC METABOLIC PANEL
Anion gap: 9 (ref 5–15)
BUN: 11 mg/dL (ref 6–20)
CO2: 20 mmol/L — ABNORMAL LOW (ref 22–32)
Calcium: 9 mg/dL (ref 8.9–10.3)
Chloride: 107 mmol/L (ref 98–111)
Creatinine, Ser: 0.99 mg/dL (ref 0.61–1.24)
GFR calc Af Amer: >60 mL/min (ref >60)
GFR calc non Af Amer: >60 mL/min (ref >60)
Glucose, Bld: 121 mg/dL — ABNORMAL HIGH (ref 70–99)
Potassium: 4.1 mmol/L (ref 3.5–5.1)
Sodium: 136 mmol/L (ref 135–145)

## 2019-04-29 LAB — MAGNESIUM: Magnesium: 2 mg/dL (ref 1.7–2.4)

## 2019-04-29 LAB — BRAIN NATRIURETIC PEPTIDE: B Natriuretic Peptide: 1182 pg/mL — ABNORMAL HIGH (ref 0.0–100.0)

## 2019-04-29 MED ORDER — SPIRONOLACTONE 25 MG PO TABS: 25.0000 mg | ORAL_TABLET | Freq: Every day | ORAL | 6 refills | Status: DC

## 2019-04-29 MED ORDER — FUROSEMIDE 20 MG PO TABS: 40.0000 mg | ORAL_TABLET | Freq: Two times a day (BID) | ORAL | 1 refills | Status: DC

## 2019-04-29 NOTE — Patient Instructions (Addendum)
It was a pleasure seeing you today!  MEDICATIONS: -We are changing your medications today -Increase furosemide to 40 mg twice daily.  -Increase Losartan to 25 mg twice daily -STOP taking carvedilol -Weigh yourself daily and record weight. -Need to practice fluid restriction. Limit to 2 L of fluid per day. -Call if you have questions about your medications.   LABS: -We will call you if your labs need attention.  NEXT APPOINTMENT:  Return to clinic in 1 week for lab check. 2 weeks for another Pharmacy Clinic Visit.  In general, to take care of your heart failure: -Limit your fluid intake to 2 Liters (half-gallon) per day.   -Limit your salt intake to ideally 2-3 grams (2000-3000 mg) per day. -Weigh yourself daily and record, and bring that "weight diary" to your next appointment.  (Weight gain of 2-3 pounds in 1 day typically means fluid weight.) -The medications for your heart are to help your heart and help you live longer.   -Please contact us before stopping any of your heart medications.  Call the clinic at [phone] with questions or to reschedule future appointments.

## 2019-04-30 ENCOUNTER — Telehealth (HOSPITAL_COMMUNITY): Payer: Self-pay | Admitting: Licensed Clinical Social Worker

## 2019-04-30 NOTE — Telephone Encounter (Signed)
CSW consulted by clinic pharmacist to discuss [employer] program with patient who has been taking medications incorrectly.  CSW called and spoke with pt who is agreeable to being enrolled with paramedicine but informed CSW that he has moved to [place] which is [place] explained to patient that this would mean a referral to the [employer] and pt remains agreeable.  CSW to complete paperwork and have pt sign when he comes into clinic next week for lab work.  CSW will continue to follow and assist as needed  Jorge Ny, [place] [facility] Desk#: [phone] Cell#: [phone]

## 2019-05-02 ENCOUNTER — Inpatient Hospital Stay (HOSPITAL_COMMUNITY)
Admission: EM | Admit: 2019-05-02 | Discharge: 2019-05-06 | DRG: 023 | Disposition: A | Discharge status: Home / Self Care | Payer: Medicare Other | Attending: Neurology | Admitting: Neurology

## 2019-05-02 ENCOUNTER — Inpatient Hospital Stay (HOSPITAL_COMMUNITY): Payer: Medicare Other

## 2019-05-02 ENCOUNTER — Encounter (HOSPITAL_COMMUNITY)
Admission: EM | Disposition: A | Discharge status: Home / Self Care | Payer: Self-pay | Source: Home / Self Care | Attending: Neurology

## 2019-05-02 ENCOUNTER — Emergency Department (HOSPITAL_COMMUNITY): Payer: Medicare Other | Admitting: Certified Registered"

## 2019-05-02 ENCOUNTER — Emergency Department (HOSPITAL_COMMUNITY): Payer: Medicare Other

## 2019-05-02 DIAGNOSIS — I6602 Occlusion and stenosis of left middle cerebral artery: Secondary | ICD-10-CM

## 2019-05-02 DIAGNOSIS — I9581 Postprocedural hypotension: Secondary | ICD-10-CM | POA: Diagnosis not present

## 2019-05-02 DIAGNOSIS — I5023 Acute on chronic systolic (congestive) heart failure: Secondary | ICD-10-CM | POA: Diagnosis not present

## 2019-05-02 DIAGNOSIS — Z7289 Other problems related to lifestyle: Secondary | ICD-10-CM

## 2019-05-02 DIAGNOSIS — I110 Hypertensive heart disease with heart failure: Secondary | ICD-10-CM | POA: Diagnosis present

## 2019-05-02 DIAGNOSIS — Z0189 Encounter for other specified special examinations: Secondary | ICD-10-CM

## 2019-05-02 DIAGNOSIS — Z79899 Other long term (current) drug therapy: Secondary | ICD-10-CM | POA: Diagnosis not present

## 2019-05-02 DIAGNOSIS — G8191 Hemiplegia, unspecified affecting right dominant side: Secondary | ICD-10-CM | POA: Diagnosis present

## 2019-05-02 DIAGNOSIS — I252 Old myocardial infarction: Secondary | ICD-10-CM

## 2019-05-02 DIAGNOSIS — Z8261 Family history of arthritis: Secondary | ICD-10-CM

## 2019-05-02 DIAGNOSIS — J9601 Acute respiratory failure with hypoxia: Secondary | ICD-10-CM | POA: Diagnosis not present

## 2019-05-02 DIAGNOSIS — R4701 Aphasia: Secondary | ICD-10-CM | POA: Diagnosis present

## 2019-05-02 DIAGNOSIS — I63512 Cerebral infarction due to unspecified occlusion or stenosis of left middle cerebral artery: Secondary | ICD-10-CM

## 2019-05-02 DIAGNOSIS — I69322 Dysarthria following cerebral infarction: Secondary | ICD-10-CM

## 2019-05-02 DIAGNOSIS — J9589 Other postprocedural complications and disorders of respiratory system, not elsewhere classified: Secondary | ICD-10-CM | POA: Diagnosis not present

## 2019-05-02 DIAGNOSIS — R001 Bradycardia, unspecified: Secondary | ICD-10-CM | POA: Diagnosis not present

## 2019-05-02 DIAGNOSIS — I63412 Cerebral infarction due to embolism of left middle cerebral artery: Secondary | ICD-10-CM | POA: Diagnosis present

## 2019-05-02 DIAGNOSIS — I2510 Atherosclerotic heart disease of native coronary artery without angina pectoris: Secondary | ICD-10-CM | POA: Diagnosis present

## 2019-05-02 DIAGNOSIS — Z9119 Patient's noncompliance with other medical treatment and regimen: Secondary | ICD-10-CM

## 2019-05-02 DIAGNOSIS — D689 Coagulation defect, unspecified: Secondary | ICD-10-CM | POA: Diagnosis not present

## 2019-05-02 DIAGNOSIS — F17210 Nicotine dependence, cigarettes, uncomplicated: Secondary | ICD-10-CM | POA: Diagnosis present

## 2019-05-02 DIAGNOSIS — I609 Nontraumatic subarachnoid hemorrhage, unspecified: Secondary | ICD-10-CM | POA: Diagnosis present

## 2019-05-02 DIAGNOSIS — T4275XA Adverse effect of unspecified antiepileptic and sedative-hypnotic drugs, initial encounter: Secondary | ICD-10-CM | POA: Diagnosis not present

## 2019-05-02 DIAGNOSIS — R778 Other specified abnormalities of plasma proteins: Secondary | ICD-10-CM

## 2019-05-02 DIAGNOSIS — R29709 NIHSS score 9: Secondary | ICD-10-CM | POA: Diagnosis present

## 2019-05-02 DIAGNOSIS — I639 Cerebral infarction, unspecified: Secondary | ICD-10-CM

## 2019-05-02 DIAGNOSIS — Z951 Presence of aortocoronary bypass graft: Secondary | ICD-10-CM

## 2019-05-02 DIAGNOSIS — F1210 Cannabis abuse, uncomplicated: Secondary | ICD-10-CM | POA: Diagnosis present

## 2019-05-02 DIAGNOSIS — Z9911 Dependence on respirator [ventilator] status: Secondary | ICD-10-CM | POA: Diagnosis not present

## 2019-05-02 DIAGNOSIS — E785 Hyperlipidemia, unspecified: Secondary | ICD-10-CM | POA: Diagnosis present

## 2019-05-02 DIAGNOSIS — I255 Ischemic cardiomyopathy: Secondary | ICD-10-CM | POA: Diagnosis not present

## 2019-05-02 DIAGNOSIS — D62 Acute posthemorrhagic anemia: Secondary | ICD-10-CM | POA: Diagnosis not present

## 2019-05-02 DIAGNOSIS — I480 Paroxysmal atrial fibrillation: Secondary | ICD-10-CM | POA: Diagnosis present

## 2019-05-02 DIAGNOSIS — Z20828 Contact with and (suspected) exposure to other viral communicable diseases: Secondary | ICD-10-CM | POA: Diagnosis present

## 2019-05-02 DIAGNOSIS — Z7901 Long term (current) use of anticoagulants: Secondary | ICD-10-CM

## 2019-05-02 DIAGNOSIS — Z825 Family history of asthma and other chronic lower respiratory diseases: Secondary | ICD-10-CM | POA: Diagnosis not present

## 2019-05-02 DIAGNOSIS — Z8249 Family history of ischemic heart disease and other diseases of the circulatory system: Secondary | ICD-10-CM

## 2019-05-02 DIAGNOSIS — J9811 Atelectasis: Secondary | ICD-10-CM | POA: Diagnosis not present

## 2019-05-02 DIAGNOSIS — I509 Heart failure, unspecified: Secondary | ICD-10-CM

## 2019-05-02 DIAGNOSIS — I081 Rheumatic disorders of both mitral and tricuspid valves: Secondary | ICD-10-CM | POA: Diagnosis present

## 2019-05-02 DIAGNOSIS — I952 Hypotension due to drugs: Secondary | ICD-10-CM | POA: Diagnosis not present

## 2019-05-02 DIAGNOSIS — Z955 Presence of coronary angioplasty implant and graft: Secondary | ICD-10-CM

## 2019-05-02 HISTORY — PX: RADIOLOGY WITH ANESTHESIA: SHX6223

## 2019-05-02 HISTORY — PX: IR CT HEAD LTD: IMG2386

## 2019-05-02 HISTORY — DX: Unspecified atrial fibrillation: I48.91

## 2019-05-02 HISTORY — PX: IR PERCUTANEOUS ART THROMBECTOMY/INFUSION INTRACRANIAL INC DIAG ANGIO: IMG6087

## 2019-05-02 HISTORY — DX: Anemia, unspecified: D64.9

## 2019-05-02 LAB — COMPREHENSIVE METABOLIC PANEL
ALT: 26 U/L (ref 0–44)
AST: 29 U/L (ref 15–41)
Albumin: 3.8 g/dL (ref 3.5–5.0)
Alkaline Phosphatase: 109 U/L (ref 38–126)
Anion gap: 11 (ref 5–15)
BUN: 11 mg/dL (ref 6–20)
CO2: 22 mmol/L (ref 22–32)
Calcium: 9 mg/dL (ref 8.9–10.3)
Chloride: 106 mmol/L (ref 98–111)
Creatinine, Ser: 1.23 mg/dL (ref 0.61–1.24)
GFR calc Af Amer: >60 mL/min (ref >60)
GFR calc non Af Amer: >60 mL/min (ref >60)
Glucose, Bld: 96 mg/dL (ref 70–99)
Potassium: 3.6 mmol/L (ref 3.5–5.1)
Sodium: 139 mmol/L (ref 135–145)
Total Bilirubin: 0.8 mg/dL (ref 0.3–1.2)
Total Protein: 6.5 g/dL (ref 6.5–8.1)

## 2019-05-02 LAB — CBC
HCT: 39.5 % (ref 39.0–52.0)
Hemoglobin: 13.8 g/dL (ref 13.0–17.0)
MCH: 34.9 pg — ABNORMAL HIGH (ref 26.0–34.0)
MCHC: 34.9 g/dL (ref 30.0–36.0)
MCV: 100 fL (ref 80.0–100.0)
Platelets: 224 10*3/uL (ref 150–400)
RBC: 3.95 MIL/uL — ABNORMAL LOW (ref 4.22–5.81)
RDW: 12.8 % (ref 11.5–15.5)
WBC: 6.1 10*3/uL (ref 4.0–10.5)
nRBC: 0 % (ref 0.0–0.2)

## 2019-05-02 LAB — BASIC METABOLIC PANEL
Anion gap: 7 (ref 5–15)
BUN: 8 mg/dL (ref 6–20)
CO2: 20 mmol/L — ABNORMAL LOW (ref 22–32)
Calcium: 7.9 mg/dL — ABNORMAL LOW (ref 8.9–10.3)
Chloride: 107 mmol/L (ref 98–111)
Creatinine, Ser: 0.99 mg/dL (ref 0.61–1.24)
GFR calc Af Amer: >60 mL/min (ref >60)
GFR calc non Af Amer: >60 mL/min (ref >60)
Glucose, Bld: 136 mg/dL — ABNORMAL HIGH (ref 70–99)
Potassium: 3.8 mmol/L (ref 3.5–5.1)
Sodium: 134 mmol/L — ABNORMAL LOW (ref 135–145)

## 2019-05-02 LAB — MRSA PCR SCREENING
MRSA by PCR: NEGATIVE
MRSA by PCR: NEGATIVE

## 2019-05-02 LAB — POCT I-STAT 7, (LYTES, BLD GAS, ICA,H+H)
Acid-base deficit: 1 mmol/L (ref 0.0–2.0)
Bicarbonate: 24 mmol/L (ref 20.0–28.0)
Calcium, Ion: 1.14 mmol/L — ABNORMAL LOW (ref 1.15–1.40)
HCT: 36 % — ABNORMAL LOW (ref 39.0–52.0)
Hemoglobin: 12.2 g/dL — ABNORMAL LOW (ref 13.0–17.0)
O2 Saturation: 100 %
Patient temperature: 98.6
Potassium: 4.8 mmol/L (ref 3.5–5.1)
Sodium: 138 mmol/L (ref 135–145)
TCO2: 25 mmol/L (ref 22–32)
pCO2 arterial: 40.6 mmHg (ref 32.0–48.0)
pH, Arterial: 7.38 (ref 7.350–7.450)
pO2, Arterial: 457 mmHg — ABNORMAL HIGH (ref 83.0–108.0)

## 2019-05-02 LAB — CBC WITH DIFFERENTIAL/PLATELET
Abs Immature Granulocytes: 0.02 10*3/uL (ref 0.00–0.07)
Basophils Absolute: 0 10*3/uL (ref 0.0–0.1)
Basophils Relative: 0 %
Eosinophils Absolute: 0 10*3/uL (ref 0.0–0.5)
Eosinophils Relative: 0 %
HCT: 35.5 % — ABNORMAL LOW (ref 39.0–52.0)
Hemoglobin: 12.6 g/dL — ABNORMAL LOW (ref 13.0–17.0)
Immature Granulocytes: 0 %
Lymphocytes Relative: 18 %
Lymphs Abs: 1.1 10*3/uL (ref 0.7–4.0)
MCH: 35.5 pg — ABNORMAL HIGH (ref 26.0–34.0)
MCHC: 35.5 g/dL (ref 30.0–36.0)
MCV: 100 fL (ref 80.0–100.0)
Monocytes Absolute: 0.4 10*3/uL (ref 0.1–1.0)
Monocytes Relative: 7 %
Neutro Abs: 4.6 10*3/uL (ref 1.7–7.7)
Neutrophils Relative %: 75 %
Platelets: 215 10*3/uL (ref 150–400)
RBC: 3.55 MIL/uL — ABNORMAL LOW (ref 4.22–5.81)
RDW: 13.1 % (ref 11.5–15.5)
WBC: 6.2 10*3/uL (ref 4.0–10.5)
nRBC: 0 % (ref 0.0–0.2)

## 2019-05-02 LAB — DIFFERENTIAL
Abs Immature Granulocytes: 0.02 10*3/uL (ref 0.00–0.07)
Basophils Absolute: 0 10*3/uL (ref 0.0–0.1)
Basophils Relative: 1 %
Eosinophils Absolute: 0.1 10*3/uL (ref 0.0–0.5)
Eosinophils Relative: 2 %
Immature Granulocytes: 0 %
Lymphocytes Relative: 35 %
Lymphs Abs: 2.1 10*3/uL (ref 0.7–4.0)
Monocytes Absolute: 0.6 10*3/uL (ref 0.1–1.0)
Monocytes Relative: 10 %
Neutro Abs: 3.2 10*3/uL (ref 1.7–7.7)
Neutrophils Relative %: 52 %

## 2019-05-02 LAB — RAPID URINE DRUG SCREEN, HOSP PERFORMED
Amphetamines: NOT DETECTED
Barbiturates: NOT DETECTED
Benzodiazepines: NOT DETECTED
Cocaine: NOT DETECTED
Opiates: NOT DETECTED
Tetrahydrocannabinol: NOT DETECTED

## 2019-05-02 LAB — HEMOGLOBIN AND HEMATOCRIT, BLOOD
HCT: 36.2 % — ABNORMAL LOW (ref 39.0–52.0)
Hemoglobin: 12.6 g/dL — ABNORMAL LOW (ref 13.0–17.0)

## 2019-05-02 LAB — I-STAT CHEM 8, ED
BUN: 11 mg/dL (ref 6–20)
Calcium, Ion: 1.08 mmol/L — ABNORMAL LOW (ref 1.15–1.40)
Chloride: 102 mmol/L (ref 98–111)
Creatinine, Ser: 1.1 mg/dL (ref 0.61–1.24)
Glucose, Bld: 90 mg/dL (ref 70–99)
HCT: 42 % (ref 39.0–52.0)
Hemoglobin: 14.3 g/dL (ref 13.0–17.0)
Potassium: 3.5 mmol/L (ref 3.5–5.1)
Sodium: 140 mmol/L (ref 135–145)
TCO2: 20 mmol/L — ABNORMAL LOW (ref 22–32)

## 2019-05-02 LAB — LIPID PANEL
Cholesterol: 122 mg/dL (ref 0–200)
HDL: 31 mg/dL — ABNORMAL LOW (ref >40)
LDL Cholesterol: 84 mg/dL (ref 0–99)
Total CHOL/HDL Ratio: 3.9 RATIO
Triglycerides: 34 mg/dL (ref <150)
VLDL: 7 mg/dL (ref 0–40)

## 2019-05-02 LAB — CBG MONITORING, ED: Glucose-Capillary: 86 mg/dL (ref 70–99)

## 2019-05-02 LAB — ECHOCARDIOGRAM COMPLETE
Height: 68 in
Weight: 2326.4 oz

## 2019-05-02 LAB — TROPONIN I (HIGH SENSITIVITY)
Troponin I (High Sensitivity): 51 ng/L — ABNORMAL HIGH (ref <18)
Troponin I (High Sensitivity): 54 ng/L — ABNORMAL HIGH (ref <18)

## 2019-05-02 LAB — PROTIME-INR
INR: 2.1 — ABNORMAL HIGH (ref 0.8–1.2)
INR: 3.4 — ABNORMAL HIGH (ref 0.8–1.2)
Prothrombin Time: 23.6 seconds — ABNORMAL HIGH (ref 11.4–15.2)
Prothrombin Time: 33.8 seconds — ABNORMAL HIGH (ref 11.4–15.2)

## 2019-05-02 LAB — MAGNESIUM: Magnesium: 1.9 mg/dL (ref 1.7–2.4)

## 2019-05-02 LAB — HEMOGLOBIN A1C
Hgb A1c MFr Bld: 5.4 % (ref 4.8–5.6)
Mean Plasma Glucose: 108.28 mg/dL

## 2019-05-02 LAB — APTT: aPTT: 45 seconds — ABNORMAL HIGH (ref 24–36)

## 2019-05-02 LAB — PHOSPHORUS: Phosphorus: 3.2 mg/dL (ref 2.5–4.6)

## 2019-05-02 LAB — GLUCOSE, CAPILLARY
Glucose-Capillary: 103 mg/dL — ABNORMAL HIGH (ref 70–99)
Glucose-Capillary: 96 mg/dL (ref 70–99)
Glucose-Capillary: 94 mg/dL (ref 70–99)
Glucose-Capillary: 132 mg/dL — ABNORMAL HIGH (ref 70–99)

## 2019-05-02 LAB — HEPARIN LEVEL (UNFRACTIONATED): Heparin Unfractionated: >2.2 IU/mL — ABNORMAL HIGH (ref 0.30–0.70)

## 2019-05-02 LAB — SARS CORONAVIRUS 2 BY RT PCR (HOSPITAL ORDER, PERFORMED IN ~~LOC~~ HOSPITAL LAB): SARS Coronavirus 2: NEGATIVE

## 2019-05-02 LAB — HIV ANTIBODY (ROUTINE TESTING W REFLEX): HIV Screen 4th Generation wRfx: NONREACTIVE

## 2019-05-02 SURGERY — IR WITH ANESTHESIA
Anesthesia: General

## 2019-05-02 MED ORDER — PRO-STAT SUGAR FREE PO LIQD
30.0000 mL | Freq: Two times a day (BID) | ORAL | Status: DC
  Administered 2019-05-02: 30 mL
  Filled 2019-05-02: qty 30

## 2019-05-02 MED ORDER — NITROGLYCERIN 1 MG/10 ML FOR IR/CATH LAB
INTRA_ARTERIAL | Status: AC
  Filled 2019-05-02: qty 10

## 2019-05-02 MED ORDER — BUDESONIDE 0.25 MG/2ML IN SUSP
0.2500 mg | Freq: Two times a day (BID) | RESPIRATORY_TRACT | Status: DC
  Administered 2019-05-02 – 2019-05-06 (×9): 0.25 mg via RESPIRATORY_TRACT
  Filled 2019-05-02 (×9): qty 2

## 2019-05-02 MED ORDER — NOREPINEPHRINE 4 MG/250ML-% IV SOLN
0.0000 ug/min | INTRAVENOUS | Status: DC
  Administered 2019-05-02 (×2): 5 ug/min via INTRAVENOUS
  Administered 2019-05-03: 12 ug/min via INTRAVENOUS
  Administered 2019-05-03: 6 ug/min via INTRAVENOUS
  Filled 2019-05-02 (×3): qty 250

## 2019-05-02 MED ORDER — FENTANYL CITRATE (PF) 100 MCG/2ML IJ SOLN
INTRAMUSCULAR | Status: AC
  Filled 2019-05-02: qty 2

## 2019-05-02 MED ORDER — CEFAZOLIN SODIUM-DEXTROSE 2-3 GM-%(50ML) IV SOLR
INTRAVENOUS | Status: DC | PRN
  Administered 2019-05-02: 2 g via INTRAVENOUS

## 2019-05-02 MED ORDER — CHLORHEXIDINE GLUCONATE CLOTH 2 % EX PADS
6.0000 | MEDICATED_PAD | Freq: Every day | CUTANEOUS | Status: DC
  Administered 2019-05-02: 6 via TOPICAL

## 2019-05-02 MED ORDER — ALBUTEROL SULFATE (2.5 MG/3ML) 0.083% IN NEBU: 2.5000 mg | INHALATION_SOLUTION | RESPIRATORY_TRACT | Status: DC | PRN

## 2019-05-02 MED ORDER — IOHEXOL 300 MG/ML  SOLN
150.0000 mL | Freq: Once | INTRAMUSCULAR | Status: AC | PRN
  Administered 2019-05-02: 65 mL via INTRA_ARTERIAL

## 2019-05-02 MED ORDER — NITROGLYCERIN 1 MG/10 ML FOR IR/CATH LAB
INTRA_ARTERIAL | Status: AC | PRN
  Administered 2019-05-02 (×6): 25 ug via INTRA_ARTERIAL

## 2019-05-02 MED ORDER — LACTATED RINGERS IV SOLN
INTRAVENOUS | Status: DC | PRN
  Administered 2019-05-02 (×2): via INTRAVENOUS

## 2019-05-02 MED ORDER — VASOPRESSIN 20 UNIT/ML IV SOLN
INTRAVENOUS | Status: DC | PRN
  Administered 2019-05-02: 2 [IU] via INTRAVENOUS

## 2019-05-02 MED ORDER — ACETAMINOPHEN 325 MG PO TABS: 650.0000 mg | ORAL_TABLET | ORAL | Status: DC | PRN

## 2019-05-02 MED ORDER — PHENYLEPHRINE HCL (PRESSORS) 10 MG/ML IV SOLN
INTRAVENOUS | Status: DC | PRN
  Administered 2019-05-02: 40 ug via INTRAVENOUS

## 2019-05-02 MED ORDER — CLEVIDIPINE BUTYRATE 0.5 MG/ML IV EMUL: 0.0000 mg/h | INTRAVENOUS | Status: AC

## 2019-05-02 MED ORDER — SODIUM CHLORIDE 0.9 % IV SOLN: INTRAVENOUS | Status: DC

## 2019-05-02 MED ORDER — ACETAMINOPHEN 650 MG RE SUPP: 650.0000 mg | RECTAL | Status: DC | PRN

## 2019-05-02 MED ORDER — IOHEXOL 350 MG/ML SOLN
40.0000 mL | Freq: Once | INTRAVENOUS | Status: AC | PRN
  Administered 2019-05-02: 40 mL via INTRAVENOUS

## 2019-05-02 MED ORDER — IPRATROPIUM-ALBUTEROL 0.5-2.5 (3) MG/3ML IN SOLN
3.0000 mL | Freq: Four times a day (QID) | RESPIRATORY_TRACT | Status: DC
  Administered 2019-05-02: 3 mL via RESPIRATORY_TRACT
  Filled 2019-05-02: qty 3

## 2019-05-02 MED ORDER — NOREPINEPHRINE 4 MG/250ML-% IV SOLN
INTRAVENOUS | Status: AC
  Administered 2019-05-02: 2 ug/min via INTRAVENOUS
  Filled 2019-05-02: qty 250

## 2019-05-02 MED ORDER — ORAL CARE MOUTH RINSE
15.0000 mL | OROMUCOSAL | Status: DC
  Administered 2019-05-02 – 2019-05-03 (×14): 15 mL via OROMUCOSAL

## 2019-05-02 MED ORDER — NOREPINEPHRINE 4 MG/250ML-% IV SOLN
0.0000 ug/min | INTRAVENOUS | Status: DC
  Administered 2019-05-02: 05:00:00 2 ug/min via INTRAVENOUS

## 2019-05-02 MED ORDER — FENTANYL CITRATE (PF) 100 MCG/2ML IJ SOLN
INTRAMUSCULAR | Status: DC | PRN
  Administered 2019-05-02: 100 ug via INTRAVENOUS

## 2019-05-02 MED ORDER — ACETAMINOPHEN 160 MG/5ML PO SOLN: 650.0000 mg | ORAL | Status: DC | PRN

## 2019-05-02 MED ORDER — IOHEXOL 350 MG/ML SOLN
80.0000 mL | Freq: Once | INTRAVENOUS | Status: AC | PRN
  Administered 2019-05-02: 80 mL via INTRAVENOUS

## 2019-05-02 MED ORDER — SODIUM CHLORIDE 0.9% FLUSH
3.0000 mL | Freq: Once | INTRAVENOUS | Status: AC
  Administered 2019-05-02: 3 mL via INTRAVENOUS

## 2019-05-02 MED ORDER — PROPOFOL 10 MG/ML IV BOLUS
INTRAVENOUS | Status: DC | PRN
  Administered 2019-05-02: 100 mg via INTRAVENOUS

## 2019-05-02 MED ORDER — ONDANSETRON HCL 4 MG/2ML IJ SOLN
4.0000 mg | Freq: Four times a day (QID) | INTRAMUSCULAR | Status: DC | PRN
  Filled 2019-05-02: qty 2

## 2019-05-02 MED ORDER — EPTIFIBATIDE 20 MG/10ML IV SOLN
INTRAVENOUS | Status: AC
  Filled 2019-05-02: qty 10

## 2019-05-02 MED ORDER — CEFAZOLIN SODIUM-DEXTROSE 2-4 GM/100ML-% IV SOLN
INTRAVENOUS | Status: AC
  Filled 2019-05-02: qty 100

## 2019-05-02 MED ORDER — PHENYLEPHRINE HCL-NACL 10-0.9 MG/250ML-% IV SOLN
INTRAVENOUS | Status: DC | PRN
  Administered 2019-05-02: 50 ug/min via INTRAVENOUS

## 2019-05-02 MED ORDER — PROPOFOL 1000 MG/100ML IV EMUL
5.0000 ug/kg/min | INTRAVENOUS | Status: DC
  Administered 2019-05-02: 40 ug/kg/min via INTRAVENOUS
  Administered 2019-05-02: 60 ug/kg/min via INTRAVENOUS
  Administered 2019-05-02: 10 ug/kg/min via INTRAVENOUS
  Filled 2019-05-02 (×3): qty 100

## 2019-05-02 MED ORDER — PERFLUTREN LIPID MICROSPHERE
1.0000 mL | INTRAVENOUS | Status: AC | PRN
  Administered 2019-05-02: 3 mL via INTRAVENOUS
  Filled 2019-05-02: qty 10

## 2019-05-02 MED ORDER — SUCCINYLCHOLINE 20MG/ML (10ML) SYRINGE FOR MEDFUSION PUMP - OPTIME
INTRAMUSCULAR | Status: DC | PRN
  Administered 2019-05-02: 100 mg via INTRAVENOUS

## 2019-05-02 MED ORDER — VASOPRESSIN 20 UNIT/ML IV SOLN
INTRAVENOUS | Status: AC
  Filled 2019-05-02: qty 1

## 2019-05-02 MED ORDER — ROCURONIUM 10MG/ML (10ML) SYRINGE FOR MEDFUSION PUMP - OPTIME
INTRAVENOUS | Status: DC | PRN
  Administered 2019-05-02 (×2): 50 mg via INTRAVENOUS

## 2019-05-02 MED ORDER — PANTOPRAZOLE SODIUM 40 MG IV SOLR
40.0000 mg | INTRAVENOUS | Status: DC
  Administered 2019-05-02 – 2019-05-03 (×2): 40 mg via INTRAVENOUS
  Filled 2019-05-02 (×3): qty 40

## 2019-05-02 MED ORDER — STROKE: EARLY STAGES OF RECOVERY BOOK: Freq: Once | Status: DC

## 2019-05-02 MED ORDER — CHLORHEXIDINE GLUCONATE 0.12% ORAL RINSE (MEDLINE KIT)
15.0000 mL | Freq: Two times a day (BID) | OROMUCOSAL | Status: DC
  Administered 2019-05-02 – 2019-05-03 (×3): 15 mL via OROMUCOSAL

## 2019-05-02 MED ORDER — IOHEXOL 300 MG/ML  SOLN
150.0000 mL | Freq: Once | INTRAMUSCULAR | Status: AC | PRN
  Administered 2019-05-02: 75 mL via INTRA_ARTERIAL

## 2019-05-02 MED ORDER — IPRATROPIUM-ALBUTEROL 0.5-2.5 (3) MG/3ML IN SOLN: 3.0000 mL | RESPIRATORY_TRACT | Status: DC | PRN

## 2019-05-02 MED ORDER — VITAL HIGH PROTEIN PO LIQD
1000.0000 mL | ORAL | Status: DC
  Administered 2019-05-02: 1000 mL

## 2019-05-02 MED ORDER — SODIUM CHLORIDE 0.9 % IV SOLN
INTRAVENOUS | Status: AC
  Administered 2019-05-02 (×2): via INTRAVENOUS

## 2019-05-02 MED ORDER — FENTANYL 2500MCG IN NS 250ML (10MCG/ML) PREMIX INFUSION
0.0000 ug/h | INTRAVENOUS | Status: DC
  Administered 2019-05-02: 100 ug/h via INTRAVENOUS
  Filled 2019-05-02: qty 250

## 2019-05-02 NOTE — Sedation Documentation (Signed)
Spoke with [place], [place] [facility] Charge. Pt will be admitted to 4N28. Cheryl in pt transport called and requested bed be brought to IR2.

## 2019-05-02 NOTE — Progress Notes (Signed)
  Echocardiogram 2D Echocardiogram has been performed.  Darlina Sicilian M 05/02/2019, 9:10 AM

## 2019-05-02 NOTE — H&P (Signed)
Chief Complaint: aphasia, right side weakness  History obtained from: Patient and Chart     HPI:                                                                                                                                       Logan [name]. is a 43 y.o. 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  Also normal was 11 PM, witnessed by his cousin.  His cousin later found him slumped over next to the door with his right arm flaccid and not speaking.  EMS arrived at the scene and patient noted to be aphasic and plegic on the right side.  In route his right arm strength improved however patient continued to remain aphasic on arrival. BP was 104/75 mmHg .  A stat CT head was obtained which showed no hemorrhage, redemonstrated prior bilateral PCA infarcts.  tPA was not administered as patient is on Xarelto.  INR also elevated at 3.4.  CT angiogram showed a left M2 occlusion and CT perfusion showed a 67 cc penumbra and patient was taken to IR for mechanical thrombectomy.  Date last known well: 05/01/2019 Time last known well: 11 PM tPA Given: No patient on anticoagulation NIHSS: 9 Baseline MRS 1  Past medical history As mentioned above  No family history on file.   Social History:  has no history on file for tobacco, alcohol, and drug.  Allergies: Not on File  Medications:                                                                                                                        I reviewed home medications. Patient in [place].    ROS:  Unable to obtain due to aphasia   Examination:                                                                                                       General: Appears well-developed  Psych: Affect appropriate to situation Eyes: No scleral injection HENT: No OP obstrucion Head: Normocephalic.  Cardiovascular: Normal rate and regular rhythm. Respiratory: Effort normal and breath sounds normal to anterior ascultation GI: Soft.  No distension. There is no tenderness.  Skin: WDI    Neurological Examination Mental Status: Alert, mumbles incoherently and sometimes says yes.  Unable to answer any questions and does not follow commands. Cranial Nerves: II: Visual fields grossly normal,  III,IV, VI: ptosis not present, extra-ocular motions intact bilaterally, pupils equal, round, reactive to light and accommodation VII: Right facial droop VIII: hearing normal bilaterally IX,X: uvula rises symmetrically XI: bilateral shoulder shrug XII: midline tongue extension Motor: Right : Upper extremity   5/5    Left:     Upper extremity   5/5  Lower extremity   5/5     Lower extremity   5/5 Tone and bulk:normal tone throughout; no atrophy noted Sensory: Unable to assess, appears to feel sensation bilaterally Deep Tendon Reflexes: 2+ and symmetric throughout Plantars: Right: downgoing   Left: downgoing Cerebellar: No gross ataxia noted      Lab Results: Basic Metabolic Panel: Recent Labs  Lab 05/02/19 0033 05/02/19 0040  NA 139 140  K 3.6 3.5  CL 106 102  CO2 22  --   GLUCOSE 96 90  BUN 11 11  CREATININE 1.23 1.10  CALCIUM 9.0  --     CBC: Recent Labs  Lab 05/02/19 0033 05/02/19 0040  WBC 6.1  --   NEUTROABS 3.2  --   HGB 13.8 14.3  HCT 39.5 42.0  MCV 100.0  --   PLT 224  --     Coagulation Studies: Recent Labs    05/02/19 0033  LABPROT 33.8*  INR 3.4*    Imaging: Ct Angio Head W Or Wo Contrast  Result Date: 05/02/2019 EXAM: CT ANGIOGRAPHY HEAD AND NECK CT PERFUSION BRAIN TECHNIQUE: Multidetector CT imaging of the head and neck was performed  using the standard protocol during bolus administration of intravenous contrast. Multiplanar CT image reconstructions and MIPs were obtained to evaluate the vascular anatomy. Carotid stenosis measurements (when applicable) are obtained utilizing NASCET criteria, using the distal internal carotid diameter as the denominator. Multiphase CT imaging of the brain was performed following IV bolus contrast injection. Subsequent parametric perfusion maps were calculated using RAPID software. CONTRAST:  40mL OMNIPAQUE IOHEXOL 350 MG/ML SOLN; 80mL OMNIPAQUE IOHEXOL 350 MG/ML SOLN COMPARISON:  CTA head and neck September 18, 2018 FINDINGS: CTA NECK FINDINGS SKELETON: There is no bony spinal canal stenosis. No lytic or blastic lesion. OTHER NECK: Normal pharynx, larynx and major salivary glands. No cervical lymphadenopathy. Unremarkable thyroid gland. UPPER CHEST: No pneumothorax or pleural effusion. No nodules or masses. AORTIC ARCH: There is no calcific atherosclerosis of the aortic arch. There is no aneurysm, dissection or  hemodynamically significant stenosis of the visualized portion of the aorta. Conventional 3 vessel aortic branching pattern. The visualized proximal subclavian arteries are widely patent. RIGHT CAROTID SYSTEM: No dissection, occlusion or aneurysm. Mild atherosclerotic calcification at the carotid bifurcation without hemodynamically significant stenosis. LEFT CAROTID SYSTEM: No dissection, occlusion or aneurysm. Mild atherosclerotic calcification at the carotid bifurcation without hemodynamically significant stenosis. VERTEBRAL ARTERIES: Codominant configuration. Both origins are clearly patent. There is no dissection, occlusion or flow-limiting stenosis to the skull base (V1-V3 segments). CTA HEAD FINDINGS POSTERIOR CIRCULATION: --Vertebral arteries: Normal V4 segments. --Posterior inferior cerebellar arteries (PICA): Patent origins from the vertebral arteries. --Anterior inferior cerebellar arteries (AICA):  Patent origins from the basilar artery. --Basilar artery: Normal. --Superior cerebellar arteries: Normal. --Posterior cerebral arteries: Both are diminutive, in keeping with remote bilateral PCA territory infarcts. ANTERIOR CIRCULATION: --Intracranial internal carotid arteries: Normal. --Anterior cerebral arteries (ACA): Normal. Both A1 segments are present. Patent anterior communicating artery (a-comm). --Middle cerebral arteries (MCA): There is occlusion the left M2 inferior division midportion (series 9, image 98). The middle cerebral arteries are otherwise normal. VENOUS SINUSES: As permitted by contrast timing, patent. ANATOMIC VARIANTS: None Review of the MIP images confirms the above findings. CT Brain Perfusion Findings: ASPECTS: 10 CBF (<30%) Volume: 67mL Perfusion (Tmax>6.0s) volume: 60mL Mismatch Volume: 63mL Infarction Location:Posterior left MCA territory. IMPRESSION: 1. Occlusion of the left middle cerebral artery inferior division mid M2 segment. 2. 67 mL area of ischemic penumbra demonstrated by CT perfusion analysis. Critical Value/emergent results were called by telephone at the time of interpretation on 05/02/2019 at 1:07 am to [facility] , who verbally acknowledged these results. Electronically Signed   By: Deatra Robinson M.D.   On: 05/02/2019 01:26   Ct Angio Neck W Or Wo Contrast  Result Date: 05/02/2019 EXAM: CT ANGIOGRAPHY HEAD AND NECK CT PERFUSION BRAIN TECHNIQUE: Multidetector CT imaging of the head and neck was performed using the standard protocol during bolus administration of intravenous contrast. Multiplanar CT image reconstructions and MIPs were obtained to evaluate the vascular anatomy. Carotid stenosis measurements (when applicable) are obtained utilizing NASCET criteria, using the distal internal carotid diameter as the denominator. Multiphase CT imaging of the brain was performed following IV bolus contrast injection. Subsequent parametric perfusion maps were  calculated using RAPID software. CONTRAST:  14mL OMNIPAQUE IOHEXOL 350 MG/ML SOLN; 74mL OMNIPAQUE IOHEXOL 350 MG/ML SOLN COMPARISON:  CTA head and neck September 18, 2018 FINDINGS: CTA NECK FINDINGS SKELETON: There is no bony spinal canal stenosis. No lytic or blastic lesion. OTHER NECK: Normal pharynx, larynx and major salivary glands. No cervical lymphadenopathy. Unremarkable thyroid gland. UPPER CHEST: No pneumothorax or pleural effusion. No nodules or masses. AORTIC ARCH: There is no calcific atherosclerosis of the aortic arch. There is no aneurysm, dissection or hemodynamically significant stenosis of the visualized portion of the aorta. Conventional 3 vessel aortic branching pattern. The visualized proximal subclavian arteries are widely patent. RIGHT CAROTID SYSTEM: No dissection, occlusion or aneurysm. Mild atherosclerotic calcification at the carotid bifurcation without hemodynamically significant stenosis. LEFT CAROTID SYSTEM: No dissection, occlusion or aneurysm. Mild atherosclerotic calcification at the carotid bifurcation without hemodynamically significant stenosis. VERTEBRAL ARTERIES: Codominant configuration. Both origins are clearly patent. There is no dissection, occlusion or flow-limiting stenosis to the skull base (V1-V3 segments). CTA HEAD FINDINGS POSTERIOR CIRCULATION: --Vertebral arteries: Normal V4 segments. --Posterior inferior cerebellar arteries (PICA): Patent origins from the vertebral arteries. --Anterior inferior cerebellar arteries (AICA): Patent origins from the basilar artery. --Basilar artery: Normal. --Superior cerebellar arteries: Normal. --Posterior cerebral  arteries: Both are diminutive, in keeping with remote bilateral PCA territory infarcts. ANTERIOR CIRCULATION: --Intracranial internal carotid arteries: Normal. --Anterior cerebral arteries (ACA): Normal. Both A1 segments are present. Patent anterior communicating artery (a-comm). --Middle cerebral arteries (MCA): There is  occlusion the left M2 inferior division midportion (series 9, image 98). The middle cerebral arteries are otherwise normal. VENOUS SINUSES: As permitted by contrast timing, patent. ANATOMIC VARIANTS: None Review of the MIP images confirms the above findings. CT Brain Perfusion Findings: ASPECTS: 10 CBF (<30%) Volume: 67mL Perfusion (Tmax>6.0s) volume: 20mL Mismatch Volume: 57mL Infarction Location:Posterior left MCA territory. IMPRESSION: 1. Occlusion of the left middle cerebral artery inferior division mid M2 segment. 2. 67 mL area of ischemic penumbra demonstrated by CT perfusion analysis. Critical Value/emergent results were called by telephone at the time of interpretation on 05/02/2019 at 1:07 am to [facility] , who verbally acknowledged these results. Electronically Signed   By: Deatra Robinson M.D.   On: 05/02/2019 01:26   Ct Cerebral Perfusion W Contrast  Result Date: 05/02/2019 EXAM: CT ANGIOGRAPHY HEAD AND NECK CT PERFUSION BRAIN TECHNIQUE: Multidetector CT imaging of the head and neck was performed using the standard protocol during bolus administration of intravenous contrast. Multiplanar CT image reconstructions and MIPs were obtained to evaluate the vascular anatomy. Carotid stenosis measurements (when applicable) are obtained utilizing NASCET criteria, using the distal internal carotid diameter as the denominator. Multiphase CT imaging of the brain was performed following IV bolus contrast injection. Subsequent parametric perfusion maps were calculated using RAPID software. CONTRAST:  57mL OMNIPAQUE IOHEXOL 350 MG/ML SOLN; 98mL OMNIPAQUE IOHEXOL 350 MG/ML SOLN COMPARISON:  CTA head and neck September 18, 2018 FINDINGS: CTA NECK FINDINGS SKELETON: There is no bony spinal canal stenosis. No lytic or blastic lesion. OTHER NECK: Normal pharynx, larynx and major salivary glands. No cervical lymphadenopathy. Unremarkable thyroid gland. UPPER CHEST: No pneumothorax or pleural effusion. No nodules  or masses. AORTIC ARCH: There is no calcific atherosclerosis of the aortic arch. There is no aneurysm, dissection or hemodynamically significant stenosis of the visualized portion of the aorta. Conventional 3 vessel aortic branching pattern. The visualized proximal subclavian arteries are widely patent. RIGHT CAROTID SYSTEM: No dissection, occlusion or aneurysm. Mild atherosclerotic calcification at the carotid bifurcation without hemodynamically significant stenosis. LEFT CAROTID SYSTEM: No dissection, occlusion or aneurysm. Mild atherosclerotic calcification at the carotid bifurcation without hemodynamically significant stenosis. VERTEBRAL ARTERIES: Codominant configuration. Both origins are clearly patent. There is no dissection, occlusion or flow-limiting stenosis to the skull base (V1-V3 segments). CTA HEAD FINDINGS POSTERIOR CIRCULATION: --Vertebral arteries: Normal V4 segments. --Posterior inferior cerebellar arteries (PICA): Patent origins from the vertebral arteries. --Anterior inferior cerebellar arteries (AICA): Patent origins from the basilar artery. --Basilar artery: Normal. --Superior cerebellar arteries: Normal. --Posterior cerebral arteries: Both are diminutive, in keeping with remote bilateral PCA territory infarcts. ANTERIOR CIRCULATION: --Intracranial internal carotid arteries: Normal. --Anterior cerebral arteries (ACA): Normal. Both A1 segments are present. Patent anterior communicating artery (a-comm). --Middle cerebral arteries (MCA): There is occlusion the left M2 inferior division midportion (series 9, image 98). The middle cerebral arteries are otherwise normal. VENOUS SINUSES: As permitted by contrast timing, patent. ANATOMIC VARIANTS: None Review of the MIP images confirms the above findings. CT Brain Perfusion Findings: ASPECTS: 10 CBF (<30%) Volume: 73mL Perfusion (Tmax>6.0s) volume: 105mL Mismatch Volume: 76mL Infarction Location:Posterior left MCA territory. IMPRESSION: 1. Occlusion of  the left middle cerebral artery inferior division mid M2 segment. 2. 67 mL area of ischemic penumbra demonstrated by CT perfusion analysis. Critical  Value/emergent results were called by telephone at the time of interpretation on 05/02/2019 at 1:07 am to [facility] , who verbally acknowledged these results. Electronically Signed   By: Deatra RobinsonKevin  Herman M.D.   On: 05/02/2019 01:26   Ct Head Code Stroke Wo Contrast  Result Date: 05/02/2019 CLINICAL DATA:  Code stroke. Right-sided weakness. Last seen normal 11 p.m. EXAM: CT HEAD WITHOUT CONTRAST TECHNIQUE: Contiguous axial images were obtained from the base of the skull through the vertex without intravenous contrast. COMPARISON:  None. FINDINGS: Brain: There is no mass, hemorrhage or extra-axial collection. There are old bilateral PCA territory infarcts. There is also an old ventrolateral right thalamic infarct. No acute infarct is evident. Vascular: No abnormal hyperdensity of the major intracranial arteries or dural venous sinuses. No intracranial atherosclerosis. Skull: The visualized skull base, calvarium and extracranial soft tissues are normal. Sinuses/Orbits: No fluid levels or advanced mucosal thickening of the visualized paranasal sinuses. No mastoid or middle ear effusion. The orbits are normal. ASPECTS [facility]([place] Stroke Program Early CT Score) - Ganglionic level infarction (caudate, lentiform nuclei, internal capsule, insula, M1-M3 cortex): 7 - Supraganglionic infarction (M4-M6 cortex): 3 Total score (0-10 with 10 being normal): 10 IMPRESSION: 1. No acute intracranial abnormality. 2. 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 *ASPECTS is 10 * These results were communicated to Dr. Georgiana SpinnerSushanth Aroor at 12:50 am on 05/02/2019 by text page via the [facility] messaging system. Electronically Signed   By: Deatra RobinsonKevin  Herman M.D.   On: 05/02/2019 00:50     ASSESSMENT AND PLAN  43 y.o. male with past medical history of atrial fibrillation and cardiac  thrombus on Xarelto, CHF with low EF in the past, prior stroke presents with aphasia due to left M2 occlusion.  Patient did not receive TPA due to being on Xarelto.  Patient taken to IR for mechanical thrombectomy.  Mechanical thrombectomy complicated with difficult recanalization and vasospasm  and subsequent postprocedural subarachnoid hemorrhage.  Patient remained intubated after the procedure.   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 status post mechanical thrombectomy with TICI 2B recanalization  Plan # MRI of the brain without contrast #Transthoracic Echo  #Hold antiplatelets/anticoagulation due to subarachnoid hemorrhage #Start or continue Atorvastatin 40 mg/other high intensity statin after swallow eval # BP goal: 120 -140 systolic BP # HBAIC and Lipid profile # Telemetry monitoring # Frequent neuro checks  Acute respiratory failure secondary to left MCA stroke and IR procedure complication -Appreciate PCCM assistance  Congestive heart failure -Prior echo EF of 20 to 25% -Appreciate PCCM assistance on pressors management  H/O LV thrombus -Patient on anticoagulation with Xarelto, [facility]  Atrial fibrillation -High CHA2DS2-VASc score, hold anticoagulation for now -As needed metoprolol  Coagulopathy -Elevated  APTT and INR, out of proportion to what is expected on patient on Xarelto -Consider hypercoagulable work-up   Patient is full code DVT prophylaxis: SCD    This patient is neurologically critically ill due to left MCA stroke status post mechanical thrombectomy with subsequent subarachnoid hemorrhage in the setting of patient on Xarelto and coagulopathy.  He is at risk for significant risk of neurological worsening from cerebral edema,  death from brain herniation, heart failure, hemorrhagic conversion, infection, respiratory failure and seizure. This patient's care requires constant monitoring of vital signs, hemodynamics, respiratory and  cardiac monitoring, review of multiple databases, neurological assessment, discussion with family, other specialists and medical decision making of high complexity.  I spent 75 minutes of neurocritical time in the care  of this patient.     Sushanth Aroor [facility] Pager Number [phone]

## 2019-05-02 NOTE — Progress Notes (Signed)
PT Cancellation Note  Patient Details Name: Logan [name]. MRN: [id number] DOB: 20-May-1976   Cancelled Treatment:    Reason Eval/Treat Not Completed: Medical issues which prohibited therapy.  Pt with femoral sheath and on the vent.  Will try to see as able 10/30. 05/02/2019  Logan [name], PT Acute Rehabilitation Services [phone]  (pager) [phone]  (office)   Logan [name] 05/02/2019, 12:31 PM

## 2019-05-02 NOTE — Progress Notes (Signed)
Pt has [place] will have to wait until 10/30 to setup time with Radiology Nurses so they can program pt ICD to MRI mode for exam.  RN aware that time should be established in the AM so programming and Respiratory can be planned accordingly.

## 2019-05-02 NOTE — Code Documentation (Signed)
Code stroke called 0011. Per family, LSW 54 believed to have fell at home and found at 70. Initial NIH 9 for LOC, commands, Aphasia and dysarthria . Found to have a M2 occlusion on xarelto. Notified for IR team 0115.  Foley placed, bilateral groin prepped, pulses marked. Anesthesia notified at [place], intubated 0158, arrived in [place]. Consent signed, report given to IR team.

## 2019-05-02 NOTE — Progress Notes (Signed)
eLink Physician-Brief Progress Note Patient Name: Logan [name]. 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  He was seen normal at 11pm and found down on the floor at midnight.    CTA showed occlusion of the left middle cerebral artery.  Pt is s/p common carotid arteriogram with recanalization of the left MCA. Pt let intubated after the procedure.  eICU Interventions  MCA stroke CAD s/p CABG SAH Acute respiratory failure  Target BP 120-140. PT currently on levophed.  Check ABG, CXR for ETT placement.  SCDs for DVT prophylaxis.  Protonix for GI prophylaxis.         Elsie Lincoln 05/02/2019, 4:35 AM

## 2019-05-02 NOTE — ED Notes (Signed)
Anethesia at bedside intubating

## 2019-05-02 NOTE — Consult Note (Signed)
NAME:  Wynton Hufstetler. MRN:  [id number] DOB:  1976/05/16 LOS: 0 ADMISSION DATE:  05/02/2019 DATE OF SERVICE:  05/02/2019  CHIEF COMPLAINT: Right-sided deficits, aphasia  PULMONARY/CRITICAL CARE CONSULT NOTE  History of Present Illness  This 43 y.o. Caucasian male smoker presented to the [facility] Emergency Department via EMS with complaints of right-sided motor deficits and aphasia.  His last known well time was 11 PM on 05/01/2019.  Review of the chart suggests that EMS reported improvement in symptoms en route to the hospital.  Initial EKG was suspicious for acute myocardial infarction and code STEMI was also called.  Code STEMI was canceled upon arrival.  Patient was taken emergently to CT for imaging of the head.  The patient was taken to the interventional radiology suite where Dr. Estanislado Pandy identified a left MCA (distal M2 inferior division) occlusion.  TICI 2c revascularization was achieved.  Review of available documentation reveals that post procedure CT of the brain revealed mild to moderate left perisylvian subarachnoid hemorrhage.  The patient was transferred to 4 N.  Critical care service was asked to admit.  At the time of clinical interview, the patient is intubated.  He is on mechanical ventilatory support.  He is currently off sedation and does move all 4 extremities, although he is not answering and following commands.    Ventilator PRVC 16/540/40%/5.  Nurse reports that the patient developed hypotension, prompting initiation of norepinephrine infusion.  On 2 mcg/min, the patient has a blood pressure 132/81.  Heart rate 90.  Investigation in the EMR reveals that the patient has another patient chart, marked for merge.  He has a complicated cardiac history, which involves coronary artery disease and multiple myocardial infarctions (drug-eluting stent LAD in 2013, reinfarction with in-stent thrombosis in 2013, 3-vv CABG in 7517), chronic systolic heart failure (2D  echo 04/08/2019: LVEF 20-25%; diffuse hypokinesis with mid and basal inferior wall akinesis; LA moderately dilated; severe mitral regurgitation; mild TR).   REVIEW OF SYSTEMS This patient is critically ill and cannot provide additional history nor review of systems due to mental status/unconsciousness, endotracheally intubated and unable to speak.   Past Medical/Surgical/Social/Family History  Chart merge pending.   Procedures:  10/29: Left common carotid arteriogram, partial recanalization of occluded left MCA distal M2 inferior division   Significant Diagnostic Tests:  10/29: Head CT   Micro Data:   Results for orders placed or performed during the hospital encounter of 05/02/19  SARS Coronavirus 2 by RT PCR (hospital order, performed in [facility] hospital lab) Nasopharyngeal Nasopharyngeal Swab     Status: None   Collection Time: 05/02/19  1:38 AM   Specimen: Nasopharyngeal Swab  Result Value Ref Range Status   SARS Coronavirus 2 NEGATIVE NEGATIVE Final    Comment: (NOTE) If result is NEGATIVE SARS-CoV-2 target nucleic acids are NOT DETECTED. The SARS-CoV-2 RNA is generally detectable in upper and lower  respiratory specimens during the acute phase of infection. The lowest  concentration of SARS-CoV-2 viral copies this assay can detect is 250  copies / mL. A negative result does not preclude SARS-CoV-2 infection  and should not be used as the sole basis for treatment or other  patient management decisions.  A negative result may occur with  improper specimen collection / handling, submission of specimen other  than nasopharyngeal swab, presence of viral mutation(s) within the  areas targeted by this assay, and inadequate number of viral copies  (<250 copies / mL). A negative result  must be combined with clinical  observations, patient history, and epidemiological information. If result is POSITIVE SARS-CoV-2 target nucleic acids are DETECTED. The SARS-CoV-2 RNA is  generally detectable in upper and lower  respiratory specimens dur ing the acute phase of infection.  Positive  results are indicative of active infection with SARS-CoV-2.  Clinical  correlation with patient history and other diagnostic information is  necessary to determine patient infection status.  Positive results do  not rule out bacterial infection or co-infection with other viruses. If result is PRESUMPTIVE POSTIVE SARS-CoV-2 nucleic acids MAY BE PRESENT.   A presumptive positive result was obtained on the submitted specimen  and confirmed on repeat testing.  While 2019 novel coronavirus  (SARS-CoV-2) nucleic acids may be present in the submitted sample  additional confirmatory testing may be necessary for epidemiological  and / or clinical management purposes  to differentiate between  SARS-CoV-2 and other Sarbecovirus currently known to infect humans.  If clinically indicated additional testing with an alternate test  methodology [phone]) is advised. The SARS-CoV-2 RNA is generally  detectable in upper and lower respiratory sp ecimens during the acute  phase of infection. The expected result is Negative. Fact Sheet for Patients:  StrictlyIdeas.no Fact Sheet for Healthcare Providers: BankingDealers.co.za This test is not yet approved or cleared by the [place] FDA and has been authorized for detection and/or diagnosis of SARS-CoV-2 by FDA under an Emergency Use Authorization (EUA).  This EUA will remain in effect (meaning this test can be used) for the duration of the COVID-19 declaration under Section 564(b)(1) of the Act, 21 U.S.C. section 360bbb-3(b)(1), unless the authorization is terminated or revoked sooner. Performed at [facility], [place] [address] [place], [place] [address]      Antimicrobials:      Interim history/subjective:     Objective   BP (!) 139/99 (BP Location: Right Arm)   Pulse (!) 104    Temp 99 F (37.2 C) (Oral)   Resp 14   Ht _0  (1.727 m)   Wt 66 kg   SpO2 99%   BMI 22.11 kg/m     Filed Weights   05/02/19 0000  Weight: 66 kg    Intake/Output Summary (Last 24 hours) at 05/02/2019 0521 Last data filed at 05/02/2019 0400 Gross per 24 hour  Intake 1000 ml  Output 975 ml  Net 25 ml    Vent Mode: PRVC FiO2 (%):  [40 %-100 %] 40 % Set Rate:  [16 bmp] 16 bmp Vt Set:  [540 mL] 540 mL PEEP:  [5 cmH20] 5 cmH20 Plateau Pressure:  [18 cmH20] 18 cmH20   Examination: GENERAL: Intubated. agitated, arousable to voice and noxious stimuli, uncooperative, inappropriate safety awareness or well-developed. No acute distress. HEAD: normocephalic, atraumatic EYE: PERRLA, EOM intact, no scleral icterus, no pallor. THROAT/ORAL CAVITY: Normal dentition. No oral thrush. No exudate. Mucous membranes are moist. No tonsillar enlargement. ETT in situ. NECK: supple, no thyromegaly, no JVD, no lymphadenopathy. Trachea midline. CHEST/LUNG: symmetric in development and expansion. Good air entry.  Rales and rhonchi bilaterally. HEART: Regular S1 and S2 3/6 systolic murmur best heard in the mitral area. ABDOMEN: soft, nontender, nondistended. Normoactive bowel sounds. No rebound. No guarding. No hepatosplenomegaly. EXTREMITIES: Edema: none. No cyanosis.  No clubbing. 2+ DP pulses LYMPHATIC: no cervical/axillary/inguinal lymph nodes appreciated MUSCULOSKELETAL: No point tenderness.  No bulk atrophy. Joints: Normal.  SKIN: No rash or lesion. NEUROLOGIC: Doll's eyes intact. Spontaneous respirations intact. Cranial nerves II-XII are grossly  symmetric and physiologic.  Right Babinski equivocal.  Left Babinski present.  Decreased pain sensation in the LUE. Motor: 5/5 @ RUE, 4/5 @ LUE, 5/5 @ RLL,  5/5 @ LLL.  DTR: 2+ @ R biceps, 2+ @ L biceps, 2+ @ R patellar,  2+ @ L patellar. No cerebellar signs. Gait was not assessed.   Resolved Hospital Problem list      Assessment & Plan:    ASSESSMENT/PLAN:  ASSESSMENT (included in the Hospital Problem List)  Active Problems:   Postprocedural hypotension   Subarachnoid hemorrhage (HCC)   Acute ischemic left MCA stroke (HCC)   Middle cerebral artery embolism, left   Ischemic cardiomyopathy   Elevated troponin   Anticoagulant long-term use   By systems: PULMONARY  Endotracheally intubated  COPD  Tobacco abuse DuoNeb/Pulmicort scheduled and albuterol PRN. Propofol/fentanyl for sedation.  Titrate for RASS -2.   CARDIOVASCULAR  Hypotension  Ischemic cardiomyopathy, reportedly on Xarelto at home  NSTEMI (suspicious for septal infarct): abnormal troponin with abnormal EKG (with new L axis deviation) Norepinephrine infusion. Titrate to keep SBP 120-140 (in light of stroke and SAH). Trend troponin. While his LBBB is clearly old, he has new L axis deviation (previously had R axis deviation) and T wave inversions and flattening in V1-V4. Heparin is contraindicated at this time. Hypotension contraindicates addition of beta-blockade. Check 2D echo (limited study for LVEF and wall motion in light of recent 2D echo on 04/08/2019).   RENAL  No acute issues   GASTROINTESTINAL  GI PROPHYLAXIS: Protonix   HEMATOLOGIC  Chronic anticoagulant therapy (Xarelto)  DVT PROPHYLAXIS: SCDs Chemoprophylaxis contraindicated with SAH. Case discussed with Dr. Lorraine Lax. Will not attempt reversal of coagulopathy at this time, pending MRI results.   INFECTIOUS  No acute issues Trach aspirate for Gram stain, C/S.   ENDOCRINE  No acute issues   NEUROLOGIC  Acute L MCA (distal M2 inferior division) ischemic stroke  Perisylvian subarachnoid hemorrhage Case discussed with Dr. Lorraine Lax.  Will plan on MRI in am.   My assessment, plan of care, findings, medications, side effects, etc. were discussed with: nurse and Dr. Lorraine Lax (Neurology).   Best practice:  Diet: NPO for now Pain/Anxiety/Delirium protocol (if indicated): YES VAP  protocol (if indicated): YES DVT prophylaxis: SCDs GI prophylaxis: Protonix Glucose control: per protocol Mobility/Activity: Bed Rest CODE STATUS:   Code Status: Full Code Family Communication:  No family at the bedside. Disposition: Keep in ICU.   Labs   CBC: Recent Labs  Lab 05/02/19 0033 05/02/19 0040  WBC 6.1  --   NEUTROABS 3.2  --   HGB 13.8 14.3  HCT 39.5 42.0  MCV 100.0  --   PLT 224  --     Basic Metabolic Panel: Recent Labs  Lab 05/02/19 0033 05/02/19 0040  NA 139 140  K 3.6 3.5  CL 106 102  CO2 22  --   GLUCOSE 96 90  BUN 11 11  CREATININE 1.23 1.10  CALCIUM 9.0  --    GFR: Estimated Creatinine Clearance: 80.8 mL/min (by C-G formula based on SCr of 1.1 mg/dL). Recent Labs  Lab 05/02/19 0033  WBC 6.1    Liver Function Tests: Recent Labs  Lab 05/02/19 0033  AST 29  ALT 26  ALKPHOS 109  BILITOT 0.8  PROT 6.5  ALBUMIN 3.8   No results for input(s): LIPASE, AMYLASE in the last 168 hours. No results for input(s): AMMONIA in the last 168 hours.  ABG    Component Value Date/Time  TCO2 20 (L) 05/02/2019 0040     Coagulation Profile: Recent Labs  Lab 05/02/19 0033  INR 3.4*    Cardiac Enzymes: No results for input(s): CKTOTAL, CKMB, CKMBINDEX, TROPONINI in the last 168 hours.  HbA1C: No results found for: HGBA1C  CBG: Recent Labs  Lab 05/02/19 0032  GLUCAP 86     Past Medical History  No past medical history on file.    Surgical History       Social History   Social History   Socioeconomic History  . Marital status: Single    Spouse name: Not on file  . Number of children: Not on file  . Years of education: Not on file  . Highest education level: Not on file  Occupational History  . Not on file  Social Needs  . Financial resource strain: Not on file  . Food insecurity    Worry: Not on file    Inability: Not on file  . Transportation needs    Medical: Not on file    Non-medical: Not on file  Tobacco  Use  . Smoking status: Not on file  Substance and Sexual Activity  . Alcohol use: Not on file  . Drug use: Not on file  . Sexual activity: Not on file  Lifestyle  . Physical activity    Days per week: Not on file    Minutes per session: Not on file  . Stress: Not on file  Relationships  . Social Herbalist on phone: Not on file    Gets together: Not on file    Attends religious service: Not on file    Active member of club or organization: Not on file    Attends meetings of clubs or organizations: Not on file    Relationship status: Not on file  Other Topics Concern  . Not on file  Social History Narrative  . Not on file      Family History   No family history on file. family history is not on file.    Allergies Not on File    Current Medications  Current Facility-Administered Medications:  .   stroke: mapping our early stages of recovery book, , Does not apply, Once, Aroor, Karena Addison R, MD .  0.9 %  sodium chloride infusion, , Intravenous, Continuous, Deveshwar, Sanjeev, MD .  acetaminophen (TYLENOL) tablet 650 mg, 650 mg, Oral, Q4H PRN **OR** acetaminophen (TYLENOL) 160 MG/5ML solution 650 mg, 650 mg, Per Tube, Q4H PRN **OR** acetaminophen (TYLENOL) suppository 650 mg, 650 mg, Rectal, Q4H PRN, Deveshwar, Sanjeev, MD .  ceFAZolin (ANCEF) 2-4 GM/100ML-% IVPB, , , ,  .  chlorhexidine gluconate (MEDLINE KIT) (PERIDEX) 0.12 % solution 15 mL, 15 mL, Mouth Rinse, BID, Aroor, Karena Addison R, MD .  Chlorhexidine Gluconate Cloth 2 % PADS 6 each, 6 each, Topical, Daily, Aroor, Karena Addison R, MD .  clevidipine (CLEVIPREX) infusion 0.5 mg/mL, 0-21 mg/hr, Intravenous, Continuous, Deveshwar, Sanjeev, MD .  MEDLINE mouth rinse, 15 mL, Mouth Rinse, 10 times per day, Aroor, Karena Addison R, MD .  nitroGLYCERIN 100 mcg/mL intra-arterial injection, , , ,  .  ondansetron (ZOFRAN) injection 4 mg, 4 mg, Intravenous, Q6H PRN, Deveshwar, Sanjeev, MD .  pantoprazole (PROTONIX) injection 40 mg, 40  mg, Intravenous, Q24H, Yap, Vanessa, MD .  sodium chloride flush (NS) 0.9 % injection 3 mL, 3 mL, Intravenous, Once, Cardama, Grayce Sessions, MD   Home Medications  Prior to Admission medications   Not on File  Critical care time: 60 minutes.  The treatment and management of the patient's condition was required based on the threat of imminent deterioration. This time reflects time spent by the physician evaluating, providing care and managing the critically ill patient's care. The time was spent at the immediate bedside (or on the same floor/unit and dedicated to this patient's care). Time involved in separately billable procedures is NOT included int he critical care time indicated above. Family meeting and update time may be included above if and only if the patient is unable/incompetent to participate in clinical interview and/or decision making, and the discussion was necessary to determining treatment decisions.   Renee Pain, MD Board Certified by the ABIM, [place] Pager: [phone]

## 2019-05-02 NOTE — Transfer of Care (Signed)
Immediate Anesthesia Transfer of Care Note  Patient: Logan [name].  Procedure(s) Performed: IR WITH ANESTHESIA (N/A )  Patient Location: ICU  Anesthesia Type:General  Level of Consciousness: sedated and Patient remains intubated per anesthesia plan  Airway & Oxygen Therapy: Patient remains intubated per anesthesia plan and Patient placed on Ventilator (see vital sign flow sheet for setting)  Post-op Assessment: Report given to RN  Post vital signs: Reviewed and stable  Last Vitals:  Vitals Value Taken Time  BP 139/99 05/02/19 0427  Temp 37.2 C 05/02/19 0426  Pulse 75 05/02/19 0435  Resp 16 05/02/19 0435  SpO2 100 % 05/02/19 0435  Vitals shown include unvalidated device data.  Last Pain:  Vitals:   05/02/19 0426  TempSrc: Oral         Complications: No apparent anesthesia complications

## 2019-05-02 NOTE — Anesthesia Preprocedure Evaluation (Signed)
Anesthesia Evaluation  Patient identified by MRN, date of birth, ID band Patient awake  Preop documentation limited or incomplete due to emergent nature of procedure.  Airway Mallampati: II  TM Distance: >3 FB Neck ROM: Full    Dental  (+) Dental Advisory Given   Pulmonary    breath sounds clear to auscultation       Cardiovascular  Rhythm:Regular Rate:Normal     Neuro/Psych Code stroke CVA    GI/Hepatic   Endo/Other    Renal/GU      Musculoskeletal   Abdominal   Peds  Hematology  (+) Blood dyscrasia, ,   Anesthesia Other Findings   Reproductive/Obstetrics                             Anesthesia Physical Anesthesia Plan  ASA: IV  Anesthesia Plan: General   Post-op Pain Management:    Induction: Intravenous and Rapid sequence  PONV Risk Score and Plan: 2 and Dexamethasone, Ondansetron and Treatment may vary due to age or medical condition  Airway Management Planned: Oral ETT  Additional Equipment: Arterial line  Intra-op Plan:   Post-operative Plan: Possible Post-op intubation/ventilation  Informed Consent: I have reviewed the patients History and Physical, chart, labs and discussed the procedure including the risks, benefits and alternatives for the proposed anesthesia with the patient or authorized representative who has indicated his/her understanding and acceptance.     Dental advisory given  Plan Discussed with: CRNA  Anesthesia Plan Comments:         Anesthesia Quick Evaluation

## 2019-05-02 NOTE — Progress Notes (Signed)
STROKE TEAM PROGRESS NOTE   INTERVAL HISTORY I have personally reviewed history of presenting illness with the patient`s chart, reviewed electronic medical records and imaging films in PACS.  He presented with sudden onset of aphasia and right hemiparesis and has history of A. fib and LV thrombus and is on long-term anticoagulation with Xarelto.  CT angiogram showed left M2 occlusion and perfusion scan showed favorable profile and he underwent successful mechanical thrombectomy with TICI 2b revascularization.  He developed oozing from the right groin following the procedure with bleeding appears to have stopped for now.  Hematocrit is stable at 36.  Blood pressure has been adequately controlled.  He remains sedated and intubated due to presence of right groin sheath since he had taken the last dose of Xarelto last night and his INR was falsely elevated to 3.4  Vitals:   05/02/19 0745 05/02/19 0746 05/02/19 0758 05/02/19 0800  BP: 102/68   132/84  Pulse: (!) 46 (!) 53  (!) 49  Resp: 16 16  17   Temp:    98.2 F (36.8 C)  TempSrc:    Oral  SpO2: 100% 100% 100% 100%  Weight:      Height:        CBC:  Recent Labs  Lab 05/02/19 0033  05/02/19 0500 05/02/19 0518 05/02/19 0736  WBC 6.1  --  6.2  --   --   NEUTROABS 3.2  --  4.6  --   --   HGB 13.8   < > 12.6* 12.2* 12.6*  HCT 39.5   < > 35.5* 36.0* 36.2*  MCV 100.0  --  100.0  --   --   PLT 224  --  215  --   --    < > = values in this interval not displayed.    Basic Metabolic Panel:  Recent Labs  Lab 05/02/19 0033 05/02/19 0040 05/02/19 0500 05/02/19 0518  NA 139 140 134* 138  K 3.6 3.5 3.8 4.8  CL 106 102 107  --   CO2 22  --  20*  --   GLUCOSE 96 90 136*  --   BUN 11 11 8   --   CREATININE 1.23 1.10 0.99  --   CALCIUM 9.0  --  7.9*  --    Lipid Panel:     Component Value Date/Time   CHOL 122 05/02/2019 0500   TRIG 34 05/02/2019 0500   HDL 31 (L) 05/02/2019 0500   CHOLHDL 3.9 05/02/2019 0500   VLDL 7 05/02/2019 0500    LDLCALC 84 05/02/2019 0500   HgbA1c:  Lab Results  Component Value Date   HGBA1C 5.4 05/02/2019   Urine Drug Screen:     Component Value Date/Time   LABOPIA NONE DETECTED 05/02/2019 0756   COCAINSCRNUR NONE DETECTED 05/02/2019 0756   LABBENZ NONE DETECTED 05/02/2019 0756   AMPHETMU NONE DETECTED 05/02/2019 0756   THCU NONE DETECTED 05/02/2019 0756   LABBARB NONE DETECTED 05/02/2019 0756    Alcohol Level No results found for: ETH  IMAGING Ct Angio Head W Or Wo Contrast  Result Date: 05/02/2019 EXAM: CT ANGIOGRAPHY HEAD AND NECK CT PERFUSION BRAIN TECHNIQUE: Multidetector CT imaging of the head and neck was performed using the standard protocol during bolus administration of intravenous contrast. Multiplanar CT image reconstructions and MIPs were obtained to evaluate the vascular anatomy. Carotid stenosis measurements (when applicable) are obtained utilizing NASCET criteria, using the distal internal carotid diameter as the denominator. Multiphase CT imaging of  the brain was performed following IV bolus contrast injection. Subsequent parametric perfusion maps were calculated using RAPID software. CONTRAST:  40mL OMNIPAQUE IOHEXOL 350 MG/ML SOLN; 80mL OMNIPAQUE IOHEXOL 350 MG/ML SOLN COMPARISON:  CTA head and neck September 18, 2018 FINDINGS: CTA NECK FINDINGS SKELETON: There is no bony spinal canal stenosis. No lytic or blastic lesion. OTHER NECK: Normal pharynx, larynx and major salivary glands. No cervical lymphadenopathy. Unremarkable thyroid gland. UPPER CHEST: No pneumothorax or pleural effusion. No nodules or masses. AORTIC ARCH: There is no calcific atherosclerosis of the aortic arch. There is no aneurysm, dissection or hemodynamically significant stenosis of the visualized portion of the aorta. Conventional 3 vessel aortic branching pattern. The visualized proximal subclavian arteries are widely patent. RIGHT CAROTID SYSTEM: No dissection, occlusion or aneurysm. Mild atherosclerotic  calcification at the carotid bifurcation without hemodynamically significant stenosis. LEFT CAROTID SYSTEM: No dissection, occlusion or aneurysm. Mild atherosclerotic calcification at the carotid bifurcation without hemodynamically significant stenosis. VERTEBRAL ARTERIES: Codominant configuration. Both origins are clearly patent. There is no dissection, occlusion or flow-limiting stenosis to the skull base (V1-V3 segments). CTA HEAD FINDINGS POSTERIOR CIRCULATION: --Vertebral arteries: Normal V4 segments. --Posterior inferior cerebellar arteries (PICA): Patent origins from the vertebral arteries. --Anterior inferior cerebellar arteries (AICA): Patent origins from the basilar artery. --Basilar artery: Normal. --Superior cerebellar arteries: Normal. --Posterior cerebral arteries: Both are diminutive, in keeping with remote bilateral PCA territory infarcts. ANTERIOR CIRCULATION: --Intracranial internal carotid arteries: Normal. --Anterior cerebral arteries (ACA): Normal. Both A1 segments are present. Patent anterior communicating artery (a-comm). --Middle cerebral arteries (MCA): There is occlusion the left M2 inferior division midportion (series 9, image 98). The middle cerebral arteries are otherwise normal. VENOUS SINUSES: As permitted by contrast timing, patent. ANATOMIC VARIANTS: None Review of the MIP images confirms the above findings. CT Brain Perfusion Findings: ASPECTS: 10 CBF (<30%) Volume: 0mL Perfusion (Tmax>6.0s) volume: 67mL Mismatch Volume: 67mL Infarction Location:Posterior left MCA territory. IMPRESSION: 1. Occlusion of the left middle cerebral artery inferior division mid M2 segment. 2. 67 mL area of ischemic penumbra demonstrated by CT perfusion analysis. Critical Value/emergent results were called by telephone at the time of interpretation on 05/02/2019 at 1:07 am to [facility]providerSUSHANTH AROOR , who verbally acknowledged these results. Electronically Signed   By: Deatra RobinsonKevin  Herman M.D.   On: 05/02/2019  01:26   Ct Angio Neck W Or Wo Contrast  Result Date: 05/02/2019 EXAM: CT ANGIOGRAPHY HEAD AND NECK CT PERFUSION BRAIN TECHNIQUE: Multidetector CT imaging of the head and neck was performed using the standard protocol during bolus administration of intravenous contrast. Multiplanar CT image reconstructions and MIPs were obtained to evaluate the vascular anatomy. Carotid stenosis measurements (when applicable) are obtained utilizing NASCET criteria, using the distal internal carotid diameter as the denominator. Multiphase CT imaging of the brain was performed following IV bolus contrast injection. Subsequent parametric perfusion maps were calculated using RAPID software. CONTRAST:  40mL OMNIPAQUE IOHEXOL 350 MG/ML SOLN; 80mL OMNIPAQUE IOHEXOL 350 MG/ML SOLN COMPARISON:  CTA head and neck September 18, 2018 FINDINGS: CTA NECK FINDINGS SKELETON: There is no bony spinal canal stenosis. No lytic or blastic lesion. OTHER NECK: Normal pharynx, larynx and major salivary glands. No cervical lymphadenopathy. Unremarkable thyroid gland. UPPER CHEST: No pneumothorax or pleural effusion. No nodules or masses. AORTIC ARCH: There is no calcific atherosclerosis of the aortic arch. There is no aneurysm, dissection or hemodynamically significant stenosis of the visualized portion of the aorta. Conventional 3 vessel aortic branching pattern. The visualized proximal subclavian arteries are widely patent. RIGHT  CAROTID SYSTEM: No dissection, occlusion or aneurysm. Mild atherosclerotic calcification at the carotid bifurcation without hemodynamically significant stenosis. LEFT CAROTID SYSTEM: No dissection, occlusion or aneurysm. Mild atherosclerotic calcification at the carotid bifurcation without hemodynamically significant stenosis. VERTEBRAL ARTERIES: Codominant configuration. Both origins are clearly patent. There is no dissection, occlusion or flow-limiting stenosis to the skull base (V1-V3 segments). CTA HEAD FINDINGS POSTERIOR  CIRCULATION: --Vertebral arteries: Normal V4 segments. --Posterior inferior cerebellar arteries (PICA): Patent origins from the vertebral arteries. --Anterior inferior cerebellar arteries (AICA): Patent origins from the basilar artery. --Basilar artery: Normal. --Superior cerebellar arteries: Normal. --Posterior cerebral arteries: Both are diminutive, in keeping with remote bilateral PCA territory infarcts. ANTERIOR CIRCULATION: --Intracranial internal carotid arteries: Normal. --Anterior cerebral arteries (ACA): Normal. Both A1 segments are present. Patent anterior communicating artery (a-comm). --Middle cerebral arteries (MCA): There is occlusion the left M2 inferior division midportion (series 9, image 98). The middle cerebral arteries are otherwise normal. VENOUS SINUSES: As permitted by contrast timing, patent. ANATOMIC VARIANTS: None Review of the MIP images confirms the above findings. CT Brain Perfusion Findings: ASPECTS: 10 CBF (<30%) Volume: 39mL Perfusion (Tmax>6.0s) volume: 57mL Mismatch Volume: 75mL Infarction Location:Posterior left MCA territory. IMPRESSION: 1. Occlusion of the left middle cerebral artery inferior division mid M2 segment. 2. 67 mL area of ischemic penumbra demonstrated by CT perfusion analysis. Critical Value/emergent results were called by telephone at the time of interpretation on 05/02/2019 at 1:07 am to [facility] , who verbally acknowledged these results. Electronically Signed   By: Ulyses Jarred M.D.   On: 05/02/2019 01:26   Ct Cerebral Perfusion W Contrast  Result Date: 05/02/2019 EXAM: CT ANGIOGRAPHY HEAD AND NECK CT PERFUSION BRAIN TECHNIQUE: Multidetector CT imaging of the head and neck was performed using the standard protocol during bolus administration of intravenous contrast. Multiplanar CT image reconstructions and MIPs were obtained to evaluate the vascular anatomy. Carotid stenosis measurements (when applicable) are obtained utilizing NASCET criteria,  using the distal internal carotid diameter as the denominator. Multiphase CT imaging of the brain was performed following IV bolus contrast injection. Subsequent parametric perfusion maps were calculated using RAPID software. CONTRAST:  51mL OMNIPAQUE IOHEXOL 350 MG/ML SOLN; 80mL OMNIPAQUE IOHEXOL 350 MG/ML SOLN COMPARISON:  CTA head and neck September 18, 2018 FINDINGS: CTA NECK FINDINGS SKELETON: There is no bony spinal canal stenosis. No lytic or blastic lesion. OTHER NECK: Normal pharynx, larynx and major salivary glands. No cervical lymphadenopathy. Unremarkable thyroid gland. UPPER CHEST: No pneumothorax or pleural effusion. No nodules or masses. AORTIC ARCH: There is no calcific atherosclerosis of the aortic arch. There is no aneurysm, dissection or hemodynamically significant stenosis of the visualized portion of the aorta. Conventional 3 vessel aortic branching pattern. The visualized proximal subclavian arteries are widely patent. RIGHT CAROTID SYSTEM: No dissection, occlusion or aneurysm. Mild atherosclerotic calcification at the carotid bifurcation without hemodynamically significant stenosis. LEFT CAROTID SYSTEM: No dissection, occlusion or aneurysm. Mild atherosclerotic calcification at the carotid bifurcation without hemodynamically significant stenosis. VERTEBRAL ARTERIES: Codominant configuration. Both origins are clearly patent. There is no dissection, occlusion or flow-limiting stenosis to the skull base (V1-V3 segments). CTA HEAD FINDINGS POSTERIOR CIRCULATION: --Vertebral arteries: Normal V4 segments. --Posterior inferior cerebellar arteries (PICA): Patent origins from the vertebral arteries. --Anterior inferior cerebellar arteries (AICA): Patent origins from the basilar artery. --Basilar artery: Normal. --Superior cerebellar arteries: Normal. --Posterior cerebral arteries: Both are diminutive, in keeping with remote bilateral PCA territory infarcts. ANTERIOR CIRCULATION: --Intracranial internal  carotid arteries: Normal. --Anterior cerebral arteries (ACA): Normal. Both A1  segments are present. Patent anterior communicating artery (a-comm). --Middle cerebral arteries (MCA): There is occlusion the left M2 inferior division midportion (series 9, image 98). The middle cerebral arteries are otherwise normal. VENOUS SINUSES: As permitted by contrast timing, patent. ANATOMIC VARIANTS: None Review of the MIP images confirms the above findings. CT Brain Perfusion Findings: ASPECTS: 10 CBF (<30%) Volume: 41mL Perfusion (Tmax>6.0s) volume: 39mL Mismatch Volume: 48mL Infarction Location:Posterior left MCA territory. IMPRESSION: 1. Occlusion of the left middle cerebral artery inferior division mid M2 segment. 2. 67 mL area of ischemic penumbra demonstrated by CT perfusion analysis. Critical Value/emergent results were called by telephone at the time of interpretation on 05/02/2019 at 1:07 am to [facility] , who verbally acknowledged these results. Electronically Signed   By: Deatra Robinson M.D.   On: 05/02/2019 01:26   Ct Head Code Stroke Wo Contrast  Result Date: 05/02/2019 CLINICAL DATA:  Code stroke. Right-sided weakness. Last seen normal 11 p.m. EXAM: CT HEAD WITHOUT CONTRAST TECHNIQUE: Contiguous axial images were obtained from the base of the skull through the vertex without intravenous contrast. COMPARISON:  None. FINDINGS: Brain: There is no mass, hemorrhage or extra-axial collection. There are old bilateral PCA territory infarcts. There is also an old ventrolateral right thalamic infarct. No acute infarct is evident. Vascular: No abnormal hyperdensity of the major intracranial arteries or dural venous sinuses. No intracranial atherosclerosis. Skull: The visualized skull base, calvarium and extracranial soft tissues are normal. Sinuses/Orbits: No fluid levels or advanced mucosal thickening of the visualized paranasal sinuses. No mastoid or middle ear effusion. The orbits are normal. ASPECTS  [facility] Stroke Program Early CT Score) - Ganglionic level infarction (caudate, lentiform nuclei, internal capsule, insula, M1-M3 cortex): 7 - Supraganglionic infarction (M4-M6 cortex): 3 Total score (0-10 with 10 being normal): 10 IMPRESSION: 1. No acute intracranial abnormality. 2. 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 *ASPECTS is 10 * These results were communicated to Dr. Georgiana Spinner Aroor at 12:50 am on 05/02/2019 by text page via the [facility] messaging system. Electronically Signed   By: Deatra Robinson M.D.   On: 05/02/2019 00:50   Cerebral Angio 05/02/2019 S/P LT common carotid arteriogram followed by partial recanalization of occluded Lt MCA distal M2 inf division with x 1 pass wit solitairex 44mm x 40 mm X retrieval device and penumbra aspiration Achieving a TICI 2c revascularization.  2D Echocardiogram  1. No LV thrombus seen. There is akinesis of the septum into the apex concerning for prior infarction. Abnormal septal motion consistent with LBBB. Severely reduced LVEF, EF 15-20%. LVOT VTI 10.6 cm with estimated cardiac output 1.7 L/min (1.0  L/min/m2).  2. Left ventricular ejection fraction, by visual estimation, is <20%. The left ventricle has severely decreased function. There is no left ventricular hypertrophy.  3. Definity contrast agent was given IV to delineate the left ventricular endocardial borders.  4. Abnormal septal motion consistent with left bundle branch block.  5. Elevated left atrial pressure.  6. Left ventricular diastolic parameters are consistent with Grade III diastolic dysfunction (restrictive).  7. Severely dilated left ventricular internal cavity size.  8. The left ventricle demonstrates global hypokinesis.  9. Global right ventricle has moderately reduced systolic function.The right ventricular size is normal. No increase in right ventricular wall thickness. 10. Left atrial size was moderately dilated. 11. Mild mitral annular calcification. 12. The mitral  valve is grossly normal. Moderate mitral valve regurgitation. 13. Severe restricted movement of PMVL with at least moderate (maybe severe MR, but poorly quantitated). Findings consistent  with IIIb MR. 14. The tricuspid valve is grossly normal. Tricuspid valve regurgitation is trivial. 15. The aortic valve is tricuspid. Aortic valve regurgitation is not visualized. No evidence of aortic valve sclerosis or stenosis. 16. The pulmonic valve was grossly normal. Pulmonic valve regurgitation is not visualized. 17. The inferior vena cava IVC unable to be assessed due to ventilator therapy. 18. Right atrial size was normal.   PHYSICAL EXAM  Middle-aged Caucasian male who is sedated intubated.  He has right groin sheath. Sedated intubated. . Afebrile. Head is nontraumatic. Neck is supple without bruit.    Cardiac exam no murmur or gallop. Lungs are clear to auscultation. Distal pulses are well felt. Neurological Exam :  Sedated intubated. Eyes closed. Opens eyes partially to stimuli.Not following commands. Eyes primary position. Pupils small equal reactive. Doll`s eye movts sluggish. cornrals present. Cough gag present. Moves right side more than left to painful stimuli. DTR`s  ASSESSMENT/PLAN Mr. Logan PentonJack Tunney Jr. is a 43 y.o. male with history of AF and [facility], CHF, cardiomyopathy , prior stroke presenting nonverbal w/ R sided weakness. No tPA as on xarelto. 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 Old B PCA and R thalamic infarcts. ASPECTS 10.     CTA head & neck occlusion L MCA inferior M2 division.  CT perfusion 67 mL penumbnra  Cerebral angio TICI2c recanalization of occluded L M2 w/ solitaire and penumbra  Post IR CT mild to moderate L perisylvian SAH. L sheath left in d/t INR 3.4.  Repeat CT head unchanged SAH, no detectable MCA infarct, old posterior  circulation infarcts   MRI  pending   2D Echo EF 15-20%. No source of embolus   LDL 84  HgbA1c 5.4  SCDs for VTE prophylaxis  Xarelto (rivaroxaban) daily prior to admission, now on No antithrombotic post IR w/ SAH hemorrhage and elevated INR on xarelto PTA. Hold antithrombotic at this time.   Therapy recommendations:  pending   Disposition:  pending   Plan d/c sheath in am once xarelto out of his system. Repeat INR in am  Atrial Fibrillation H/o LA thrombus  Home anticoagulation:  Xarelto (rivaroxaban) daily   Last INR 3.4 - likely falsely elevated d/t xarelto. Discussed with pharmacy  Repeat INR in am   Respiratory Failure COPD  Intubated for IR, left intubated d/t high INR and sheath left in  Sedated   CCM onboard   Hypotension Hx Hypertension  Home meds:  Lasix 20, losartan 25 bid, spironolactone 25 BP goal per post IR protocol x 24h following IR On norepinephrine . Long-term BP goal normotensive  Hyperlipidemia  Home meds:  lipitor 80  LDL 84, goal < 70  Hold statin at this time given hemorrhage  Continue statin at discharge  Suspected NSTEMI  Elevated troponin  No antiplatelets/antithrombotics given SAH  No BB given multifactorial shock  Acute blood loss Anemia   R groin bleeding from sheath site, Also post IR  Hb 12.6  Dysphagia . Secondary to stroke . NPO . Speech to see once extubated   Other Stroke Risk Factors  Cigarette smoker  ETOH use  Hx Substance abuse marijuana, UDS:  THC NONE DETECTED  Hx stroke/TIA  12/2017 - TIA 5 weeks ago. back to baseline. Unable to get MRI due to PPM. on xarelto, continued  03/2017 -  Right superior cerebellar infarct - embolic - likely secondary to cardiomyopathy. Interventional Neuro Radiology. Complete revascularization of occluded RT PCA P1 seg with solitaire device and reestablishment if TICI 3 reperfusion.  01/2016 - Probable R brain TIA  Coronary artery disease s/p DES LAD 2013,  reinfarct w/ in-stent thrombus, s/p CABG 3v 2014  Chronic systolic Congestive heart failure, known EF 20-25%  Ischemic cardiomyopathy   History of ischemic cardiomyopathy and left ventricular mural thrombus in 2014.  Other Active Problems  History of medical noncompliance  Severe mitral regurg, mild TR  Hospital day # 0 I have personally obtained history,examined this patient, reviewed notes, independently viewed imaging studies, participated in medical decision making and plan of care.ROS completed by me personally and pertinent positives fully documented  I have made any additions or clarifications directly to the above note. Continue close neuro monitoring and strict BP control with SBP goal 120-140 x 24 hrs. Keep intubated till atleast 24 hrs until groin sheath can be removed in am. INRis falsely elevated and will repeat in am.D/W Dr Corliss Skains and Delton Coombes. Check MRI This patient is critically ill and at significant risk of neurological worsening, death and care requires constant monitoring of vital signs, hemodynamics,respiratory and cardiac monitoring, extensive review of multiple databases, frequent neurological assessment, discussion with family, other specialists and medical decision making of high complexity.I have made any additions or clarifications directly to the above note.This critical care time does not reflect procedure time, or teaching time or supervisory time of PA/NP/Med Resident etc but could involve care discussion time.  I spent 30 minutes of neurocritical care time  in the care of  this patient.      Delia Heady, MD Medical Director [facility] Stroke Center Pager: [phone] 05/03/2019 7:34 AM   To contact Stroke Continuity provider, please refer to [email or web address]. After hours, contact General Neurology

## 2019-05-02 NOTE — Progress Notes (Signed)
Transported pt to CT scan and back to 7C34 without complication.

## 2019-05-02 NOTE — Progress Notes (Signed)
SLP Cancellation Note  Patient Details Name: Logan [name]. MRN: [id number] DOB: 27-Dec-1975   Cancelled treatment:        Pt on vent. Will follow.    Houston Siren 05/02/2019, 12:04 PM   Orbie Pyo Colvin Caroli.Ed Risk analyst [phone] Office [phone]

## 2019-05-02 NOTE — Anesthesia Procedure Notes (Signed)
Arterial Line Insertion Start/End10/29/2020 2:10 AM, 05/02/2019 8:14 PM Performed by: Jearld Pies, CRNA, CRNA  Patient location: OOR procedure area. Emergency situation Left, radial was placed Catheter size: 20 G Hand hygiene performed  and Seldinger technique used Allen's test indicative of satisfactory collateral circulation Attempts: 1 Procedure performed without using ultrasound guided technique. Following insertion, dressing applied and Biopatch. Post procedure assessment: normal  Patient tolerated the procedure well with no immediate complications.

## 2019-05-02 NOTE — Consult Note (Signed)
INR. Post procedure  CT brain mild to mod Lt peri sylvian SAH. NO mass effect or shift noted.69F groin sheath left inplace because of INR of 3.4 . Distal pulses all dopplerable in both feet..Patient left intubated. S.Anesha Hackert MD

## 2019-05-02 NOTE — Progress Notes (Signed)
Referring Physician(s): Dr. Laurence Slate  Supervising Physician: Julieanne Cotton  Patient Status:  [facility] - In-pt  Chief Complaint: Acute CVA  Subjective: Remains intubated.  ECHO being completed at time of visit.  INR 3.4 on admission.  RN reports some movement of left side, none observed on right.  Angioseal, pressure dressing intact.   Allergies: Patient has no allergy information on record.  Medications: Prior to Admission medications   Not on File     Vital Signs: BP 103/68    Pulse (!) 55    Temp 98.2 F (36.8 C) (Oral)    Resp 16    Ht 5\' 8"  (1.727 m)    Wt 145 lb 6.4 oz (66 kg)    SpO2 100%    BMI 22.11 kg/m   Physical Exam  Intubated, sedated Neuro: deferred Groin: soft, pressure dressing in place.  Distal pulses: identified by doppler  Imaging: Ct Angio Head W Or Wo Contrast  Result Date: 05/02/2019 EXAM: CT ANGIOGRAPHY HEAD AND NECK CT PERFUSION BRAIN TECHNIQUE: Multidetector CT imaging of the head and neck was performed using the standard protocol during bolus administration of intravenous contrast. Multiplanar CT image reconstructions and MIPs were obtained to evaluate the vascular anatomy. Carotid stenosis measurements (when applicable) are obtained utilizing NASCET criteria, using the distal internal carotid diameter as the denominator. Multiphase CT imaging of the brain was performed following IV bolus contrast injection. Subsequent parametric perfusion maps were calculated using RAPID software. CONTRAST:  37mL OMNIPAQUE IOHEXOL 350 MG/ML SOLN; 60mL OMNIPAQUE IOHEXOL 350 MG/ML SOLN COMPARISON:  CTA head and neck September 18, 2018 FINDINGS: CTA NECK FINDINGS SKELETON: There is no bony spinal canal stenosis. No lytic or blastic lesion. OTHER NECK: Normal pharynx, larynx and major salivary glands. No cervical lymphadenopathy. Unremarkable thyroid gland. UPPER CHEST: No pneumothorax or pleural effusion. No nodules or masses. AORTIC ARCH: There is no calcific  atherosclerosis of the aortic arch. There is no aneurysm, dissection or hemodynamically significant stenosis of the visualized portion of the aorta. Conventional 3 vessel aortic branching pattern. The visualized proximal subclavian arteries are widely patent. RIGHT CAROTID SYSTEM: No dissection, occlusion or aneurysm. Mild atherosclerotic calcification at the carotid bifurcation without hemodynamically significant stenosis. LEFT CAROTID SYSTEM: No dissection, occlusion or aneurysm. Mild atherosclerotic calcification at the carotid bifurcation without hemodynamically significant stenosis. VERTEBRAL ARTERIES: Codominant configuration. Both origins are clearly patent. There is no dissection, occlusion or flow-limiting stenosis to the skull base (V1-V3 segments). CTA HEAD FINDINGS POSTERIOR CIRCULATION: --Vertebral arteries: Normal V4 segments. --Posterior inferior cerebellar arteries (PICA): Patent origins from the vertebral arteries. --Anterior inferior cerebellar arteries (AICA): Patent origins from the basilar artery. --Basilar artery: Normal. --Superior cerebellar arteries: Normal. --Posterior cerebral arteries: Both are diminutive, in keeping with remote bilateral PCA territory infarcts. ANTERIOR CIRCULATION: --Intracranial internal carotid arteries: Normal. --Anterior cerebral arteries (ACA): Normal. Both A1 segments are present. Patent anterior communicating artery (a-comm). --Middle cerebral arteries (MCA): There is occlusion the left M2 inferior division midportion (series 9, image 98). The middle cerebral arteries are otherwise normal. VENOUS SINUSES: As permitted by contrast timing, patent. ANATOMIC VARIANTS: None Review of the MIP images confirms the above findings. CT Brain Perfusion Findings: ASPECTS: 10 CBF (<30%) Volume: 35mL Perfusion (Tmax>6.0s) volume: 64mL Mismatch Volume: 47mL Infarction Location:Posterior left MCA territory. IMPRESSION: 1. Occlusion of the left middle cerebral artery inferior  division mid M2 segment. 2. 67 mL area of ischemic penumbra demonstrated by CT perfusion analysis. Critical Value/emergent results were called by telephone at the time  of interpretation on 05/02/2019 at 1:07 am to [facility] , who verbally acknowledged these results. Electronically Signed   By: Deatra Robinson M.D.   On: 05/02/2019 01:26   Ct Head Wo Contrast  Result Date: 05/02/2019 CLINICAL DATA:  Status post thrombectomy EXAM: CT HEAD WITHOUT CONTRAST TECHNIQUE: Contiguous axial images were obtained from the base of the skull through the vertex without intravenous contrast. COMPARISON:  CT and CTA from earlier today. Limited intraoperative CT from earlier today FINDINGS: Brain: Unchanged contrast or subarachnoid hemorrhage in the left sylvian fissure. No superimposed infarct is detected. Remote bilateral occipital and right thalamic infarcts. Remote right superior cerebellar artery infarct. No hydrocephalus or shift. Vascular: No focal arterial hyperdensity. Skull: Negative Sinuses/Orbits: Nasopharyngeal fluid in the setting of intubation IMPRESSION: 1. Unchanged subarachnoid blood and/or contrast in the left sylvian fissure when compared to intraoperative CT. 2. No detectable MCA infarct. 3. Remote posterior circulation infarcts. Electronically Signed   By: Marnee Spring M.D.   On: 05/02/2019 09:37   Ct Angio Neck W Or Wo Contrast  Result Date: 05/02/2019 EXAM: CT ANGIOGRAPHY HEAD AND NECK CT PERFUSION BRAIN TECHNIQUE: Multidetector CT imaging of the head and neck was performed using the standard protocol during bolus administration of intravenous contrast. Multiplanar CT image reconstructions and MIPs were obtained to evaluate the vascular anatomy. Carotid stenosis measurements (when applicable) are obtained utilizing NASCET criteria, using the distal internal carotid diameter as the denominator. Multiphase CT imaging of the brain was performed following IV bolus contrast injection.  Subsequent parametric perfusion maps were calculated using RAPID software. CONTRAST:  65mL OMNIPAQUE IOHEXOL 350 MG/ML SOLN; 31mL OMNIPAQUE IOHEXOL 350 MG/ML SOLN COMPARISON:  CTA head and neck September 18, 2018 FINDINGS: CTA NECK FINDINGS SKELETON: There is no bony spinal canal stenosis. No lytic or blastic lesion. OTHER NECK: Normal pharynx, larynx and major salivary glands. No cervical lymphadenopathy. Unremarkable thyroid gland. UPPER CHEST: No pneumothorax or pleural effusion. No nodules or masses. AORTIC ARCH: There is no calcific atherosclerosis of the aortic arch. There is no aneurysm, dissection or hemodynamically significant stenosis of the visualized portion of the aorta. Conventional 3 vessel aortic branching pattern. The visualized proximal subclavian arteries are widely patent. RIGHT CAROTID SYSTEM: No dissection, occlusion or aneurysm. Mild atherosclerotic calcification at the carotid bifurcation without hemodynamically significant stenosis. LEFT CAROTID SYSTEM: No dissection, occlusion or aneurysm. Mild atherosclerotic calcification at the carotid bifurcation without hemodynamically significant stenosis. VERTEBRAL ARTERIES: Codominant configuration. Both origins are clearly patent. There is no dissection, occlusion or flow-limiting stenosis to the skull base (V1-V3 segments). CTA HEAD FINDINGS POSTERIOR CIRCULATION: --Vertebral arteries: Normal V4 segments. --Posterior inferior cerebellar arteries (PICA): Patent origins from the vertebral arteries. --Anterior inferior cerebellar arteries (AICA): Patent origins from the basilar artery. --Basilar artery: Normal. --Superior cerebellar arteries: Normal. --Posterior cerebral arteries: Both are diminutive, in keeping with remote bilateral PCA territory infarcts. ANTERIOR CIRCULATION: --Intracranial internal carotid arteries: Normal. --Anterior cerebral arteries (ACA): Normal. Both A1 segments are present. Patent anterior communicating artery (a-comm).  --Middle cerebral arteries (MCA): There is occlusion the left M2 inferior division midportion (series 9, image 98). The middle cerebral arteries are otherwise normal. VENOUS SINUSES: As permitted by contrast timing, patent. ANATOMIC VARIANTS: None Review of the MIP images confirms the above findings. CT Brain Perfusion Findings: ASPECTS: 10 CBF (<30%) Volume: 42mL Perfusion (Tmax>6.0s) volume: 43mL Mismatch Volume: 82mL Infarction Location:Posterior left MCA territory. IMPRESSION: 1. Occlusion of the left middle cerebral artery inferior division mid M2 segment. 2. 67 mL area of  ischemic penumbra demonstrated by CT perfusion analysis. Critical Value/emergent results were called by telephone at the time of interpretation on 05/02/2019 at 1:07 am to [facility]USHANTH AROOR , who verbally acknowledged these results. Electronically Signed   By: Deatra RobinsonKevin  Herman M.D.   On: 05/02/2019 01:26   Ct Cerebral Perfusion W Contrast  Result Date: 05/02/2019 EXAM: CT ANGIOGRAPHY HEAD AND NECK CT PERFUSION BRAIN TECHNIQUE: Multidetector CT imaging of the head and neck was performed using the standard protocol during bolus administration of intravenous contrast. Multiplanar CT image reconstructions and MIPs were obtained to evaluate the vascular anatomy. Carotid stenosis measurements (when applicable) are obtained utilizing NASCET criteria, using the distal internal carotid diameter as the denominator. Multiphase CT imaging of the brain was performed following IV bolus contrast injection. Subsequent parametric perfusion maps were calculated using RAPID software. CONTRAST:  40mL OMNIPAQUE IOHEXOL 350 MG/ML SOLN; 80mL OMNIPAQUE IOHEXOL 350 MG/ML SOLN COMPARISON:  CTA head and neck September 18, 2018 FINDINGS: CTA NECK FINDINGS SKELETON: There is no bony spinal canal stenosis. No lytic or blastic lesion. OTHER NECK: Normal pharynx, larynx and major salivary glands. No cervical lymphadenopathy. Unremarkable thyroid gland. UPPER CHEST: No  pneumothorax or pleural effusion. No nodules or masses. AORTIC ARCH: There is no calcific atherosclerosis of the aortic arch. There is no aneurysm, dissection or hemodynamically significant stenosis of the visualized portion of the aorta. Conventional 3 vessel aortic branching pattern. The visualized proximal subclavian arteries are widely patent. RIGHT CAROTID SYSTEM: No dissection, occlusion or aneurysm. Mild atherosclerotic calcification at the carotid bifurcation without hemodynamically significant stenosis. LEFT CAROTID SYSTEM: No dissection, occlusion or aneurysm. Mild atherosclerotic calcification at the carotid bifurcation without hemodynamically significant stenosis. VERTEBRAL ARTERIES: Codominant configuration. Both origins are clearly patent. There is no dissection, occlusion or flow-limiting stenosis to the skull base (V1-V3 segments). CTA HEAD FINDINGS POSTERIOR CIRCULATION: --Vertebral arteries: Normal V4 segments. --Posterior inferior cerebellar arteries (PICA): Patent origins from the vertebral arteries. --Anterior inferior cerebellar arteries (AICA): Patent origins from the basilar artery. --Basilar artery: Normal. --Superior cerebellar arteries: Normal. --Posterior cerebral arteries: Both are diminutive, in keeping with remote bilateral PCA territory infarcts. ANTERIOR CIRCULATION: --Intracranial internal carotid arteries: Normal. --Anterior cerebral arteries (ACA): Normal. Both A1 segments are present. Patent anterior communicating artery (a-comm). --Middle cerebral arteries (MCA): There is occlusion the left M2 inferior division midportion (series 9, image 98). The middle cerebral arteries are otherwise normal. VENOUS SINUSES: As permitted by contrast timing, patent. ANATOMIC VARIANTS: None Review of the MIP images confirms the above findings. CT Brain Perfusion Findings: ASPECTS: 10 CBF (<30%) Volume: 0mL Perfusion (Tmax>6.0s) volume: 67mL Mismatch Volume: 67mL Infarction Location:Posterior left  MCA territory. IMPRESSION: 1. Occlusion of the left middle cerebral artery inferior division mid M2 segment. 2. 67 mL area of ischemic penumbra demonstrated by CT perfusion analysis. Critical Value/emergent results were called by telephone at the time of interpretation on 05/02/2019 at 1:07 am to [facility] AR[facility] , who verbally acknowledged these results. Electronically Signed   By: Deatra RobinsonKevin  Herman M.D.   On: 05/02/2019 01:26   Dg Chest Port 1 View  Result Date: 05/02/2019 CLINICAL DATA:  Endotracheal tube. EXAM: PORTABLE CHEST 1 VIEW COMPARISON:  April 22, 2019. FINDINGS: Stable cardiomegaly. Endotracheal tube is unchanged in position. No pneumothorax is noted. Left lung is clear. Mild right basilar atelectasis or infiltrate is noted. Bony thorax is unremarkable. IMPRESSION: Endotracheal tube in grossly good position. Mild right basilar atelectasis or infiltrate is noted. Electronically Signed   By: Zenda AlpersJames  Green Jr M.D.  On: 05/02/2019 09:08   Dg Abd Portable 1 View  Result Date: 05/02/2019 CLINICAL DATA:  NG tube placement. EXAM: PORTABLE ABDOMEN - 1 VIEW COMPARISON:  None. FINDINGS: The NG tube tip is in the distal esophagus and needs to be advanced several cm. The heart is mildly enlarged. The lung bases are grossly clear. The abdominal bowel gas pattern is unremarkable. IMPRESSION: The NG tube tip is in the distal esophagus and needs to be advanced several cm. Electronically Signed   By: Rudie MeyerP.  Gallerani M.D.   On: 05/02/2019 10:45   Ct Head Code Stroke Wo Contrast  Result Date: 05/02/2019 CLINICAL DATA:  Code stroke. Right-sided weakness. Last seen normal 11 p.m. EXAM: CT HEAD WITHOUT CONTRAST TECHNIQUE: Contiguous axial images were obtained from the base of the skull through the vertex without intravenous contrast. COMPARISON:  None. FINDINGS: Brain: There is no mass, hemorrhage or extra-axial collection. There are old bilateral PCA territory infarcts. There is also an old ventrolateral  right thalamic infarct. No acute infarct is evident. Vascular: No abnormal hyperdensity of the major intracranial arteries or dural venous sinuses. No intracranial atherosclerosis. Skull: The visualized skull base, calvarium and extracranial soft tissues are normal. Sinuses/Orbits: No fluid levels or advanced mucosal thickening of the visualized paranasal sinuses. No mastoid or middle ear effusion. The orbits are normal. ASPECTS [facility]([place] Stroke Program Early CT Score) - Ganglionic level infarction (caudate, lentiform nuclei, internal capsule, insula, M1-M3 cortex): 7 - Supraganglionic infarction (M4-M6 cortex): 3 Total score (0-10 with 10 being normal): 10 IMPRESSION: 1. No acute intracranial abnormality. 2. 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 *ASPECTS is 10 * These results were communicated to Dr. Georgiana SpinnerSushanth Aroor at 12:50 am on 05/02/2019 by text page via the [facility] messaging system. Electronically Signed   By: Deatra RobinsonKevin  Herman M.D.   On: 05/02/2019 00:50    Labs:  CBC: Recent Labs    05/02/19 0033 05/02/19 0040 05/02/19 0500 05/02/19 0518 05/02/19 0736  WBC 6.1  --  6.2  --   --   HGB 13.8 14.3 12.6* 12.2* 12.6*  HCT 39.5 42.0 35.5* 36.0* 36.2*  PLT 224  --  215  --   --     COAGS: Recent Labs    05/02/19 0033  INR 3.4*  APTT 45*    BMP: Recent Labs    05/02/19 0033 05/02/19 0040 05/02/19 0500 05/02/19 0518  NA 139 140 134* 138  K 3.6 3.5 3.8 4.8  CL 106 102 107  --   CO2 22  --  20*  --   GLUCOSE 96 90 136*  --   BUN 11 11 8   --   CALCIUM 9.0  --  7.9*  --   CREATININE 1.23 1.10 0.99  --   GFRNONAA >60  --  >60  --   GFRAA >60  --  >60  --     LIVER FUNCTION TESTS: Recent Labs    05/02/19 0033  BILITOT 0.8  AST 29  ALT 26  ALKPHOS 109  PROT 6.5  ALBUMIN 3.8    Assessment and Plan: 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  Also reported by daughter that he has a history of prior stroke with residual dysarthria.  Presented with right-sided weakness and asphasia.  Neuro exam  deferred at present.  Will need repeat INR.  Currently no Plavix/aspirin due to elevated INR.  Groin stable. Monitor.  Electronically Signed: Docia Barrier, PA 05/02/2019, 11:11 AM   I spent a total of 15 Minutes at the the patient's bedside AND on the patient's hospital floor or unit, greater than 50% of which was counseling/coordinating care for L MCA CVA.

## 2019-05-02 NOTE — Consult Note (Signed)
INR. 45 Y RT H M LSN 11pm New onset global aphasia and RT sided weakness. MRSS of 0 -1. CT brain NO ICH ASPECTS 17 OLd RT PCA infarct. CTA occluded  LT MCA inf division distal M2 region. CTP maps no core with penumbra of 67 ml. Endovascular treatment D/W aunt in a 3 way telephone call with aunt ,myself and Dr Lorraine Lax.   . Procedure ,reasons and alternatives discussed. Risks of ICH of 10 % ,neurological decline ,death inability to revascularize and vascular injury reviewed. Aunt expressed understanding and provided consent for the treatment. S.Anisten Tomassi  MD

## 2019-05-02 NOTE — Anesthesia Postprocedure Evaluation (Signed)
Anesthesia Post Note  Patient: Logan [name].  Procedure(s) Performed: IR WITH ANESTHESIA (N/A )     Patient location during evaluation: SICU Anesthesia Type: General Level of consciousness: sedated Pain management: pain level controlled Vital Signs Assessment: post-procedure vital signs reviewed and stable Respiratory status: patient remains intubated per anesthesia plan Cardiovascular status: stable Postop Assessment: no apparent nausea or vomiting Anesthetic complications: no    Last Vitals:  Vitals:   05/02/19 0630 05/02/19 0645  BP: 104/74 101/71  Pulse: (!) 58 (!) 56  Resp: 16 16  Temp:    SpO2: 99% 99%    Last Pain:  Vitals:   05/02/19 0426  TempSrc: Oral                 Tiajuana Amass

## 2019-05-02 NOTE — Plan of Care (Signed)
Pt started tube feeds @ 33ml/hr.

## 2019-05-02 NOTE — Procedures (Signed)
S/P LT common carotid arteriogram followed by partial recanalization of occluded Lt MCA distal M2 inf division with x 1 pass wit solitairex 72mm x 40 mm X retrieval device and penumbra aspiration Achieving a TICI 2c revascularization.

## 2019-05-02 NOTE — Progress Notes (Addendum)
NAME:  Logan [name]. MRN:  [id number] DOB:  1975-08-10 LOS: 0 ADMISSION DATE:  05/02/2019 DATE OF SERVICE:  05/02/2019  CHIEF COMPLAINT: Right-sided deficits, aphasia  PULMONARY/CRITICAL CARE CONSULT NOTE  History of Present Illness   43 year old male patient with significant history of cardiomyopathy, EF 20 to 25% admitted on 10/28 with new right-sided weakness and aphasia diagnostic evaluation consistent with left MCA stroke, this is been complicated by what appears to be possible non-ST elevation MI, post revascularization subarachnoid hemorrhage , And what appeared to be drug/sedation related hypotension  Sedation  Past Medical/Surgical/Social/Family History   Atrial fib, h/o left atrial thrombus (on xarelto), chronic systolic HF (EF 78-46%), CAD, prior small strokes  Procedures:  10/29: Left common carotid arteriogram, partial recanalization of occluded left MCA distal M2 inferior division 10/29 ECHO>>>  Significant Diagnostic Tests:  10/29: Head CT: No acute intracranial abnormality, old bilateral PCA and right pelvic infarcts CT angiogram  10/29: Occlusion of the left middle cerebral artery inferior division at mid M2 segment area of ischemic penumbra demonstrated by CT perfusion Postprocedural CT brain  10/29: Mild to moderate left perisylvian subarachnoid hemorrhage without mass-effect or shift ECHO 10/29>>> MRI brain 10/29>>>  Micro Data:  covid 19 10/29: neg   Antimicrobials:      Interim history/subjective:  Arterial sheath site had been oozing earlier this morning, hemostasis appears to have been achieved   Objective   Blood Pressure 132/84 (BP Location: Right Arm)   Pulse (Abnormal) 49   Temperature 98.2 F (36.8 C) (Oral)   Respiration 17   Height 5\' 8"  (1.727 m)   Weight 66 kg   Oxygen Saturation 100%   Body Mass Index 22.11 kg/m     Filed Weights   05/02/19 0000  Weight: 66 kg    Intake/Output Summary (Last 24 hours) at 05/02/2019 0843  Last data filed at 05/02/2019 0800 Gross per 24 hour  Intake 1147.9 ml  Output 1400 ml  Net -252.1 ml    Vent Mode: PRVC FiO2 (%):  [40 %-100 %] 40 % Set Rate:  [16 bmp] 16 bmp Vt Set:  [540 mL] 540 mL PEEP:  [5 cmH20] 5 cmH20 Plateau Pressure:  [17 cmH20-18 cmH20] 17 cmH20   Examination:  General: 43 year old white male currently sedated on propofol infusion HEENT normocephalic atraumatic pupils pinpoint but reactive orally intubated no clear JVD Pulmonary: Clear to auscultation diminished bases no accessory use. Cardiac: Bradycardic, no murmur rub or gallop appreciated Abdomen: Soft nontender no organomegaly positive bowel sounds Extremities: Warm and dry, right groin dressing intact does have some bloody drainage still.  Requires Doppler to palpate pulses Neuro: Sedated on mechanical ventilator withdraws to pain.  Right-sided hemiparesis pupils pinpoint  Resolved Hospital Problem list      Assessment & Plan:  Acute MCA stroke c/b post-procedural SAH s/p revascularization in IR Plan Awaiting f/u CT imaging SBP goal 120-140 (trying to balance post-procedural bleeding but assuring adequate perfusion to brain) Serial neuro checks Awaiting stroke team rounds. May need HT saline.  Additional recs per neuro  Acute respiratory failure w/ ventilator dependence 2/2 stroke and subsequent SAH -h/o tobacco abuse  pcxr mild right basilar atx. ETT good position. Tube/line appearing artifact? Not sure what it is; ending in mid esophagus.  -abg reviewed Plan Cont full vent support VAP bundle PAD protocol; decide on sedation goals further based on CT brain findings F/u sputum culture   Bradycardia and Hypotension etiology not clear. ? meds Plan Titrate norepi for MAP >  65  Holding home meds No BB given hypotension  H/o ICM (EF 20-25%); atrial fib and LV thrombus (had been on xarelto) now w/ concern for new NSTEMI (increased trop I and EKG changes) Plan Cont tele Holding  anti-platelet and anticoagulation d/t SAH Avoid BB F/u echo Trend CEs Will need cards eventually but nothing for them to do at this moment  At risk for fluid and electrolyte imbalance Plan Trend chemistries   At risk for anemia -had sheath site bleeding. Seems as though hemostasis achieved.  Plan Cont groin site monitoring Serial CBCs  Coagulopathy  -INR 3.4  Plan Repeat INR Holding off on reversal for now pending f/u brain imaging  Best practice:  Diet: NPO for now Pain/Anxiety/Delirium protocol (if indicated): YES VAP protocol (if indicated): YES DVT prophylaxis: SCDs GI prophylaxis: Protonix Glucose control: per protocol Mobility/Activity: Bed Rest CODE STATUS:   Code Status: Full Code Family Communication:  No family at the bedside. Disposition: Keep in ICU. Remains critically ill due to life threatening MCA stroke c/b post-procedural SAH, shock, likely medication related but cardiogenic etiology not ruled out. Awaiting CT imaging. Will prob need central access. Anticipate he may need HT saline.   My cct 36 minutes.   Simonne Martinet ACNP-BC [facility] Pulmonary/Critical Care Pager # [phone] OR # [phone] if no answer      Critical care time:

## 2019-05-02 NOTE — Sedation Documentation (Signed)
SBAR given to [employer], Therapist, sports. Groin and pulses assessed together, unchanged - see flowsheet.

## 2019-05-02 NOTE — Sedation Documentation (Signed)
Spoke with Sherita in pt placement. No bed requested, but have a clean and ready 4N bed. Msg sent to Dr. Lorraine Lax to order admission ASAP.

## 2019-05-02 NOTE — ED Provider Notes (Addendum)
[facility] EMERGENCY DEPARTMENT Provider Note  CSN: [id number] Arrival date & time: 05/02/19 0031  Chief Complaint(s) No chief complaint on file.  HPI Logan [name]. is a 43 y.o. male who presents by EMS as a code stroke.  Patient developed right-sided deficits and aphasia.  Last seen normal 11 PM by family.  EMS reports the right-sided deficits are improving.  Patient still has aphasia.  EKG was suspicious and code STEMI was also called.  Code STEMI was canceled upon arrival.  Patient taken emergently to CT scanner.  Remainder of history, ROS, and physical exam limited due to patient's condition (aphasia). Additional information was obtained from EMS.   Level V Caveat.    HPI  Past Medical History No past medical history on file. There are no active problems to display for this patient.  Home Medication(s) Prior to Admission medications   Not on File                                                                                                                                    Past Surgical History ** The histories are not reviewed yet. Please review them in the "History" navigator section and refresh this SmartLink. Family History No family history on file.  Social History Social History   Tobacco Use   Smoking status: Not on file  Substance Use Topics   Alcohol use: Not on file   Drug use: Not on file   Allergies Patient has no allergy information on record.  Review of Systems Review of Systems  Unable to perform ROS: Other    Physical Exam Vital Signs  I have reviewed the triage vital signs Wt 66 kg  BP 101/67 HR 86 RR 10  Physical Exam Vitals signs reviewed.  Constitutional:      General: He is not in acute distress.    Appearance: He is well-developed. He is not diaphoretic.  HENT:     Head: Normocephalic and atraumatic.     Nose: Nose normal.  Eyes:     General: No scleral icterus.       Right eye: No discharge.          Left eye: No discharge.     Conjunctiva/sclera: Conjunctivae normal.     Pupils: Pupils are equal, round, and reactive to light.  Neck:     Musculoskeletal: Normal range of motion and neck supple.  Cardiovascular:     Rate and Rhythm: Normal rate and regular rhythm.     Heart sounds: No murmur. No friction rub. No gallop.   Pulmonary:     Effort: Pulmonary effort is normal. No respiratory distress.     Breath sounds: Normal breath sounds. No stridor. No rales.  Abdominal:     General: There is no distension.     Palpations: Abdomen is soft.     Tenderness: There is no abdominal tenderness.  Musculoskeletal:  General: No tenderness.  Skin:    General: Skin is warm and dry.     Findings: No erythema or rash.  Neurological:     Mental Status: He is alert.     Comments: Aphasic Moves all extremities. Detailed neuro exam by Neurology.     ED Results and Treatments Labs (all labs ordered are listed, but only abnormal results are displayed) Labs Reviewed  PROTIME-INR - Abnormal; Notable for the following components:      Result Value   Prothrombin Time 33.8 (*)    INR 3.4 (*)    All other components within normal limits  APTT - Abnormal; Notable for the following components:   aPTT 45 (*)    All other components within normal limits  CBC - Abnormal; Notable for the following components:   RBC 3.95 (*)    MCH 34.9 (*)    All other components within normal limits  HEPARIN LEVEL (UNFRACTIONATED) - Abnormal; Notable for the following components:   Heparin Unfractionated >2.20 (*)    All other components within normal limits  I-STAT CHEM 8, ED - Abnormal; Notable for the following components:   Calcium, Ion 1.08 (*)    TCO2 20 (*)    All other components within normal limits  TROPONIN I (HIGH SENSITIVITY) - Abnormal; Notable for the following components:   Troponin I (High Sensitivity) 51 (*)    All other components within normal limits  SARS CORONAVIRUS 2 BY RT  PCR (HOSPITAL ORDER, PERFORMED IN [facility])  DIFFERENTIAL  COMPREHENSIVE METABOLIC PANEL  CBG MONITORING, ED  TROPONIN I (HIGH SENSITIVITY)                                                                                                                         EKG  EKG Interpretation  Date/Time:    Ventricular Rate:    PR Interval:    QRS Duration:   QT Interval:    QTC Calculation:   R Axis:     Text Interpretation:        Radiology Ct Head Code Stroke Wo Contrast  Result Date: 05/02/2019 CLINICAL DATA:  Code stroke. Right-sided weakness. Last seen normal 11 p.m. EXAM: CT HEAD WITHOUT CONTRAST TECHNIQUE: Contiguous axial images were obtained from the base of the skull through the vertex without intravenous contrast. COMPARISON:  None. FINDINGS: Brain: There is no mass, hemorrhage or extra-axial collection. There are old bilateral PCA territory infarcts. There is also an old ventrolateral right thalamic infarct. No acute infarct is evident. Vascular: No abnormal hyperdensity of the major intracranial arteries or dural venous sinuses. No intracranial atherosclerosis. Skull: The visualized skull base, calvarium and extracranial soft tissues are normal. Sinuses/Orbits: No fluid levels or advanced mucosal thickening of the visualized paranasal sinuses. No mastoid or middle ear effusion. The orbits are normal. ASPECTS [facility] Stroke Program Early CT Score) - Ganglionic level infarction (caudate, lentiform nuclei, internal capsule, insula, M1-M3 cortex): 7 - Supraganglionic infarction (M4-M6  cortex): 3 Total score (0-10 with 10 being normal): 10 IMPRESSION: 1. No acute intracranial abnormality. 2. 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 *ASPECTS is 10 * These results were communicated to Dr. Karena Addison Aroor at 12:50 am on 05/02/2019 by text page via the [facility] messaging system. Electronically Signed   By: Ulyses Jarred M.D.   On: 05/02/2019 00:50    Pertinent labs & imaging  results that were available during my care of the patient were reviewed by me and considered in my medical decision making (see chart for details).  Medications Ordered in ED Medications  sodium chloride flush (NS) 0.9 % injection 3 mL (has no administration in time range)  nitroGLYCERIN 100 mcg/mL intra-arterial injection (has no administration in time range)  eptifibatide (INTEGRILIN) 20 MG/10ML injection (has no administration in time range)  ceFAZolin (ANCEF) 2-4 GM/100ML-% IVPB (has no administration in time range)  fentaNYL (SUBLIMAZE) 100 MCG/2ML injection (has no administration in time range)  iohexol (OMNIPAQUE) 350 MG/ML injection 80 mL (80 mLs Intravenous Contrast Given 05/02/19 0044)  iohexol (OMNIPAQUE) 350 MG/ML injection 40 mL (40 mLs Intravenous Contrast Given 05/02/19 0059)                                                                                                                                    Procedures Procedures CRITICAL CARE Performed by: Logan [name] Total critical care time: 30 minutes Critical care time was exclusive of separately billable procedures and treating other patients. Critical care was necessary to treat or prevent imminent or life-threatening deterioration. Critical care was time spent personally by me on the following activities: development of treatment plan with patient and/or surrogate as well as nursing, discussions with consultants, evaluation of patient's response to treatment, examination of patient, obtaining history from patient or surrogate, ordering and performing treatments and interventions, ordering and review of laboratory studies, ordering and review of radiographic studies, pulse oximetry and re-evaluation of patient's condition.   (including critical care time)  Medical Decision Making / ED Course I have reviewed the nursing notes for this encounter and the patient's prior records (if available in EHR or on provided  paperwork).   Logan [name]. was evaluated in Emergency Department on 05/02/2019 for the symptoms described in the history of present illness. He was evaluated in the context of the global COVID-19 pandemic, which necessitated consideration that the patient might be at risk for infection with the SARS-CoV-2 virus that causes COVID-19. Institutional protocols and algorithms that pertain to the evaluation of patients at risk for COVID-19 are in a state of rapid change based on information released by regulatory bodies including the CDC and federal and state organizations. These policies and algorithms were followed during the patient's care in the ED.  Code stroke taken emergently to CT scan. No TPA. Will go to IR for revascularization. Intubated by Anesthesia.      Final Clinical Impression(s) /  ED Diagnoses Final diagnoses:  Stroke [facility](HCC)      This chart was dictated using voice recognition software.  Despite best efforts to proofread,  errors can occur which can change the documentation meaning.   Nira Connardama, Snow Peoples Eduardo, MD 05/02/19 0214    Nira Connardama, Reade Trefz Eduardo, MD 05/19/19 [phone]

## 2019-05-02 NOTE — Anesthesia Procedure Notes (Signed)
Procedure Name: Intubation Date/Time: 05/02/2019 1:57 AM Performed by: Claris Che, CRNA Pre-anesthesia Checklist: Patient identified, Emergency Drugs available, Suction available, Patient being monitored and Timeout performed Patient Re-evaluated:Patient Re-evaluated prior to induction Oxygen Delivery Method: Ambu bag Preoxygenation: Pre-oxygenation with 100% oxygen Induction Type: IV induction, Rapid sequence and Cricoid Pressure applied Laryngoscope Size: Mac and 4 Grade View: Grade II Tube type: Oral Tube size: 8.0 mm Number of attempts: 1 Airway Equipment and Method: Stylet Placement Confirmation: ETT inserted through vocal cords under direct vision,  CO2 detector and breath sounds checked- equal and bilateral Secured at: 24 cm Tube secured with: Tape Dental Injury: Teeth and Oropharynx as per pre-operative assessment

## 2019-05-03 ENCOUNTER — Encounter (HOSPITAL_COMMUNITY): Payer: Self-pay | Admitting: Interventional Radiology

## 2019-05-03 ENCOUNTER — Inpatient Hospital Stay (HOSPITAL_COMMUNITY): Payer: Medicare Other

## 2019-05-03 DIAGNOSIS — J9601 Acute respiratory failure with hypoxia: Secondary | ICD-10-CM

## 2019-05-03 DIAGNOSIS — Z7901 Long term (current) use of anticoagulants: Secondary | ICD-10-CM | POA: Diagnosis not present

## 2019-05-03 DIAGNOSIS — R778 Other specified abnormalities of plasma proteins: Secondary | ICD-10-CM | POA: Diagnosis not present

## 2019-05-03 DIAGNOSIS — I63512 Cerebral infarction due to unspecified occlusion or stenosis of left middle cerebral artery: Secondary | ICD-10-CM | POA: Diagnosis not present

## 2019-05-03 LAB — COMPREHENSIVE METABOLIC PANEL
ALT: 18 U/L (ref 0–44)
AST: 19 U/L (ref 15–41)
Albumin: 3.3 g/dL — ABNORMAL LOW (ref 3.5–5.0)
Alkaline Phosphatase: 89 U/L (ref 38–126)
Anion gap: 11 (ref 5–15)
BUN: 10 mg/dL (ref 6–20)
CO2: 19 mmol/L — ABNORMAL LOW (ref 22–32)
Calcium: 8.5 mg/dL — ABNORMAL LOW (ref 8.9–10.3)
Chloride: 109 mmol/L (ref 98–111)
Creatinine, Ser: 0.76 mg/dL (ref 0.61–1.24)
GFR calc Af Amer: >60 mL/min (ref >60)
GFR calc non Af Amer: >60 mL/min (ref >60)
Glucose, Bld: 123 mg/dL — ABNORMAL HIGH (ref 70–99)
Potassium: 4 mmol/L (ref 3.5–5.1)
Sodium: 139 mmol/L (ref 135–145)
Total Bilirubin: 1.3 mg/dL — ABNORMAL HIGH (ref 0.3–1.2)
Total Protein: 5.7 g/dL — ABNORMAL LOW (ref 6.5–8.1)

## 2019-05-03 LAB — POCT I-STAT 7, (LYTES, BLD GAS, ICA,H+H)
Acid-base deficit: 3 mmol/L — ABNORMAL HIGH (ref 0.0–2.0)
Bicarbonate: 20.9 mmol/L (ref 20.0–28.0)
Calcium, Ion: 1.2 mmol/L (ref 1.15–1.40)
HCT: 34 % — ABNORMAL LOW (ref 39.0–52.0)
Hemoglobin: 11.6 g/dL — ABNORMAL LOW (ref 13.0–17.0)
O2 Saturation: 96 %
Patient temperature: 98
Potassium: 4.7 mmol/L (ref 3.5–5.1)
Sodium: 141 mmol/L (ref 135–145)
TCO2: 22 mmol/L (ref 22–32)
pCO2 arterial: 34.2 mmHg (ref 32.0–48.0)
pH, Arterial: 7.393 (ref 7.350–7.450)
pO2, Arterial: 78 mmHg — ABNORMAL LOW (ref 83.0–108.0)

## 2019-05-03 LAB — GLUCOSE, CAPILLARY
Glucose-Capillary: 131 mg/dL — ABNORMAL HIGH (ref 70–99)
Glucose-Capillary: 143 mg/dL — ABNORMAL HIGH (ref 70–99)
Glucose-Capillary: 100 mg/dL — ABNORMAL HIGH (ref 70–99)
Glucose-Capillary: 110 mg/dL — ABNORMAL HIGH (ref 70–99)
Glucose-Capillary: 103 mg/dL — ABNORMAL HIGH (ref 70–99)
Glucose-Capillary: 106 mg/dL — ABNORMAL HIGH (ref 70–99)

## 2019-05-03 LAB — CBC
HCT: 36.5 % — ABNORMAL LOW (ref 39.0–52.0)
Hemoglobin: 12.2 g/dL — ABNORMAL LOW (ref 13.0–17.0)
MCH: 34.9 pg — ABNORMAL HIGH (ref 26.0–34.0)
MCHC: 33.4 g/dL (ref 30.0–36.0)
MCV: 104.3 fL — ABNORMAL HIGH (ref 80.0–100.0)
Platelets: 223 10*3/uL (ref 150–400)
RBC: 3.5 MIL/uL — ABNORMAL LOW (ref 4.22–5.81)
RDW: 13.5 % (ref 11.5–15.5)
WBC: 9.4 10*3/uL (ref 4.0–10.5)
nRBC: 0 % (ref 0.0–0.2)

## 2019-05-03 LAB — PROTIME-INR
INR: 1.4 — ABNORMAL HIGH (ref 0.8–1.2)
Prothrombin Time: 16.7 seconds — ABNORMAL HIGH (ref 11.4–15.2)

## 2019-05-03 LAB — PHOSPHORUS
Phosphorus: 4.7 mg/dL — ABNORMAL HIGH (ref 2.5–4.6)
Phosphorus: 3.7 mg/dL (ref 2.5–4.6)

## 2019-05-03 LAB — MAGNESIUM
Magnesium: 2.7 mg/dL — ABNORMAL HIGH (ref 1.7–2.4)
Magnesium: 1.9 mg/dL (ref 1.7–2.4)

## 2019-05-03 MED ORDER — METHYLPREDNISOLONE SODIUM SUCC 40 MG IJ SOLR
40.0000 mg | Freq: Once | INTRAMUSCULAR | Status: AC
  Administered 2019-05-03: 40 mg via INTRAVENOUS
  Filled 2019-05-03: qty 1

## 2019-05-03 MED ORDER — CHLORHEXIDINE GLUCONATE CLOTH 2 % EX PADS
6.0000 | MEDICATED_PAD | Freq: Every day | CUTANEOUS | Status: DC
  Administered 2019-05-04: 6 via TOPICAL

## 2019-05-03 MED ORDER — IPRATROPIUM-ALBUTEROL 0.5-2.5 (3) MG/3ML IN SOLN: 3.0000 mL | Freq: Four times a day (QID) | RESPIRATORY_TRACT | Status: DC | PRN

## 2019-05-03 MED ORDER — SODIUM CHLORIDE 0.9 % IV SOLN: INTRAVENOUS | Status: DC

## 2019-05-03 MED ORDER — ORAL CARE MOUTH RINSE
15.0000 mL | Freq: Two times a day (BID) | OROMUCOSAL | Status: DC
  Administered 2019-05-03 – 2019-05-06 (×6): 15 mL via OROMUCOSAL

## 2019-05-03 NOTE — Progress Notes (Signed)
OT Cancellation Note  Patient Details Name: Logan [name]. MRN: [id number] DOB: May 29, 1976   Cancelled Treatment:    Reason Eval/Treat Not Completed: Patient not medically ready;Active bedrest order(with sheath)  Richelle Ito, OTR/L  Acute Rehabilitation Services Pager: [phone] Office: [phone] .  05/03/2019, 9:15 AM

## 2019-05-03 NOTE — Progress Notes (Signed)
PT Cancellation Note  Patient Details Name: Logan [name]. MRN: [id number] DOB: 02/01/1976   Cancelled Treatment:    Reason Eval/Treat Not Completed: Patient not medically ready(pt on vent, bedrest with sheath)   Sandar Krinke B Jaylene Schrom 05/03/2019, 8:34 AM  Elwyn Reach, PT Acute Rehabilitation Services Pager: [phone] Office: [phone]

## 2019-05-03 NOTE — Procedures (Signed)
Extubation Procedure Note  Patient Details:   Name: Logan [name]. DOB: [birth year] MRN: [id number]   Airway Documentation:    Vent end date: 05/03/19 Vent end time: 0914   Evaluation  O2 sats: stable throughout Complications: No apparent complications Patient did tolerate procedure well. Bilateral Breath Sounds: Rhonchi   Yes   Pt was extubated per MD and placed on 3 L [place]. Cuff leak was noted prior to extubation. RT will continue to monitor.   Ronaldo Miyamoto 05/03/2019, 9:15 AM

## 2019-05-03 NOTE — Progress Notes (Signed)
Referring Physician(s): Dr. Lorraine Lax  Supervising Physician: Luanne Bras  Patient Status:  [facility] - In-pt  Chief Complaint: Acute CVA  Subjective: Remains intubated, but awakens to name and follows commands. Moving all extremities now.   Allergies: Patient has no known allergies.  Medications:  Current Facility-Administered Medications:     stroke: mapping our early stages of recovery book, , Does not apply, Once, Aroor, Sushanth R, MD   0.9 %  sodium chloride infusion, , Intravenous, Continuous, Erick Colace, NP, Last Rate: 10 mL/hr at 05/03/19 0909   acetaminophen (TYLENOL) tablet 650 mg, 650 mg, Oral, Q4H PRN **OR** acetaminophen (TYLENOL) 160 MG/5ML solution 650 mg, 650 mg, Per Tube, Q4H PRN **OR** acetaminophen (TYLENOL) suppository 650 mg, 650 mg, Rectal, Q4H PRN, Deveshwar, Sanjeev, MD   albuterol (PROVENTIL) (2.5 MG/3ML) 0.083% nebulizer solution 2.5 mg, 2.5 mg, Nebulization, Q4H PRN, Renee Pain, MD   budesonide (PULMICORT) nebulizer solution 0.25 mg, 0.25 mg, Nebulization, BID, Renee Pain, MD, 0.25 mg at 05/03/19 2500   chlorhexidine gluconate (MEDLINE KIT) (PERIDEX) 0.12 % solution 15 mL, 15 mL, Mouth Rinse, BID, Aroor, Lanice Schwab, MD, 15 mL at 05/03/19 3704   Chlorhexidine Gluconate Cloth 2 % PADS 6 each, 6 each, Topical, Daily, Aroor, Lanice Schwab, MD, 6 each at 05/02/19 0430   MEDLINE mouth rinse, 15 mL, Mouth Rinse, 10 times per day, Aroor, Karena Addison R, MD, 15 mL at 05/03/19 0425   norepinephrine (LEVOPHED) 55m in 2519mpremix infusion, 0-40 mcg/min, Intravenous, Titrated, Byrum, RoRose FillersMD, Last Rate: 41.3 mL/hr at 05/03/19 0700, 11 mcg/min at 05/03/19 0700   ondansetron (ZOFRAN) injection 4 mg, 4 mg, Intravenous, Q6H PRN, Deveshwar, Sanjeev, MD   pantoprazole (PROTONIX) injection 40 mg, 40 mg, Intravenous, Q24H, YaElsie LincolnMD, 40 mg at 05/02/19 1004    Vital Signs: BP 121/71    Pulse 84    Temp 98.8 F (37.1 C) (Axillary)    Resp  16    Ht _0  (1.727 m)    Wt 66.3 kg    SpO2 98%    BMI 22.22 kg/m   Physical Exam  Intubated but awake and following commands Neuro: moving all 4 extremities, good (R)hand grip strength Groin: soft, sheath remains. Distal pulses: identified by doppler (B)  Imaging: Ct Angio Head W Or Wo Contrast  Result Date: 05/02/2019 EXAM: CT ANGIOGRAPHY HEAD AND NECK CT PERFUSION BRAIN TECHNIQUE: Multidetector CT imaging of the head and neck was performed using the standard protocol during bolus administration of intravenous contrast. Multiplanar CT image reconstructions and MIPs were obtained to evaluate the vascular anatomy. Carotid stenosis measurements (when applicable) are obtained utilizing NASCET criteria, using the distal internal carotid diameter as the denominator. Multiphase CT imaging of the brain was performed following IV bolus contrast injection. Subsequent parametric perfusion maps were calculated using RAPID software. CONTRAST:  4067mMNIPAQUE IOHEXOL 350 MG/ML SOLN; 14m13mNIPAQUE IOHEXOL 350 MG/ML SOLN COMPARISON:  CTA head and neck September 18, 2018 FINDINGS: CTA NECK FINDINGS SKELETON: There is no bony spinal canal stenosis. No lytic or blastic lesion. OTHER NECK: Normal pharynx, larynx and major salivary glands. No cervical lymphadenopathy. Unremarkable thyroid gland. UPPER CHEST: No pneumothorax or pleural effusion. No nodules or masses. AORTIC ARCH: There is no calcific atherosclerosis of the aortic arch. There is no aneurysm, dissection or hemodynamically significant stenosis of the visualized portion of the aorta. Conventional 3 vessel aortic branching pattern. The visualized proximal subclavian arteries are widely patent. RIGHT CAROTID SYSTEM: No dissection, occlusion  or aneurysm. Mild atherosclerotic calcification at the carotid bifurcation without hemodynamically significant stenosis. LEFT CAROTID SYSTEM: No dissection, occlusion or aneurysm. Mild atherosclerotic calcification at the  carotid bifurcation without hemodynamically significant stenosis. VERTEBRAL ARTERIES: Codominant configuration. Both origins are clearly patent. There is no dissection, occlusion or flow-limiting stenosis to the skull base (V1-V3 segments). CTA HEAD FINDINGS POSTERIOR CIRCULATION: --Vertebral arteries: Normal V4 segments. --Posterior inferior cerebellar arteries (PICA): Patent origins from the vertebral arteries. --Anterior inferior cerebellar arteries (AICA): Patent origins from the basilar artery. --Basilar artery: Normal. --Superior cerebellar arteries: Normal. --Posterior cerebral arteries: Both are diminutive, in keeping with remote bilateral PCA territory infarcts. ANTERIOR CIRCULATION: --Intracranial internal carotid arteries: Normal. --Anterior cerebral arteries (ACA): Normal. Both A1 segments are present. Patent anterior communicating artery (a-comm). --Middle cerebral arteries (MCA): There is occlusion the left M2 inferior division midportion (series 9, image 98). The middle cerebral arteries are otherwise normal. VENOUS SINUSES: As permitted by contrast timing, patent. ANATOMIC VARIANTS: None Review of the MIP images confirms the above findings. CT Brain Perfusion Findings: ASPECTS: 10 CBF (<30%) Volume: 57m Perfusion (Tmax>6.0s) volume: 673mMismatch Volume: 6780mnfarction Location:Posterior left MCA territory. IMPRESSION: 1. Occlusion of the left middle cerebral artery inferior division mid M2 segment. 2. 67 mL area of ischemic penumbra demonstrated by CT perfusion analysis. Critical Value/emergent results were called by telephone at the time of interpretation on 05/02/2019 at 1:07 am to proM[facility] LLCwho verbally acknowledged these results. Electronically Signed   By: KevUlyses JarredD.   On: 05/02/2019 01:26   Ct Head Wo Contrast  Result Date: 05/02/2019 CLINICAL DATA:  Status post thrombectomy EXAM: CT HEAD WITHOUT CONTRAST TECHNIQUE: Contiguous axial images were obtained from the  base of the skull through the vertex without intravenous contrast. COMPARISON:  CT and CTA from earlier today. Limited intraoperative CT from earlier today FINDINGS: Brain: Unchanged contrast or subarachnoid hemorrhage in the left sylvian fissure. No superimposed infarct is detected. Remote bilateral occipital and right thalamic infarcts. Remote right superior cerebellar artery infarct. No hydrocephalus or shift. Vascular: No focal arterial hyperdensity. Skull: Negative Sinuses/Orbits: Nasopharyngeal fluid in the setting of intubation IMPRESSION: 1. Unchanged subarachnoid blood and/or contrast in the left sylvian fissure when compared to intraoperative CT. 2. No detectable MCA infarct. 3. Remote posterior circulation infarcts. Electronically Signed   By: JonMonte FantasiaD.   On: 05/02/2019 09:37   Ct Angio Neck W Or Wo Contrast  Result Date: 05/02/2019 EXAM: CT ANGIOGRAPHY HEAD AND NECK CT PERFUSION BRAIN TECHNIQUE: Multidetector CT imaging of the head and neck was performed using the standard protocol during bolus administration of intravenous contrast. Multiplanar CT image reconstructions and MIPs were obtained to evaluate the vascular anatomy. Carotid stenosis measurements (when applicable) are obtained utilizing NASCET criteria, using the distal internal carotid diameter as the denominator. Multiphase CT imaging of the brain was performed following IV bolus contrast injection. Subsequent parametric perfusion maps were calculated using RAPID software. CONTRAST:  1m45mNIPAQUE IOHEXOL 350 MG/ML SOLN; 80mL55mIPAQUE IOHEXOL 350 MG/ML SOLN COMPARISON:  CTA head and neck September 18, 2018 FINDINGS: CTA NECK FINDINGS SKELETON: There is no bony spinal canal stenosis. No lytic or blastic lesion. OTHER NECK: Normal pharynx, larynx and major salivary glands. No cervical lymphadenopathy. Unremarkable thyroid gland. UPPER CHEST: No pneumothorax or pleural effusion. No nodules or masses. AORTIC ARCH: There is no  calcific atherosclerosis of the aortic arch. There is no aneurysm, dissection or hemodynamically significant stenosis of the visualized portion of the aorta. Conventional 3  vessel aortic branching pattern. The visualized proximal subclavian arteries are widely patent. RIGHT CAROTID SYSTEM: No dissection, occlusion or aneurysm. Mild atherosclerotic calcification at the carotid bifurcation without hemodynamically significant stenosis. LEFT CAROTID SYSTEM: No dissection, occlusion or aneurysm. Mild atherosclerotic calcification at the carotid bifurcation without hemodynamically significant stenosis. VERTEBRAL ARTERIES: Codominant configuration. Both origins are clearly patent. There is no dissection, occlusion or flow-limiting stenosis to the skull base (V1-V3 segments). CTA HEAD FINDINGS POSTERIOR CIRCULATION: --Vertebral arteries: Normal V4 segments. --Posterior inferior cerebellar arteries (PICA): Patent origins from the vertebral arteries. --Anterior inferior cerebellar arteries (AICA): Patent origins from the basilar artery. --Basilar artery: Normal. --Superior cerebellar arteries: Normal. --Posterior cerebral arteries: Both are diminutive, in keeping with remote bilateral PCA territory infarcts. ANTERIOR CIRCULATION: --Intracranial internal carotid arteries: Normal. --Anterior cerebral arteries (ACA): Normal. Both A1 segments are present. Patent anterior communicating artery (a-comm). --Middle cerebral arteries (MCA): There is occlusion the left M2 inferior division midportion (series 9, image 98). The middle cerebral arteries are otherwise normal. VENOUS SINUSES: As permitted by contrast timing, patent. ANATOMIC VARIANTS: None Review of the MIP images confirms the above findings. CT Brain Perfusion Findings: ASPECTS: 10 CBF (<30%) Volume: 45m Perfusion (Tmax>6.0s) volume: 649mMismatch Volume: 6724mnfarction Location:Posterior left MCA territory. IMPRESSION: 1. Occlusion of the left middle cerebral artery  inferior division mid M2 segment. 2. 67 mL area of ischemic penumbra demonstrated by CT perfusion analysis. Critical Value/emergent results were called by telephone at the time of interpretation on 05/02/2019 at 1:07 am to proMedical [facility] Friscowho verbally acknowledged these results. Electronically Signed   By: KevUlyses JarredD.   On: 05/02/2019 01:26   Ct Cerebral Perfusion W Contrast  Result Date: 05/02/2019 EXAM: CT ANGIOGRAPHY HEAD AND NECK CT PERFUSION BRAIN TECHNIQUE: Multidetector CT imaging of the head and neck was performed using the standard protocol during bolus administration of intravenous contrast. Multiplanar CT image reconstructions and MIPs were obtained to evaluate the vascular anatomy. Carotid stenosis measurements (when applicable) are obtained utilizing NASCET criteria, using the distal internal carotid diameter as the denominator. Multiphase CT imaging of the brain was performed following IV bolus contrast injection. Subsequent parametric perfusion maps were calculated using RAPID software. CONTRAST:  54m58mNIPAQUE IOHEXOL 350 MG/ML SOLN; 80mL68mIPAQUE IOHEXOL 350 MG/ML SOLN COMPARISON:  CTA head and neck September 18, 2018 FINDINGS: CTA NECK FINDINGS SKELETON: There is no bony spinal canal stenosis. No lytic or blastic lesion. OTHER NECK: Normal pharynx, larynx and major salivary glands. No cervical lymphadenopathy. Unremarkable thyroid gland. UPPER CHEST: No pneumothorax or pleural effusion. No nodules or masses. AORTIC ARCH: There is no calcific atherosclerosis of the aortic arch. There is no aneurysm, dissection or hemodynamically significant stenosis of the visualized portion of the aorta. Conventional 3 vessel aortic branching pattern. The visualized proximal subclavian arteries are widely patent. RIGHT CAROTID SYSTEM: No dissection, occlusion or aneurysm. Mild atherosclerotic calcification at the carotid bifurcation without hemodynamically significant stenosis. LEFT CAROTID  SYSTEM: No dissection, occlusion or aneurysm. Mild atherosclerotic calcification at the carotid bifurcation without hemodynamically significant stenosis. VERTEBRAL ARTERIES: Codominant configuration. Both origins are clearly patent. There is no dissection, occlusion or flow-limiting stenosis to the skull base (V1-V3 segments). CTA HEAD FINDINGS POSTERIOR CIRCULATION: --Vertebral arteries: Normal V4 segments. --Posterior inferior cerebellar arteries (PICA): Patent origins from the vertebral arteries. --Anterior inferior cerebellar arteries (AICA): Patent origins from the basilar artery. --Basilar artery: Normal. --Superior cerebellar arteries: Normal. --Posterior cerebral arteries: Both are diminutive, in keeping with remote bilateral PCA territory infarcts. ANTERIOR CIRCULATION: --  Intracranial internal carotid arteries: Normal. --Anterior cerebral arteries (ACA): Normal. Both A1 segments are present. Patent anterior communicating artery (a-comm). --Middle cerebral arteries (MCA): There is occlusion the left M2 inferior division midportion (series 9, image 98). The middle cerebral arteries are otherwise normal. VENOUS SINUSES: As permitted by contrast timing, patent. ANATOMIC VARIANTS: None Review of the MIP images confirms the above findings. CT Brain Perfusion Findings: ASPECTS: 10 CBF (<30%) Volume: 69m Perfusion (Tmax>6.0s) volume: 630mMismatch Volume: 6733mnfarction Location:Posterior left MCA territory. IMPRESSION: 1. Occlusion of the left middle cerebral artery inferior division mid M2 segment. 2. 67 mL area of ischemic penumbra demonstrated by CT perfusion analysis. Critical Value/emergent results were called by telephone at the time of interpretation on 05/02/2019 at 1:07 am to proBrad[facility] Incwho verbally acknowledged these results. Electronically Signed   By: KevUlyses JarredD.   On: 05/02/2019 01:26   Dg Chest Port 1 View  Result Date: 05/03/2019 CLINICAL DATA:  Acute respiratory failure  with hypoxia. Intubated. EXAM: PORTABLE CHEST 1 VIEW COMPARISON:  05/02/2019 FINDINGS: Endotracheal tube in satisfactory position. Nasogastric tube tip and side hole in the proximal stomach. 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 Stable enlarged cardiac silhouette. Improved aeration at the lung bases with minimal residual linear atelectasis at the right lung base. Otherwise, clear lungs. Unremarkable bones. IMPRESSION: 1. Stable cardiomegaly. 2. Improved bibasilar atelectasis. Electronically Signed   By: SteClaudie ReveringD.   On: 05/03/2019 06:57   Dg Chest Port 1 View  Result Date: 05/02/2019 CLINICAL DATA:  Endotracheal tube. EXAM: PORTABLE CHEST 1 VIEW COMPARISON:  April 22, 2019. FINDINGS: Stable cardiomegaly. Endotracheal tube is unchanged in position. No pneumothorax is noted. Left lung is clear. Mild right basilar atelectasis or infiltrate is noted. Bony thorax is unremarkable. IMPRESSION: Endotracheal tube in grossly good position. Mild right basilar atelectasis or infiltrate is noted. Electronically Signed   By: JamMarijo ConceptionD.   On: 05/02/2019 09:08   Dg Abd Portable 1v  Result Date: 05/02/2019 CLINICAL DATA:  Gastric tube placement EXAM: PORTABLE ABDOMEN - 1 VIEW COMPARISON:  05/02/2019 FINDINGS: Gastric tube is been advanced and is now coiled in the stomach with the tip in the fundus of the stomach. Normal bowel gas pattern. Visualized lungs are clear. IMPRESSION: NG tube coiled in the stomach with the tip in the fundus. Electronically Signed   By: ChaFranchot GalloD.   On: 05/02/2019 13:55   Dg Abd Portable 1 View  Result Date: 05/02/2019 CLINICAL DATA:  Orogastric tube placement. EXAM: PORTABLE ABDOMEN - 1 VIEW COMPARISON:  Same day. FINDINGS: Distal tip of nasogastric tube is seen in distal esophagus. It is not significantly changed compared to prior exam. No abnormal bowel dilatation is noted. IMPRESSION: Distal tip of nasogastric tube is seen in distal  esophagus and is unchanged compared to prior exam. Advancement is recommended. Electronically Signed   By: JamMarijo ConceptionD.   On: 05/02/2019 13:53   Dg Abd Portable 1 View  Result Date: 05/02/2019 CLINICAL DATA:  NG tube placement. EXAM: PORTABLE ABDOMEN - 1 VIEW COMPARISON:  None. FINDINGS: The NG tube tip is in the distal esophagus and needs to be advanced several cm. The heart is mildly enlarged. The lung bases are grossly clear. The abdominal bowel gas pattern is unremarkable. IMPRESSION: The NG tube tip is in the distal esophagus and needs to be advanced several cm. Electronically Signed   By: P. Marijo SanesD.   On: 05/02/2019  10:45   Ct Head Code Stroke Wo Contrast  Result Date: 05/02/2019 CLINICAL DATA:  Code stroke. Right-sided weakness. Last seen normal 11 p.m. EXAM: CT HEAD WITHOUT CONTRAST TECHNIQUE: Contiguous axial images were obtained from the base of the skull through the vertex without intravenous contrast. COMPARISON:  None. FINDINGS: Brain: There is no mass, hemorrhage or extra-axial collection. There are old bilateral PCA territory infarcts. There is also an old ventrolateral right thalamic infarct. No acute infarct is evident. Vascular: No abnormal hyperdensity of the major intracranial arteries or dural venous sinuses. No intracranial atherosclerosis. Skull: The visualized skull base, calvarium and extracranial soft tissues are normal. Sinuses/Orbits: No fluid levels or advanced mucosal thickening of the visualized paranasal sinuses. No mastoid or middle ear effusion. The orbits are normal. ASPECTS [facility] Stroke Program Early CT Score) - Ganglionic level infarction (caudate, lentiform nuclei, internal capsule, insula, M1-M3 cortex): 7 - Supraganglionic infarction (M4-M6 cortex): 3 Total score (0-10 with 10 being normal): 10 IMPRESSION: 1. No acute intracranial abnormality. 2. 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 *ASPECTS is 10 * These results were communicated to Dr.  Karena Addison Aroor at 12:50 am on 05/02/2019 by text page via the [facility] messaging system. Electronically Signed   By: Ulyses Jarred M.D.   On: 05/02/2019 00:50    Labs:  CBC: Recent Labs    05/02/19 0033  05/02/19 0500 05/02/19 0518 05/02/19 0736 05/03/19 0359 05/03/19 0435  WBC 6.1  --  6.2  --   --   --  9.4  HGB 13.8   < > 12.6* 12.2* 12.6* 11.6* 12.2*  HCT 39.5   < > 35.5* 36.0* 36.2* 34.0* 36.5*  PLT 224  --  215  --   --   --  223   < > = values in this interval not displayed.    COAGS: Recent Labs    05/02/19 0033 05/02/19 1021 05/03/19 0435  INR 3.4* 2.1* 1.4*  APTT 45*  --   --     BMP: Recent Labs    05/02/19 0033 05/02/19 0040 05/02/19 0500 05/02/19 0518 05/03/19 0359 05/03/19 0435  NA 139 140 134* 138 141 139  K 3.6 3.5 3.8 4.8 4.7 4.0  CL 106 102 107  --   --  109  CO2 22  --  20*  --   --  19*  GLUCOSE 96 90 136*  --   --  123*  BUN _0 --   --  10  CALCIUM 9.0  --  7.9*  --   --  8.5*  CREATININE 1.23 1.10 0.99  --   --  0.76  GFRNONAA >60  --  >60  --   --  >60  GFRAA >60  --  >60  --   --  >60    LIVER FUNCTION TESTS: Recent Labs    05/02/19 0033 05/03/19 0435  BILITOT 0.8 1.3*  AST 29 19  ALT 26 18  ALKPHOS 109 89  PROT 6.5 5.7*  ALBUMIN 3.8 3.3*    Assessment and Plan: 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  Also reported by daughter that he has a history of prior stroke with residual dysarthria.  Presented with right-sided weakness and asphasia.  Improving, hopeful for extubation INR  now 1.4, will have sheath removed, post removal orders placed(to be released by RN after removal)  Electronically Signed: Ascencion Dike, PA-C 05/03/2019, 9:17 AM   I spent a total of 15 Minutes  at the the patient's bedside AND on the patient's hospital floor or unit, greater than 50% of which was counseling/coordinating care for L MCA CVA.

## 2019-05-03 NOTE — Progress Notes (Signed)
NAME:  Logan [name]. MRN:  [id number] DOB:  1975-09-06 LOS: 1 ADMISSION DATE:  05/02/2019 DATE OF SERVICE:  05/02/2019  CHIEF COMPLAINT: Right-sided deficits, aphasia  PULMONARY/CRITICAL CARE CONSULT NOTE  History of Present Illness   43 year old male patient with significant history of cardiomyopathy, EF 20 to 25% admitted on 10/28 with new right-sided weakness and aphasia diagnostic evaluation consistent with left MCA stroke, this is been complicated by what appears to be possible non-ST elevation MI, post revascularization subarachnoid hemorrhage , And what appeared to be drug/sedation related hypotension  Sedation  Past Medical/Surgical/Social/Family History   Atrial fib, h/o left atrial thrombus (on xarelto), chronic systolic HF (EF 20-25%), CAD, prior small strokes  Procedures:  10/29: Left common carotid arteriogram, partial recanalization of occluded left MCA distal M2 inferior division    Significant Diagnostic Tests:  10/29: Head CT: No acute intracranial abnormality, old bilateral PCA and right pelvic infarcts CT angiogram  10/29: Occlusion of the left middle cerebral artery inferior division at mid M2 segment area of ischemic penumbra demonstrated by CT perfusion Postprocedural CT brain  10/29: Mild to moderate left perisylvian subarachnoid hemorrhage without mass-effect or shift ECHO 10/29>>>1. No LV thrombus seen. There is akinesis of the septum into the apex concerning for prior infarction. Abnormal septal motion consistent with LBBB. Severely reduced LVEF, EF 15-20%. LVOT VTI 10.6 cm with estimated cardiac output 1.7 L/min (1.0 L/min/m2). 2. Left ventricular ejection fraction, by visual estimation, is <20%. The left ventricle has severely decreased function. There is no left ventricular hypertrophy. 3. Definity contrast agent was given IV to delineate the left ventricular endocardial borders. 4. Abnormal septal motion consistent with left bundle branch block.  5. Elevated left atrial pressure. 6. Left ventricular diastolic parameters are consistent with Grade III diastolic dysfunction (restrictive). 7. Severely dilated left ventricular internal cavity size. 8. The left ventricle demonstrates global hypokinesis. 9. Global right ventricle has moderately reduced systolic function.The right ventricular size is normal. No increase in right ventricular wall thickness. 10. Left atrial size was moderately dilated. 11. Mild mitral annular calcification. 12. The mitral valve is grossly normal. Moderate mitral valve regurgitation. 13. Severe restricted movement of PMVL with at least moderate (maybe severe MR, but poorly quantitated). 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 MRI brain 10/29>>>   Hospital course   10/29  admitted 10/30 stable overnight. Extubated Micro Data:  covid 19 10/29: neg   Antimicrobials:      Interim history/subjective:  No distress. Looks comfortable on SBT   Objective   Blood Pressure 121/71   Pulse 77   Temperature 98.8 F (37.1 C) (Axillary)   Respiration 16   Height 5\' 8"  (1.727 m)   Weight 66.3 kg   Oxygen Saturation 95%   Body Mass Index 22.22 kg/m     Filed Weights   05/02/19 0000 05/03/19 0345  Weight: 66 kg 66.3 kg    Intake/Output Summary (Last 24 hours) at 05/03/2019 0851 Last data filed at 05/03/2019 0700 Gross per 24 hour  Intake 3283.81 ml  Output 575 ml  Net 2708.81 ml    Vent Mode: PRVC FiO2 (%):  [40 %] 40 % Set Rate:  [16 bmp] 16 bmp Vt Set:  [540 mL] 540 mL PEEP:  [5 cmH20] 5 cmH20 Plateau Pressure:  [11 cmH20-23 cmH20] 23 cmH20   Examination:  General this is a 43 year old white male admitted s/p stroke. Now on SBT and looks excellent HENT NCAT no JVD MMM orally intubated. Pupils not icteric  Pulm Clear excellent VT on SBT w/ volumes in 600 ml range Card Reg irreg soft systolic murmur  abd not tender + bowel sounds GU cl yellow  Neuro awake and alert moves all ext follows  commands  Resolved Hospital Problem list      Assessment & Plan:  Acute MCA stroke c/b post-procedural SAH s/p revascularization in IR Plan SBP goals 120-140; can likely liberalize this today (will defer to neuro teams) Cont serial neuro checks Will eventually need MRI Still working on getting sheath removed.    Acute respiratory failure w/ ventilator dependence 2/2 stroke and subsequent SAH -h/o tobacco abuse  pcxr basilar atx but improved. Support tubes and lines stable Excellent vent mechanics on SBT Plan Wean to extubate Titrate oxygen Pulse ox  Aspiration and reflux precautions  Bradycardia and Hypotension etiology not clear; favor sedation related Plan Taper norepi for MAP > 65 Cont tele  Holding BB  Acute on chronic ICM (EF 20-25% now down to 15-20%); h/o  atrial fib and LV thrombus (had been on xarelto; thrombus resolved) now w/ concern for new NSTEMI (increased trop I and EKG changes) Plan Cont tele Holding AC due to [facility] BUT we will need to determine when this can be resumed  Holding BB given hypotension Will eventually need cards eval  At risk for fluid and electrolyte imbalance Plan Trend chemistries   At risk for anemia  hgb stable overnight  Plan Trend cbc; this should be minimally every 8 hrs once we resume ac   Coagulopathy  -INR 3.4 -->1.4 Plan Trend INR   Best practice:  Diet: NPO for now Pain/Anxiety/Delirium protocol (if indicated): YES VAP protocol (if indicated): YES DVT prophylaxis: SCDs GI prophylaxis: Protonix Glucose control: per protocol Mobility/Activity: Bed Rest CODE STATUS:   Code Status: Full Code Family Communication:  No family at the bedside. Disposition: Keep in ICU. Ready for extubation. Cont w/ BP control. Will need to determine timing when we can safely resume AC  My cct 33 minutes.   Erick Colace ACNP-BC [place] Pager # [phone] OR # [phone] if no answer      Critical care  time:

## 2019-05-03 NOTE — Progress Notes (Signed)
Pt was identified by name and d.o.b. 6Fr sheath was removed from right groin at 0931 hrs and manual pressure was held for 45 minutes. Hemostasis was achieved at 1016 hrs and was verified by [facility] R.N/Coy RTR/Cory RTR. Quik clot/gauze/tegaderm/pressure dressing was applied to the site. All distal pulses are palpable at this time. Quik clot dressing should be removed by tomorrow by 1015 hrs.

## 2019-05-03 NOTE — Progress Notes (Signed)
Saw patient at bedside  Increased oxygen requirement  Decreased air movement bilaterally Admits to history of smoking  Add bronchodilators Dose of steroids Wean down FiO2 as tolerated

## 2019-05-03 NOTE — Progress Notes (Signed)
STROKE TEAM PROGRESS NOTE   INTERVAL HISTORY Extubated this am. Sheath removed this am. For MRI once ICD [employer] present for programming.  Blood pressure adequately controlled.  Vital signs remained stable.  Is awake and following commands and moving all 4 extremities purposefully.  Vitals:   05/03/19 0600 05/03/19 0630 05/03/19 0700 05/03/19 0727  BP: 125/89 123/81 118/83 121/71  Pulse: 85 81 81 77  Resp: Temp:    98.8 F (37.1 C)  TempSrc:    Axillary  SpO2: 96% 94% 97% 95%  Weight:      Height:        CBC:  Recent Labs  Lab 05/02/19 0033  05/02/19 0500  05/03/19 0359 05/03/19 0435  WBC 6.1  --  6.2  --   --  9.4  NEUTROABS 3.2  --  4.6  --   --   --   HGB 13.8   < > 12.6*   < > 11.6* 12.2*  HCT 39.5   < > 35.5*   < > 34.0* 36.5*  MCV 100.0  --  100.0  --   --  104.3*  PLT 224  --  215  --   --  223   < > = values in this interval not displayed.    Basic Metabolic Panel:  Recent Labs  Lab 05/02/19 0500  05/02/19 1806 05/03/19 0359 05/03/19 0435  NA 134*   < >  --  141 139  K 3.8   < >  --  4.7 4.0  CL 107  --   --   --  109  CO2 20*  --   --   --  19*  GLUCOSE 136*  --   --   --  123*  BUN 8  --   --   --  10  CREATININE 0.99  --   --   --  0.76  CALCIUM 7.9*  --   --   --  8.5*  MG  --    < > 2.7*  --  1.9  PHOS  --    < > 4.7*  --  3.7   < > = values in this interval not displayed.   Lipid Panel:     Component Value Date/Time   CHOL 122 05/02/2019 0500   TRIG 34 05/02/2019 0500   HDL 31 (L) 05/02/2019 0500   CHOLHDL 3.9 05/02/2019 0500   VLDL 7 05/02/2019 0500   LDLCALC 84 05/02/2019 0500   HgbA1c:  Lab Results  Component Value Date   HGBA1C 5.4 05/02/2019   Urine Drug Screen:     Component Value Date/Time   LABOPIA NONE DETECTED 05/02/2019 0756   COCAINSCRNUR NONE DETECTED 05/02/2019 0756   LABBENZ NONE DETECTED 05/02/2019 0756   AMPHETMU NONE DETECTED 05/02/2019 0756   THCU NONE DETECTED 05/02/2019 0756   LABBARB  NONE DETECTED 05/02/2019 0756    Alcohol Level No results found for: ETH  IMAGING Ct Angio Head W Or Wo Contrast  Result Date: 05/02/2019 EXAM: CT ANGIOGRAPHY HEAD AND NECK CT PERFUSION BRAIN TECHNIQUE: Multidetector CT imaging of the head and neck was performed using the standard protocol during bolus administration of intravenous contrast. Multiplanar CT image reconstructions and MIPs were obtained to evaluate the vascular anatomy. Carotid stenosis measurements (when applicable) are obtained utilizing NASCET criteria, using the distal internal carotid diameter as the denominator. Multiphase CT imaging of the brain was performed following IV bolus  contrast injection. Subsequent parametric perfusion maps were calculated using RAPID software. CONTRAST:  40mL OMNIPAQUE IOHEXOL 350 MG/ML SOLN; 80mL OMNIPAQUE IOHEXOL 350 MG/ML SOLN COMPARISON:  CTA head and neck September 18, 2018 FINDINGS: CTA NECK FINDINGS SKELETON: There is no bony spinal canal stenosis. No lytic or blastic lesion. OTHER NECK: Normal pharynx, larynx and major salivary glands. No cervical lymphadenopathy. Unremarkable thyroid gland. UPPER CHEST: No pneumothorax or pleural effusion. No nodules or masses. AORTIC ARCH: There is no calcific atherosclerosis of the aortic arch. There is no aneurysm, dissection or hemodynamically significant stenosis of the visualized portion of the aorta. Conventional 3 vessel aortic branching pattern. The visualized proximal subclavian arteries are widely patent. RIGHT CAROTID SYSTEM: No dissection, occlusion or aneurysm. Mild atherosclerotic calcification at the carotid bifurcation without hemodynamically significant stenosis. LEFT CAROTID SYSTEM: No dissection, occlusion or aneurysm. Mild atherosclerotic calcification at the carotid bifurcation without hemodynamically significant stenosis. VERTEBRAL ARTERIES: Codominant configuration. Both origins are clearly patent. There is no dissection, occlusion or flow-limiting  stenosis to the skull base (V1-V3 segments). CTA HEAD FINDINGS POSTERIOR CIRCULATION: --Vertebral arteries: Normal V4 segments. --Posterior inferior cerebellar arteries (PICA): Patent origins from the vertebral arteries. --Anterior inferior cerebellar arteries (AICA): Patent origins from the basilar artery. --Basilar artery: Normal. --Superior cerebellar arteries: Normal. --Posterior cerebral arteries: Both are diminutive, in keeping with remote bilateral PCA territory infarcts. ANTERIOR CIRCULATION: --Intracranial internal carotid arteries: Normal. --Anterior cerebral arteries (ACA): Normal. Both A1 segments are present. Patent anterior communicating artery (a-comm). --Middle cerebral arteries (MCA): There is occlusion the left M2 inferior division midportion (series 9, image 98). The middle cerebral arteries are otherwise normal. VENOUS SINUSES: As permitted by contrast timing, patent. ANATOMIC VARIANTS: None Review of the MIP images confirms the above findings. CT Brain Perfusion Findings: ASPECTS: 10 CBF (<30%) Volume: 0mL Perfusion (Tmax>6.0s) volume: 67mL Mismatch Volume: 67mL Infarction Location:Posterior left MCA territory. IMPRESSION: 1. Occlusion of the left middle cerebral artery inferior division mid M2 segment. 2. 67 mL area of ischemic penumbra demonstrated by CT perfusion analysis. Critical Value/emergent results were called by telephone at the time of interpretation on 05/02/2019 at 1:07 am to [facility] , who verbally acknowledged these results. Electronically Signed   By: Deatra Robinson M.D.   On: 05/02/2019 01:26   Ct Head Wo Contrast  Result Date: 05/02/2019 CLINICAL DATA:  Status post thrombectomy EXAM: CT HEAD WITHOUT CONTRAST TECHNIQUE: Contiguous axial images were obtained from the base of the skull through the vertex without intravenous contrast. COMPARISON:  CT and CTA from earlier today. Limited intraoperative CT from earlier today FINDINGS: Brain: Unchanged contrast or  subarachnoid hemorrhage in the left sylvian fissure. No superimposed infarct is detected. Remote bilateral occipital and right thalamic infarcts. Remote right superior cerebellar artery infarct. No hydrocephalus or shift. Vascular: No focal arterial hyperdensity. Skull: Negative Sinuses/Orbits: Nasopharyngeal fluid in the setting of intubation IMPRESSION: 1. Unchanged subarachnoid blood and/or contrast in the left sylvian fissure when compared to intraoperative CT. 2. No detectable MCA infarct. 3. Remote posterior circulation infarcts. Electronically Signed   By: Marnee Spring M.D.   On: 05/02/2019 09:37   Ct Angio Neck W Or Wo Contrast  Result Date: 05/02/2019 EXAM: CT ANGIOGRAPHY HEAD AND NECK CT PERFUSION BRAIN TECHNIQUE: Multidetector CT imaging of the head and neck was performed using the standard protocol during bolus administration of intravenous contrast. Multiplanar CT image reconstructions and MIPs were obtained to evaluate the vascular anatomy. Carotid stenosis measurements (when applicable) are obtained utilizing NASCET criteria, using the distal  internal carotid diameter as the denominator. Multiphase CT imaging of the brain was performed following IV bolus contrast injection. Subsequent parametric perfusion maps were calculated using RAPID software. CONTRAST:  33mL OMNIPAQUE IOHEXOL 350 MG/ML SOLN; 57mL OMNIPAQUE IOHEXOL 350 MG/ML SOLN COMPARISON:  CTA head and neck September 18, 2018 FINDINGS: CTA NECK FINDINGS SKELETON: There is no bony spinal canal stenosis. No lytic or blastic lesion. OTHER NECK: Normal pharynx, larynx and major salivary glands. No cervical lymphadenopathy. Unremarkable thyroid gland. UPPER CHEST: No pneumothorax or pleural effusion. No nodules or masses. AORTIC ARCH: There is no calcific atherosclerosis of the aortic arch. There is no aneurysm, dissection or hemodynamically significant stenosis of the visualized portion of the aorta. Conventional 3 vessel aortic branching  pattern. The visualized proximal subclavian arteries are widely patent. RIGHT CAROTID SYSTEM: No dissection, occlusion or aneurysm. Mild atherosclerotic calcification at the carotid bifurcation without hemodynamically significant stenosis. LEFT CAROTID SYSTEM: No dissection, occlusion or aneurysm. Mild atherosclerotic calcification at the carotid bifurcation without hemodynamically significant stenosis. VERTEBRAL ARTERIES: Codominant configuration. Both origins are clearly patent. There is no dissection, occlusion or flow-limiting stenosis to the skull base (V1-V3 segments). CTA HEAD FINDINGS POSTERIOR CIRCULATION: --Vertebral arteries: Normal V4 segments. --Posterior inferior cerebellar arteries (PICA): Patent origins from the vertebral arteries. --Anterior inferior cerebellar arteries (AICA): Patent origins from the basilar artery. --Basilar artery: Normal. --Superior cerebellar arteries: Normal. --Posterior cerebral arteries: Both are diminutive, in keeping with remote bilateral PCA territory infarcts. ANTERIOR CIRCULATION: --Intracranial internal carotid arteries: Normal. --Anterior cerebral arteries (ACA): Normal. Both A1 segments are present. Patent anterior communicating artery (a-comm). --Middle cerebral arteries (MCA): There is occlusion the left M2 inferior division midportion (series 9, image 98). The middle cerebral arteries are otherwise normal. VENOUS SINUSES: As permitted by contrast timing, patent. ANATOMIC VARIANTS: None Review of the MIP images confirms the above findings. CT Brain Perfusion Findings: ASPECTS: 10 CBF (<30%) Volume: 30mL Perfusion (Tmax>6.0s) volume: 74mL Mismatch Volume: 1mL Infarction Location:Posterior left MCA territory. IMPRESSION: 1. Occlusion of the left middle cerebral artery inferior division mid M2 segment. 2. 67 mL area of ischemic penumbra demonstrated by CT perfusion analysis. Critical Value/emergent results were called by telephone at the time of interpretation on  05/02/2019 at 1:07 am to [facility] , who verbally acknowledged these results. Electronically Signed   By: Deatra Robinson M.D.   On: 05/02/2019 01:26   Ct Cerebral Perfusion W Contrast  Result Date: 05/02/2019 EXAM: CT ANGIOGRAPHY HEAD AND NECK CT PERFUSION BRAIN TECHNIQUE: Multidetector CT imaging of the head and neck was performed using the standard protocol during bolus administration of intravenous contrast. Multiplanar CT image reconstructions and MIPs were obtained to evaluate the vascular anatomy. Carotid stenosis measurements (when applicable) are obtained utilizing NASCET criteria, using the distal internal carotid diameter as the denominator. Multiphase CT imaging of the brain was performed following IV bolus contrast injection. Subsequent parametric perfusion maps were calculated using RAPID software. CONTRAST:  79mL OMNIPAQUE IOHEXOL 350 MG/ML SOLN; 47mL OMNIPAQUE IOHEXOL 350 MG/ML SOLN COMPARISON:  CTA head and neck September 18, 2018 FINDINGS: CTA NECK FINDINGS SKELETON: There is no bony spinal canal stenosis. No lytic or blastic lesion. OTHER NECK: Normal pharynx, larynx and major salivary glands. No cervical lymphadenopathy. Unremarkable thyroid gland. UPPER CHEST: No pneumothorax or pleural effusion. No nodules or masses. AORTIC ARCH: There is no calcific atherosclerosis of the aortic arch. There is no aneurysm, dissection or hemodynamically significant stenosis of the visualized portion of the aorta. Conventional 3 vessel aortic branching pattern. The  visualized proximal subclavian arteries are widely patent. RIGHT CAROTID SYSTEM: No dissection, occlusion or aneurysm. Mild atherosclerotic calcification at the carotid bifurcation without hemodynamically significant stenosis. LEFT CAROTID SYSTEM: No dissection, occlusion or aneurysm. Mild atherosclerotic calcification at the carotid bifurcation without hemodynamically significant stenosis. VERTEBRAL ARTERIES: Codominant configuration.  Both origins are clearly patent. There is no dissection, occlusion or flow-limiting stenosis to the skull base (V1-V3 segments). CTA HEAD FINDINGS POSTERIOR CIRCULATION: --Vertebral arteries: Normal V4 segments. --Posterior inferior cerebellar arteries (PICA): Patent origins from the vertebral arteries. --Anterior inferior cerebellar arteries (AICA): Patent origins from the basilar artery. --Basilar artery: Normal. --Superior cerebellar arteries: Normal. --Posterior cerebral arteries: Both are diminutive, in keeping with remote bilateral PCA territory infarcts. ANTERIOR CIRCULATION: --Intracranial internal carotid arteries: Normal. --Anterior cerebral arteries (ACA): Normal. Both A1 segments are present. Patent anterior communicating artery (a-comm). --Middle cerebral arteries (MCA): There is occlusion the left M2 inferior division midportion (series 9, image 98). The middle cerebral arteries are otherwise normal. VENOUS SINUSES: As permitted by contrast timing, patent. ANATOMIC VARIANTS: None Review of the MIP images confirms the above findings. CT Brain Perfusion Findings: ASPECTS: 10 CBF (<30%) Volume: 0mL Perfusion (Tmax>6.0s) volume: 67mL Mismatch Volume: 67mL Infarction Location:Posterior left MCA territory. IMPRESSION: 1. Occlusion of the left middle cerebral artery inferior division mid M2 segment. 2. 67 mL area of ischemic penumbra demonstrated by CT perfusion analysis. Critical Value/emergent results were called by telephone at the time of interpretation on 05/02/2019 at 1:07 am to [facility] , who verbally acknowledged these results. Electronically Signed   By: Deatra RobinsonKevin  Herman M.D.   On: 05/02/2019 01:26   Dg Chest Port 1 View  Result Date: 05/03/2019 CLINICAL DATA:  Acute respiratory failure with hypoxia. Intubated. EXAM: PORTABLE CHEST 1 VIEW COMPARISON:  05/02/2019 FINDINGS: Endotracheal tube in satisfactory position. Nasogastric tube tip and side hole in the proximal stomach. Stable  presternal pacemaker lead. Stable post CABG changes. Stable enlarged cardiac silhouette. Improved aeration at the lung bases with minimal residual linear atelectasis at the right lung base. Otherwise, clear lungs. Unremarkable bones. IMPRESSION: 1. Stable cardiomegaly. 2. Improved bibasilar atelectasis. Electronically Signed   By: Beckie SaltsSteven  Reid M.D.   On: 05/03/2019 06:57   Dg Chest Port 1 View  Result Date: 05/02/2019 CLINICAL DATA:  Endotracheal tube. EXAM: PORTABLE CHEST 1 VIEW COMPARISON:  April 22, 2019. FINDINGS: Stable cardiomegaly. Endotracheal tube is unchanged in position. No pneumothorax is noted. Left lung is clear. Mild right basilar atelectasis or infiltrate is noted. Bony thorax is unremarkable. IMPRESSION: Endotracheal tube in grossly good position. Mild right basilar atelectasis or infiltrate is noted. Electronically Signed   By: Lupita RaiderJames  Green Jr M.D.   On: 05/02/2019 09:08   Dg Abd Portable 1v  Result Date: 05/02/2019 CLINICAL DATA:  Gastric tube placement EXAM: PORTABLE ABDOMEN - 1 VIEW COMPARISON:  05/02/2019 FINDINGS: Gastric tube is been advanced and is now coiled in the stomach with the tip in the fundus of the stomach. Normal bowel gas pattern. Visualized lungs are clear. IMPRESSION: NG tube coiled in the stomach with the tip in the fundus. Electronically Signed   By: Marlan Palauharles  Clark M.D.   On: 05/02/2019 13:55   Dg Abd Portable 1 View  Result Date: 05/02/2019 CLINICAL DATA:  Orogastric tube placement. EXAM: PORTABLE ABDOMEN - 1 VIEW COMPARISON:  Same day. FINDINGS: Distal tip of nasogastric tube is seen in distal esophagus. It is not significantly changed compared to prior exam. No abnormal bowel dilatation is noted. IMPRESSION: Distal tip of  nasogastric tube is seen in distal esophagus and is unchanged compared to prior exam. Advancement is recommended. Electronically Signed   By: Marijo Conception M.D.   On: 05/02/2019 13:53   Dg Abd Portable 1 View  Result Date:  05/02/2019 CLINICAL DATA:  NG tube placement. EXAM: PORTABLE ABDOMEN - 1 VIEW COMPARISON:  None. FINDINGS: The NG tube tip is in the distal esophagus and needs to be advanced several cm. The heart is mildly enlarged. The lung bases are grossly clear. The abdominal bowel gas pattern is unremarkable. IMPRESSION: The NG tube tip is in the distal esophagus and needs to be advanced several cm. Electronically Signed   By: Marijo Sanes M.D.   On: 05/02/2019 10:45   Ct Head Code Stroke Wo Contrast  Result Date: 05/02/2019 CLINICAL DATA:  Code stroke. Right-sided weakness. Last seen normal 11 p.m. EXAM: CT HEAD WITHOUT CONTRAST TECHNIQUE: Contiguous axial images were obtained from the base of the skull through the vertex without intravenous contrast. COMPARISON:  None. FINDINGS: Brain: There is no mass, hemorrhage or extra-axial collection. There are old bilateral PCA territory infarcts. There is also an old ventrolateral right thalamic infarct. No acute infarct is evident. Vascular: No abnormal hyperdensity of the major intracranial arteries or dural venous sinuses. No intracranial atherosclerosis. Skull: The visualized skull base, calvarium and extracranial soft tissues are normal. Sinuses/Orbits: No fluid levels or advanced mucosal thickening of the visualized paranasal sinuses. No mastoid or middle ear effusion. The orbits are normal. ASPECTS [facility] Stroke Program Early CT Score) - Ganglionic level infarction (caudate, lentiform nuclei, internal capsule, insula, M1-M3 cortex): 7 - Supraganglionic infarction (M4-M6 cortex): 3 Total score (0-10 with 10 being normal): 10 IMPRESSION: 1. No acute intracranial abnormality. 2. 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 *ASPECTS is 10 * These results were communicated to Dr. Karena Addison Aroor at 12:50 am on 05/02/2019 by text page via the [facility] messaging system. Electronically Signed   By: Ulyses Jarred M.D.   On: 05/02/2019 00:50   Cerebral Angio 05/02/2019 S/P LT  common carotid arteriogram followed by partial recanalization of occluded Lt MCA distal M2 inf division with x 1 pass wit solitairex 83mm x 40 mm X retrieval device and penumbra aspiration Achieving a TICI 2c revascularization.  2D Echocardiogram  1. No LV thrombus seen. There is akinesis of the septum into the apex concerning for prior infarction. Abnormal septal motion consistent with LBBB. Severely reduced LVEF, EF 15-20%. LVOT VTI 10.6 cm with estimated cardiac output 1.7 L/min (1.0  L/min/m2).  2. Left ventricular ejection fraction, by visual estimation, is <20%. The left ventricle has severely decreased function. There is no left ventricular hypertrophy.  3. Definity contrast agent was given IV to delineate the left ventricular endocardial borders.  4. Abnormal septal motion consistent with left bundle branch block.  5. Elevated left atrial pressure.  6. Left ventricular diastolic parameters are consistent with Grade III diastolic dysfunction (restrictive).  7. Severely dilated left ventricular internal cavity size.  8. The left ventricle demonstrates global hypokinesis.  9. Global right ventricle has moderately reduced systolic function.The right ventricular size is normal. No increase in right ventricular wall thickness. 10. Left atrial size was moderately dilated. 11. Mild mitral annular calcification. 12. The mitral valve is grossly normal. Moderate mitral valve regurgitation. 13. Severe restricted movement of PMVL with at least moderate (maybe severe MR, but poorly quantitated). 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 14. The tricuspid valve is grossly normal. Tricuspid valve regurgitation is trivial. 15.  The aortic valve is tricuspid. Aortic valve regurgitation is not visualized. No evidence of aortic valve sclerosis or stenosis. 16. The pulmonic valve was grossly normal. Pulmonic valve regurgitation is not visualized. 17. The inferior vena cava IVC unable to be assessed due to ventilator  therapy. 18. Right atrial size was normal.   PHYSICAL EXAM yes  Middle-aged Caucasian male who is sedated intubated.  He has right groin sheath. Sedated intubated. . Afebrile. Head is nontraumatic. Neck is supple without bruit.    Cardiac exam no murmur or gallop. Lungs are clear to auscultation. Distal pulses are well felt. Neurological Exam :  Sedated intubated. Eyes closed. Opens eyes partially to stimuli.Not following commands. Eyes primary position. Pupils small equal reactive. Doll`s eye movts sluggish. cornrals present. Cough gag present. Moves right side more than left to painful stimuli. DTR`s   ASSESSMENT/PLAN [name]. is a 43 y.o. male with history of AF and LV thrombus on [facility], CHF, cardiomyopathy , prior stroke presenting nonverbal w/ R sided weakness. No tPA as on xarelto. 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 Old B PCA and R thalamic infarcts. ASPECTS 10.     CTA head & neck occlusion L MCA inferior M2 division.  CT perfusion 67 mL penumbnra  Cerebral angio TICI2c recanalization of occluded L M2 w/ solitaire and penumbra  Post IR CT mild to moderate L perisylvian SAH. L sheath left in d/t INR 3.4.  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 No source of embolus   LDL 84  HgbA1c 5.4  SCDs for VTE prophylaxis  Xarelto (rivaroxaban) daily prior to admission, now on No antithrombotic post IR w/ SAH hemorrhage and elevated INR on xarelto PTA. Hold antithrombotic at this time.   Therapy recommendations:  pending   Disposition:  pending   Sheath removed this am, currently on bedrest  Atrial Fibrillation H/o LV thrombus  Home anticoagulation:  Xarelto (rivaroxaban) daily   Off AC at  this time d/t [facility]  Consider IV heparin pending MRI results  Coagulopathy  INR 3.4 - likely falsely elevated d/t xarelto   Repeat INR 1.4  Sheath removed this am, currently on bedrest    Respiratory Failure COPD  Intubated for IR, left intubated d/t high INR and sheath left in  Extubated 10/30 am  CCM onboard   Hypotension Hx Hypertension  Home meds:  Lasix 20, losartan 25 bid, spironolactone 25  On norepinephrine  Taper norepi    BP goal listed as > 90 - should also be < 160  . Long-term BP goal normotensive  Hyperlipidemia  Home meds:  lipitor 80  LDL 84, goal < 70  Hold statin at this time given hemorrhage  Continue statin at discharge  Suspected NSTEMI  Elevated troponin  No antiplatelets/antithrombotics given SAH  No BB given multifactorial shock  Acute blood loss Anemia   R groin bleeding from sheath site, Also post IR  Hb 12.6-11.6-12.2  Dysphagia . Secondary to stroke . NPO . Speech to see once extubated   Other Stroke Risk Factors  Cigarette smoker  ETOH use  Hx Substance abuse marijuana, UDS:  THC NONE DETECTED  Hx stroke/TIA  12/2017 - TIA 5 weeks ago. back to baseline. Unable to get MRI due to PPM. on xarelto, continued  03/2017 - Right superior cerebellar infarct - embolic - likely secondary to cardiomyopathy. Interventional Neuro Radiology. Complete revascularization of occluded RT MCA M1 seg with solitaire device and reestablishment if TICI 3 reperfusion.  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  He was treated with mechanical thrombectomy with excellent revascularization.  Continue close neurological monitoring and strict blood pressure control as per post interventional protocol.  Mobilize out of bed.  Therapy consults.  Speech therapy for swallow eval.  Check MRI scan of the brain later today.  Will need to hold anticoagulation due to subarachnoid hemorrhage.  This patient is critically ill and at significant risk of neurological worsening, death and care requires constant monitoring of vital signs, hemodynamics,respiratory and cardiac monitoring, extensive review of multiple databases, frequent neurological assessment, discussion with family, other specialists and medical decision making of high complexity.I have made any additions or clarifications directly to the above note.This critical care time does not reflect procedure time, or teaching time or supervisory time of PA/NP/Med Resident etc but could involve care discussion time.  I spent 30 minutes of neurocritical care time  in the care of  this patient.  Delia HeadyPramod , MD Medical Director [facility] Pager: [phone] 05/03/2019 9:11 AM   To contact Stroke Continuity provider, please refer to [email or web address]. After hours, contact General Neurology

## 2019-05-04 ENCOUNTER — Encounter (HOSPITAL_COMMUNITY): Payer: Self-pay

## 2019-05-04 ENCOUNTER — Inpatient Hospital Stay (HOSPITAL_COMMUNITY): Payer: Medicare Other

## 2019-05-04 ENCOUNTER — Other Ambulatory Visit: Payer: Self-pay

## 2019-05-04 DIAGNOSIS — I5023 Acute on chronic systolic (congestive) heart failure: Secondary | ICD-10-CM

## 2019-05-04 DIAGNOSIS — I63412 Cerebral infarction due to embolism of left middle cerebral artery: Principal | ICD-10-CM

## 2019-05-04 LAB — CULTURE, RESPIRATORY W GRAM STAIN: Culture: NORMAL

## 2019-05-04 LAB — CBC
HCT: 31.7 % — ABNORMAL LOW (ref 39.0–52.0)
Hemoglobin: 11 g/dL — ABNORMAL LOW (ref 13.0–17.0)
MCH: 35.1 pg — ABNORMAL HIGH (ref 26.0–34.0)
MCHC: 34.7 g/dL (ref 30.0–36.0)
MCV: 101.3 fL — ABNORMAL HIGH (ref 80.0–100.0)
Platelets: 163 10*3/uL (ref 150–400)
RBC: 3.13 MIL/uL — ABNORMAL LOW (ref 4.22–5.81)
RDW: 13.2 % (ref 11.5–15.5)
WBC: 5.7 10*3/uL (ref 4.0–10.5)
nRBC: 0 % (ref 0.0–0.2)

## 2019-05-04 LAB — COMPREHENSIVE METABOLIC PANEL
ALT: 15 U/L (ref 0–44)
AST: 18 U/L (ref 15–41)
Albumin: 2.8 g/dL — ABNORMAL LOW (ref 3.5–5.0)
Alkaline Phosphatase: 73 U/L (ref 38–126)
Anion gap: 11 (ref 5–15)
BUN: 12 mg/dL (ref 6–20)
CO2: 19 mmol/L — ABNORMAL LOW (ref 22–32)
Calcium: 8.8 mg/dL — ABNORMAL LOW (ref 8.9–10.3)
Chloride: 111 mmol/L (ref 98–111)
Creatinine, Ser: 0.78 mg/dL (ref 0.61–1.24)
GFR calc Af Amer: >60 mL/min (ref >60)
GFR calc non Af Amer: >60 mL/min (ref >60)
Glucose, Bld: 101 mg/dL — ABNORMAL HIGH (ref 70–99)
Potassium: 4 mmol/L (ref 3.5–5.1)
Sodium: 141 mmol/L (ref 135–145)
Total Bilirubin: 1.3 mg/dL — ABNORMAL HIGH (ref 0.3–1.2)
Total Protein: 5.3 g/dL — ABNORMAL LOW (ref 6.5–8.1)

## 2019-05-04 LAB — GLUCOSE, CAPILLARY
Glucose-Capillary: 99 mg/dL (ref 70–99)
Glucose-Capillary: 96 mg/dL (ref 70–99)

## 2019-05-04 LAB — MAGNESIUM: Magnesium: 1.9 mg/dL (ref 1.7–2.4)

## 2019-05-04 LAB — PHOSPHORUS: Phosphorus: 3.6 mg/dL (ref 2.5–4.6)

## 2019-05-04 MED ORDER — ATORVASTATIN CALCIUM 80 MG PO TABS
80.0000 mg | ORAL_TABLET | Freq: Every day | ORAL | Status: DC
  Administered 2019-05-04 – 2019-05-05 (×2): 80 mg via ORAL
  Filled 2019-05-04 (×2): qty 1

## 2019-05-04 MED ORDER — PANTOPRAZOLE SODIUM 40 MG PO TBEC
40.0000 mg | DELAYED_RELEASE_TABLET | Freq: Every day | ORAL | Status: DC
  Administered 2019-05-04 – 2019-05-06 (×3): 40 mg via ORAL
  Filled 2019-05-04 (×3): qty 1

## 2019-05-04 MED ORDER — ASPIRIN EC 81 MG PO TBEC
81.0000 mg | DELAYED_RELEASE_TABLET | Freq: Every day | ORAL | Status: DC
  Administered 2019-05-04 – 2019-05-06 (×3): 81 mg via ORAL
  Filled 2019-05-04 (×3): qty 1

## 2019-05-04 MED ORDER — MAGNESIUM SULFATE IN D5W 1-5 GM/100ML-% IV SOLN
1.0000 g | Freq: Once | INTRAVENOUS | Status: AC
  Administered 2019-05-04: 1 g via INTRAVENOUS
  Filled 2019-05-04: qty 100

## 2019-05-04 NOTE — Progress Notes (Signed)
NAME:  Logan [name]. MRN:  [id number] DOB:  1976-02-13 LOS: 2 ADMISSION DATE:  05/02/2019 DATE OF SERVICE:  05/02/2019  CHIEF COMPLAINT: Right-sided deficits, aphasia  PULMONARY/CRITICAL CARE CONSULT NOTE  History of Present Illness   43 year old male patient with significant history of cardiomyopathy, EF 20 to 25% admitted on 10/28 with new right-sided weakness and aphasia diagnostic evaluation consistent with left MCA stroke, this is been complicated by what appears to be possible non-ST elevation MI, post revascularization subarachnoid hemorrhage , And what appeared to be drug/sedation related hypotension  Sedation  Past Medical/Surgical/Social/Family History   Atrial fib, h/o left atrial thrombus (on xarelto), chronic systolic HF (EF 38-75%), CAD, prior small strokes  Procedures:  10/29: Left common carotid arteriogram, partial recanalization of occluded left MCA distal M2 inferior division    Significant Diagnostic Tests:  10/29: Head CT: No acute intracranial abnormality, old bilateral PCA and right pelvic infarcts CT angiogram  10/29: Occlusion of the left middle cerebral artery inferior division at mid M2 segment area of ischemic penumbra demonstrated by CT perfusion Postprocedural CT brain  10/29: Mild to moderate left perisylvian subarachnoid hemorrhage without mass-effect or shift ECHO 10/29>>>1. No LV thrombus seen. There is akinesis of the septum into the apex concerning for prior infarction. Abnormal septal motion consistent with LBBB. Severely reduced LVEF, EF 15-20%. LVOT VTI 10.6 cm with estimated cardiac output 1.7 L/min (1.0 L/min/m2). 2. Left ventricular ejection fraction, by visual estimation, is <20%. The left ventricle has severely decreased function. There is no left ventricular hypertrophy. 3. Definity contrast agent was given IV to delineate the left ventricular endocardial borders. 4. Abnormal septal motion consistent with left bundle branch block.  5. Elevated left atrial pressure. 6. Left ventricular diastolic parameters are consistent with Grade III diastolic dysfunction (restrictive). 7. Severely dilated left ventricular internal cavity size. 8. The left ventricle demonstrates global hypokinesis. 9. Global right ventricle has moderately reduced systolic function.The right ventricular size is normal. No increase in right ventricular wall thickness. 10. Left atrial size was moderately dilated. 11. Mild mitral annular calcification. 12. The mitral valve is grossly normal. Moderate mitral valve regurgitation. 13. Severe restricted movement of PMVL with at least moderate (maybe severe MR, but poorly quantitated). 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 MRI brain 10/29>>>   Hospital course   10/29  admitted 10/30 stable overnight. Extubated Micro Data:  covid 19 10/29: neg   Antimicrobials:      Interim history/subjective:  Extubated; denies SOB, CP, HA   Objective   BP 98/72   Pulse 80   Temp 99 F (37.2 C) (Oral)   Resp 14   Ht 5\' 8"  (1.727 m)   Wt 65.6 kg   SpO2 99%   BMI 21.99 kg/m     Filed Weights   05/02/19 0000 05/03/19 0345 05/04/19 0500  Weight: 66 kg 66.3 kg 65.6 kg    Intake/Output Summary (Last 24 hours) at 05/04/2019 1029 Last data filed at 05/04/2019 0600 Gross per 24 hour  Intake 1540.68 ml  Output 1000 ml  Net 540.68 ml    FiO2 (%):  [50 %] 50 %   Examination:  IEP:PIRJ is a 43 year old white male admitted s/p stroke.  HENT: NCAT no JVD MMM orally intubated. Pupils not icteric  PULM:  CTAB CV: irreg, but ~60s ABD SNTND NEURO: CNII-XII grossly intact to confrontation. conversant, follows requests, including MAE; , can recall name, eventually DOB, recalls location after reminder,   Resolved Hospital Problem list  Assessment & Plan:  Acute MCA stroke c/b post-procedural SAH s/p revascularization in IR Plan SBP goals 120-140; can likely liberalize this today (will defer to neuro  teams) Cont serial neuro checks Will eventually need MRI   Acute respiratory failure w/ ventilator dependence 2/2 stroke and subsequent SAH -h/o tobacco abuse  pcxr basilar atx but improved. Support tubes and lines stable Extubated yesterday with no issues today. Plan  Titrate oxygen  Bradycardia and Hypotension etiology not clear; favor sedation related Plan Cont tele  Holding BB  Acute on chronic ICM (EF 20-25% now down to 15-20%); h/o  atrial fib and LV thrombus (had been on xarelto; thrombus resolved) now w/ concern for new NSTEMI (increased trop I and EKG changes) Plan Cont tele Holding AC due to [facility] BUT we will need to determine when this can be resumed  Holding BB given hypotension Will eventually need cards eval  At risk for fluid and electrolyte imbalance Plan Trend chemistries   At risk for anemia  hgb stable overnight  Plan Trend cbc; this should be minimally every 8 hrs once we resume ac   Coagulopathy  -INR 3.4 -->1.4 Plan Trend INR   Best practice:  Diet: NPO for now; advance diet per primary team Pain/Anxiety/Delirium protocol (if indicated): YES VAP protocol (if indicated): YES DVT prophylaxis: SCDs GI prophylaxis: Protonix Glucose control: per protocol Mobility/Activity: Bed Rest CODE STATUS:   Code Status: Full Code Family Communication:  No family at the bedside. Disposition: Keep in ICU.   Will sign off for now, but remain available as needed.    I have independently seen and examined the patient, reviewed data, and developed an assessment and plan. A total of 31 minutes were spent in critical care assessment and medical decision making. This critical care time does not reflect procedure time, or teaching time or supervisory time of PA/NP/Med student/Med Resident, etc but could involve care discussion time.  Gwynne Edinger, MD PhD 05/04/19 10:34 AM

## 2019-05-04 NOTE — Progress Notes (Signed)
Patient arrived from 4N at approximately 1855, vitals stable, patient in no immediate pain.  Patient placed on telemetry.

## 2019-05-04 NOTE — Evaluation (Signed)
Physical Therapy Evaluation/ Discharge Patient Details Name: Logan [name]. MRN: [id number] DOB: 1975-12-23 Today's Date: 05/04/2019   History of Present Illness  43 yo admitted with aphasia and Rt weakness with Left MCA occlusion s/[name] thrombectomy. PMhx: Afib, CAD, CVA, heart failure, ICD  Clinical Impression  Pt awake, alert and pleasant throughout session. Pt able to follow all single and 2 step commands without difficulty but did not attempt multi step directions. Pt unable to state current city but that he is from [place] and correctly stated "Trump" as president. Pt with noted expressive aphasia as greatest deficit with difficulty discerning true home setup and assist but per pt current report he has assist of family and does not work or drive. He enjoys yard work and has several kids none of which live with him. Pt with 5/5 bil LE strength, good balance and steady gait with noted rise in HR to 135 with gait. BP stable throughout and pt maintaining 93% on RA. No further acute [name].T. needs at this time and will defer to SLP/OT.   99/78 (85) supine 101/86 (92) sitting 123/88  Standing HR 107-135 SpO2 93% RA    Follow Up Recommendations Supervision/Assistance - 24 hour    Equipment Recommendations  None recommended by PT    Recommendations for Other Services Speech consult;OT consult     Precautions / Restrictions Precautions Precaution Comments: watch HR      Mobility  Bed Mobility Overal bed mobility: Modified Independent             General bed mobility comments: pt able to transition to EOB without assist  Transfers Overall transfer level: Modified independent               General transfer comment: pt with good technique and balance with sitting and standing  Ambulation/Gait   Gait Distance (Feet): 250 Feet Assistive device: None Gait Pattern/deviations: WFL(Within Functional Limits)   Gait velocity interpretation: >4.37 ft/sec, indicative of normal  walking speed General Gait Details: pt able to perform head turns and gait speed changes without difficulty. Pt with SpO2 93% on rA during gait with HR up to 135  Stairs Stairs: Yes Stairs assistance: Modified independent (Device/Increase time) Stair Management: Alternating pattern;Forwards;One rail Left Number of Stairs: 5    Wheelchair Mobility    Modified Rankin (Stroke Patients Only) Modified Rankin (Stroke Patients Only) Pre-Morbid Rankin Score: No symptoms Modified Rankin: Moderately severe disability     Balance Overall balance assessment: No apparent balance deficits (not formally assessed)                                           Pertinent Vitals/Pain Pain Assessment: No/denies pain    Home Living Family/patient expects to be discharged to:: Private residence Living Arrangements: Other relatives Available Help at Discharge: Family;Available 24 hours/day Type of Home: House Home Access: Stairs to enter Entrance Stairs-Rails: Right Entrance Stairs-Number of Steps: 5 Home Layout: One level Home Equipment: None      Prior Function Level of Independence: Independent         Comments: pt reports he cares for himself but doesn't work or drive and lives with aunt/uncle. Has several kids but number 3-4 varied during session as did ages given pt aphasia     Hand Dominance        Extremity/Trunk Assessment   Upper Extremity Assessment Upper Extremity  Assessment: Overall WFL for tasks assessed    Lower Extremity Assessment Lower Extremity Assessment: (5/5 bil LE strength with intact sensation)    Cervical / Trunk Assessment Cervical / Trunk Assessment: Normal  Communication   Communication: Expressive difficulties  Cognition Arousal/Alertness: Awake/alert Behavior During Therapy: WFL for tasks assessed/performed Overall Cognitive Status: Difficult to assess                                        General Comments       Exercises     Assessment/Plan    PT Assessment Patent does not need any further PT services  PT Problem List         PT Treatment Interventions      PT Goals (Current goals can be found in the Care Plan section)  Acute Rehab PT Goals PT Goal Formulation: All assessment and education complete, DC therapy    Frequency     Barriers to discharge        Co-evaluation               AM-PAC PT "6 Clicks" Mobility  Outcome Measure Help needed turning from your back to your side while in a flat bed without using bedrails?: None Help needed moving from lying on your back to sitting on the side of a flat bed without using bedrails?: None Help needed moving to and from a bed to a chair (including a wheelchair)?: None Help needed standing up from a chair using your arms (e.g., wheelchair or bedside chair)?: None Help needed to walk in hospital room?: A Little Help needed climbing 3-5 steps with a railing? : A Little 6 Click Score: 22    End of Session Equipment Utilized During Treatment: Gait belt Activity Tolerance: Patient tolerated treatment well Patient left: in chair;with chair alarm set;with call bell/phone within reach Nurse Communication: Mobility status PT Visit Diagnosis: Other symptoms and signs involving the nervous system (R29.898)    Time: 2297-9892 PT Time Calculation (min) (ACUTE ONLY): 26 min   Charges:   PT Evaluation $PT Eval Moderate Complexity: 1 Mod          Logan [name], PT Acute Rehabilitation Services Pager: [phone] Office: [phone]   Logan [name] Logan [name] 05/04/2019, 12:20 PM

## 2019-05-04 NOTE — Progress Notes (Signed)
STROKE TEAM PROGRESS NOTE   INTERVAL HISTORY Pt sitting in bed, still has mild aphasia, moving all extremities equally. Stated compliance with medication. However, when ask how much copay for Xarelto, he did not know. He lives with aunt and uncle.   Vitals:   05/04/19 0500 05/04/19 0600 05/04/19 0800 05/04/19 0909  BP: 98/65 98/72    Pulse: 75 80    Resp: 16 14    Temp:   99 F (37.2 C)   TempSrc:   Oral   SpO2: 97% 100%  99%  Weight: 65.6 kg     Height:        CBC:  Recent Labs  Lab 05/02/19 0033  05/02/19 0500  05/03/19 0435 05/04/19 0509  WBC 6.1  --  6.2  --  9.4 5.7  NEUTROABS 3.2  --  4.6  --   --   --   HGB 13.8   < > 12.6*   < > 12.2* 11.0*  HCT 39.5   < > 35.5*   < > 36.5* 31.7*  MCV 100.0  --  100.0  --  104.3* 101.3*  PLT 224  --  215  --  223 163   < > = values in this interval not displayed.    Basic Metabolic Panel:  Recent Labs  Lab 05/03/19 0435 05/04/19 0509  NA 139 141  K 4.0 4.0  CL 109 111  CO2 19* 19*  GLUCOSE 123* 101*  BUN 10 12  CREATININE 0.76 0.78  CALCIUM 8.5* 8.8*  MG 1.9 1.9  PHOS 3.7 3.6   Lipid Panel:     Component Value Date/Time   CHOL 122 05/02/2019 0500   TRIG 34 05/02/2019 0500   HDL 31 (L) 05/02/2019 0500   CHOLHDL 3.9 05/02/2019 0500   VLDL 7 05/02/2019 0500   LDLCALC 84 05/02/2019 0500   HgbA1c:  Lab Results  Component Value Date   HGBA1C 5.4 05/02/2019   Urine Drug Screen:     Component Value Date/Time   LABOPIA NONE DETECTED 05/02/2019 0756   COCAINSCRNUR NONE DETECTED 05/02/2019 0756   LABBENZ NONE DETECTED 05/02/2019 0756   AMPHETMU NONE DETECTED 05/02/2019 0756   THCU NONE DETECTED 05/02/2019 0756   LABBARB NONE DETECTED 05/02/2019 0756    Alcohol Level No results found for: ETH  IMAGING  Ct Head Wo Contrast  Result Date: 05/03/2019 CLINICAL DATA:  Follow-up stroke. Thrombectomy. Intracranial hemorrhage. EXAM: CT HEAD WITHOUT CONTRAST TECHNIQUE: Contiguous axial images were obtained from the  base of the skull through the vertex without intravenous contrast. COMPARISON:  CT studies yesterday. FINDINGS: Brain: Slight reduction in the amount of visible blood the sylvian fissure on the left. Some swelling and loss of gray-white differentiation in the left posterior temporal and parietal region consistent with infarction. No other acute finding. Old infarctions in the right cerebellum and right occipital lobe with old lateral thalamic infarction on the right. Late subacute to old infarction in the left occipital lobe is unchanged. This was not present on the study of April 2019. This was not described on the CT study of 09/18/2018, though the images are not viewable. Vascular: No acute vascular finding. Skull: Negative Sinuses/Orbits: Clear/normal Other: None IMPRESSION: Diminishing density of the left sylvian fissure hemorrhage. Mild swelling and loss of gray-white differentiation in the left posterior temporal and parietal region consistent with infarction. No mass effect. Old right cerebellar and right occipital infarctions appear the same. Late subacute to old left occipital infarction appears  the same. Electronically Signed   By: Paulina FusiMark  Shogry M.D.   On: 05/03/2019 14:33   Ct Head Wo Contrast  Result Date: 05/02/2019 CLINICAL DATA:  Status post thrombectomy EXAM: CT HEAD WITHOUT CONTRAST TECHNIQUE: Contiguous axial images were obtained from the base of the skull through the vertex without intravenous contrast. COMPARISON:  CT and CTA from earlier today. Limited intraoperative CT from earlier today FINDINGS: Brain: Unchanged contrast or subarachnoid hemorrhage in the left sylvian fissure. No superimposed infarct is detected. Remote bilateral occipital and right thalamic infarcts. Remote right superior cerebellar artery infarct. No hydrocephalus or shift. Vascular: No focal arterial hyperdensity. Skull: Negative Sinuses/Orbits: Nasopharyngeal fluid in the setting of intubation IMPRESSION: 1. Unchanged  subarachnoid blood and/or contrast in the left sylvian fissure when compared to intraoperative CT. 2. No detectable MCA infarct. 3. Remote posterior circulation infarcts. Electronically Signed   By: Marnee SpringJonathon  Watts M.D.   On: 05/02/2019 09:37   Dg Chest Port 1 View  Result Date: 05/03/2019 CLINICAL DATA:  Acute respiratory failure with hypoxia. Intubated. EXAM: PORTABLE CHEST 1 VIEW COMPARISON:  05/02/2019 FINDINGS: Endotracheal tube in satisfactory position. Nasogastric tube tip and side hole in the proximal stomach. 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 Stable enlarged cardiac silhouette. Improved aeration at the lung bases with minimal residual linear atelectasis at the right lung base. Otherwise, clear lungs. Unremarkable bones. IMPRESSION: 1. Stable cardiomegaly. 2. Improved bibasilar atelectasis. Electronically Signed   By: Beckie SaltsSteven  Reid M.D.   On: 05/03/2019 06:57   Dg Abd Portable 1v  Result Date: 05/02/2019 CLINICAL DATA:  Gastric tube placement EXAM: PORTABLE ABDOMEN - 1 VIEW COMPARISON:  05/02/2019 FINDINGS: Gastric tube is been advanced and is now coiled in the stomach with the tip in the fundus of the stomach. Normal bowel gas pattern. Visualized lungs are clear. IMPRESSION: NG tube coiled in the stomach with the tip in the fundus. Electronically Signed   By: Marlan Palauharles  Clark M.D.   On: 05/02/2019 13:55   Dg Abd Portable 1 View  Result Date: 05/02/2019 CLINICAL DATA:  Orogastric tube placement. EXAM: PORTABLE ABDOMEN - 1 VIEW COMPARISON:  Same day. FINDINGS: Distal tip of nasogastric tube is seen in distal esophagus. It is not significantly changed compared to prior exam. No abnormal bowel dilatation is noted. IMPRESSION: Distal tip of nasogastric tube is seen in distal esophagus and is unchanged compared to prior exam. Advancement is recommended. Electronically Signed   By: Lupita RaiderJames  Green Jr M.D.   On: 05/02/2019 13:53   Dg Abd Portable 1 View  Result Date:  05/02/2019 CLINICAL DATA:  NG tube placement. EXAM: PORTABLE ABDOMEN - 1 VIEW COMPARISON:  None. FINDINGS: The NG tube tip is in the distal esophagus and needs to be advanced several cm. The heart is mildly enlarged. The lung bases are grossly clear. The abdominal bowel gas pattern is unremarkable. IMPRESSION: The NG tube tip is in the distal esophagus and needs to be advanced several cm. Electronically Signed   By: Rudie MeyerP.  Gallerani M.D.   On: 05/02/2019 10:45   Cerebral Angio 05/02/2019 S/P LT common carotid arteriogram followed by partial recanalization of occluded Lt MCA distal M2 inf division with x 1 pass wit solitairex 4mm x 40 mm X retrieval device and penumbra aspiration Achieving a TICI 2c revascularization.  2D Echocardiogram  1. No LV thrombus seen. There is akinesis of the septum into the apex concerning for prior infarction. Abnormal septal motion consistent with LBBB. Severely reduced LVEF, EF 15-20%. LVOT  VTI 10.6 cm with estimated cardiac output 1.7 L/min (1.0  L/min/m2).  2. Left ventricular ejection fraction, by visual estimation, is <20%. The left ventricle has severely decreased function. There is no left ventricular hypertrophy.  3. Definity contrast agent was given IV to delineate the left ventricular endocardial borders.  4. Abnormal septal motion consistent with left bundle branch block.  5. Elevated left atrial pressure.  6. Left ventricular diastolic parameters are consistent with Grade III diastolic dysfunction (restrictive).  7. Severely dilated left ventricular internal cavity size.  8. The left ventricle demonstrates global hypokinesis.  9. Global right ventricle has moderately reduced systolic function.The right ventricular size is normal. No increase in right ventricular wall thickness. 10. Left atrial size was moderately dilated. 11. Mild mitral annular calcification. 12. The mitral valve is grossly normal. Moderate mitral valve regurgitation. 13. Severe restricted  movement of PMVL with at least moderate (maybe severe MR, but poorly quantitated). 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 14. The tricuspid valve is grossly normal. Tricuspid valve regurgitation is trivial. 15. The aortic valve is tricuspid. Aortic valve regurgitation is not visualized. No evidence of aortic valve sclerosis or stenosis. 16. The pulmonic valve was grossly normal. Pulmonic valve regurgitation is not visualized. 17. The inferior vena cava IVC unable to be assessed due to ventilator therapy. 18. Right atrial size was normal.   PHYSICAL EXAM   Temp:  [98.1 F (36.7 C)-99.1 F (37.3 C)] 99 F (37.2 C) (10/31 0800) Pulse Rate:  [48-128] 80 (10/31 0600) Resp:  [14-27] 14 (10/31 0600) BP: (95-136)/(65-88) 98/72 (10/31 0600) SpO2:  [87 %-100 %] 99 % (10/31 0909) Arterial Line BP: (113-130)/(59-76) 120/67 (10/30 1600) FiO2 (%):  [50 %] 50 % (10/30 1220) Weight:  [65.6 kg] 65.6 kg (10/31 0500)  General - Well nourished, well developed, in no apparent distress.  Ophthalmologic - fundi not visualized due to noncooperation.  Cardiovascular - Regular rhythm and rate.  Mental Status -  Level of arousal and orientation to year, date, place, and age were intact, however, not orientated to month. Following all simple commands, however, nonfluent speech with hesitation, naming 3/5, difficulty with repeating sentences  Cranial Nerves II - XII - II - Visual field intact OU. III, IV, VI - Extraocular movements intact. V - Facial sensation intact bilaterally. VII - Facial movement intact bilaterally. VIII - Hearing & vestibular intact bilaterally. X - Palate elevates symmetrically. XI - Chin turning & shoulder shrug intact bilaterally. XII - Tongue protrusion intact.  Motor Strength - The patient's strength was normal in all extremities and pronator drift was absent.  Bulk was normal and fasciculations were absent.   Motor Tone - Muscle tone was assessed at the neck and appendages and  was normal.  Reflexes - The patient's reflexes were symmetrical in all extremities and he had no pathological reflexes.  Sensory - Light touch, temperature/pinprick were assessed and were symmetrical.    Coordination - The patient had normal movements in the hands with no ataxia or dysmetria.  Tremor was absent.  Gait and Station - deferred.   ASSESSMENT/PLAN [name]. is a 43 y.o. male with history of AF and LV thrombus on [facility], CHF, cardiomyopathy , prior stroke presenting nonverbal w/ R sided weakness. No tPA as on xarelto. 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 Old B PCA and R thalamic  infarcts. ASPECTS 10.     CTA head & neck occlusion L MCA inferior M2 division.  CT perfusion 67 mL penumbnra  Cerebral angio TICI2c recanalization of occluded L M2  Post IR CT mild to moderate L perisylvian SAH  Repeat CT head unchanged SAH, old posterior circulation infarcts   MRI  pending  2D Echo EF 15-20%. No LV thrombus, no source of embolus   LDL 84  HgbA1c 5.4  SCDs for VTE prophylaxis  Xarelto (rivaroxaban) daily prior to admission, now on ASA 81mg . Holding off anticoagulation now due to [facility] post procedure.   Therapy recommendations: none  Disposition:  pending   Atrial Fibrillation H/o LV thrombus  History of ischemic cardiomyopathy and left ventricular mural thrombus in 2014   Home anticoagulation:  Xarelto (rivaroxaban) daily   Off AC at this time d/t [facility]  Now on ASA 81  May consider coumadin due to failure of DOAC several times  CHF and cardiomyopathy  01/2016 EF 25%  On this admission EF 15 to 20%  Home medications including Lasix, losartan, spironolactone  Has been following with CHF clinic [place]  CXR unremarkable  BP soft  We will consider cardiology consult  History of stroke  01/2016 - Probable R brain TIA, CTA head and  neck negative.  EF 25%.  LDL 170.  Put on aspirin 325 and Xarelto  03/2017 -admitted for left-sided weakness and neglect.  CT showed right superior cerebellar infarct and right P2 hyperdense.  CT head and neck right P2 occlusion status post IR with TICI3 reperfusion.  MRI showed right SCA and right PCA infarct with small left PCA and right PICA and right thalamic infarcts.  LDL 113 and A1c 5.1.  Patient continued on aspirin and Xarelto  10/2017 - likely TIA, LDL 149 and continued on Xarelto  12/2017 - likely TIA. on xarelto, continued.  Hypotension due to cardiomyopathy Hx Hypertension  Home meds:  Lasix 20, losartan 25 bid, spironolactone 25  Off neo  BP goal < 160  Currently BP on the low end likely due to CHF  Long-term BP goal normotensive  Hyperlipidemia  Home meds:  lipitor 80  LDL 84, goal < 70  Resumed lipitor 80  Continue statin at discharge  Acute blood loss Anemia   R groin bleeding from sheath site, Also post IR  Hb 12.6-11.6-12.2->11.0  Close monitoring  Tobacco abuse  Current smoker  Smoking cessation counseling provided  Pt is willing to quit  Other Stroke Risk Factors  ETOH use  Hx Substance abuse marijuana  Coronary artery disease s/p DES LAD 2013, reinfarct w/ in-stent thrombus, s/p CABG 3v 2014  Other Active Problems  History of medical noncompliance  Severe mitral regurg, mild TR  Hospital day # 2  This patient is critically ill and at significant risk of neurological worsening, death and care requires constant monitoring of vital signs, hemodynamics,respiratory and cardiac monitoring, extensive review of multiple databases, frequent neurological assessment, discussion with family, other specialists and medical decision making of high complexity. I spent 35 minutes of neurocritical care time  in the care of  this patient.  Rosalin Hawking, MD PhD Stroke Neurology 05/04/2019 7:49 PM   To contact Stroke Continuity provider, please refer  to [email or web address] After hours, contact General Neurology

## 2019-05-04 NOTE — Progress Notes (Signed)
Referring Physician(s): Dr Noah Charon  Supervising Physician: Luanne Bras  Patient Status:  [facility] - In-pt  Chief Complaint:  CVA 10/29: LT common carotid arteriogram followed by partial recanalization of occluded Lt MCA distal M2 inf division with x 1 pass wit solitairex 57mm x 40 mm X retrieval device and penumbra aspiration Achieving a TICI 2c revascularization.  Subjective:  Up in bed Able to talk with me Can say name When asked DOB-- he repeats name Follows my commands moving all 4s   Allergies: Patient has no known allergies.  Medications: Prior to Admission medications   Medication Sig Start Date End Date Taking? Authorizing Provider  atorvastatin (LIPITOR) 80 MG tablet Take 80 mg by mouth at bedtime. 03/25/19  Yes [provider]  furosemide (LASIX) 20 MG tablet Take 80 mg by mouth daily.  04/29/19  Yes [provider]  losartan (COZAAR) 25 MG tablet Take 25 mg by mouth daily. 03/25/19  Yes [provider]  spironolactone (ALDACTONE) 25 MG tablet Take 25 mg by mouth daily. 04/29/19  Yes [provider]  XARELTO 20 MG TABS tablet Take 20 mg by mouth daily. 04/23/19  Yes [provider]     Vital Signs: BP 98/72    Pulse 80    Temp 99 F (37.2 C) (Oral)    Resp 14    Ht 5\' 8"  (1.727 m)    Wt 144 lb 10 oz (65.6 kg)    SpO2 99%    BMI 21.99 kg/m   Physical Exam HENT:     Head:     Comments: Face symmetrical Tongue midline Shows teeth and puffs cheeks Musculoskeletal:     Comments: Follows commands Moves all 4s -- slowly  Skin:    General: Skin is warm and dry.     Comments: Rt groin NT no bleeding no hematoma Rt foot- 2+ pulses    Neurological:     Mental Status: He is alert.  Psychiatric:     Comments: Can answer and say name But when asked to say DOB-- he repeated his name     Imaging: Ct Angio Head W Or Wo Contrast  Result Date: 05/02/2019 EXAM: CT ANGIOGRAPHY HEAD AND NECK CT PERFUSION BRAIN  TECHNIQUE: Multidetector CT imaging of the head and neck was performed using the standard protocol during bolus administration of intravenous contrast. Multiplanar CT image reconstructions and MIPs were obtained to evaluate the vascular anatomy. Carotid stenosis measurements (when applicable) are obtained utilizing NASCET criteria, using the distal internal carotid diameter as the denominator. Multiphase CT imaging of the brain was performed following IV bolus contrast injection. Subsequent parametric perfusion maps were calculated using RAPID software. CONTRAST:  60mL OMNIPAQUE IOHEXOL 350 MG/ML SOLN; 74mL OMNIPAQUE IOHEXOL 350 MG/ML SOLN COMPARISON:  CTA head and neck September 18, 2018 FINDINGS: CTA NECK FINDINGS SKELETON: There is no bony spinal canal stenosis. No lytic or blastic lesion. OTHER NECK: Normal pharynx, larynx and major salivary glands. No cervical lymphadenopathy. Unremarkable thyroid gland. UPPER CHEST: No pneumothorax or pleural effusion. No nodules or masses. AORTIC ARCH: There is no calcific atherosclerosis of the aortic arch. There is no aneurysm, dissection or hemodynamically significant stenosis of the visualized portion of the aorta. Conventional 3 vessel aortic branching pattern. The visualized proximal subclavian arteries are widely patent. RIGHT CAROTID SYSTEM: No dissection, occlusion or aneurysm. Mild atherosclerotic calcification at the carotid bifurcation without hemodynamically significant stenosis. LEFT CAROTID SYSTEM: No dissection, occlusion or aneurysm. Mild atherosclerotic calcification at the carotid  bifurcation without hemodynamically significant stenosis. VERTEBRAL ARTERIES: Codominant configuration. Both origins are clearly patent. There is no dissection, occlusion or flow-limiting stenosis to the skull base (V1-V3 segments). CTA HEAD FINDINGS POSTERIOR CIRCULATION: --Vertebral arteries: Normal V4 segments. --Posterior inferior cerebellar arteries (PICA): Patent origins from  the vertebral arteries. --Anterior inferior cerebellar arteries (AICA): Patent origins from the basilar artery. --Basilar artery: Normal. --Superior cerebellar arteries: Normal. --Posterior cerebral arteries: Both are diminutive, in keeping with remote bilateral PCA territory infarcts. ANTERIOR CIRCULATION: --Intracranial internal carotid arteries: Normal. --Anterior cerebral arteries (ACA): Normal. Both A1 segments are present. Patent anterior communicating artery (a-comm). --Middle cerebral arteries (MCA): There is occlusion the left M2 inferior division midportion (series 9, image 98). The middle cerebral arteries are otherwise normal. VENOUS SINUSES: As permitted by contrast timing, patent. ANATOMIC VARIANTS: None Review of the MIP images confirms the above findings. CT Brain Perfusion Findings: ASPECTS: 10 CBF (<30%) Volume: 0mL Perfusion (Tmax>6.0s) volume: 67mL Mismatch Volume: 67mL Infarction Location:Posterior left MCA territory. IMPRESSION: 1. Occlusion of the left middle cerebral artery inferior division mid M2 segment. 2. 67 mL area of ischemic penumbra demonstrated by CT perfusion analysis. Critical Value/emergent results were called by telephone at the time of interpretation on 05/02/2019 at 1:07 am to [facility] , who verbally acknowledged these results. Electronically Signed   By: Deatra RobinsonKevin  Herman M.D.   On: 05/02/2019 01:26   Ct Head Wo Contrast  Result Date: 05/03/2019 CLINICAL DATA:  Follow-up stroke. Thrombectomy. Intracranial hemorrhage. EXAM: CT HEAD WITHOUT CONTRAST TECHNIQUE: Contiguous axial images were obtained from the base of the skull through the vertex without intravenous contrast. COMPARISON:  CT studies yesterday. FINDINGS: Brain: Slight reduction in the amount of visible blood the sylvian fissure on the left. Some swelling and loss of gray-white differentiation in the left posterior temporal and parietal region consistent with infarction. No other acute finding. Old  infarctions in the right cerebellum and right occipital lobe with old lateral thalamic infarction on the right. Late subacute to old infarction in the left occipital lobe is unchanged. This was not present on the study of April 2019. This was not described on the CT study of 09/18/2018, though the images are not viewable. Vascular: No acute vascular finding. Skull: Negative Sinuses/Orbits: Clear/normal Other: None IMPRESSION: Diminishing density of the left sylvian fissure hemorrhage. Mild swelling and loss of gray-white differentiation in the left posterior temporal and parietal region consistent with infarction. No mass effect. Old right cerebellar and right occipital infarctions appear the same. Late subacute to old left occipital infarction appears the same. Electronically Signed   By: Paulina FusiMark  Shogry M.D.   On: 05/03/2019 14:33   Ct Head Wo Contrast  Result Date: 05/02/2019 CLINICAL DATA:  Status post thrombectomy EXAM: CT HEAD WITHOUT CONTRAST TECHNIQUE: Contiguous axial images were obtained from the base of the skull through the vertex without intravenous contrast. COMPARISON:  CT and CTA from earlier today. Limited intraoperative CT from earlier today FINDINGS: Brain: Unchanged contrast or subarachnoid hemorrhage in the left sylvian fissure. No superimposed infarct is detected. Remote bilateral occipital and right thalamic infarcts. Remote right superior cerebellar artery infarct. No hydrocephalus or shift. Vascular: No focal arterial hyperdensity. Skull: Negative Sinuses/Orbits: Nasopharyngeal fluid in the setting of intubation IMPRESSION: 1. Unchanged subarachnoid blood and/or contrast in the left sylvian fissure when compared to intraoperative CT. 2. No detectable MCA infarct. 3. Remote posterior circulation infarcts. Electronically Signed   By: Marnee SpringJonathon  Watts M.D.   On: 05/02/2019 09:37   Ct Angio Neck  W Or Wo Contrast  Result Date: 05/02/2019 EXAM: CT ANGIOGRAPHY HEAD AND NECK CT PERFUSION BRAIN  TECHNIQUE: Multidetector CT imaging of the head and neck was performed using the standard protocol during bolus administration of intravenous contrast. Multiplanar CT image reconstructions and MIPs were obtained to evaluate the vascular anatomy. Carotid stenosis measurements (when applicable) are obtained utilizing NASCET criteria, using the distal internal carotid diameter as the denominator. Multiphase CT imaging of the brain was performed following IV bolus contrast injection. Subsequent parametric perfusion maps were calculated using RAPID software. CONTRAST:  24mL OMNIPAQUE IOHEXOL 350 MG/ML SOLN; 20mL OMNIPAQUE IOHEXOL 350 MG/ML SOLN COMPARISON:  CTA head and neck September 18, 2018 FINDINGS: CTA NECK FINDINGS SKELETON: There is no bony spinal canal stenosis. No lytic or blastic lesion. OTHER NECK: Normal pharynx, larynx and major salivary glands. No cervical lymphadenopathy. Unremarkable thyroid gland. UPPER CHEST: No pneumothorax or pleural effusion. No nodules or masses. AORTIC ARCH: There is no calcific atherosclerosis of the aortic arch. There is no aneurysm, dissection or hemodynamically significant stenosis of the visualized portion of the aorta. Conventional 3 vessel aortic branching pattern. The visualized proximal subclavian arteries are widely patent. RIGHT CAROTID SYSTEM: No dissection, occlusion or aneurysm. Mild atherosclerotic calcification at the carotid bifurcation without hemodynamically significant stenosis. LEFT CAROTID SYSTEM: No dissection, occlusion or aneurysm. Mild atherosclerotic calcification at the carotid bifurcation without hemodynamically significant stenosis. VERTEBRAL ARTERIES: Codominant configuration. Both origins are clearly patent. There is no dissection, occlusion or flow-limiting stenosis to the skull base (V1-V3 segments). CTA HEAD FINDINGS POSTERIOR CIRCULATION: --Vertebral arteries: Normal V4 segments. --Posterior inferior cerebellar arteries (PICA): Patent origins from  the vertebral arteries. --Anterior inferior cerebellar arteries (AICA): Patent origins from the basilar artery. --Basilar artery: Normal. --Superior cerebellar arteries: Normal. --Posterior cerebral arteries: Both are diminutive, in keeping with remote bilateral PCA territory infarcts. ANTERIOR CIRCULATION: --Intracranial internal carotid arteries: Normal. --Anterior cerebral arteries (ACA): Normal. Both A1 segments are present. Patent anterior communicating artery (a-comm). --Middle cerebral arteries (MCA): There is occlusion the left M2 inferior division midportion (series 9, image 98). The middle cerebral arteries are otherwise normal. VENOUS SINUSES: As permitted by contrast timing, patent. ANATOMIC VARIANTS: None Review of the MIP images confirms the above findings. CT Brain Perfusion Findings: ASPECTS: 10 CBF (<30%) Volume: 35mL Perfusion (Tmax>6.0s) volume: 20mL Mismatch Volume: 61mL Infarction Location:Posterior left MCA territory. IMPRESSION: 1. Occlusion of the left middle cerebral artery inferior division mid M2 segment. 2. 67 mL area of ischemic penumbra demonstrated by CT perfusion analysis. Critical Value/emergent results were called by telephone at the time of interpretation on 05/02/2019 at 1:07 am to [facility] , who verbally acknowledged these results. Electronically Signed   By: Deatra Robinson M.D.   On: 05/02/2019 01:26   Ct Cerebral Perfusion W Contrast  Result Date: 05/02/2019 EXAM: CT ANGIOGRAPHY HEAD AND NECK CT PERFUSION BRAIN TECHNIQUE: Multidetector CT imaging of the head and neck was performed using the standard protocol during bolus administration of intravenous contrast. Multiplanar CT image reconstructions and MIPs were obtained to evaluate the vascular anatomy. Carotid stenosis measurements (when applicable) are obtained utilizing NASCET criteria, using the distal internal carotid diameter as the denominator. Multiphase CT imaging of the brain was performed following IV  bolus contrast injection. Subsequent parametric perfusion maps were calculated using RAPID software. CONTRAST:  24mL OMNIPAQUE IOHEXOL 350 MG/ML SOLN; 46mL OMNIPAQUE IOHEXOL 350 MG/ML SOLN COMPARISON:  CTA head and neck September 18, 2018 FINDINGS: CTA NECK FINDINGS SKELETON: There is no bony spinal canal stenosis.  No lytic or blastic lesion. OTHER NECK: Normal pharynx, larynx and major salivary glands. No cervical lymphadenopathy. Unremarkable thyroid gland. UPPER CHEST: No pneumothorax or pleural effusion. No nodules or masses. AORTIC ARCH: There is no calcific atherosclerosis of the aortic arch. There is no aneurysm, dissection or hemodynamically significant stenosis of the visualized portion of the aorta. Conventional 3 vessel aortic branching pattern. The visualized proximal subclavian arteries are widely patent. RIGHT CAROTID SYSTEM: No dissection, occlusion or aneurysm. Mild atherosclerotic calcification at the carotid bifurcation without hemodynamically significant stenosis. LEFT CAROTID SYSTEM: No dissection, occlusion or aneurysm. Mild atherosclerotic calcification at the carotid bifurcation without hemodynamically significant stenosis. VERTEBRAL ARTERIES: Codominant configuration. Both origins are clearly patent. There is no dissection, occlusion or flow-limiting stenosis to the skull base (V1-V3 segments). CTA HEAD FINDINGS POSTERIOR CIRCULATION: --Vertebral arteries: Normal V4 segments. --Posterior inferior cerebellar arteries (PICA): Patent origins from the vertebral arteries. --Anterior inferior cerebellar arteries (AICA): Patent origins from the basilar artery. --Basilar artery: Normal. --Superior cerebellar arteries: Normal. --Posterior cerebral arteries: Both are diminutive, in keeping with remote bilateral PCA territory infarcts. ANTERIOR CIRCULATION: --Intracranial internal carotid arteries: Normal. --Anterior cerebral arteries (ACA): Normal. Both A1 segments are present. Patent anterior  communicating artery (a-comm). --Middle cerebral arteries (MCA): There is occlusion the left M2 inferior division midportion (series 9, image 98). The middle cerebral arteries are otherwise normal. VENOUS SINUSES: As permitted by contrast timing, patent. ANATOMIC VARIANTS: None Review of the MIP images confirms the above findings. CT Brain Perfusion Findings: ASPECTS: 10 CBF (<30%) Volume: 0mL Perfusion (Tmax>6.0s) volume: 67mL Mismatch Volume: 67mL Infarction Location:Posterior left MCA territory. IMPRESSION: 1. Occlusion of the left middle cerebral artery inferior division mid M2 segment. 2. 67 mL area of ischemic penumbra demonstrated by CT perfusion analysis. Critical Value/emergent results were called by telephone at the time of interpretation on 05/02/2019 at 1:07 am to [facility] , who verbally acknowledged these results. Electronically Signed   By: Deatra RobinsonKevin  Herman M.D.   On: 05/02/2019 01:26   Dg Chest Port 1 View  Result Date: 05/03/2019 CLINICAL DATA:  Acute respiratory failure with hypoxia. Intubated. EXAM: PORTABLE CHEST 1 VIEW COMPARISON:  05/02/2019 FINDINGS: Endotracheal tube in satisfactory position. Nasogastric tube tip and side hole in the proximal stomach. 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 Stable enlarged cardiac silhouette. Improved aeration at the lung bases with minimal residual linear atelectasis at the right lung base. Otherwise, clear lungs. Unremarkable bones. IMPRESSION: 1. Stable cardiomegaly. 2. Improved bibasilar atelectasis. Electronically Signed   By: Beckie SaltsSteven  Reid M.D.   On: 05/03/2019 06:57   Dg Chest Port 1 View  Result Date: 05/02/2019 CLINICAL DATA:  Endotracheal tube. EXAM: PORTABLE CHEST 1 VIEW COMPARISON:  April 22, 2019. FINDINGS: Stable cardiomegaly. Endotracheal tube is unchanged in position. No pneumothorax is noted. Left lung is clear. Mild right basilar atelectasis or infiltrate is noted. Bony thorax is unremarkable.  IMPRESSION: Endotracheal tube in grossly good position. Mild right basilar atelectasis or infiltrate is noted. Electronically Signed   By: Lupita RaiderJames  Green Jr M.D.   On: 05/02/2019 09:08   Dg Abd Portable 1v  Result Date: 05/02/2019 CLINICAL DATA:  Gastric tube placement EXAM: PORTABLE ABDOMEN - 1 VIEW COMPARISON:  05/02/2019 FINDINGS: Gastric tube is been advanced and is now coiled in the stomach with the tip in the fundus of the stomach. Normal bowel gas pattern. Visualized lungs are clear. IMPRESSION: NG tube coiled in the stomach with the tip in the fundus. Electronically Signed   By: Leonette Mostharles  Chestine Spore M.D.   On: 05/02/2019 13:55   Dg Abd Portable 1 View  Result Date: 05/02/2019 CLINICAL DATA:  Orogastric tube placement. EXAM: PORTABLE ABDOMEN - 1 VIEW COMPARISON:  Same day. FINDINGS: Distal tip of nasogastric tube is seen in distal esophagus. It is not significantly changed compared to prior exam. No abnormal bowel dilatation is noted. IMPRESSION: Distal tip of nasogastric tube is seen in distal esophagus and is unchanged compared to prior exam. Advancement is recommended. Electronically Signed   By: Lupita Raider M.D.   On: 05/02/2019 13:53   Dg Abd Portable 1 View  Result Date: 05/02/2019 CLINICAL DATA:  NG tube placement. EXAM: PORTABLE ABDOMEN - 1 VIEW COMPARISON:  None. FINDINGS: The NG tube tip is in the distal esophagus and needs to be advanced several cm. The heart is mildly enlarged. The lung bases are grossly clear. The abdominal bowel gas pattern is unremarkable. IMPRESSION: The NG tube tip is in the distal esophagus and needs to be advanced several cm. Electronically Signed   By: Rudie Meyer M.D.   On: 05/02/2019 10:45   Ct Head Code Stroke Wo Contrast  Result Date: 05/02/2019 CLINICAL DATA:  Code stroke. Right-sided weakness. Last seen normal 11 p.m. EXAM: CT HEAD WITHOUT CONTRAST TECHNIQUE: Contiguous axial images were obtained from the base of the skull through the vertex without  intravenous contrast. COMPARISON:  None. FINDINGS: Brain: There is no mass, hemorrhage or extra-axial collection. There are old bilateral PCA territory infarcts. There is also an old ventrolateral right thalamic infarct. No acute infarct is evident. Vascular: No abnormal hyperdensity of the major intracranial arteries or dural venous sinuses. No intracranial atherosclerosis. Skull: The visualized skull base, calvarium and extracranial soft tissues are normal. Sinuses/Orbits: No fluid levels or advanced mucosal thickening of the visualized paranasal sinuses. No mastoid or middle ear effusion. The orbits are normal. ASPECTS [facility] Stroke Program Early CT Score) - Ganglionic level infarction (caudate, lentiform nuclei, internal capsule, insula, M1-M3 cortex): 7 - Supraganglionic infarction (M4-M6 cortex): 3 Total score (0-10 with 10 being normal): 10 IMPRESSION: 1. No acute intracranial abnormality. 2. 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 *ASPECTS is 10 * These results were communicated to Dr. Georgiana Spinner Aroor at 12:50 am on 05/02/2019 by text page via the [facility] messaging system. Electronically Signed   By: Deatra Robinson M.D.   On: 05/02/2019 00:50    Labs:  CBC: Recent Labs    05/02/19 0033  05/02/19 0500  05/02/19 0736 05/03/19 0359 05/03/19 0435 05/04/19 0509  WBC 6.1  --  6.2  --   --   --  9.4 5.7  HGB 13.8   < > 12.6*   < > 12.6* 11.6* 12.2* 11.0*  HCT 39.5   < > 35.5*   < > 36.2* 34.0* 36.5* 31.7*  PLT 224  --  215  --   --   --  223 163   < > = values in this interval not displayed.    COAGS: Recent Labs    05/02/19 0033 05/02/19 1021 05/03/19 0435  INR 3.4* 2.1* 1.4*  APTT 45*  --   --     BMP: Recent Labs    05/02/19 0033 05/02/19 0040 05/02/19 0500 05/02/19 0518 05/03/19 0359 05/03/19 0435 05/04/19 0509  NA 139 140 134* 138 141 139 141  K 3.6 3.5 3.8 4.8 4.7 4.0 4.0  CL 106 102 107  --   --  109 111  CO2 22  --  20*  --   --  19* 19*  GLUCOSE 96 90 136*   --   --  123* 101*  BUN 11 11 8   --   --  10 12  CALCIUM 9.0  --  7.9*  --   --  8.5* 8.8*  CREATININE 1.23 1.10 0.99  --   --  0.76 0.78  GFRNONAA >60  --  >60  --   --  >60 >60  GFRAA >60  --  >60  --   --  >60 >60    LIVER FUNCTION TESTS: Recent Labs    05/02/19 0033 05/03/19 0435 05/04/19 0509  BILITOT 0.8 1.3* 1.3*  AST 29 19 18   ALT 26 18 15   ALKPHOS 109 89 73  PROT 6.5 5.7* 5.3*  ALBUMIN 3.8 3.3* 2.8*    Assessment and Plan:  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

## 2019-05-05 ENCOUNTER — Encounter (HOSPITAL_COMMUNITY): Payer: Self-pay | Admitting: *Deleted

## 2019-05-05 ENCOUNTER — Inpatient Hospital Stay (HOSPITAL_COMMUNITY): Payer: Medicare Other

## 2019-05-05 DIAGNOSIS — I639 Cerebral infarction, unspecified: Secondary | ICD-10-CM

## 2019-05-05 LAB — CBC
HCT: 32 % — ABNORMAL LOW (ref 39.0–52.0)
Hemoglobin: 10.7 g/dL — ABNORMAL LOW (ref 13.0–17.0)
MCH: 34.7 pg — ABNORMAL HIGH (ref 26.0–34.0)
MCHC: 33.4 g/dL (ref 30.0–36.0)
MCV: 103.9 fL — ABNORMAL HIGH (ref 80.0–100.0)
Platelets: 160 10*3/uL (ref 150–400)
RBC: 3.08 MIL/uL — ABNORMAL LOW (ref 4.22–5.81)
RDW: 13.4 % (ref 11.5–15.5)
WBC: 8.6 10*3/uL (ref 4.0–10.5)
nRBC: 0 % (ref 0.0–0.2)

## 2019-05-05 LAB — PROTIME-INR
INR: 1.2 (ref 0.8–1.2)
Prothrombin Time: 14.8 seconds (ref 11.4–15.2)

## 2019-05-05 LAB — BASIC METABOLIC PANEL
Anion gap: 7 (ref 5–15)
BUN: 19 mg/dL (ref 6–20)
CO2: 22 mmol/L (ref 22–32)
Calcium: 8.6 mg/dL — ABNORMAL LOW (ref 8.9–10.3)
Chloride: 111 mmol/L (ref 98–111)
Creatinine, Ser: 0.81 mg/dL (ref 0.61–1.24)
GFR calc Af Amer: >60 mL/min (ref >60)
GFR calc non Af Amer: >60 mL/min (ref >60)
Glucose, Bld: 82 mg/dL (ref 70–99)
Potassium: 3.7 mmol/L (ref 3.5–5.1)
Sodium: 140 mmol/L (ref 135–145)

## 2019-05-05 MED ORDER — WARFARIN - PHARMACIST DOSING INPATIENT: Freq: Every day | Status: DC

## 2019-05-05 MED ORDER — WARFARIN SODIUM 7.5 MG PO TABS
7.5000 mg | ORAL_TABLET | Freq: Once | ORAL | Status: AC
  Administered 2019-05-05: 18:00:00 7.5 mg via ORAL
  Filled 2019-05-05: qty 1

## 2019-05-05 NOTE — TOC Initial Note (Signed)
Transition of Care (TOC) - Initial/Assessment Note    Patient Details  Name: Logan [name]. MRN: [id number] Date of Birth: August 28, 1975  Transition of Care [facility]) CM/SW Contact:    Gelene Mink, [place] Phone Number: 05/05/2019, 2:08 PM  Clinical Narrative:                  CSW met with the patient at bedside. CSW introduced herself and explained her role. CSW shared therapy recommendation. The patient stated he would be okay with receiving outpatient therapy. He stated he lived with his uncle in [place]. Patient asked if we could find outpatient therapy closer to his address. CSW asked the patient if he had a PCP, he stated he didn't but according to his chart he is seen by Dr. Elijio Miles in internal medicine.   Patient has his uncle drive him to all of his appointments. He can afford his medications. He uses a cane at home to ambulate with. No other DME has been recommended. The patient confirmed that his uncle could provide 24/7 supervision.   CSW will continue to follow and assist with needs.   Expected Discharge Plan: Home/Self Care Barriers to Discharge: Continued Medical Work up   Patient Goals and CMS Choice Patient states their goals for this hospitalization and ongoing recovery are:: Pt wants to feel better   Choice offered to / list presented to : NA  Expected Discharge Plan and Services Expected Discharge Plan: Home/Self Care In-house Referral: Clinical Social Work Discharge Planning Services: Other - See comment(Referral for outpatient therapy) Post Acute Care Choice: NA Living arrangements for the past 2 months: [place]                   DME Agency: NA       HH Arranged: NA HH Agency: NA        Prior Living Arrangements/Services Living arrangements for the past 2 months: [place] Lives with:: Relatives Patient language and need for interpreter reviewed:: No Do you feel safe going back to the place where you live?: Yes      Need for  Family Participation in Patient Care: Yes (Comment) Care giver support system in place?: Yes (comment) Current home services: DME(Has cane that he uses at home) Criminal Activity/Legal Involvement Pertinent to Current Situation/Hospitalization: No - Comment as needed  Activities of Daily Living Home Assistive Devices/Equipment: None ADL Screening (condition at time of admission) Patient's cognitive ability adequate to safely complete daily activities?: Yes Is the patient deaf or have difficulty hearing?: No Does the patient have difficulty seeing, even when wearing glasses/contacts?: No Does the patient have difficulty concentrating, remembering, or making decisions?: No Patient able to express need for assistance with ADLs?: Yes Does the patient have difficulty dressing or bathing?: Yes Independently performs ADLs?: No Communication: Independent Dressing (OT): Independent Grooming: Independent Feeding: Independent Bathing: Needs assistance Is this a change from baseline?: Change from baseline, expected to last <3 days Toileting: Needs assistance Is this a change from baseline?: Change from baseline, expected to last <3 days In/Out Bed: Needs assistance Is this a change from baseline?: Change from baseline, expected to last <3 days Walks in Home: Independent Does the patient have difficulty walking or climbing stairs?: Yes Weakness of Legs: Right Weakness of Arms/Hands: None  Permission Sought/Granted Permission sought to share information with : Case Manager Permission granted to share information with : Yes, Verbal Permission Granted  Emotional Assessment Appearance:: Appears stated age Attitude/Demeanor/Rapport: Engaged Affect (typically observed): Calm Orientation: : Oriented to Self, Oriented to Place, Oriented to  Time, Oriented to Situation Alcohol / Substance Use: Not Applicable Psych Involvement: No (comment)  Admission diagnosis:  Stroke [facility])  [I63.9] Stroke (cerebrum) [facility]) [I63.9] Patient Active Problem List   Diagnosis Date Noted  . Cerebrovascular accident (CVA) ([place]) 05/02/2019  . Middle cerebral artery embolism, left 05/02/2019  . Ischemic cardiomyopathy 05/02/2019  . Postprocedural hypotension 05/02/2019  . Anticoagulant long-term use 05/02/2019  . Subarachnoid hemorrhage ([place]) 05/02/2019  . Elevated troponin 05/02/2019  . Acute respiratory failure with hypoxia ([place])    PCP:  Jodi Marble, MD Pharmacy:   [facility] [place], [place] - [place] AT [facility] [address] [place] [address] Phone: [phone] Fax: [phone]  [facility] [phone] - [place], [place] - [place] AT [address] [place] [address] Phone: [phone] Fax: [phone]     Social Determinants of Health (SDOH) Interventions    Readmission Risk Interventions Readmission Risk Prevention Plan 05/05/2019  Transportation Screening Complete  PCP or Specialist Appt within 5-7 Days Complete  Home Care Screening Not Complete  Home Care Screening Not Completed Comments NA  Medication Review (RN CM) Complete

## 2019-05-05 NOTE — Progress Notes (Signed)
ANTICOAGULATION CONSULT NOTE - Follow Up Consult  Pharmacy Consult for warfarin Indication: cardiomyopathy with multiple strokes and hx of LV thrombus in 2014, failed DOAC  No Known Allergies  Patient Measurements: Height: 5\' 8"  (172.7 cm) Weight: 144 lb 10 oz (65.6 kg) IBW/kg (Calculated) : 68.4   Vital Signs: Temp: 97.9 F (36.6 C) (11/01 1217) Temp Source: Oral (11/01 1217) BP: 131/81 (11/01 1217) Pulse Rate: 93 (11/01 1217)  Labs: Recent Labs    05/03/19 0435 05/04/19 0509 05/05/19 0404  HGB 12.2* 11.0* 10.7*  HCT 36.5* 31.7* 32.0*  PLT 223 163 160  LABPROT 16.7*  --   --   INR 1.4*  --   --   CREATININE 0.76 0.78 0.81    Estimated Creatinine Clearance: 109.1 mL/min (by C-G formula based on SCr of 0.81 mg/dL).   Assessment: Pharmacy has been consulted to initiate warfarin in a patient with history of multiple strokes, LV thrombus and afib who has failed DOAC therapy. Given his age, weight and indication I will initiate him on 7.5mg  tonight. I will order an INR to be drawn before his dose so we can know his baseline and we will monitor and INR and CBC daily going forward.   Goal of Therapy:  INR 2-3 Monitor platelets by anticoagulation protocol: Yes   Plan:  Warfarin 7.5mg  x1  Daily INR and CBC Continue ASA until warfarin therapeutic then can stop  Phillis Haggis 05/05/2019,2:06 PM

## 2019-05-05 NOTE — Evaluation (Signed)
Occupational Therapy Evaluation Patient Details Name: Logan [name]. MRN: [id number] DOB: 04/15/1976 Today's Date: 05/05/2019    History of Present Illness 43 yo admitted with aphasia and Rt weakness with Left MCA occlusion s/p thrombectomy. PMhx: Afib, CAD, CVA, heart failure, ICD   Clinical Impression   Pt admitted with the above diagnosis.  He presents to OT with communication deficits, and ? Higher level cognitive deficits - difficult to determine secondary to communication deficits and pt will likely need to be assessed while performing functional tasks.   He is able to perform ADLs mod I.  He reports he manages own medications, but aunt and uncle assist with the remainder of IADLs.  Recommend that he have direct supervision for medication management, and recommend OPPT for further assessment of higher level skills and IADLs.  All further OT needs can be addressed by OPOT, acute OT will sign off.     Follow Up Recommendations  Outpatient OT;Supervision - Intermittent    Equipment Recommendations  None recommended by OT    Recommendations for Other Services       Precautions / Restrictions Precautions Precautions: None      Mobility Bed Mobility Overal bed mobility: Independent                Transfers Overall transfer level: Modified independent                    Balance Overall balance assessment: Mild deficits observed, not formally tested                                         ADL either performed or assessed with clinical judgement   ADL Overall ADL's : Modified independent                                       General ADL Comments: Pt simulated shower in standing with no LOB      Vision Patient Visual Report: No change from baseline Vision Assessment?: Yes Eye Alignment: Within Functional Limits Ocular Range of Motion: Within Functional Limits Alignment/Gaze Preference: Within Defined  Limits Tracking/Visual Pursuits: Able to track stimulus in all quads without difficulty Visual Fields: No apparent deficits Additional Comments: Pt reports he needs glasses but doesn't have any.  When OT attempted to delve into why he doesn't have any in order to provide resources if appropriate, pt became irritable "repeatedly stating "I don't have them", "because I don't have any".  attempted to glean info several different ways without success.  Pt instructed that is he needs resources OT can provide community resources.      Perception Perception Perception Tested?: Yes   Praxis Praxis Praxis tested?: Within functional limits    Pertinent Vitals/Pain Pain Assessment: No/denies pain     Hand Dominance Right   Extremity/Trunk Assessment Upper Extremity Assessment Upper Extremity Assessment: Overall WFL for tasks assessed   Lower Extremity Assessment Lower Extremity Assessment: Overall WFL for tasks assessed   Cervical / Trunk Assessment Cervical / Trunk Assessment: Normal   Communication Communication Communication: Expressive difficulties   Cognition Arousal/Alertness: Awake/alert Behavior During Therapy: WFL for tasks assessed/performed Overall Cognitive Status: Difficult to assess  General Comments: cognition seem intact for basic info, but language deficits make full assessment difficult    General Comments       Exercises     Shoulder Instructions      Home Living Family/patient expects to be discharged to:: Private residence Living Arrangements: Other relatives Available Help at Discharge: Family;Available 24 hours/day Type of Home: House Home Access: Stairs to enter [employer] of Steps: 5 Entrance Stairs-Rails: Right Home Layout: One level     Bathroom Shower/Tub: Occupational psychologist: Standard     Home Equipment: Cane - single point;Grab bars - tub/shower          Prior  Functioning/Environment Level of Independence: Independent with assistive device(s)        Comments: Pt reports he lives with aunt and uncle, does not drive or work.  He sets up his own pill box, and aunt assists him with financial management.  He reports that he uses [facility] "when I need it", and reports aunt/uncle supervise him on stairs (not family present during eval to verify info         OT Problem List: Decreased cognition      OT Treatment/Interventions:      OT Goals(Current goals can be found in the care plan section) Acute Rehab OT Goals Patient Stated Goal: Pt did not state  OT Goal Formulation: All assessment and education complete, DC therapy  OT Frequency:     Barriers to D/C:            Co-evaluation              AM-PAC OT "6 Clicks" Daily Activity     Outcome Measure Help from another person eating meals?: None Help from another person taking care of personal grooming?: None Help from another person toileting, which includes using toliet, bedpan, or urinal?: None Help from another person bathing (including washing, rinsing, drying)?: None Help from another person to put on and taking off regular upper body clothing?: None Help from another person to put on and taking off regular lower body clothing?: None 6 Click Score: 24   End of Session Nurse Communication: Mobility status  Activity Tolerance: Patient tolerated treatment well Patient left: Other (comment)(with transport tech to go to CT )  OT Visit Diagnosis: Cognitive communication deficit (R41.841) Symptoms and signs involving cognitive functions: Cerebral infarction                Time: 1120-1140 OT Time Calculation (min): 20 min Charges:  OT General Charges $OT Visit: 1 Visit OT Evaluation $OT Eval Moderate Complexity: 1 Mod  Lucille Passy, OTR/L Acute Rehabilitation Services Pager [phone] Office [phone]   Lucille Passy M 05/05/2019, 12:00 PM

## 2019-05-05 NOTE — Progress Notes (Addendum)
STROKE TEAM PROGRESS NOTE   INTERVAL HISTORY Pt lying in bed, aphasia continues to improve. More fluent and less paraphasic errors. Repeat CT showed continued evolution of left SAH. Will re-start coumadin with ASA bridge. BP still soft. Cardiology curbside consult recommend no BP meds at this time.   Vitals:   05/05/19 0014 05/05/19 0458 05/05/19 0722 05/05/19 0821  BP: 97/79 (!) 115/94 104/74   Pulse: 69 80 73   Resp: 16 16 18    Temp: 97.9 F (36.6 C) 97.6 F (36.4 C) 98.4 F (36.9 C)   TempSrc: Oral Oral Oral   SpO2: 97% 99% 99% 96%  Weight:      Height:        CBC:  Recent Labs  Lab 05/02/19 0033  05/02/19 0500  05/04/19 0509 05/05/19 0404  WBC 6.1  --  6.2   < > 5.7 8.6  NEUTROABS 3.2  --  4.6  --   --   --   HGB 13.8   < > 12.6*   < > 11.0* 10.7*  HCT 39.5   < > 35.5*   < > 31.7* 32.0*  MCV 100.0  --  100.0   < > 101.3* 103.9*  PLT 224  --  215   < > 163 160   < > = values in this interval not displayed.    Basic Metabolic Panel:  Recent Labs  Lab 05/03/19 0435 05/04/19 0509 05/05/19 0404  NA 139 141 140  K 4.0 4.0 3.7  CL 109 111 111  CO2 19* 19* 22  GLUCOSE 123* 101* 82  BUN 10 12 19   CREATININE 0.76 0.78 0.81  CALCIUM 8.5* 8.8* 8.6*  MG 1.9 1.9  --   PHOS 3.7 3.6  --    Lipid Panel:     Component Value Date/Time   CHOL 122 05/02/2019 0500   TRIG 34 05/02/2019 0500   HDL 31 (L) 05/02/2019 0500   CHOLHDL 3.9 05/02/2019 0500   VLDL 7 05/02/2019 0500   LDLCALC 84 05/02/2019 0500   HgbA1c:  Lab Results  Component Value Date   HGBA1C 5.4 05/02/2019   Urine Drug Screen:     Component Value Date/Time   LABOPIA NONE DETECTED 05/02/2019 0756   COCAINSCRNUR NONE DETECTED 05/02/2019 0756   LABBENZ NONE DETECTED 05/02/2019 0756   AMPHETMU NONE DETECTED 05/02/2019 0756   THCU NONE DETECTED 05/02/2019 0756   LABBARB NONE DETECTED 05/02/2019 0756    Alcohol Level No results found for: ETH  IMAGING  Ct Head Wo Contrast 05/03/2019 IMPRESSION:   Diminishing density of the left sylvian fissure hemorrhage. Mild swelling and loss of gray-white differentiation in the left posterior temporal and parietal region consistent with infarction. No mass effect.  Old right cerebellar and right occipital infarctions appear the same. Late subacute to old left occipital infarction appears the same.   Mr Brain Wo Contrast 05/04/2019 IMPRESSION:  1. Acute/subacute nonhemorrhagic infarct involving the left temporal lobe and posterior insular cortex.  2. Additional scattered punctate foci of acute/subacute nonhemorrhagic infarct are present in the left frontal lobe and more superiorly within the left parietal lobe.  3. Resolving subarachnoid hemorrhage in the left Sylvian fissure.  4. Remote infarcts involving the medial inferior occipital lobes, right greater than left.  5. Remote infarct of the right superior cerebellum.  6. Extensive sinus disease.   Dg Chest Port 1 View 05/04/2019 IMPRESSION:  1. Interval endotracheal and esophagogastric extubation.  2. Otherwise no significant change in  AP portable examination cardiomegaly without acute abnormality of the lungs.   Cerebral Angio 05/02/2019 S/P LT common carotid arteriogram followed by partial recanalization of occluded Lt MCA distal M2 inf division with x 1 pass wit solitairex 38mm x 40 mm X retrieval device and penumbra aspiration Achieving a TICI 2c revascularization.  2D Echocardiogram  1. No LV thrombus seen. There is akinesis of the septum into the apex concerning for prior infarction. Abnormal septal motion consistent with LBBB. Severely reduced LVEF, EF 15-20%. LVOT VTI 10.6 cm with estimated cardiac output 1.7 L/min (1.0  L/min/m2).  2. Left ventricular ejection fraction, by visual estimation, is <20%. The left ventricle has severely decreased function. There is no left ventricular hypertrophy.  3. Definity contrast agent was given IV to delineate the left ventricular endocardial  borders.  4. Abnormal septal motion consistent with left bundle branch block.  5. Elevated left atrial pressure.  6. Left ventricular diastolic parameters are consistent with Grade III diastolic dysfunction (restrictive).  7. Severely dilated left ventricular internal cavity size.  8. The left ventricle demonstrates global hypokinesis.  9. Global right ventricle has moderately reduced systolic function.The right ventricular size is normal. No increase in right ventricular wall thickness. 10. Left atrial size was moderately dilated. 11. Mild mitral annular calcification. 12. The mitral valve is grossly normal. Moderate mitral valve regurgitation. 13. Severe restricted movement of PMVL with at least moderate (maybe severe MR, but poorly quantitated). 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 14. The tricuspid valve is grossly normal. Tricuspid valve regurgitation is trivial. 15. The aortic valve is tricuspid. Aortic valve regurgitation is not visualized. No evidence of aortic valve sclerosis or stenosis. 16. The pulmonic valve was grossly normal. Pulmonic valve regurgitation is not visualized. 17. The inferior vena cava IVC unable to be assessed due to ventilator therapy. 18. Right atrial size was normal.   PHYSICAL EXAM   Temp:  [97.2 F (36.2 C)-98.4 F (36.9 C)] 98.4 F (36.9 C) (11/01 0722) Pulse Rate:  [69-115] 73 (11/01 0722) Resp:  [15-31] 18 (11/01 0722) BP: (90-115)/(32-94) 104/74 (11/01 0722) SpO2:  [93 %-100 %] 96 % (11/01 0821)  General - Well nourished, well developed, in no apparent distress.  Ophthalmologic - fundi not visualized due to noncooperation.  Cardiovascular - Regular rhythm and rate.  Mental Status -  Level of arousal and orientation to year, date, place, and age were intact, however, answered month wrong. Following all simple commands, mild hesitation intermittently, naming 4/5, difficulty with repeating complex sentences  Cranial Nerves II - XII - II -  Visual field intact OU. III, IV, VI - Extraocular movements intact. V - Facial sensation intact bilaterally. VII - Facial movement intact bilaterally. VIII - Hearing & vestibular intact bilaterally. X - Palate elevates symmetrically. XI - Chin turning & shoulder shrug intact bilaterally. XII - Tongue protrusion intact.  Motor Strength - The patient's strength was normal in all extremities and pronator drift was absent.  Bulk was normal and fasciculations were absent.   Motor Tone - Muscle tone was assessed at the neck and appendages and was normal.  Reflexes - The patient's reflexes were symmetrical in all extremities and he had no pathological reflexes.  Sensory - Light touch, temperature/pinprick were assessed and were symmetrical.    Coordination - The patient had normal movements in the hands with no ataxia or dysmetria.  Tremor was absent.  Gait and Station - deferred.   ASSESSMENT/PLAN [name]. is a 43 y.o. male with history  of AF and LV thrombus on AC, CHF, cardiomyopathy , prior stroke presenting nonverbal w/ R sided weakness. No tPA as on xarelto. To IR for occluded L M2 thrombectomy.  Stroke: R M2 infarct s/p IR w/ resultant SAH, embolic secondary to known AF on AC, hx LV clot  CT head No acute abnormality. Old B PCA and R thalamic infarcts. ASPECTS 10.     CTA head & neck occlusion L MCA inferior M2 division.  CT perfusion 67 mL penumbnra  Cerebral angio TICI2c recanalization of occluded L M2  Post IR CT mild to moderate L perisylvian SAH  Repeat CT head unchanged SAH, old posterior circulation infarcts   MRI - Acute/subacute nonhemorrhagic infarct involving the left temporal lobe and posterior insular cortex. Additional scattered punctate foci of acute/subacute nonhemorrhagic infarct are present in the left frontal lobe and more superiorly within the left parietal lobe. Remote infarcts noted.  CT head 11/1 - left SAH improving   2D Echo EF 15-20%.  No LV thrombus, no source of embolus   LDL 84  HgbA1c 5.4  SCDs for VTE prophylaxis  Xarelto (rivaroxaban) daily prior to admission, now on ASA 81mg . Given SAH continues to improve, will start coumadin today with ASA bridge.    Therapy recommendations: none  Disposition:  pending   Atrial Fibrillation H/o LV thrombus  History of ischemic cardiomyopathy and left ventricular mural thrombus in 2014   Home anticoagulation:  Xarelto (rivaroxaban) daily   pt aunt whom he lives with confirmed that pt compliant with his medication at home  [facility]  Now on ASA 81  start coumadin due to failure of Xarelto several times. Continue ASA bridge  CHF and cardiomyopathy  01/2016 EF 25%  On this admission EF 15 to 20%  Home medications including Lasix, losartan, spironolactone  [facility]  CXR unremarkable  BP soft  Curbside consult cardiology Dr. Melburn PopperNasher, felt EF stable, continue to follow up with cardiology as outpt  History of stroke  01/2016 - Probable R brain TIA, CTA head and neck negative.  EF 25%.  LDL 170.  Put on aspirin 325 and Xarelto  03/2017 -admitted for left-sided weakness and neglect.  CT showed right superior cerebellar infarct and right P2 hyperdense.  CT head and neck right P2 occlusion status post IR with TICI3 reperfusion.  MRI showed right SCA and right PCA infarct with small left PCA and right PICA and right thalamic infarcts.  LDL 113 and A1c 5.1.  Patient continued on aspirin and Xarelto  10/2017 - likely TIA, LDL 149 and continued on Xarelto  12/2017 - likely TIA. on xarelto, continued.  Hypotension due to cardiomyopathy Hx Hypertension  Home meds:  Lasix 20, losartan 25 bid, spironolactone 25  Off neo  BP goal < 160  Currently BP on the low end likely due to CHF  Long-term BP goal normotensive  Cardiology curbside Dr. Melburn PopperNasher - recommend not to resume CHF meds yet due to soft BP, pt will  close follow up with CHF clinic to resume CHF meds if needed.  Hyperlipidemia  Home meds:  lipitor 80  LDL 84, goal < 70  Resumed lipitor 80  Continue statin at discharge  Acute blood loss Anemia   R groin bleeding from sheath site, Also post IR  Hb 12.6-11.6-12.2->11.0->10.7    Check B12 and Folate pending  Close monitoring  Tobacco abuse  Current smoker  Smoking cessation counseling provided  Pt is willing to quit  Other Stroke Risk Factors  ETOH use  Hx Substance abuse marijuana  Coronary artery disease s/p DES LAD 2013, reinfarct w/ in-stent thrombus, s/p CABG 3v 2014  Other Active Problems  Severe mitral regurg, mild TR  Lives with aunt Logan Dandy  Pt lives with aunt, and as per aunt, pt compliant with medication. Family familiar with coumadin  Hospital day # 3  Marvel Plan, MD PhD Stroke Neurology 05/05/2019 3:57 PM    To contact Stroke Continuity provider, please refer to [email or web address]. After hours, contact General Neurology

## 2019-05-05 NOTE — Discharge Summary (Addendum)
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  Past Medical History:  Diagnosis Date  . Anemia   . Atrial fibrillation ([place])   . Stroke [facility])    Past Surgical History:  Procedure Laterality Date  . Cardiac Defibrillator [facility] Left   . RADIOLOGY WITH ANESTHESIA N/A 05/02/2019   Procedure: IR WITH ANESTHESIA;  Surgeon: Luanne Bras, MD;  Location: [place];  Service: Radiology;  Laterality: N/A;   Family History Family History  Problem Relation Age of Onset  . COPD Mother   . Cardiomyopathy Mother   . Arthritis Mother   . Heart attack Father    Social History  reports that he has been smoking cigarettes. He has been smoking about 0.50 packs per day. He has never used smokeless tobacco. He reports current alcohol use of about 3.0 standard drinks of alcohol per week. He reports that he does not use drugs.  Allergies as of 05/06/2019   No Known Allergies     Medication List    STOP taking these medications   furosemide 20 MG tablet Commonly known as: LASIX   losartan 25 MG  tablet Commonly known as: COZAAR   spironolactone 25 MG tablet Commonly known as: ALDACTONE   Xarelto 20 MG Tabs tablet Generic drug: rivaroxaban     TAKE these medications   aspirin 81 MG EC tablet Take 1 tablet (81 mg total) by mouth daily. Stop aspirin once INR therapeutic Start taking on: May 07, 2019   atorvastatin 80 MG tablet Commonly known as: LIPITOR Take 80 mg by mouth at bedtime.   cyanocobalamin 1000 MCG tablet Take 1 tablet (1,000 mcg total) by mouth daily. Start taking on: May 07, 2019   warfarin 5 MG tablet Commonly known as: Coumadin Take 1.5 tablets (7.5 mg total) by mouth daily. Take 7.5 md aily at 6pm until blood work is drawn. Adjust dose based on coumadin clinic direction.       HOME MEDICATIONS PRIOR TO ADMISSION Medications Prior to Admission  Medication Sig Dispense Refill  . atorvastatin (LIPITOR) 80 MG tablet Take 80 mg by mouth at bedtime.    . furosemide (LASIX) 20 MG tablet Take 80 mg by mouth daily.     Marland Kitchen losartan (COZAAR) 25 MG tablet Take 25 mg by mouth daily.    Marland Kitchen spironolactone (ALDACTONE) 25 MG tablet Take 25 mg by mouth daily.    Alveda Reasons 20 MG TABS tablet Take 20 mg by mouth daily.     HOSPITAL MEDICATIONS .  stroke: mapping our early stages of recovery book   Does not apply Once  .  aspirin EC  81 mg Oral Daily  . atorvastatin  80 mg Oral QHS  . budesonide (PULMICORT) nebulizer solution  0.25 mg Nebulization BID  . Chlorhexidine Gluconate Cloth  6 each Topical Daily  . mouth rinse  15 mL Mouth Rinse BID  . pantoprazole  40 mg Oral Daily  . [START ON 05/07/2019] vitamin B-12  1,000 mcg Oral Daily  . Warfarin - Pharmacist Dosing Inpatient   Does not apply q1800    LABORATORY STUDIES CBC    Component Value Date/Time   WBC 7.1 05/06/2019 0337   RBC 3.13 (L) 05/06/2019 0337   HGB 10.8 (L) 05/06/2019 0337   HCT 31.3 (L) 05/06/2019 0337   PLT 169 05/06/2019 0337   MCV 100.0 05/06/2019 0337   MCH 34.5 (H) 05/06/2019  0337   MCHC 34.5 05/06/2019 0337   RDW 13.0 05/06/2019 0337   LYMPHSABS 1.1 05/02/2019 0500   MONOABS 0.4 05/02/2019 0500   EOSABS 0.0 05/02/2019 0500   BASOSABS 0.0 05/02/2019 0500   CMP    Component Value Date/Time   NA 138 05/06/2019 0337   K 3.8 05/06/2019 0337   CL 111 05/06/2019 0337   CO2 20 (L) 05/06/2019 0337   GLUCOSE 75 05/06/2019 0337   BUN 14 05/06/2019 0337   CREATININE 0.77 05/06/2019 0337   CALCIUM 8.6 (L) 05/06/2019 0337   PROT 5.3 (L) 05/04/2019 0509   ALBUMIN 2.8 (L) 05/04/2019 0509   AST 18 05/04/2019 0509   ALT 15 05/04/2019 0509   ALKPHOS 73 05/04/2019 0509   BILITOT 1.3 (H) 05/04/2019 0509   GFRNONAA >60 05/06/2019 0337   GFRAA >60 05/06/2019 0337   COAGS Lab Results  Component Value Date   INR 1.2 05/06/2019   INR 1.2 05/05/2019   INR 1.4 (H) 05/03/2019   Lipid Panel    Component Value Date/Time   CHOL 122 05/02/2019 0500   TRIG 34 05/02/2019 0500   HDL 31 (L) 05/02/2019 0500   CHOLHDL 3.9 05/02/2019 0500   VLDL 7 05/02/2019 0500   LDLCALC 84 05/02/2019 0500   HgbA1C  Lab Results  Component Value Date   HGBA1C 5.4 05/02/2019   Urine Drug Screen     Component Value Date/Time   LABOPIA NONE DETECTED 05/02/2019 0756   COCAINSCRNUR NONE DETECTED 05/02/2019 0756   LABBENZ NONE DETECTED 05/02/2019 0756   AMPHETMU NONE DETECTED 05/02/2019 0756   THCU NONE DETECTED 05/02/2019 0756   LABBARB NONE DETECTED 05/02/2019 0756     SIGNIFICANT DIAGNOSTIC STUDIES  Ct Head Code Stroke Wo Contrast 05/02/2019 IMPRESSION:  1. No acute intracranial abnormality. 2. 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 *ASPECTS is 10 *   Ct Head Wo Contrast 05/02/2019 IMPRESSION:  1. Unchanged subarachnoid blood and/or contrast in the left sylvian fissure when compared to intraoperative CT. 2. No detectable MCA infarct. 3. Remote posterior circulation infarcts.   Ct Angio Head W Or Wo Contrast Ct Angio Neck W Or Wo Contrast Ct Cerebral Perfusion W  Contrast 05/02/2019 IMPRESSION:  1. Occlusion of the left middle cerebral artery inferior division mid M2 segment. 2. 67 mL area of ischemic penumbra demonstrated by CT perfusion analysis.    Cerebral Angio 05/02/2019  S/P LT common carotid arteriogram followed by partial recanalization of occluded Lt MCA distal M2 inf division with x 1 pass wit solitairex 4mm x 40 mm X retrieval device and penumbra aspiration Achieving a TICI 2c revascularization.  Ct Head Wo Contrast 05/03/2019 IMPRESSION:  Diminishing density of  the left sylvian fissure hemorrhage. Mild swelling and loss of gray-white differentiation in the left posterior temporal and parietal region consistent with infarction. No mass effect.  Old right cerebellar and right occipital infarctions appear the same. Late subacute to old left occipital infarction appears the same.   Mr Brain Wo Contrast 05/04/2019 IMPRESSION:  1. Acute/subacute nonhemorrhagic infarct involving the left temporal lobe and posterior insular cortex.  2. Additional scattered punctate foci of acute/subacute nonhemorrhagic infarct are present in the left frontal lobe and more superiorly within the left parietal lobe.  3. Resolving subarachnoid hemorrhage in the left Sylvian fissure.  4. Remote infarcts involving the medial inferior occipital lobes, right greater than left.  5. Remote infarct of the right superior cerebellum.  6. Extensive sinus disease.   Ct Head Wo Contrast 05/05/2019 IMPRESSION:  1. Expected evolution of left temporal and parietal acute infarcts. 2. Continued improvement of left Sylvian fissure subarachnoid hemorrhage. 3. No new hemorrhage. 4. Expected evolution of late subacute infarct in the medial left occipital lobe. 5. Stable remote infarcts of the medial right occipital lobe and superior right cerebellum. 6. Stable remote lacunar infarct of the right thalamus.   Dg Chest Port 1 View 05/04/2019 1. Interval endotracheal and  esophagogastric extubation.  2. Otherwise no significant change in AP portable examination cardiomegaly without acute abnormality of the lungs.  05/03/2019 1. Stable cardiomegaly. 2. Improved bibasilar atelectasis.  05/02/2019 Endotracheal tube in grossly good position. Mild right basilar atelectasis or infiltrate is noted.   2D Echocardiogram 1. No LV thrombus seen. There is akinesis of the septum into the apex concerning for prior infarction. Abnormal septal motion consistent with LBBB. Severely reduced LVEF, EF 15-20%. LVOT VTI 10.6 cm with estimated cardiac output 1.7 L/min (1.0  L/min/m2). 2. Left ventricular ejection fraction, by visual estimation, is <20%. The left ventricle has severely decreased function. There is no left ventricular hypertrophy. 3. Definity contrast agent was given IV to delineate the left ventricular endocardial borders. 4. Abnormal septal motion consistent with left bundle branch block. 5. Elevated left atrial pressure. 6. Left ventricular diastolic parameters are consistent with Grade III diastolic dysfunction (restrictive). 7. Severely dilated left ventricular internal cavity size. 8. The left ventricle demonstrates global hypokinesis. 9. Global right ventricle has moderately reduced systolic function.The right ventricular size is normal. No increase in right ventricular wall thickness. 10. Left atrial size was moderately dilated. 11. Mild mitral annular calcification. 12. The mitral valve is grossly normal. Moderate mitral valve regurgitation. 13. Severe restricted movement of PMVL with at least moderate (maybe severe MR, but poorly quantitated). 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 14. The tricuspid valve is grossly normal. Tricuspid valve regurgitation is trivial. 15. The aortic valve is tricuspid. Aortic valve regurgitation is not visualized. No evidence of aortic valve sclerosis or stenosis. 16. The pulmonic valve was grossly normal. Pulmonic valve  regurgitation is not visualized. 17. The inferior vena cava IVC unable to be assessed due to ventilator therapy. 18. Right atrial size was normal.    HISTORY OF PRESENT ILLNESS  (From H&P 05/02/19 Dr Laurence SlateAroor) Danella PentonJack Stella Jr. is a 43 y.o. 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 Last normal was 11 PM, witnessed by his cousin.  His cousin later found him slumped over next to the door with his  right arm flaccid and not speaking.  EMS arrived at the scene and patient noted to be aphasic and plegic on the right side.  In route his right arm strength improved however patient continued to remain aphasic on arrival. BP was 104/75 mmHg . A stat CT head was obtained which showed no hemorrhage, redemonstrated prior bilateral PCA infarcts.  tPA was not administered as patient is on Xarelto.  INR also elevated at 3.4.  CT angiogram showed a left M2 occlusion and CT perfusion showed a 67 cc penumbra and patient was taken to IR for mechanical thrombectomy. Date last known well: 05/01/2019 Time last known well: 11 PM tPA Given: No patient on anticoagulation NIHSS: 9 Baseline MRS 1  HOSPITAL COURSE Mr. Davelle Beitzel. is a 43 y.o. male with history of Atrial Fibrillation, LV thrombus on anticoagulation, cardiomyopathy / CHF (Dr Gala Romney), implantable cardiac defibrillator, hx of medical non compliance, and prior stroke presenting nonverbal w/ R sided weakness. No tPA as on xarelto. To IR for occluded L M2 thrombectomy.  Stroke: R M2 infarct s/p IR w/ resultant SAH, embolic secondary to known AF on AC, hx LV clot  CT head No acute abnormality. Old B PCA and R thalamic infarcts. ASPECTS 10.     CTA head & neck occlusion L MCA inferior M2 division.  CT perfusion 67 mL  penumbnra  Cerebral angio TICI2c recanalization of occluded L M2  Post IR CT mild to moderate L perisylvian SAH  Repeat CT head unchanged SAH, old posterior circulation infarcts   MRI - Acute/subacute nonhemorrhagic infarct involving the left temporal lobe and posterior insular cortex. Additional scattered punctate foci of acute/subacute nonhemorrhagic infarct are present in the left frontal lobe and more superiorly within the left parietal lobe. Remote infarcts noted.  CT head 11/1 - left SAH improving   2D Echo EF 15-20%. No LV thrombus, no source of embolus.  LDL 84  HgbA1c 5.4  Xarelto (rivaroxaban) daily prior to admission, Held anticoagulation as IP due to [facility] post procedure.  now on ASA 81mg  bridge to  Warfarin. Stop aspirin once INR therapeutic.   Therapy recommendations: no f/u PT , OP OT, OP SLP -> arranged at [facility]    Disposition:  return home  Atrial Fibrillation H/o LV thrombus  History of ischemic cardiomyopathy and left ventricular mural thrombus in 2014   Home anticoagulation:  Xarelto (rivaroxaban) daily   pt aunt whom he lives with confirmed that pt compliant with his medication at home  Off [facility] at this time d/t [facility]  Now on ASA 81  Changed xarelto to coumadin due to failure of DOAC several times. Continue aspirin bridge until coumadin therapeutic  CHF and cardiomyopathy (Dr Shirlee Latch / Dr Gala Romney)  01/2016 EF 25%  On this admission EF 15 to 20%  Home medications including Lasix, losartan, spironolactone  Has been following with [facility] clinic [facility]   CXR unremarkable  BP soft  Curbside consult cardiology Dr. Melburn Popper, felt EF stable, continue to hold meds and follow up with cardiology as outpt  History of stroke ? 01/2016 - Probable R brain TIA, CTA head and neck negative.  EF 25%.  LDL 170.  Put on aspirin 325 and Xarelto ? 03/2017 -admitted for left-sided weakness and neglect.  CT showed right superior cerebellar infarct and right P2  hyperdense.  CT head and neck right P2 occlusion status post IR with TICI3 reperfusion.  MRI showed right SCA and right PCA infarct with small left PCA  and right PICA and right thalamic infarcts.  LDL 113 and A1c 5.1.  Patient continued on aspirin and Xarelto ? 10/2017 - likely TIA, LDL 149 and continued on Xarelto ? 12/2017 - likely TIA. on xarelto, continued.  Hypotension due to cardiomyopathy Hx Hypertension  Home meds:  Lasix 20, losartan 25 bid, spironolactone 25  Off neo  BP goal < 160  Currently BP on the low end likely due to CHF  Long-term BP goal normotensive  Cardiology curbside Dr. Melburn Popper - recommend not to resume CHF meds yet due to soft BP, pt will close follow up with CHF clinic to resume CHF meds if needed.  Hyperlipidemia  Home meds:  lipitor 80  LDL 84, goal < 70  Resumed lipitor 80  Continue statin at discharge  Acute blood loss Anemia   R groin bleeding from sheath site, Also post IR  Hb 12.6-11.6-12.2->11.0->10.7->10.8   Check B12 low normal. Will replace    Folate normal    Tobacco abuse  Current smoker  Smoking cessation counseling provided  Pt is willing to quit  Other Stroke Risk Factors  ETOH use  Hx Substance abuse marijuana  Coronary artery disease s/p DES LAD 2013, reinfarct w/ in-stent thrombus, s/p CABG 3v 2014  Other Active Problems  Severe mitral regurg, mild TR  Lives with aunt Corrie Dandy  As per aunt, pt compliant with medication. Family familiar with coumadin  DISCHARGE EXAM Vitals:   05/06/19 0415 05/06/19 0806 05/06/19 0846 05/06/19 1144  BP: 102/75 105/86  128/67  Pulse: 70 74 74 71  Resp: Temp: 97.9 F (36.6 C) (!) 97.5 F (36.4 C)  97.7 F (36.5 C)  TempSrc:  Oral  Oral  SpO2: 98% 97%  100%  Weight:      Height:       General - Well nourished, well developed, in no apparent distress.  Ophthalmologic - fundi not visualized due to noncooperation.  Cardiovascular - Regular rhythm and  rate.  Mental Status -  Level of arousal and orientation to year, date, place, and age were intact, however, answered month wrong. Following all simple commands, mild hesitation intermittently, naming 4/5, difficulty with repeating complex sentences  Cranial Nerves II - XII - II - Visual field intact OU. III, IV, VI - Extraocular movements intact. V - Facial sensation intact bilaterally. VII - Facial movement intact bilaterally. VIII - Hearing & vestibular intact bilaterally. X - Palate elevates symmetrically. XI - Chin turning & shoulder shrug intact bilaterally. XII - Tongue protrusion intact.  Motor Strength - The patient's strength was normal in all extremities and pronator drift was absent.  Bulk was normal and fasciculations were absent.   Motor Tone - Muscle tone was assessed at the neck and appendages and was normal.  Reflexes - The patient's reflexes were symmetrical in all extremities and he had no pathological reflexes.  Sensory - Light touch, temperature/pinprick were assessed and were symmetrical.    Coordination - The patient had normal movements in the hands with no ataxia or dysmetria.  Tremor was absent.  Gait and Station - deferred.  Discharge Diet   Heart healthy thin liquids  DISCHARGE PLAN  Disposition:  Home with Fanny Bien as prior to admission   aspirin 81 mg daily as bridge to warfarin, INR goal 2-3, stop aspirin once INR therapeutic  Follow-up at [facility] in [place] coumadin clinic on Wed 05/08/2019 with Misty Stanley. They will call him to arrange appointment.  Outpatient PT and SLP follow up at [facility]   Ongoing risk factor control by Primary Care Physician at time of discharge  Follow-up [name], MD in 2 weeks.  Follow-up in [facility] in 4 weeks, office to schedule an appointment.   Follow-up Dr Gala Romney, CHF clinic in 1 week  40 minutes were spent preparing discharge.  Marvel Plan,  MD PhD Stroke Neurology 05/06/2019 5:31 PM

## 2019-05-06 ENCOUNTER — Other Ambulatory Visit (HOSPITAL_COMMUNITY): Payer: Medicare Other

## 2019-05-06 ENCOUNTER — Other Ambulatory Visit (HOSPITAL_COMMUNITY): Payer: Self-pay

## 2019-05-06 DIAGNOSIS — I5022 Chronic systolic (congestive) heart failure: Secondary | ICD-10-CM

## 2019-05-06 LAB — PROTIME-INR
INR: 1.2 (ref 0.8–1.2)
Prothrombin Time: 15.3 seconds — ABNORMAL HIGH (ref 11.4–15.2)

## 2019-05-06 LAB — BASIC METABOLIC PANEL
Anion gap: 7 (ref 5–15)
BUN: 14 mg/dL (ref 6–20)
CO2: 20 mmol/L — ABNORMAL LOW (ref 22–32)
Calcium: 8.6 mg/dL — ABNORMAL LOW (ref 8.9–10.3)
Chloride: 111 mmol/L (ref 98–111)
Creatinine, Ser: 0.77 mg/dL (ref 0.61–1.24)
GFR calc Af Amer: >60 mL/min (ref >60)
GFR calc non Af Amer: >60 mL/min (ref >60)
Glucose, Bld: 75 mg/dL (ref 70–99)
Potassium: 3.8 mmol/L (ref 3.5–5.1)
Sodium: 138 mmol/L (ref 135–145)

## 2019-05-06 LAB — VITAMIN B12: Vitamin B-12: 196 pg/mL (ref 180–914)

## 2019-05-06 LAB — CBC
HCT: 31.3 % — ABNORMAL LOW (ref 39.0–52.0)
Hemoglobin: 10.8 g/dL — ABNORMAL LOW (ref 13.0–17.0)
MCH: 34.5 pg — ABNORMAL HIGH (ref 26.0–34.0)
MCHC: 34.5 g/dL (ref 30.0–36.0)
MCV: 100 fL (ref 80.0–100.0)
Platelets: 169 10*3/uL (ref 150–400)
RBC: 3.13 MIL/uL — ABNORMAL LOW (ref 4.22–5.81)
RDW: 13 % (ref 11.5–15.5)
WBC: 7.1 10*3/uL (ref 4.0–10.5)
nRBC: 0 % (ref 0.0–0.2)

## 2019-05-06 LAB — FOLATE: Folate: 9.9 ng/mL (ref >5.9)

## 2019-05-06 MED ORDER — VITAMIN B-12 1000 MCG PO TABS: 1000.0000 ug | ORAL_TABLET | Freq: Every day | ORAL | Status: DC

## 2019-05-06 MED ORDER — WARFARIN SODIUM 5 MG PO TABS: 7.5000 mg | ORAL_TABLET | Freq: Every day | ORAL | 2 refills | Status: AC

## 2019-05-06 MED ORDER — ASPIRIN 81 MG PO TBEC: 81.0000 mg | DELAYED_RELEASE_TABLET | Freq: Every day | ORAL | Status: DC

## 2019-05-06 MED ORDER — CYANOCOBALAMIN 1000 MCG PO TABS: 1000.0000 ug | ORAL_TABLET | Freq: Every day | ORAL | 2 refills | Status: AC

## 2019-05-06 MED ORDER — CYANOCOBALAMIN 1000 MCG/ML IJ SOLN
1000.0000 ug | Freq: Once | INTRAMUSCULAR | Status: AC
  Administered 2019-05-06: 1000 ug via INTRAMUSCULAR
  Filled 2019-05-06: qty 1

## 2019-05-06 NOTE — Evaluation (Signed)
Speech Language Pathology Evaluation Patient Details Name: Logan [name]. MRN: [id number] DOB: 1976-05-15 Today's Date: 05/06/2019 Time: 6160-7371 SLP Time Calculation (min) (ACUTE ONLY): 17 min  Problem List:  Patient Active Problem List   Diagnosis Date Noted  . Cerebrovascular accident (CVA) ([place]) 05/02/2019  . Middle cerebral artery embolism, left 05/02/2019  . Ischemic cardiomyopathy 05/02/2019  . Postprocedural hypotension 05/02/2019  . Anticoagulant long-term use 05/02/2019  . Subarachnoid hemorrhage ([place]) 05/02/2019  . Elevated troponin 05/02/2019  . Acute respiratory failure with hypoxia [facility])    Past Medical History:  Past Medical History:  Diagnosis Date  . Anemia   . Atrial fibrillation ([place])   . Stroke [facility])    Past Surgical History:  Past Surgical History:  Procedure Laterality Date  . Cardiac Defibrillator [facility] Left   . RADIOLOGY WITH ANESTHESIA N/A 05/02/2019   Procedure: IR WITH ANESTHESIA;  Surgeon: Luanne Bras, MD;  Location: [place];  Service: Radiology;  Laterality: N/A;   HPI:  Logan [name]. is a 43 y.o. 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 Cousin found him slumped over next to the door with his right arm flaccid and not speaking. CT angio showed L middle cerebral artery occlusion and 67 mL area of ischemic penumbra.TICI 2c revascularization was achieved. Post procedure CT of the brain revealed mild to moderate left perisylvian subarachnoid hemorrhage.   Pt intubated for arteriogram 10/19 2AM and is currently on vent [facility]).    Assessment / Plan / Recommendation Clinical Impression  Pt presents with expressive aphasia that is c/b nonfluent word finding deficits when conveying self-generated and  semi-complex information. Pt with semantic paraphasias that he has intermittent awareness of. Pt is able to accurately complete standard naming tasks and conveys accurate information when provided with yes/no questions. His receptive language abilities appear functional as he can follow 2 step directions. Would recommend follow up service thru Outpatient Venue. Pt is agreeable and states that he has transportation. ST to defer services to next venue of care.     SLP Assessment  SLP Recommendation/Assessment: All further Speech Lanaguage Pathology  needs can be addressed in the next venue of care SLP Visit Diagnosis: Aphasia (R47.01)    Follow Up Recommendations  Outpatient SLP    Frequency and Duration   N/A        SLP Evaluation Cognition  Overall Cognitive Status: Difficult to assess Arousal/Alertness: Awake/alert Orientation Level: Oriented X4 Attention: Selective Selective Attention: Appears intact Memory: Appears intact(Presented as yes/no questions, pt accurate) Awareness: Appears intact Problem Solving: Appears intact Safety/Judgment: Appears intact       Comprehension  Auditory Comprehension Overall Auditory Comprehension: Appears within functional limits for tasks assessed Yes/No Questions: Within Functional Limits Commands: Within Functional Limits Conversation: Simple Visual Recognition/Discrimination Discrimination: Within Function Limits Reading Comprehension Reading Status: Not tested    Expression Expression Primary Mode of Expression: Verbal Verbal Expression Overall Verbal Expression: Impaired Initiation: No impairment Automatic Speech: Name;Social Response Level of Generative/Spontaneous Verbalization: Sentence Repetition: No impairment Naming: Impairment Responsive: 76-100% accurate Confrontation: Within functional limits Convergent: 75-100% accurate Divergent: 50-74% accurate Verbal Errors: Semantic paraphasias;Not aware of errors;Aware of  errors Pragmatics: No impairment Non-Verbal Means of Communication: Not applicable Written Expression Dominant Hand: Right Written Expression: Not tested   Oral / Motor  Oral Motor/Sensory Function Overall Oral Motor/Sensory Function: Within functional limits Motor Speech Overall Motor Speech: Appears within functional limits for tasks assessed Respiration: Within functional limits Phonation: Normal Resonance: Within functional limits Articulation: Within functional limitis Intelligibility: Intelligible Motor Planning: Witnin functional limits Motor Speech Errors: Not applicable   GO                    Laurel Smeltz 05/06/2019, 11:48 AM

## 2019-05-06 NOTE — Plan of Care (Signed)
Patient stated he had a bowel movement without complication on 73/57/8978.

## 2019-05-06 NOTE — TOC Transition Note (Signed)
Transition of Care [facility]) - CM/SW Discharge Note   Patient Details  Name: Logan [name]. MRN: [id number] Date of Birth: 03-12-1976  Transition of Care [facility]) CM/SW Contact:  Pollie Friar, RN Phone Number: 05/06/2019, 1:00 PM   Clinical Narrative:    Pt discharging home with outpatient therapy. Pt chose to attend at [facility] since this is closer to his home.  Pt states his uncle can provide needed transportation and supervision. Pt has transportation home when medically ready.   Final next level of care: OP Rehab Barriers to Discharge: No Barriers Identified   Patient Goals and CMS Choice Patient states their goals for this hospitalization and ongoing recovery are:: Pt wants to feel better   Choice offered to / list presented to : NA  Discharge Placement                       Discharge Plan and Services In-house Referral: Clinical Social Work Discharge Planning Services: Other - See comment(Referral for outpatient therapy) Post Acute Care Choice: NA            DME Agency: NA       HH Arranged: NA HH Agency: NA        Social Determinants of Health (SDOH) Interventions     Readmission Risk Interventions Readmission Risk Prevention Plan 05/05/2019  Transportation Screening Complete  PCP or Specialist Appt within 5-7 Days Complete  Home Care Screening Not Complete  Home Care Screening Not Completed Comments NA  Medication Review (RN CM) Complete

## 2019-05-07 ENCOUNTER — Encounter (HOSPITAL_COMMUNITY): Payer: Self-pay | Admitting: Interventional Radiology

## 2019-05-07 ENCOUNTER — Other Ambulatory Visit: Payer: Self-pay

## 2019-05-07 ENCOUNTER — Ambulatory Visit (HOSPITAL_COMMUNITY): Payer: Medicare Other | Attending: Neurology | Admitting: Occupational Therapy

## 2019-05-07 ENCOUNTER — Encounter (HOSPITAL_COMMUNITY): Payer: Self-pay | Admitting: Internal Medicine

## 2019-05-07 DIAGNOSIS — I69318 Other symptoms and signs involving cognitive functions following cerebral infarction: Secondary | ICD-10-CM
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
## 2019-05-08 ENCOUNTER — Other Ambulatory Visit: Payer: Self-pay | Admitting: *Deleted

## 2019-05-08 ENCOUNTER — Ambulatory Visit (INDEPENDENT_AMBULATORY_CARE_PROVIDER_SITE_OTHER): Payer: Medicare Other | Admitting: *Deleted

## 2019-05-08 DIAGNOSIS — G459 Transient cerebral ischemic attack, unspecified: Secondary | ICD-10-CM

## 2019-05-08 DIAGNOSIS — I480 Paroxysmal atrial fibrillation: Secondary | ICD-10-CM | POA: Diagnosis not present

## 2019-05-08 DIAGNOSIS — I639 Cerebral infarction, unspecified: Secondary | ICD-10-CM

## 2019-05-08 DIAGNOSIS — Z5181 Encounter for therapeutic drug level monitoring: Secondary | ICD-10-CM | POA: Diagnosis not present

## 2019-05-08 LAB — POCT INR: INR: 1.4 — AB (ref 2.0–3.0)

## 2019-05-08 NOTE — Patient Instructions (Signed)
Pt has not picked up warfarin from the drug store yet so he has not had warfarin since hospital discharge on 05/06/19.  Is picking up today. Start warfarin 1 1/2 tablets (7.5mg ) daily tonight Recheck INR on Monday 05/13/19

## 2019-05-09 ENCOUNTER — Other Ambulatory Visit: Payer: Self-pay

## 2019-05-09 ENCOUNTER — Telehealth (HOSPITAL_COMMUNITY): Payer: Self-pay | Admitting: Licensed Clinical Social Worker

## 2019-05-09 ENCOUNTER — Ambulatory Visit (HOSPITAL_COMMUNITY)
Admission: RE | Admit: 2019-05-09 | Discharge: 2019-05-09 | Disposition: A | Discharge status: Home / Self Care | Payer: Medicare Other | Source: Ambulatory Visit | Attending: Internal Medicine | Admitting: Internal Medicine

## 2019-05-09 ENCOUNTER — Encounter (HOSPITAL_COMMUNITY): Payer: Self-pay | Admitting: Internal Medicine

## 2019-05-09 VITALS — BP 104/90 | HR 107 | Wt 151.8 lb

## 2019-05-09 DIAGNOSIS — Z7901 Long term (current) use of anticoagulants: Secondary | ICD-10-CM | POA: Diagnosis not present

## 2019-05-09 DIAGNOSIS — D649 Anemia, unspecified: Secondary | ICD-10-CM | POA: Insufficient documentation

## 2019-05-09 DIAGNOSIS — F1010 Alcohol abuse, uncomplicated: Secondary | ICD-10-CM | POA: Diagnosis not present

## 2019-05-09 DIAGNOSIS — I2582 Chronic total occlusion of coronary artery: Secondary | ICD-10-CM | POA: Insufficient documentation

## 2019-05-09 DIAGNOSIS — I255 Ischemic cardiomyopathy: Secondary | ICD-10-CM | POA: Diagnosis not present

## 2019-05-09 DIAGNOSIS — I480 Paroxysmal atrial fibrillation: Secondary | ICD-10-CM

## 2019-05-09 DIAGNOSIS — Z8673 Personal history of transient ischemic attack (TIA), and cerebral infarction without residual deficits: Secondary | ICD-10-CM | POA: Insufficient documentation

## 2019-05-09 DIAGNOSIS — I639 Cerebral infarction, unspecified: Secondary | ICD-10-CM | POA: Diagnosis not present

## 2019-05-09 DIAGNOSIS — R4701 Aphasia: Secondary | ICD-10-CM | POA: Diagnosis not present

## 2019-05-09 DIAGNOSIS — Z95810 Presence of automatic (implantable) cardiac defibrillator: Secondary | ICD-10-CM

## 2019-05-09 DIAGNOSIS — Z79899 Other long term (current) drug therapy: Secondary | ICD-10-CM | POA: Diagnosis not present

## 2019-05-09 DIAGNOSIS — I2510 Atherosclerotic heart disease of native coronary artery without angina pectoris: Secondary | ICD-10-CM

## 2019-05-09 DIAGNOSIS — I252 Old myocardial infarction: Secondary | ICD-10-CM | POA: Insufficient documentation

## 2019-05-09 DIAGNOSIS — Z951 Presence of aortocoronary bypass graft: Secondary | ICD-10-CM | POA: Insufficient documentation

## 2019-05-09 DIAGNOSIS — Z955 Presence of coronary angioplasty implant and graft: Secondary | ICD-10-CM | POA: Diagnosis not present

## 2019-05-09 DIAGNOSIS — Z7982 Long term (current) use of aspirin: Secondary | ICD-10-CM | POA: Diagnosis not present

## 2019-05-09 DIAGNOSIS — I447 Left bundle-branch block, unspecified: Secondary | ICD-10-CM | POA: Diagnosis not present

## 2019-05-09 DIAGNOSIS — I340 Nonrheumatic mitral (valve) insufficiency: Secondary | ICD-10-CM | POA: Diagnosis not present

## 2019-05-09 DIAGNOSIS — I5022 Chronic systolic (congestive) heart failure: Secondary | ICD-10-CM | POA: Diagnosis not present

## 2019-05-09 DIAGNOSIS — E785 Hyperlipidemia, unspecified: Secondary | ICD-10-CM | POA: Diagnosis not present

## 2019-05-09 DIAGNOSIS — F17210 Nicotine dependence, cigarettes, uncomplicated: Secondary | ICD-10-CM | POA: Insufficient documentation

## 2019-05-09 DIAGNOSIS — Z9114 Patient's other noncompliance with medication regimen: Secondary | ICD-10-CM | POA: Insufficient documentation

## 2019-05-09 MED ORDER — FUROSEMIDE 20 MG PO TABS: 20.0000 mg | ORAL_TABLET | Freq: Every day | ORAL | 6 refills | Status: AC

## 2019-05-09 MED ORDER — DIGOXIN 125 MCG PO TABS: 0.1250 mg | ORAL_TABLET | Freq: Every day | ORAL | 6 refills | Status: AC

## 2019-05-09 MED ORDER — SPIRONOLACTONE 25 MG PO TABS: 12.5000 mg | ORAL_TABLET | Freq: Every day | ORAL | 6 refills | Status: AC

## 2019-05-09 NOTE — Progress Notes (Signed)
Patient ID: Logan Penton., male   DOB: 08-11-1975, 43 y.o.   MRN: [id number]   ADVANCED HF CLINIC PATIENT NOTE  Patient ID: Logan Hanover., male   DOB: 1976-04-20, 43 y.o.   MRN: [id number] PCP: None   HPI:  Logan [name] is a 43 year old male with CAD s/p CABG x 3; LIMA-LAD, SVG -obtuse marginal, SVG-RCA (07/2012), ischemic cardiomyopathy- Hx of anterolateral MI with DES to LAD; reinfarction 02/05/12 while in [place], [place] with thrombosis of the stent and repeat stenting. He also has a history of nicotine dependence, previous ETOH abuse, multiple CVAs, PAF and non-compliance. We have not seen him in Clinic since August 2017.   Admitted early December 2016 with CP. Had myoview extensive scar with minimal peri-infarct ischemia. Areas of scar correlated with findings on his cardiac MRI in 2014. Troponin remained mildly elevated but did not demonstrate typical rise and fall as expected with ACS - more consistent with significant cardiomyopathy. EF not  calculated. Later discharged home.   Admitted 7/5-01/07/16  with TIA symptoms in the setting of stopping Xarelto for several days (has had the same thing in the past). Improved spontaneously.  CT unremarkable.  No MRI with ICD (has a [employer] that is MRI compatible, but radiology refused per patient). Needs to make follow up with stroke clinic.   Admitted last in 6/19 with recurrent TIA?CVA with LUE numbness.   He is s/p [employer] S-ICD  Just discharged on 05/06/19 after acute left MCA infarct with SAH. (Was on Xarelto) Treated emergently with thrombectomy. Echo EF 15-20% moderate to severe MR. Moderately decreased RV function. Switched back from Xarelto to warfarin (warfarin stopped previously due to noncompliance)  He is here for post-hospital f/u. Says overall doing ok. Strength is normal but still struggles with some expressive aphasia. Going to therapy several times a week. Getting around with a cane. Was supposed to get home O2 but hasn't got it  yet.  Denies CP, SOB, orthopnea, PND or edema. Living with his aunt/uncle in edema. Down to 1 cigarette/day. No ETOH. All HF meds stopped in hospital.   ICD interrogation in Clinic: Episode on 11/3 of tachycardia > 30sec with rates > 220. (No therapy due to presumed sinus tach vs SVT). No recurrence. Personally reviewed   CPX Results 06/2012 Peak VO2: 21.9 ml/kg/min  predicted peak VO2: 52.9% VE/VCO2 slope: 33.7 OUES: 1.53 Peak RER: 1.31 Ventilatory Threshold: 17.4 % predicted peak VO2: 42%   L/RHC 06/2015  Prox RCA lesion, 100% stenosed.  Ost LM to LM lesion, 20% stenosed.  2nd Mrg lesion, 100% stenosed.  SVG was injected is large.  The graft exhibits minimal luminal irregularities.  Mid LAD lesion, 40% stenosed.  Dist LAD lesion, 40% stenosed.  LIMA is small.  Insertion lesion, 70% stenosed.  SVG is very large.  The graft exhibits minimal luminal irregularities.   Findings:  RA = 2 RV = 27/05 PA = 19/2 (11) PCW = 6 Ao = 109/69 (86) LV = 115/8/20 Fick cardiac output/index = 3.9/2.1 SVR = 1719 PVR = 1.3 WU FA sat = 96% PA sat = 62%, 64%  Assessment: 1. Severe native 3v CAD with severe diffuse premature atherosclerosis 2. LIMA - LAD is very small due to competitive flow in native LAD. There is a bout a 70% narrowing at the insertion with the LAD 3. SVG - RPDA widely patent 4. SVR - OM2 widely patent 5. Normal hemodynamics with low filling pressures  L/RHC 07/2012 (prior to CABG) RA =  4  RV = 35/2/6  PA = 30/14 (20)  PCW = 15 v = 25  Fick cardiac output/index = 3.7/2.1  PVR = 1.4 [place]  FA sat = 97%  PA sat = 62%, 65%   Assessment:  1. 3 vessel disease with totally occluded RCA, severe disease in LCx, and 80% in-stent restenosis in LAD.  2. Low cardiac output, elevated filling pressures.   ECHO 04/2013: EF 20% with mural thrombus, grade I diastolic dysfunction, moderate RV dilation, moderate MR.  Limited ECHO (7/15): moderate LV dilation, EF  10-15%, restrictive diastolic function, there was an apical thrombus noted, moderate functional MR, moderately decreased RV systolic function.   Labs (7/15): K 4.4, creatinine 0.87, HCT 42.6 Labs (8/15): LDL 158, HDL 56 Labs 05/2014: K 4.1 Creatinine 0.87    SH: Married, lives in [place], 2 kids, on disability, smokes 1 ppd, +ETOH  FH: Brother with MI at 33, grandmother MI at 29, mother SCD  ROS: All systems negative except as listed in HPI, PMH and Problem List.  Past Medical History:  Diagnosis Date  . Anemia   . Anginal pain (HCC)   . Atrial fibrillation (HCC)   . CAD (coronary artery disease) 01/22/12   a.anterolateral MI with DES to LAD EF 25% b. reinfarction 8/13 with thrombosis of stent c. s/p CABG x 3: LIMA-LAD, SVG-OM, SVG-RCA (07/2012)   . Chronic systolic CHF (congestive heart failure) (HCC)    a. (07/2012) EF 20-25% diff HK with reg wall abnl, HK anterolat wall, akinesis inferosep, mod MR;  b. 01/2014 Echo: EF 10-15%, glob HK, mod MR.  Marland Kitchen GERD (gastroesophageal reflux disease)   . Hyperlipidemia   . Ischemic cardiomyopathy    a.  01/2014 Echo: EF 10-15%, glob HK;  b. 03/2014 S/P Subcutaneous ICD.  . LV (left ventricular) mural thrombus    "just found it in Oct" (07/09/2012)  . Moderate mitral regurgitation   . Myocardial infarction (HCC) 01/2012; 02/2012; 04/2012  . Noncompliance   . Stroke [facility]) 04/2014   TIA in the setting of medical non-compliance with coumadin requiring hospitalization.  . Stroke (HCC)   . Syncope   . Tobacco abuse     Current Outpatient Medications  Medication Sig Dispense Refill  . aspirin EC 81 MG tablet Take 1 tablet (81 mg total) by mouth daily. 90 tablet 3  . atorvastatin (LIPITOR) 80 MG tablet Take 1 tablet (80 mg total) by mouth daily at 6 PM. 90 tablet 3  . vitamin B-12 1000 MCG tablet Take 1 tablet (1,000 mcg total) by mouth daily. 30 tablet 2  . warfarin (COUMADIN) 5 MG tablet Take 1.5 tablets (7.5 mg total) by mouth daily. Take 7.5 md  aily at 6pm until blood work is drawn. Adjust dose based on coumadin clinic direction. 60 tablet 2   No current facility-administered medications for this encounter.     Vitals:   05/09/19 1513  BP: 104/90  Pulse: (!) 107  SpO2: 100%  Weight: 68.9 kg (151 lb 12.8 oz)    PHYSICAL EXAM: General:  Walked into clinic with cane No resp difficulty HEENT: normal Neck: supple. JVP to jaw Carotids 2+ bilat; no bruits. No lymphadenopathy or thryomegaly appreciated. Cor: PMI lateral displaced. Regular tachy + s3 Lungs: clear Abdomen: soft, nontender, nondistended. No hepatosplenomegaly. No bruits or masses. Good bowel sounds. Extremities: no cyanosis, clubbing, rash, edema Neuro: alert & orientedx3 mild expressive aphasia, cranial nerves grossly intact. moves all 4 extremities w/o difficulty. Affect pleasant  ASSESSMENT & PLAN:  1) Chronic systolic HF: Echo (45/99) with EF 10-15%. Ischemic cardiomyopathy, not much improvement in LV function post-CABG.  Has Subcutaneous Environmental education officer.  - echo 05/02/19 EF 15% RV moderately down  - NYHA class III symptoms in setting ofrecent stroke - Volume status  elevated - Off all HF meds in setting of low BP with recent CVA - Restart Lasix 20mg  daily - Restart spiro 12.5mg  daily - Start digoxin 0.125 daily  - He has severe biventricular dysfunction. Prognosis quite concerning. Has struggled with severe non-compliance so not candidate for advanced therapies.  - Given LBBB would ideally also have CRT but currently not candidate - Will see back in several weeks  2)CAD:  - s/p CABG x 3 in 1/14. - No current s/s ischemia - LHC 06/2015 with severe native disease. Patent SVG (RPDA) and SVR (OM2).  LIMA is very small due to competitive flow in native LAD. There is a bout a 70% narrowing at the insertion with the LAD - Continue atorvastatin 80 mg daily,,warfarin and ASA  3) Mural Thrombus:   - back on coumadin with recent CVA (switched from  Xarelto)  4) ETOH abuse: - has been abstinent x 2 weeks  5) Current Smoker:  - Has cut back to 1 cigs/day - Encouraged to stop all together.    6) Recurrent TIA/CVA- 05/2014 and 01/2016, 12/19, 10/20 - Continue statin, ASA/ warfarin and rehab  7) PAF - In NSR. This patients CHA2DS2-VASc Score and unadjusted Ischemic Stroke Rate (% per year) is equal to 4.8 % stroke rate/year from a score of 4  Continue warfarin  8) LBBB  - see above  Was seen by SW in Clinic with me and Paramedicine f/u arranged  Total time spent 40 minutes. Over half that time spent discussing above.    Benay Spice 3:34 PM

## 2019-05-09 NOTE — Telephone Encounter (Signed)
CSW met with pt in clinic to fill out [employer] paperwork.  Paperwork reviewed and signed and referral sent to [facility] program who will follow up with patient regarding referral.  CSW will continue to follow and assist as needed  Jorge Ny, [place] [facility] Desk#: [phone] Cell#: [phone]

## 2019-05-09 NOTE — Patient Instructions (Signed)
Start Furosemide 20 mg daily  Start Spironolactone 12.5 mg (1/2 tab) daily  Start Digoxin 0.125 mg daily  Keep follow up appointments as scheduled  If you have any questions or concerns before your next appointment please send [place] a message through [place] or call our office at [phone].  At the [facility], you and your health needs are our priority. As part of our continuing mission to provide you with exceptional heart care, we have created designated Provider Care Teams. These Care Teams include your primary Cardiologist (physician) and Advanced Practice Providers (APPs- Physician Assistants and Nurse Practitioners) who all work together to provide you with the care you need, when you need it.   You may see any of the following providers on your designated Care Team at your next follow up: Marland Kitchen Dr Glori Bickers . Dr Loralie Champagne . Darrick Grinder, NP . Lyda Jester, PA   Please be sure to bring in all your medications bottles to every appointment.

## 2019-05-14 ENCOUNTER — Telehealth (HOSPITAL_COMMUNITY): Payer: Self-pay | Admitting: *Deleted

## 2019-05-14 ENCOUNTER — Ambulatory Visit (HOSPITAL_COMMUNITY): Payer: Medicare Other | Admitting: Speech Pathology

## 2019-05-14 NOTE — Telephone Encounter (Signed)
Per Dr Haroldine Laws he received a call from MD at [facility], pt was taken there on 11/6 with acute MI and did not make it.  Per Dr Haroldine Laws he is ok to sign death certificate if they want him too.  Pt marked deceased in chart.

## 2019-05-16 ENCOUNTER — Other Ambulatory Visit (HOSPITAL_COMMUNITY): Payer: Medicare Other

## 2019-05-16 ENCOUNTER — Encounter (HOSPITAL_COMMUNITY): Payer: Medicare Other

## 2019-05-17 ENCOUNTER — Encounter (HOSPITAL_COMMUNITY): Payer: Medicare Other | Admitting: Specialist

## 2019-05-17 ENCOUNTER — Encounter (HOSPITAL_COMMUNITY): Payer: Self-pay | Admitting: *Deleted

## 2019-05-17 NOTE — Progress Notes (Signed)
DC completed and signed by Dr Haroldine Laws, faxed copy to [employer], they are aware and will p/u original today

## 2019-05-21 ENCOUNTER — Encounter (HOSPITAL_COMMUNITY): Payer: Medicare Other | Admitting: Occupational Therapy

## 2019-05-21 ENCOUNTER — Ambulatory Visit (HOSPITAL_COMMUNITY): Payer: Medicare Other | Admitting: Speech Pathology

## 2019-05-23 ENCOUNTER — Encounter (HOSPITAL_COMMUNITY): Payer: Medicare Other

## 2019-05-27 ENCOUNTER — Encounter (HOSPITAL_COMMUNITY): Payer: Medicare Other | Admitting: Occupational Therapy

## 2019-05-28 ENCOUNTER — Encounter (HOSPITAL_COMMUNITY): Payer: Medicare Other | Admitting: Internal Medicine

## 2019-05-28 ENCOUNTER — Encounter (HOSPITAL_COMMUNITY): Payer: Medicare Other | Admitting: Occupational Therapy

## 2019-06-04 DEATH — deceased
